# Patient Record
Sex: Male | Born: 1965
Health system: Southern US, Community
[De-identification: ages and names within clinical notes are randomized; demographics above are authoritative.]

## PROBLEM LIST (undated history)

## (undated) DIAGNOSIS — R609 Edema, unspecified: Secondary | ICD-10-CM

## (undated) DIAGNOSIS — C4491 Basal cell carcinoma of skin, unspecified: Secondary | ICD-10-CM

## (undated) DIAGNOSIS — IMO0002 Reserved for concepts with insufficient information to code with codable children: Secondary | ICD-10-CM

## (undated) DIAGNOSIS — E119 Type 2 diabetes mellitus without complications: Secondary | ICD-10-CM

## (undated) DIAGNOSIS — I10 Essential (primary) hypertension: Secondary | ICD-10-CM

## (undated) HISTORY — DX: Basal cell carcinoma of skin, unspecified: C44.91

## (undated) HISTORY — DX: Essential (primary) hypertension: I10

## (undated) HISTORY — DX: Edema, unspecified: R60.9

## (undated) HISTORY — PX: OTHER SURGICAL HISTORY: SHX169

## (undated) HISTORY — DX: Reserved for concepts with insufficient information to code with codable children: IMO0002

---

## 2008-02-15 ENCOUNTER — Ambulatory Visit: Payer: Self-pay | Admitting: Internal Medicine

## 2008-11-26 ENCOUNTER — Ambulatory Visit: Payer: Self-pay | Admitting: Internal Medicine

## 2009-05-30 ENCOUNTER — Ambulatory Visit: Payer: Self-pay | Admitting: Internal Medicine

## 2009-10-03 ENCOUNTER — Ambulatory Visit: Payer: Self-pay | Admitting: Internal Medicine

## 2010-11-09 ENCOUNTER — Encounter: Payer: Self-pay | Admitting: Internal Medicine

## 2010-11-09 ENCOUNTER — Ambulatory Visit (INDEPENDENT_AMBULATORY_CARE_PROVIDER_SITE_OTHER): Payer: Commercial Managed Care - PPO | Admitting: Internal Medicine

## 2010-11-09 DIAGNOSIS — I119 Hypertensive heart disease without heart failure: Secondary | ICD-10-CM | POA: Insufficient documentation

## 2010-11-09 DIAGNOSIS — I10 Essential (primary) hypertension: Secondary | ICD-10-CM

## 2010-11-09 DIAGNOSIS — R079 Chest pain, unspecified: Secondary | ICD-10-CM

## 2010-11-09 DIAGNOSIS — R609 Edema, unspecified: Secondary | ICD-10-CM

## 2010-11-09 NOTE — Progress Notes (Signed)
Subjective:    Patient ID: Kyle Dawson, male    DOB: 05-18-1965, 45 y.o.   MRN: [id number]  HPI  pleasant 45 year old white male with strong family history of coronary disease in today with complaint of chest pain. 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 Has noticed some tenderness in left parasternal area. Seemed to resolve after a few minutes. No radiation to neck or down or. Says it does hurt some in left scapula. Some diaphoresis with these attacks. Last night was awakened with an attack of chest pain. Says he was diaphoretic without attack as well. No prior history of GE reflux. Has been having considerable lower extremity edema of the past few months has been wearing compression stockings. History of hypertension diagnosed 1995. Recovering alcohol and drug addict. A Cardiolite exam 2007 at [facility] which was negative. Currently takes been benazepril 40 mg daily and Norvasc 10 mg daily. Blood pressure under good control with this regimen. Suspect hot weather, dose of Norvasc, and standing on his feet about 70 hours a week have aggravated the dependent edema. He's been having edema for a few months. 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. Although he's doing well in school, I think there is some  anxiety associated with his rotations. Has about another year left before he gets his diploma for [employer] Recently has been told he has to go on to school and get a bachelors degree if he wants to work for  [employer] system. Denies water brash or reflux symptoms. Says he snores a lot and suspects he has sleep apnea. Does not want to do sleep study at this point in time. Says he has gained weight over the past year. Previous weight here May 2011 was 225 pounds so he's gained 13 pounds in the past year.  Fasting lab work done June 2011 was within normal limits with the exception of triglycerides of 176 and an LDL cholesterol of 108. BUN and creatinine normal at that time. Had another episode of chest pain today while in school. Pain was substernal in nature. Had diaphoresis associated with it. Lasted some 5-10 minutes. Spontaneously resolved.    Review of Systems     Objective:   Physical Exam  Skin: warm and dry; Nodes: none; Neck: supple, no thyromegaly, no bruits, no adenopathy. Tender left parasternal area along adjacent rib. Abd: no hepatosplenomegaly masses or tenderness. Ext:  2+ dorsalis pedis pulses,  trace to 1+ pitting edema both lower extremities. EKG shows no acute changes from EKG taken in this office in 2011 but considerable artifact.        Assessment & Plan:  Chest pain-suspect element of chest wall pain but with strong family history cannot exclude occult coronary artery disease. Had negative Cardiolite study in [facility] 2007. Report on file in the office. Refer to cardiologist for evaluation since she's had 3 episodes in the past 24 hours. Pain awakened him from sleep last night.? GE reflux. Patient does not want to start PPI at this point in time prefers to wait for cardiology evaluation. He is to have fasting lab work including CBC C-met, hemoglobin A1c, fasting lipid panel  Hypertension control has been good no benazepril and Norvasc. However with increased time spent on his feet he has developed considerable dependent edema. 10  mg dose of Norvasc may well be aggravating the fluid retention. Hot weather could be causing edema issues in addition to his diet.Admits to not eating healthy recently while in school.  Dependent edema-discussed above start Lasix 20 mg daily. Recheck potassium one week after starting Lasix. May need to be increased to 40 mg daily. May need to be taken off of Norvasc entirely and placed on something else do to edema issues.  Await cardiology evaluation and recommendations.  Anxiety about school and chest pain. Does not want anti-anxiety medication.

## 2010-11-09 NOTE — Patient Instructions (Signed)
Please start Lasix tomorrow as corrected 20 mg daily in addition to Norvasc and benazepril. We will arrange cardiology consultation for you. Please try to take it easy. Understanding will have a break from school after next week for 5 weeks. Please try to improve diet. Please try to keep feet elevated as much as  possible. Fasting blood work has been scheduled.

## 2010-11-10 ENCOUNTER — Telehealth: Payer: Self-pay | Admitting: *Deleted

## 2010-11-10 NOTE — Telephone Encounter (Signed)
[place] regarding a possible appointment this week for a Cardiologist to evaluate recent episodes of chest pain.  Records faxed over to [facility] for Cardiologist to evaluate.  Their office called back and stated this was not an urgent matter and they would call the patient to set up an appointment later this month.  MD aware.

## 2010-11-12 ENCOUNTER — Other Ambulatory Visit: Payer: Self-pay | Admitting: Internal Medicine

## 2010-11-12 ENCOUNTER — Other Ambulatory Visit: Payer: Commercial Managed Care - PPO | Admitting: Internal Medicine

## 2010-11-12 DIAGNOSIS — R5383 Other fatigue: Secondary | ICD-10-CM

## 2010-11-12 DIAGNOSIS — I871 Compression of vein: Secondary | ICD-10-CM

## 2010-11-12 DIAGNOSIS — R079 Chest pain, unspecified: Secondary | ICD-10-CM

## 2010-11-12 DIAGNOSIS — I10 Essential (primary) hypertension: Secondary | ICD-10-CM

## 2010-11-12 DIAGNOSIS — R5381 Other malaise: Secondary | ICD-10-CM

## 2010-11-13 ENCOUNTER — Telehealth: Payer: Self-pay | Admitting: Internal Medicine

## 2010-11-13 ENCOUNTER — Encounter: Payer: Self-pay | Admitting: Internal Medicine

## 2010-11-13 LAB — T4, FREE: Free T4: 1.3 ng/dL (ref 0.80–1.80)

## 2010-11-13 LAB — CBC WITH DIFFERENTIAL/PLATELET
Basophils Absolute: 0 10*3/uL (ref 0.0–0.1)
Basophils Relative: 1 % (ref 0–1)
HCT: 45.1 % (ref 39.0–52.0)
MCHC: 33.9 g/dL (ref 30.0–36.0)
Monocytes Absolute: 0.6 10*3/uL (ref 0.1–1.0)
Neutro Abs: 3.8 10*3/uL (ref 1.7–7.7)
Platelets: 251 10*3/uL (ref 150–400)
RDW: 12.7 % (ref 11.5–15.5)

## 2010-11-13 NOTE — Telephone Encounter (Signed)
Pt will call to schedule return appt in 1 month.

## 2010-11-17 ENCOUNTER — Other Ambulatory Visit: Payer: Commercial Managed Care - PPO | Admitting: Internal Medicine

## 2010-11-18 ENCOUNTER — Encounter: Payer: Self-pay | Admitting: Cardiology

## 2010-11-19 ENCOUNTER — Ambulatory Visit: Payer: Commercial Managed Care - PPO | Admitting: Cardiology

## 2010-11-19 ENCOUNTER — Encounter: Payer: Self-pay | Admitting: Cardiology

## 2010-11-19 ENCOUNTER — Ambulatory Visit (INDEPENDENT_AMBULATORY_CARE_PROVIDER_SITE_OTHER): Payer: Commercial Managed Care - PPO | Admitting: Cardiology

## 2010-11-19 VITALS — BP 140/90 | HR 72 | Resp 16 | Ht 70.0 in | Wt 251.0 lb

## 2010-11-19 DIAGNOSIS — R079 Chest pain, unspecified: Secondary | ICD-10-CM

## 2010-11-19 DIAGNOSIS — E663 Overweight: Secondary | ICD-10-CM

## 2010-11-19 DIAGNOSIS — I10 Essential (primary) hypertension: Secondary | ICD-10-CM

## 2010-11-19 DIAGNOSIS — I2510 Atherosclerotic heart disease of native coronary artery without angina pectoris: Secondary | ICD-10-CM

## 2010-11-19 DIAGNOSIS — R609 Edema, unspecified: Secondary | ICD-10-CM

## 2010-11-19 NOTE — Patient Instructions (Signed)
Your physician has requested that you have an exercise tolerance test. For further information please visit [email or web address]. Please also follow instruction sheet, as given.  Please continue all medications as listed.

## 2010-11-19 NOTE — Progress Notes (Signed)
HPI The patient presents for evaluation of chest discomfort. He has a history of chest discomfort and did have a stress perfusion study in 1997 that demonstrated no evidence of ischemia or infarct.  He reports chest discomfort occurring for about one year. It happens sporadically. It may happen several times over 2-3 days and then disappear. It may last for 5 minutes. It may be 8/10 in intensity.  It is substernal and radiates around under the left breast.  It comes and goes spontaneously. It is not made worse by activity and he cannot bring it on. He does get some diaphoresis with it. He does state it is happening more frequently. However, he can exert himself significantly without this discomfort.  Allergies  Allergen Reactions  . Oxycodone     Current Outpatient Prescriptions  Medication Sig Dispense Refill  . 5-Hydroxytryptophan (5-HTP PO) Take by mouth daily.        . ALPHA-LIPOIC ACID PO Take by mouth daily.        Marland Kitchen amLODipine (NORVASC) 10 MG tablet Take 10 mg by mouth daily.        Marland Kitchen aspirin 81 MG tablet Take 81 mg by mouth daily.        . B Complex-C (B-50 COMPLEX/VITAMIN C) TABS Take 2 tablets by mouth 2 (two) times daily.        . benazepril (LOTENSIN) 40 MG tablet Take 40 mg by mouth daily.        . Calcium Carbonate-Vit D-Min (CALCIUM 1200 PO) Take by mouth daily.        . cholecalciferol (VITAMIN D) 1000 UNITS tablet Take 1,000 Units by mouth daily.        Marland Kitchen co-enzyme Q-10 30 MG capsule Take 100 mg by mouth 2 (two) times daily.        . fish oil-omega-3 fatty acids 1000 MG capsule Take 2 g by mouth daily.        . furosemide (LASIX) 20 MG tablet Take 20 mg by mouth daily.        Marland Kitchen L-LYSINE PO Take by mouth 2 (two) times daily.        . LevOCARNitine (CARNITINE PO) Take by mouth 2 (two) times daily.        . Lutein 20 MG TABS Take by mouth daily.        . Saw Palmetto, Serenoa repens, 450 MG CAPS Take by mouth daily.        . Selenium 100 MCG CAPS Take by mouth daily.        Marland Kitchen  VITAMIN A PO Take by mouth daily.        . vitamin E 400 UNIT capsule Take 400 Units by mouth daily.        . Zinc 50 MG TABS Take 2 tablets by mouth 2 (two) times daily.          Past Medical History  Diagnosis Date  . Hypertension   . Basal cell carcinoma   . Squamous cell carcinoma     R ear, nose, each side of face  . Chest pain   . Edema     lower extremity    Past Surgical History  Procedure Date  . Removal basal cell carcinoma   . Removal squamous cell carcinoma     Family History  Problem Relation Age of Onset  . Peripheral vascular disease Mother 75  . Hypertension Mother     History   Social History  . Marital  Status: Single    Spouse Name: N/A    Number of Children: N/A  . Years of Education: N/A   Occupational History  . [employer]   Social History Main Topics  . Smoking status: Never Smoker   . Smokeless tobacco: Not on file  . Alcohol Use: No     Quit drinking 2007  . Drug Use: Not on file  . Sexually Active: Not on file   Other Topics Concern  . Not on file   Social History Narrative  . No narrative on file    ROS: GERD.  Otherwise as stated in the HPI and negative for all other systems.   PHYSICAL EXAM BP 140/90  Pulse 72  Resp 16  Ht 5' 10"  (1.778 m)  Wt 251 lb (113.853 kg)  BMI 36.01 kg/m2 GENERAL:  Well appearing HEENT:  Pupils equal round and reactive, fundi not visualized, oral mucosa unremarkable NECK:  No jugular venous distention, waveform within normal limits, carotid upstroke brisk and symmetric, no bruits, no thyromegaly LYMPHATICS:  No cervical, inguinal adenopathy LUNGS:  Clear to auscultation bilaterally BACK:  No CVA tenderness CHEST:  Unremarkable HEART:  PMI not displaced or sustained,S1 and S2 within normal limits, no S3, no S4, no clicks, no rubs, no murmurs ABD:  Flat, positive bowel sounds normal in frequency in pitch, no bruits, no rebound, no guarding, no midline pulsatile mass, no hepatomegaly, no  splenomegaly EXT:  2 plus pulses throughout, no edema, no cyanosis no clubbing SKIN:  No rashes no nodules NEURO:  Cranial nerves II through XII grossly intact, motor grossly intact throughout [facility]:  Cognitively intact, oriented to person place and time   EKG:  11/09/10  Sinus rhythm, rate 71, axis within normal limits, intervals within normal limits, early transition in lead V2, no acute ST-T wave changes  ASSESSMENT AND PLAN

## 2010-11-19 NOTE — Assessment & Plan Note (Signed)
The patient's chest pain has atypical greater than typical features. However, he does have some risk factors. At this point I will bring him back in exercise treadmill test. A POET (Plain Old Exercise Test) will allow me to screen for obstructive coronary disease, risk stratify and very importantly provide a prescription for exercise.

## 2010-11-19 NOTE — Assessment & Plan Note (Signed)
The blood pressure is at target. No change in medications is indicated. We will continue with therapeutic lifestyle changes (TLC).  

## 2010-11-19 NOTE — Assessment & Plan Note (Signed)
We discussed conservative therapy for management of this.

## 2010-11-19 NOTE — Assessment & Plan Note (Signed)
He has lost some weight and I applaud this and encourage more of the same.

## 2010-12-14 ENCOUNTER — Ambulatory Visit: Payer: Commercial Managed Care - PPO | Admitting: Internal Medicine

## 2010-12-15 ENCOUNTER — Encounter: Payer: Self-pay | Admitting: Internal Medicine

## 2010-12-15 ENCOUNTER — Ambulatory Visit (INDEPENDENT_AMBULATORY_CARE_PROVIDER_SITE_OTHER): Payer: Commercial Managed Care - PPO | Admitting: Internal Medicine

## 2010-12-15 VITALS — BP 122/70 | HR 72 | Temp 97.6°F | Ht 70.51 in | Wt 238.0 lb

## 2010-12-15 DIAGNOSIS — R0789 Other chest pain: Secondary | ICD-10-CM

## 2010-12-15 DIAGNOSIS — R609 Edema, unspecified: Secondary | ICD-10-CM

## 2010-12-15 DIAGNOSIS — I10 Essential (primary) hypertension: Secondary | ICD-10-CM

## 2010-12-15 NOTE — Progress Notes (Signed)
  Subjective:    Patient ID: Kyle Dawson, male    DOB: 23-Aug-1965, 45 y.o.   MRN: [id number]  HPI  45 year old white male with history of hypertension and dependent edema for followup of atypical chest pain and edema. He's been wearing some compression stockings and his edema has improved considerably. 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 He still works a lot of hours at [employer] however. He is scheduled to have a stress test at [facility] next week. Since I saw him he had an episode of chest 2 or 3 weeks ago sitting at home working on the computer. Described the pain as substernal burning radiating  just under left breast into his left axilla. Pain was not associated with waterbrash or burping. No diaphoresis. He got up and went to the shower. Says shower usually makes him feel better. He says the pain lasted for about 4 minutes and resolved. No further episodes. He's not taking any medication for reflux. Blood pressures under adequate control. Seems less stressed than when I saw him initially for chest pain. He needs to continue to diet exercise and lose weight. This will be difficult while trying to work full-time and going to school    Review of Systems     Objective:   Physical Exam  chest clear to auscultation; cardiac exam regular rate and rhythm, normal S1 and S2; extremities trace lower extremity edema        Assessment & Plan:  Hypertension  Atypical chest pain  Patient is to return here in 6 months for office visit and blood pressure check with followup on edema. If edema gets worse once school starts again, he is to contact me. Encouraged diet exercise and weight loss. Avoid fast food during school which was a problem last semester.

## 2010-12-22 ENCOUNTER — Ambulatory Visit (INDEPENDENT_AMBULATORY_CARE_PROVIDER_SITE_OTHER): Payer: Commercial Managed Care - PPO | Admitting: Cardiology

## 2010-12-22 ENCOUNTER — Encounter: Payer: Commercial Managed Care - PPO | Admitting: Cardiology

## 2010-12-22 DIAGNOSIS — R079 Chest pain, unspecified: Secondary | ICD-10-CM

## 2010-12-22 NOTE — Progress Notes (Signed)
Exercise Treadmill Test  Pre-Exercise Testing Evaluation Rhythm: normal sinus  Rate: 90   PR:  .12 QRS:  .09  QT:  .36 QTc: .45     Test  Exercise Tolerance Test Ordering MD: Marijo File, MD  Interpreting MD:  Marijo File, MD  Unique Test No: 1  Treadmill:  1  Indication for ETT: chest pain - rule out ischemia  Contraindication to ETT: No   Stress Modality: exercise - treadmill  Cardiac Imaging Performed: non   Protocol: standard Bruce - maximal  Max BP:  205/93  Max MPHR (bpm):  173 85% MPR (bpm):  147  MPHR obtained (bpm): 172 % MPHR obtained: 99  Reached 85% MPHR (min:sec):  5:09 Total Exercise Time (min-sec): 8:00  Workload in METS:  10.4 Borg Scale:15  Reason ETT Terminated:  desired heart rate attained    ST Segment Analysis At Rest: normal ST segments - no evidence of significant ST depression With Exercise: no evidence of significant ST depression  Other Information Arrhythmia:  Yes Angina during ETT:  absent (0) Quality of ETT:  diagnostic  ETT Interpretation:  normal - no evidence of ischemia by ST analysis  Comments: The patient had a moderate exercise tolerance.  There was no chest pain.  There was an appropriate level of dyspnea.  There was normal heart rate response and normal BP response.  He had PACs. There were no ischemic ST T wave changes. He had an abnormal heart rate recovery.  Recommendations: Negative adequate ETT.  No further testing is indicated.  Based on the above I gave the patient a prescription for exercise.

## 2011-01-08 ENCOUNTER — Other Ambulatory Visit: Payer: Self-pay | Admitting: Internal Medicine

## 2011-06-21 ENCOUNTER — Ambulatory Visit (INDEPENDENT_AMBULATORY_CARE_PROVIDER_SITE_OTHER): Payer: 59 | Admitting: Internal Medicine

## 2011-06-21 ENCOUNTER — Encounter: Payer: Self-pay | Admitting: Internal Medicine

## 2011-06-21 VITALS — BP 136/92 | HR 72 | Temp 98.0°F | Wt 242.0 lb

## 2011-06-21 DIAGNOSIS — I10 Essential (primary) hypertension: Secondary | ICD-10-CM

## 2011-06-21 DIAGNOSIS — Z85828 Personal history of other malignant neoplasm of skin: Secondary | ICD-10-CM

## 2011-06-21 DIAGNOSIS — Z872 Personal history of diseases of the skin and subcutaneous tissue: Secondary | ICD-10-CM

## 2011-06-21 DIAGNOSIS — Z86018 Personal history of other benign neoplasm: Secondary | ICD-10-CM

## 2011-06-21 LAB — BASIC METABOLIC PANEL
CO2: 27 mEq/L (ref 19–32)
Calcium: 10.2 mg/dL (ref 8.4–10.5)
Sodium: 139 mEq/L (ref 135–145)

## 2011-07-10 ENCOUNTER — Encounter: Payer: Self-pay | Admitting: Internal Medicine

## 2011-07-10 DIAGNOSIS — Z85828 Personal history of other malignant neoplasm of skin: Secondary | ICD-10-CM | POA: Insufficient documentation

## 2011-07-10 DIAGNOSIS — Z86018 Personal history of other benign neoplasm: Secondary | ICD-10-CM | POA: Insufficient documentation

## 2011-07-10 NOTE — Progress Notes (Signed)
  Subjective:    Patient ID: Kyle Dawson, male    DOB: [birth year], 46 y.o.   MRN: [id number]  HPI 46 year old white male with history of hypertension treated with benazepril and Norvasc. He is doing very well. Less dependent edema that he had several months ago. Has tried to eat healthy and watch salt intake. Was seen by Dr. Percival Spanish July 2012 for evaluation of chest pain which was thought to be atypical. This is a six-month recheck appointment. Patient quit drinking alcohol in 2007. Says he's recovering from alcoholism and drug use. 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 History of squamous cell carcinoma right ear, history of basal cell carcinoma back and nose. Was diagnosed in 1995 with hypertension. Had Cardiolite study 2007. Last tetanus immunization April 2010. 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  In 2008 Dr. Allyn Kenner removed a junctional and lentiginous dysplastic nevus with moderate to severe atypia from his back.  Gets annual influenza immunization through the hospital. Had negative PPD March 2011. Had TD at vaccine through employee health 08/23/2008. Positive MMR titer. Received aerosol vaccine April 2011. Positive hep B titer April 2010.    Review of Systems     Objective:   Physical Exam  chest clear to auscultation; cardiac exam regular rate and rhythm; trace lower extremity edema. Neck is supple without JVD thyromegaly or carotid bruits.       Assessment & Plan:  Hypertension  History of basal cell carcinoma on back and 2 places on nose  History of squamous cell carcinoma right ear  History of dysplastic nevus on back  History of dependent edema  Plan: Patient is to return in 6 months for physical examination. Continue same medications.

## 2011-07-10 NOTE — Patient Instructions (Signed)
Continue same medications. Return in 6 months for physical exam.

## 2011-12-31 ENCOUNTER — Other Ambulatory Visit: Payer: Self-pay | Admitting: Internal Medicine

## 2011-12-31 ENCOUNTER — Other Ambulatory Visit: Payer: Self-pay

## 2011-12-31 MED ORDER — BENAZEPRIL HCL 40 MG PO TABS: 40.0000 mg | ORAL_TABLET | Freq: Every day | ORAL | Status: DC

## 2011-12-31 MED ORDER — AMLODIPINE BESYLATE 10 MG PO TABS: 10.0000 mg | ORAL_TABLET | Freq: Every day | ORAL | Status: DC

## 2011-12-31 NOTE — Telephone Encounter (Signed)
When is he due for Appt?

## 2012-01-14 ENCOUNTER — Other Ambulatory Visit: Payer: 59 | Admitting: Internal Medicine

## 2012-01-17 ENCOUNTER — Encounter: Payer: 59 | Admitting: Internal Medicine

## 2012-02-17 ENCOUNTER — Other Ambulatory Visit: Payer: 59 | Admitting: Internal Medicine

## 2012-02-17 ENCOUNTER — Encounter: Payer: Self-pay | Admitting: Internal Medicine

## 2012-02-17 ENCOUNTER — Ambulatory Visit (INDEPENDENT_AMBULATORY_CARE_PROVIDER_SITE_OTHER): Payer: 59 | Admitting: Internal Medicine

## 2012-02-17 VITALS — BP 124/72 | HR 80 | Temp 98.4°F | Ht 69.25 in | Wt 237.0 lb

## 2012-02-17 DIAGNOSIS — I10 Essential (primary) hypertension: Secondary | ICD-10-CM

## 2012-02-17 DIAGNOSIS — E669 Obesity, unspecified: Secondary | ICD-10-CM

## 2012-02-17 DIAGNOSIS — R609 Edema, unspecified: Secondary | ICD-10-CM

## 2012-02-17 LAB — CBC WITH DIFFERENTIAL/PLATELET
HCT: 46.8 % (ref 39.0–52.0)
Hemoglobin: 16.6 g/dL (ref 13.0–17.0)
Lymphocytes Relative: 25 % (ref 12–46)
Lymphs Abs: 1.6 10*3/uL (ref 0.7–4.0)
MCHC: 35.5 g/dL (ref 30.0–36.0)
Monocytes Absolute: 0.6 10*3/uL (ref 0.1–1.0)
Monocytes Relative: 8 % (ref 3–12)
Neutro Abs: 4.3 10*3/uL (ref 1.7–7.7)
Neutrophils Relative %: 64 % (ref 43–77)
RBC: 5.3 MIL/uL (ref 4.22–5.81)
WBC: 6.7 10*3/uL (ref 4.0–10.5)

## 2012-02-17 LAB — LIPID PANEL
HDL: 47 mg/dL (ref >39)
LDL Cholesterol: 109 mg/dL — ABNORMAL HIGH (ref 0–99)
Triglycerides: 110 mg/dL (ref <150)

## 2012-02-17 LAB — COMPREHENSIVE METABOLIC PANEL
Albumin: 4.5 g/dL (ref 3.5–5.2)
BUN: 13 mg/dL (ref 6–23)
CO2: 30 mEq/L (ref 19–32)
Calcium: 9.8 mg/dL (ref 8.4–10.5)
Chloride: 101 mEq/L (ref 96–112)
Glucose, Bld: 98 mg/dL (ref 70–99)
Potassium: 4.1 mEq/L (ref 3.5–5.3)
Sodium: 140 mEq/L (ref 135–145)
Total Protein: 7.1 g/dL (ref 6.0–8.3)

## 2012-02-17 NOTE — Progress Notes (Signed)
Subjective:    Patient ID: Kyle Dawson, male    DOB: 1965/12/05, 46 y.o.   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 History of hypertension diagnosed around 5. He has been on Lotrel 10/40 daily since around 2004. He was in the [employer] for 8 years. His father was a Pharmacist, hospital and pt lived in [place] as a child and also in [place]. While in the [employer] he was in [place]. 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 He is fluent in [place]. He is recovering alcoholic and drug addict. He says he abused alcohol and marijuana. He formerly resided in [place] until approximately 2007. 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 Lives with girlfriend who is a former Nurse, children's who gives private Education officer, environmental.  Intolerant of oxycodone as it causes adverse reactions. Has never been married. No children.  History of several squamous cell carcinomas that have been treated in the past. The most serious ones involved his right ear, nose and one on each side of his face.  Had tetanus immunization in 2006. Cardiolite study in 2007.  Family history: Mother side of family has history of heart disease and hypertension. Mother with history of hypertension and has had a carotid endarterectomy. Patient says family members on his father side lived to be fairly old.  Patient presented with chest pain July 2012 and had cardiac evaluation here in [place] which was negative. He had an issue at that time the dependent edema related to being on his feet a lot with school and working in the emergency department at [employer].  He completed the hep B series.  Varicella vaccine in 2011. In influenza and tetanus immunization through employment at [facility] [employer].    Review of Systems    Constitutional: Negative.   HENT: Negative.   Eyes: Negative.   Respiratory: Negative.   Cardiovascular: Negative.   Endocrine: Negative.   Musculoskeletal:       Lower extremity edema improved  Allergic/Immunologic: Negative.   Neurological: Negative.   Hematological: Negative.   Psychiatric/Behavioral: Negative.        Objective:   Physical Exam  Vitals reviewed. Constitutional: He is oriented to person, place, and time. He appears well-developed and well-nourished. No distress.  HENT:  Head: Normocephalic and atraumatic.  Right Ear: External ear normal.  Left Ear: External ear normal.  Nose: Nose normal.  Mouth/Throat: Oropharynx is clear and moist. No oropharyngeal exudate.  Eyes: Conjunctivae normal and EOM are normal. Pupils are equal, round, and reactive to light. Right eye exhibits no discharge. Left eye exhibits no discharge. No scleral icterus.  Neck: Neck supple. No JVD present. No thyromegaly present.  Cardiovascular: Normal rate, regular rhythm, normal heart sounds and intact distal pulses.   No murmur heard. Pulmonary/Chest: Effort normal and breath sounds normal. He has no wheezes. He has no rales.  Abdominal: Soft. Bowel sounds are normal. He exhibits no distension and no mass. There is no tenderness. There is no rebound and no guarding.  Genitourinary: Prostate normal.  Musculoskeletal: Normal range of motion. He exhibits no edema.  Lymphadenopathy:    He has no cervical adenopathy.  Neurological: He is alert and oriented to person, place, and time. He has normal reflexes. He displays normal reflexes. No cranial nerve  deficit. Coordination normal.  Skin: Skin is warm and dry. No rash noted. He is not diaphoretic.  Psychiatric: He has a normal mood and affect. His behavior is normal. Judgment and thought content normal.          Assessment & Plan:  Essential hypertension-stable on current regimen  History of squamous cell skin carcinomas  History of  lower extremity edema-improved  Plan: Patient is to return in 6-12 months or as needed. No change in medication.

## 2012-02-17 NOTE — Patient Instructions (Addendum)
Continue same medications and return in 6 months. Get influenza immunization at work.

## 2012-07-16 ENCOUNTER — Encounter: Payer: Self-pay | Admitting: Internal Medicine

## 2012-08-15 ENCOUNTER — Encounter: Payer: Self-pay | Admitting: Internal Medicine

## 2012-08-21 ENCOUNTER — Other Ambulatory Visit: Payer: Self-pay | Admitting: Internal Medicine

## 2012-08-21 DIAGNOSIS — E785 Hyperlipidemia, unspecified: Secondary | ICD-10-CM

## 2012-08-21 DIAGNOSIS — Z79899 Other long term (current) drug therapy: Secondary | ICD-10-CM

## 2012-08-21 DIAGNOSIS — R252 Cramp and spasm: Secondary | ICD-10-CM

## 2012-08-21 LAB — LIPID PANEL
HDL: 51 mg/dL (ref >39)
Total CHOL/HDL Ratio: 3.3 Ratio
VLDL: 16 mg/dL (ref 0–40)

## 2012-08-21 LAB — HEPATIC FUNCTION PANEL
ALT: 33 U/L (ref 0–53)
AST: 19 U/L (ref 0–37)
Albumin: 4.2 g/dL (ref 3.5–5.2)
Alkaline Phosphatase: 45 U/L (ref 39–117)
Indirect Bilirubin: 0.6 mg/dL (ref 0.0–0.9)
Total Protein: 6.5 g/dL (ref 6.0–8.3)

## 2012-08-22 ENCOUNTER — Encounter: Payer: Self-pay | Admitting: Internal Medicine

## 2012-08-22 ENCOUNTER — Ambulatory Visit (INDEPENDENT_AMBULATORY_CARE_PROVIDER_SITE_OTHER): Payer: 59 | Admitting: Internal Medicine

## 2012-08-22 VITALS — BP 130/86 | Temp 98.3°F | Wt 242.0 lb

## 2012-08-22 DIAGNOSIS — I10 Essential (primary) hypertension: Secondary | ICD-10-CM

## 2012-08-22 DIAGNOSIS — R609 Edema, unspecified: Secondary | ICD-10-CM

## 2012-08-22 DIAGNOSIS — R252 Cramp and spasm: Secondary | ICD-10-CM

## 2012-08-22 DIAGNOSIS — E669 Obesity, unspecified: Secondary | ICD-10-CM

## 2012-08-22 NOTE — Patient Instructions (Addendum)
Continue same medications and return in 6 months. Leg cramps are benign.

## 2012-08-22 NOTE — Progress Notes (Signed)
  Subjective:    Patient ID: KYLAND NO, male    DOB: 1965-05-21, 47 y.o.   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 He is on Lotensin. He has Lasix to take for dependent edema but he seldom takes it. Fasting lipid panel is within normal limits. Patient as the d-dimer be drawn because he been having some pain in his left lower extremity at times which she describes as cramping. Initially started as cramping in his foot and then moved to his lower leg and in his upper thigh. D-dimer is negative. Patient remains overweight. Needs to diet and exercise.   Review of Systems     Objective:   Physical Exam skin is warm and dry. Neck is supple without thyromegaly JVD or carotid bruits. Chest clear to auscultation. Cardiac exam regular rate and rhythm normal S1 and S2. Extremities without pitting edema.        Assessment & Plan:  Hypertension  History of dependent edema-takes Lasix sparingly  LDL at last visit was 109 and is now within normal limits.  Obesity  Leg cramps-explain these were musculoskeletal in nature  Plan: Return in 6 months for physical examination. Continue same medications. Does not need to be on daily Lasix

## 2013-02-22 ENCOUNTER — Other Ambulatory Visit: Payer: 59 | Admitting: Internal Medicine

## 2013-02-22 DIAGNOSIS — I10 Essential (primary) hypertension: Secondary | ICD-10-CM

## 2013-02-22 DIAGNOSIS — Z130 Encounter for screening for diseases of the blood and blood-forming organs and certain disorders involving the immune mechanism: Secondary | ICD-10-CM

## 2013-02-22 DIAGNOSIS — Z13220 Encounter for screening for lipoid disorders: Secondary | ICD-10-CM

## 2013-02-22 LAB — COMPREHENSIVE METABOLIC PANEL
AST: 16 U/L (ref 0–37)
Alkaline Phosphatase: 49 U/L (ref 39–117)
BUN: 20 mg/dL (ref 6–23)
Creat: 0.88 mg/dL (ref 0.50–1.35)
Glucose, Bld: 87 mg/dL (ref 70–99)

## 2013-02-22 LAB — LIPID PANEL
Cholesterol: 163 mg/dL (ref 0–200)
LDL Cholesterol: 85 mg/dL (ref 0–99)
Total CHOL/HDL Ratio: 2.9 Ratio
Triglycerides: 109 mg/dL (ref <150)
VLDL: 22 mg/dL (ref 0–40)

## 2013-02-22 LAB — CBC WITH DIFFERENTIAL/PLATELET
Basophils Relative: 1 % (ref 0–1)
HCT: 45.6 % (ref 39.0–52.0)
Hemoglobin: 16.1 g/dL (ref 13.0–17.0)
Lymphs Abs: 2.4 10*3/uL (ref 0.7–4.0)
MCH: 31 pg (ref 26.0–34.0)
MCHC: 35.3 g/dL (ref 30.0–36.0)
Monocytes Absolute: 0.9 10*3/uL (ref 0.1–1.0)
Monocytes Relative: 12 % (ref 3–12)
Neutro Abs: 3.6 10*3/uL (ref 1.7–7.7)
RBC: 5.2 MIL/uL (ref 4.22–5.81)

## 2013-02-23 ENCOUNTER — Encounter: Payer: Self-pay | Admitting: Internal Medicine

## 2013-02-23 ENCOUNTER — Ambulatory Visit (INDEPENDENT_AMBULATORY_CARE_PROVIDER_SITE_OTHER): Payer: 59 | Admitting: Internal Medicine

## 2013-02-23 VITALS — BP 126/82 | HR 84 | Temp 97.8°F | Ht 69.5 in | Wt 248.0 lb

## 2013-02-23 DIAGNOSIS — Z0000 Encounter for general adult medical examination without abnormal findings: Secondary | ICD-10-CM

## 2013-02-23 DIAGNOSIS — R609 Edema, unspecified: Secondary | ICD-10-CM

## 2013-02-23 DIAGNOSIS — E669 Obesity, unspecified: Secondary | ICD-10-CM

## 2013-02-23 DIAGNOSIS — I10 Essential (primary) hypertension: Secondary | ICD-10-CM

## 2013-02-23 DIAGNOSIS — F1021 Alcohol dependence, in remission: Secondary | ICD-10-CM

## 2013-02-23 DIAGNOSIS — Z85828 Personal history of other malignant neoplasm of skin: Secondary | ICD-10-CM

## 2013-02-23 LAB — POCT URINALYSIS DIPSTICK
Blood, UA: NEGATIVE
Glucose, UA: NEGATIVE
Nitrite, UA: NEGATIVE
Protein, UA: NEGATIVE
Spec Grav, UA: 1.015
Urobilinogen, UA: NEGATIVE
pH, UA: 7

## 2013-03-15 ENCOUNTER — Other Ambulatory Visit: Payer: Self-pay

## 2013-04-06 ENCOUNTER — Encounter: Payer: Self-pay | Admitting: Internal Medicine

## 2013-04-06 MED ORDER — BENAZEPRIL HCL 40 MG PO TABS: 40.0000 mg | ORAL_TABLET | Freq: Every day | ORAL | Status: DC

## 2013-04-06 MED ORDER — AMLODIPINE BESYLATE 10 MG PO TABS: 10.0000 mg | ORAL_TABLET | Freq: Every day | ORAL | Status: DC

## 2013-04-06 NOTE — Telephone Encounter (Signed)
Needs refills  to [facility] for antihypertensive meds.

## 2013-07-05 NOTE — Patient Instructions (Signed)
Please try to diet exercise and lose some weight. Continue same medications and return in 6 months

## 2013-07-05 NOTE — Progress Notes (Signed)
Subjective:    Patient ID: Kyle Dawson, male    DOB: 04-16-1966, 48 y.o.   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 History of hypertension. Patient is overweight. Doesn't diet and exercise as much as she should. He has a history of dependent edema but that has improved considerably. History of basal cell carcinoma of the skin, history of squamous cell carcinoma skin, history of dysplastic nevus. Hypertension was diagnosed around 1995. He has been on Lotrel 10/40 daily since 2004. He was in the [employer] for 8 years. While in the [employer], he was in [place] in [place]. He went to [place] to trying personnel for drug worse. He is fluent in [place]. He is a recovering alcoholic and drug addict. Says he abused alcohol and marijuana. He formally resided in [place] until about 2007. 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  Intolerant of oxycodone as it causes adverse reactions.  History of cerebral squamous cell carcinomas involving his right ear, nose and one on each side of his face.  Tetanus immunization in 2006. He has completed the hepatitis B series. Had varus sell a vaccine in 2011. Gets annual influenza immunization through employment at hospital.  Cardiolite study in 2007 which was negative. Patient presented here with chest pain in July 2012. Get cardiac evaluation by cardiologist which was negative. 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   Social history: His father was a Pharmacist, hospital. Patient lived in [place] as a child and also in [place]. He is fluent in [place]. Has never been married. No children.   Family history: Mother side of the family has heart disease and hypertension. Mother with  history of hypertension and has had a carotid endarterectomy. Patient says family members on his father's side have lived to be fairly old.    Review of Systems  Constitutional: Negative.   All other systems reviewed and are negative.       Objective:   Physical Exam  Vitals reviewed. Constitutional: He is oriented to person, place, and time. He appears well-developed and well-nourished. No distress.  HENT:  Head: Normocephalic and atraumatic.  Right Ear: External ear normal.  Left Ear: External ear normal.  Mouth/Throat: Oropharynx is clear and moist. No oropharyngeal exudate.  Eyes: Conjunctivae and EOM are normal. Pupils are equal, round, and reactive to light. Right eye exhibits no discharge. Left eye exhibits no discharge. No scleral icterus.  Neck: Neck supple. No JVD present. No thyromegaly present.  Cardiovascular: Normal rate, regular rhythm, normal heart sounds and intact distal pulses.   No murmur heard. Pulmonary/Chest: Effort normal and breath sounds normal. No respiratory distress. He has no wheezes. He has no rales. He exhibits no tenderness.  Abdominal: Soft. Bowel sounds are normal. He exhibits no distension and no mass. There is no tenderness. There is no rebound and no guarding.  Genitourinary: Prostate normal.  Musculoskeletal: He exhibits no edema.  Lymphadenopathy:    He has no cervical adenopathy.  Neurological: He is alert and oriented to person, place, and time. He has normal reflexes. He displays normal reflexes. No cranial nerve deficit. Coordination normal.  Skin: Skin is warm and dry. No rash noted. He is not diaphoretic.  Psychiatric: He  has a normal mood and affect. His behavior is normal. Judgment and thought content normal.          Assessment & Plan:  Hypertension- well controlled on Benazepril,  Amlodipine, and Lasix  Obesity-needs to diet and exercise  History of dependent edema-not an issue at the present time treated with  Lasix  History of basal cell and squamous cell carcinomas of the skin  Plan: Return in 6 months for office visit blood pressure check. Encouraged diet and exercise.

## 2013-08-28 ENCOUNTER — Encounter: Payer: Self-pay | Admitting: Internal Medicine

## 2013-08-28 ENCOUNTER — Ambulatory Visit (INDEPENDENT_AMBULATORY_CARE_PROVIDER_SITE_OTHER): Payer: 59 | Admitting: Internal Medicine

## 2013-08-28 VITALS — BP 120/78 | HR 76 | Temp 98.5°F | Wt 261.0 lb

## 2013-08-28 DIAGNOSIS — I10 Essential (primary) hypertension: Secondary | ICD-10-CM

## 2013-08-28 DIAGNOSIS — E669 Obesity, unspecified: Secondary | ICD-10-CM

## 2013-08-28 LAB — BASIC METABOLIC PANEL
BUN: 17 mg/dL (ref 6–23)
CHLORIDE: 103 meq/L (ref 96–112)
CO2: 29 meq/L (ref 19–32)
Calcium: 9.7 mg/dL (ref 8.4–10.5)
Creat: 0.92 mg/dL (ref 0.50–1.35)
Glucose, Bld: 99 mg/dL (ref 70–99)
Potassium: 4.1 mEq/L (ref 3.5–5.3)
SODIUM: 140 meq/L (ref 135–145)

## 2013-08-28 NOTE — Progress Notes (Signed)
   Subjective:    Patient ID: Kyle Dawson, male    DOB: December 30, 1965, 48 y.o.   MRN: [id number]  HPI Patient here today for six-month recheck on hypertension. Unfortunately he has gained 13 pounds in the past 6 months. Says he has not been eating all that health in the past few months. Had a stressful situation he was daily with. Work is okay. Doesn't feel that he needs Lasix any longer for dependent edema so he stopped taking it. Blood pressure is excellent today.    Review of Systems     Objective:   Physical Exam skin warm and dry. Nodes none. Chest clear to auscultation. No JVD thyromegaly or carotid bruits. No lower extremity edema. Cardiac exam regular rate and rhythm normal S1 and S2.         Assessment & Plan:  Hypertension-stable on benazepril and amlodipine. Off Lasix.  Dependent edema-currently nonissue  Obesity-this is an issue and he needs to diet and exercise. Weight should be down 40 pounds at least in my opinion.  Plan: Return in 6 months for physical exam. I have asked him to call his insurance coming to see if there any restrictions on where he has his colonoscopy, out-of-pocket cost et [facility]. He'll let me know when he is ready to go have this done.

## 2013-08-28 NOTE — Patient Instructions (Signed)
Please try to diet exercise and lose weight. Continue same medications. Return in 6 months for physical exam. Call your insurance company about colonoscopy and let us know when you're ready to have this done.

## 2013-09-02 ENCOUNTER — Emergency Department (HOSPITAL_COMMUNITY): Payer: 59

## 2013-09-02 ENCOUNTER — Emergency Department (HOSPITAL_COMMUNITY)
Admission: EM | Admit: 2013-09-02 | Discharge: 2013-09-02 | Disposition: A | Discharge status: Home / Self Care | Payer: 59 | Attending: Emergency Medicine | Admitting: Emergency Medicine

## 2013-09-02 ENCOUNTER — Encounter (HOSPITAL_COMMUNITY): Payer: Self-pay | Admitting: Emergency Medicine

## 2013-09-02 DIAGNOSIS — Y9389 Activity, other specified: Secondary | ICD-10-CM | POA: Insufficient documentation

## 2013-09-02 DIAGNOSIS — W208XXA Other cause of strike by thrown, projected or falling object, initial encounter: Secondary | ICD-10-CM | POA: Insufficient documentation

## 2013-09-02 DIAGNOSIS — Y92009 Unspecified place in unspecified non-institutional (private) residence as the place of occurrence of the external cause: Secondary | ICD-10-CM | POA: Insufficient documentation

## 2013-09-02 DIAGNOSIS — S46909A Unspecified injury of unspecified muscle, fascia and tendon at shoulder and upper arm level, unspecified arm, initial encounter: Secondary | ICD-10-CM | POA: Insufficient documentation

## 2013-09-02 DIAGNOSIS — Z7982 Long term (current) use of aspirin: Secondary | ICD-10-CM | POA: Insufficient documentation

## 2013-09-02 DIAGNOSIS — S4980XA Other specified injuries of shoulder and upper arm, unspecified arm, initial encounter: Secondary | ICD-10-CM | POA: Insufficient documentation

## 2013-09-02 DIAGNOSIS — Z79899 Other long term (current) drug therapy: Secondary | ICD-10-CM | POA: Insufficient documentation

## 2013-09-02 DIAGNOSIS — I10 Essential (primary) hypertension: Secondary | ICD-10-CM | POA: Insufficient documentation

## 2013-09-02 DIAGNOSIS — Z85828 Personal history of other malignant neoplasm of skin: Secondary | ICD-10-CM | POA: Insufficient documentation

## 2013-09-02 DIAGNOSIS — S4991XA Unspecified injury of right shoulder and upper arm, initial encounter: Secondary | ICD-10-CM

## 2013-09-02 DIAGNOSIS — X500XXA Overexertion from strenuous movement or load, initial encounter: Secondary | ICD-10-CM | POA: Insufficient documentation

## 2013-09-02 DIAGNOSIS — S20219A Contusion of unspecified front wall of thorax, initial encounter: Secondary | ICD-10-CM | POA: Insufficient documentation

## 2013-09-02 MED ORDER — IBUPROFEN 800 MG PO TABS: 800.0000 mg | ORAL_TABLET | Freq: Three times a day (TID) | ORAL | Status: DC

## 2013-09-02 NOTE — Discharge Instructions (Signed)
Wear sling for shoulder support.  Take motrin as needed for pain. Follow up with [facility]-- call tomorrow to schedule appt. Return to the ED as needed for new or worsening symptoms.

## 2013-09-02 NOTE — ED Provider Notes (Signed)
CSN: [id number]     Arrival date & time 09/02/13  1156 History   First MD Initiated Contact with Patient 09/02/13 1203     Chief Complaint  Patient presents with  . Shoulder Injury    right     (Consider location/radiation/quality/duration/timing/severity/associated sxs/prior Treatment) The history is provided by the patient and medical records.   This is a 48 year old male with past medical history significant for hypertension, basal cell carcinoma, large remedy edema, presenting to the ED for right shoulder injury.  Patient states he was moving a to sleep reclining sofa when it fell on him and he had to push it off of him with his right arm. He denies any crush injury. He states while pushing the sofa he felt a tear in his right shoulder. He denies prior shoulder injuries. Pain is worse in the morning, and improves throughout the day with use.  He also complains of some pain in his right lateral ribs, unknown injury.  Rib pain worse with deep breathing, coughing, and twisting motions.  Denies chest pain or SOB.  Past Medical History  Diagnosis Date  . Hypertension   . Basal cell carcinoma   . Squamous cell carcinoma     R ear, nose, each side of face  . Chest pain   . Edema     lower extremity   Past Surgical History  Procedure Laterality Date  . Removal basal cell carcinoma    . Removal squamous cell carcinoma     Family History  Problem Relation Age of Onset  . Peripheral vascular disease Mother 56  . Hypertension Mother    History  Substance Use Topics  . Smoking status: Never Smoker   . Smokeless tobacco: Not on file  . Alcohol Use: No     Comment: Quit drinking 2007    Review of Systems  Musculoskeletal: Positive for arthralgias and myalgias.  All other systems reviewed and are negative.     Allergies  Oxycodone  Home Medications   Prior to Admission medications   Medication Sig Start Date End Date Taking? Authorizing Provider  5-Hydroxytryptophan  (5-HTP PO) Take by mouth daily.      Historical Provider, MD  ALPHA-LIPOIC ACID PO Take by mouth daily.      Historical Provider, MD  amLODipine (NORVASC) 10 MG tablet Take 1 tablet (10 mg total) by mouth daily. 04/06/13   Elby Showers, MD  aspirin 81 MG tablet Take 81 mg by mouth daily.      Historical Provider, MD  B Complex-C (B-50 COMPLEX/VITAMIN C) TABS Take 2 tablets by mouth 2 (two) times daily.      Historical Provider, MD  benazepril (LOTENSIN) 40 MG tablet Take 1 tablet (40 mg total) by mouth daily. 04/06/13   Elby Showers, MD  Calcium Carbonate-Vit D-Min (CALCIUM 1200 PO) Take by mouth daily.      Historical Provider, MD  cholecalciferol (VITAMIN D) 1000 UNITS tablet Take 1,000 Units by mouth daily.      Historical Provider, MD  co-enzyme Q-10 30 MG capsule Take 100 mg by mouth 2 (two) times daily.      Historical Provider, MD  fish oil-omega-3 fatty acids 1000 MG capsule Take 2 g by mouth daily.      Historical Provider, MD  L-LYSINE PO Take by mouth 2 (two) times daily.      Historical Provider, MD  LevOCARNitine (CARNITINE PO) Take by mouth 2 (two) times daily.  Historical Provider, MD  Lutein 20 MG TABS Take by mouth daily.      Historical Provider, MD  Saw Palmetto, Serenoa repens, 450 MG CAPS Take by mouth daily.      Historical Provider, MD  Selenium 100 MCG CAPS Take by mouth daily.      Historical Provider, MD  VITAMIN A PO Take by mouth daily.      Historical Provider, MD  vitamin E 400 UNIT capsule Take 400 Units by mouth daily.      Historical Provider, MD  Zinc 50 MG TABS Take 2 tablets by mouth 2 (two) times daily.      Historical Provider, MD   BP 153/89  Pulse 76  Temp(Src) 98.3 F (36.8 C) (Oral)  Resp 18  Physical Exam  Nursing note and vitals reviewed. Constitutional: He is oriented to person, place, and time. He appears well-developed and well-nourished.  HENT:  Head: Normocephalic and atraumatic.  Mouth/Throat: Oropharynx is clear and moist.  Eyes:  Conjunctivae and EOM are normal. Pupils are equal, round, and reactive to light.  Neck: Normal range of motion.  Cardiovascular: Normal rate, regular rhythm and normal heart sounds.   Pulmonary/Chest: Effort normal and breath sounds normal. No respiratory distress. He has no wheezes. He exhibits tenderness and bony tenderness. He exhibits no edema and no swelling.    Small bruise of right lower lateral ribs, tenderness to palpation, no gross deformity or crepitus  Musculoskeletal:       Right shoulder: He exhibits decreased range of motion, tenderness, bony tenderness and pain. He exhibits no swelling, no effusion, no crepitus, no deformity, no laceration, no spasm, normal pulse and normal strength.  Decreased range of motion in all directions due to pain; mild pain of lateral aspect; positive empty can test against resistance; unable to perform apley scratch maneuver; strong radial pulse and cap refill; sensation intact diffusely throughout arm  Neurological: He is alert and oriented to person, place, and time.  Skin: Skin is warm and dry.  Psychiatric: He has a normal mood and affect.    ED Course  Procedures (including critical care time) Labs Review Labs Reviewed - No data to display  Imaging Review Dg Ribs Unilateral W/chest Right  09/02/2013   CLINICAL DATA:  Trauma, blunt trauma to right side.Dropped a sofa on right side of body today.  EXAM: RIGHT RIBS AND CHEST - 3+ VIEW  COMPARISON:  None.  FINDINGS: Normal cardiac silhouette. No pleural fluid or pulmonary contusion. No pneumothorax. Dedicated views of the right ribs demonstrates no displaced fracture.  IMPRESSION: No radiographic evidence of thoracic trauma.  No rib fracture or pneumothorax.   Electronically Signed   By: Suzy Bouchard M.D.   On: 09/02/2013 13:03     EKG Interpretation None      MDM   Final diagnoses:  Right shoulder injury   X-rays negative for acute rib fracture. Right shoulder is visualized on chest  x-ray, no gross bony deformity or dislocation seen.  Given pain with PROM in all directions, + empty can test, and inability to perform apley scratch test, have concerns for potential rotator cuff injury. Patient placed in arm sling, refer to orthopedics for further evaluation. Patient declined pain medication, requested Motrin only.  Discussed plan with patient, he/she acknowledged understanding and agreed with plan of care.  Return precautions given for new or worsening symptoms.  Larene Pickett, PA-C 09/02/13 [phone]

## 2013-09-02 NOTE — ED Notes (Signed)
Pt is moving to a different house and this Friday he slipped while moving a sofa.  The sofa fell on pt - pinning his left hand/arm.  Pt used his right arm to push sofa off.  Pt felt a tear in his shoulder.  Pt also c/o pain in right side with inspiration, so much so that he is reluctant to take deep breaths.  Pt has bruising along right ribs.  Pt is able to move right arm laterally and across him, but both movements increase pain.  Pulses in RUE are palpable.  Pt has some abrasions to right wrist related to fall.

## 2013-09-04 NOTE — ED Provider Notes (Signed)
Medical screening examination/treatment/procedure(s) were performed by non-physician practitioner and as supervising physician I was immediately available for consultation/collaboration.    Dot Lanes, MD 09/04/13 1106

## 2014-02-25 ENCOUNTER — Other Ambulatory Visit: Payer: 59 | Admitting: Internal Medicine

## 2014-02-26 ENCOUNTER — Encounter: Payer: 59 | Admitting: Internal Medicine

## 2014-03-04 ENCOUNTER — Encounter: Payer: Self-pay | Admitting: Internal Medicine

## 2014-03-07 ENCOUNTER — Other Ambulatory Visit: Payer: 59 | Admitting: Internal Medicine

## 2014-03-07 ENCOUNTER — Other Ambulatory Visit: Payer: Self-pay | Admitting: Internal Medicine

## 2014-03-07 DIAGNOSIS — Z125 Encounter for screening for malignant neoplasm of prostate: Secondary | ICD-10-CM

## 2014-03-07 DIAGNOSIS — I10 Essential (primary) hypertension: Secondary | ICD-10-CM

## 2014-03-07 DIAGNOSIS — Z0000 Encounter for general adult medical examination without abnormal findings: Secondary | ICD-10-CM

## 2014-03-07 DIAGNOSIS — Z130 Encounter for screening for diseases of the blood and blood-forming organs and certain disorders involving the immune mechanism: Secondary | ICD-10-CM

## 2014-03-07 DIAGNOSIS — Z13220 Encounter for screening for lipoid disorders: Secondary | ICD-10-CM

## 2014-03-07 LAB — CBC WITH DIFFERENTIAL/PLATELET
Basophils Absolute: 0.1 10*3/uL (ref 0.0–0.1)
Basophils Relative: 1 % (ref 0–1)
Eosinophils Absolute: 0.4 10*3/uL (ref 0.0–0.7)
Eosinophils Relative: 6 % — ABNORMAL HIGH (ref 0–5)
HEMATOCRIT: 43.5 % (ref 39.0–52.0)
HEMOGLOBIN: 15.3 g/dL (ref 13.0–17.0)
LYMPHS PCT: 31 % (ref 12–46)
Lymphs Abs: 2.2 10*3/uL (ref 0.7–4.0)
MCH: 30.8 pg (ref 26.0–34.0)
MCHC: 35.2 g/dL (ref 30.0–36.0)
MCV: 87.7 fL (ref 78.0–100.0)
MONOS PCT: 12 % (ref 3–12)
Monocytes Absolute: 0.8 10*3/uL (ref 0.1–1.0)
NEUTROS ABS: 3.5 10*3/uL (ref 1.7–7.7)
Neutrophils Relative %: 50 % (ref 43–77)
Platelets: 240 10*3/uL (ref 150–400)
RBC: 4.96 MIL/uL (ref 4.22–5.81)
RDW: 13.4 % (ref 11.5–15.5)
WBC: 7 10*3/uL (ref 4.0–10.5)

## 2014-03-07 LAB — COMPREHENSIVE METABOLIC PANEL
ALBUMIN: 4 g/dL (ref 3.5–5.2)
ALT: 21 U/L (ref 0–53)
AST: 15 U/L (ref 0–37)
Alkaline Phosphatase: 53 U/L (ref 39–117)
BUN: 10 mg/dL (ref 6–23)
CALCIUM: 8.6 mg/dL (ref 8.4–10.5)
CHLORIDE: 105 meq/L (ref 96–112)
CO2: 25 mEq/L (ref 19–32)
Creat: 0.92 mg/dL (ref 0.50–1.35)
GLUCOSE: 134 mg/dL — AB (ref 70–99)
POTASSIUM: 4.1 meq/L (ref 3.5–5.3)
Sodium: 140 mEq/L (ref 135–145)
TOTAL PROTEIN: 6.2 g/dL (ref 6.0–8.3)
Total Bilirubin: 0.6 mg/dL (ref 0.2–1.2)

## 2014-03-07 LAB — LIPID PANEL
Cholesterol: 172 mg/dL (ref 0–200)
HDL: 49 mg/dL (ref >39)
LDL Cholesterol: 106 mg/dL — ABNORMAL HIGH (ref 0–99)
TRIGLYCERIDES: 86 mg/dL (ref <150)
Total CHOL/HDL Ratio: 3.5 Ratio
VLDL: 17 mg/dL (ref 0–40)

## 2014-03-08 LAB — PSA: PSA: 1.82 ng/mL (ref <=4.00)

## 2014-03-11 ENCOUNTER — Encounter: Payer: 59 | Admitting: Internal Medicine

## 2014-03-13 ENCOUNTER — Telehealth: Payer: Self-pay

## 2014-03-13 ENCOUNTER — Ambulatory Visit (INDEPENDENT_AMBULATORY_CARE_PROVIDER_SITE_OTHER): Payer: 59 | Admitting: Internal Medicine

## 2014-03-13 ENCOUNTER — Encounter: Payer: Self-pay | Admitting: Internal Medicine

## 2014-03-13 VITALS — BP 138/88 | HR 81 | Temp 97.8°F | Ht 69.25 in | Wt 260.0 lb

## 2014-03-13 DIAGNOSIS — Z0000 Encounter for general adult medical examination without abnormal findings: Secondary | ICD-10-CM

## 2014-03-13 LAB — POCT URINALYSIS DIPSTICK
BILIRUBIN UA: NEGATIVE
Blood, UA: NEGATIVE
Glucose, UA: NEGATIVE
Ketones, UA: NEGATIVE
LEUKOCYTES UA: NEGATIVE
Nitrite, UA: NEGATIVE
PROTEIN UA: NEGATIVE
Spec Grav, UA: 1.02
Urobilinogen, UA: NEGATIVE
pH, UA: 6

## 2014-03-13 LAB — HEMOGLOBIN A1C
Hgb A1c MFr Bld: 6.7 % — ABNORMAL HIGH (ref <5.7)
Mean Plasma Glucose: 146 mg/dL — ABNORMAL HIGH (ref <117)

## 2014-03-13 NOTE — Telephone Encounter (Signed)
A1c added to patients labs.

## 2014-04-29 ENCOUNTER — Telehealth: Payer: Self-pay | Admitting: *Deleted

## 2014-04-29 NOTE — Telephone Encounter (Signed)
Left message for patient to call regarding FMLA papers received for completion

## 2014-05-02 ENCOUNTER — Telehealth: Payer: Self-pay | Admitting: *Deleted

## 2014-05-02 NOTE — Telephone Encounter (Signed)
Have left several messages at patient home number for him to return call have received FMLA papers and we need more information from patient

## 2014-05-08 NOTE — Telephone Encounter (Signed)
Pt has scheduled appt to review symptoms with Dr Renold Genta

## 2014-05-13 ENCOUNTER — Encounter: Payer: Self-pay | Admitting: Internal Medicine

## 2014-05-13 ENCOUNTER — Ambulatory Visit (INDEPENDENT_AMBULATORY_CARE_PROVIDER_SITE_OTHER): Payer: 59 | Admitting: Internal Medicine

## 2014-05-13 VITALS — BP 132/78 | HR 82 | Temp 98.6°F | Wt 227.0 lb

## 2014-05-13 DIAGNOSIS — F329 Major depressive disorder, single episode, unspecified: Secondary | ICD-10-CM

## 2014-05-13 DIAGNOSIS — F32A Depression, unspecified: Secondary | ICD-10-CM

## 2014-05-13 DIAGNOSIS — R197 Diarrhea, unspecified: Secondary | ICD-10-CM

## 2014-05-13 LAB — HM COLONOSCOPY

## 2014-05-13 MED ORDER — HYOSCYAMINE SULFATE 0.125 MG SL SUBL: SUBLINGUAL_TABLET | SUBLINGUAL | Status: DC

## 2014-05-13 NOTE — Progress Notes (Signed)
   Subjective:    Patient ID: Kyle Dawson, male    DOB: [birth year], 49 y.o.   MRN: [id number]  HPI Patient in today to discuss request for FMLA form completion. Patient says he's had bouts of diarrhea recently. Says he has a prior history of what sounds like irritable bowel syndrome. Multiple episodes of diarrhea on some days. Feels it's related to stress. Says this runs in his family. He is due for colonoscopy and we will put in referral for colonoscopy. Also has had some issues recently with depression. He talked at length about his [employer]. 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 History of probable PTSD. Thinks he may want to make a change. 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 Thinks he may need a new challenge. His been to speak to recruiters about changing positions and also to seek some employee assistance counseling for depression symptoms. He says prior to going to nursing school he used to be involved in yoga. Realizes he's gained weight not taking very good care of himself. Is not exercising. Works 12 hour shifts.  Also having some issues with redness in his eyes. Denies excessive alcohol consumption. Some drainage from his eyes at times. Has seen Dr. Delman Cheadle for this. Opthalmology  medication was not prescribed. Patient says he may want to see another eye physician.  He's also asking about a Social worker. Have recommended [facility].  I spent 45 minutes speaking with patient about these issues and about his history in the [employer] and what he was subjected to at that time. He was in the [employer] for several years. He was born in [place]. Later attended college in [place] but did not finish college. 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 Says he came to [place] because of a male friend. Currently no male friend in his life. He would like to have one in  the future.  His father was in the [place] [employer] patient lived in both [place] in [place] growing up. He is Nurse, learning disability.  With regard to diarrhea, he has no recent travel history. No blood in his stool. No fever or chills.    Review of Systems     Objective:   Physical Exam Not examined. Spent 45 minutes speaking with patient today.       Assessment & Plan:  Depression  Diarrhea-possible irritable bowel syndrome  Plan: Trial of Levsin 0.125 mg sublingual one half hour before meals. Talk with him about possible SSRI medication but he's reluctant to start that if present time. Agrees to return in 3 months for reassessment. Order for colonoscopy placed.

## 2014-05-13 NOTE — Patient Instructions (Signed)
Try Levsin before meals to see if diarrhea improves. Order placed for colonoscopy. FMLA form to be completed. Return in 3 months.

## 2014-05-14 NOTE — Patient Instructions (Signed)
Continue same meds and return in 6 months

## 2014-05-14 NOTE — Progress Notes (Deleted)
Subjective:    Patient ID: Kyle Dawson, male    DOB: Feb 06, 1966, 49 y.o.   MRN: [id number]  HPI 49 year old white male in today for health maintenance and evaluation of medical issues. History of hypertension. 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 History of obesity. History of dysplastic nevus. Had influenza immunization through employment. Due for colonoscopy in the near future. Tetanus immunization is up-to-date having been given in 2010.   Diagnoses     Routine general medical examination at a health care facility - Primary    ICD-9-CM: V70.0 ICD-10-CM: Z00.00    Essential hypertension, benign     ICD-9-CM: 401.1 ICD-10-CM: I10    Obesity, unspecified     ICD-9-CM: 278.00 ICD-10-CM: E66.9    History of skin cancer     ICD-9-CM: V10.83 ICD-10-CM: Z85.828    Dependent edema     ICD-9-CM: 782.3 ICD-10-CM: R60.9    Recovering alcoholic in remission     ICD-9-CM: 303.93 ICD-10-CM: F10.21       Reason for Visit     Hypertension    Annual Exam    Reason for Visit History        Current Vitals  Most recent update: 02/23/2013 11:15 AM by Brett Canales, LPN    BP Pulse Temp(Src) Ht Wt BMI    126/82 mmHg 84 97.8 F (36.6 C) 5' 9.5" (1.765 m) 248 lb (112.492 kg) 36.11 kg/m2       Progress Notes      Elby Showers, MD at 07/05/2013 5:12 PM     Status: Signed       Expand All Collapse All     Subjective:    Patient ID: Kyle Dawson, male DOB: [birth year], 49 y.o. 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 History of hypertension. Patient is overweight. Doesn't diet and exercise as much as she should. He has a history of dependent edema but that has improved considerably. History of  basal cell carcinoma of the skin, history of squamous cell carcinoma skin, history of dysplastic nevus. Hypertension was diagnosed around 1995. He has been on Lotrel 10/40 daily since 2004. He was in the [employer] for 8 years. While in the [employer], he was in [place] in [place]. He went to [place] to trying personnel for drug worse. He is fluent in [place]. He is a recovering alcoholic and drug addict. Says he abused alcohol and marijuana. He formally resided in [place] until about 2007. 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  Intolerant of oxycodone as it causes adverse reactions.  History of cerebral squamous cell carcinomas involving his right ear, nose and one on each side of his face.  Tetanus immunization in 2006. He has completed the hepatitis B series. Had varus sell a vaccine in 2011. Gets annual influenza immunization through employment at hospital.  Cardiolite study in 2007 which was negative. Patient presented here with chest pain in July 2012. Get cardiac evaluation by cardiologist which was negative. 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  Social history: His father was a Pharmacist, hospital. Patient lived in [place] as a child and also in [place]. He is  fluent in [place]. Has never been married. No children.  Family history: Mother side of the family has heart disease and hypertension. Mother with history of hypertension and has had a carotid endarterectomy. Patient says family members on his father's side have lived to be fairly old.    Review of Systems  Constitutional: Negative.  All other systems reviewed and are negative.      Objective:   Physical Exam  Vitals reviewed. Constitutional: He is oriented to person, place, and time. He appears well-developed and well-nourished. No distress.  HENT:  Head: Normocephalic and atraumatic.    Right Ear: External ear normal.  Left Ear: External ear normal.  Mouth/Throat: Oropharynx is clear and moist. No oropharyngeal exudate.  Eyes: Conjunctivae and EOM are normal. Pupils are equal, round, and reactive to light. Right eye exhibits no discharge. Left eye exhibits no discharge. No scleral icterus.  Neck: Neck supple. No JVD present. No thyromegaly present.  Cardiovascular: Normal rate, regular rhythm, normal heart sounds and intact distal pulses.  No murmur heard. Pulmonary/Chest: Effort normal and breath sounds normal. No respiratory distress. He has no wheezes. He has no rales. He exhibits no tenderness.  Abdominal: Soft. Bowel sounds are normal. He exhibits no distension and no mass. There is no tenderness. There is no rebound and no guarding.  Genitourinary: Prostate normal.  Musculoskeletal: He exhibits no edema.  Lymphadenopathy:   He has no cervical adenopathy.  Neurological: He is alert and oriented to person, place, and time. He has normal reflexes. He displays normal reflexes. No cranial nerve deficit. Coordination normal.  Skin: Skin is warm and dry. No rash noted. He is not diaphoretic.  Psychiatric: He has a normal mood and affect. His behavior is normal. Judgment and thought content normal.          Assessment & Plan:  Hypertension- well controlled on Benazepril, Amlodipine, and Lasix  Obesity-needs to diet and exercise  History of dependent edema-not an issue at the present time treated with Lasix  History of basal cell and squamous cell carcinomas of the skin  Plan: Return in 6 months for office visit blood pressure check. Encouraged diet and exercise.             Not recorded        Patient Instructions     Please try to diet exercise and lose some weight. Continue same medications and return in 6 months       Referring Provider     Elby Showers, MD     Level of Service       Modifiers    PR PREVENTIVE  [phone] Significant, Separately Identifiable Evaluation And Management Service By The Same Physician On The Same Day Of The Procedure Or Other Service [25]    Additional E/M Codes Modifiers    PR OFFICE OUTPATIENT VISIT 15 MINUTES [99213] SIGNIFICANT, SEPARATELY IDENTIFIABLE EVALUATION AND MANAGEMENT SERVICE BY THE SAME PHYSICIAN ON THE SAME DAY OF THE PROCEDURE OR OTHER SERVICE [25]    LOS History         All Flowsheet Templates (all recorded)     Anthropometrics    Custom Formula Data    Encounter Vitals    Infectious Disease Screening      Referring Provider     Elby Showers, MD     All Charges for This Encounter     Code Description Service Date Service Provider Modifiers Qty    81003 CHG URINALYSIS, AUTO, W/O SCOPE  02/23/2013 Elby Showers, MD QW 1    [phone] PR PREVENTIVE VISIT,EST,40-64 02/23/2013 Elby Showers, MD  1    [phone] PR OFFICE OUTPATIENT VISIT 15 MINUTES 02/23/2013 Elby Showers, MD 25 1      Routing History     There are no sent or routed communications associated with this encounter.     AVS Reports     Date/Time Report Action User    07/05/2013 5:27 PM After Visit Summary Printed Elby Showers, MD      Smoking Cessation Audit Trail       Diabetic Foot Exam    No data filed     Diabetic Foot Form - Detailed    No data filed     Diabetic Foot Exam - Simple    No data filed     Guarantor Account: Tomio, Kirk ([id number])     Relation to Patient: Account Type Service Area    Self Personal/Family [place] for This Account     Coverage ID Payor Plan Insurance ID    [id number] [place] [place] [id number]    [id number] [place] [id number]        Guarantor Account: Garland, [place] ([id number])     Relation to Patient: Account Type Service Area    Self Personal/Family [place] for This Account     Coverage ID Payor Plan Insurance ID    [id number] [place] [id number]        Guarantor Account: Aarib, [place] ([id number])     Relation to Patient: Account Type Service Area    Self Personal/Family [facility]            Review of Systems     Objective:   Physical Exam        Assessment & Plan:

## 2014-05-14 NOTE — Progress Notes (Deleted)
   Subjective:    Patient ID: Kyle Dawson, male    DOB: 02-15-1966, 49 y.o.   MRN: [id number]  HPI     Review of Systems     Objective:   Physical Exam        Assessment & Plan:

## 2014-05-14 NOTE — Progress Notes (Signed)
   Subjective:    Patient ID: Kyle Dawson, male    DOB: 1965/12/30, 49 y.o.   MRN: [id number]  HPI 49 year old White male in today for health maintenance exam and evaluation of medical issues. History of hypertension and obesity. Doesn't diet and exercise as much as he should. He has a history of basal cell carcinoma of the skin, history of squamous cell carcinoma of the skin, history of dysplastic nevus. Hypertension was diagnosed around 1995. He's been on Lotrel since 2004.  History of squamous cell carcinoma involving right ear, nose and one on each side of his face.  Gets annual influenza immunization through employment at [employer]. He has had the hepatitis B series.  Intolerant of oxycodone as it causes adverse reactions.  Cardiolite study 2007 was negative. He presented with chest pain in July 2012. Cardiac evaluation was negative. 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  Social history: His father was a Pharmacist, hospital. Patient lived in [place] growing up and also in [place]. He is fluid in [place]. Has never been married. No children. He formerly resided in [place] and told around 2007. 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 He was in the [employer] for 8 years. While in the [employer] he was in [place], [place] and [place] . 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 He is recovering alcoholic and drug addict. Says he abused alcohol and marijuana.  Family history: Mother side of the family has heart disease and hypertension. Mother with history of hypertension and has had carotid endarterectomy. Patient says family members on his father's side of lived to be fairly old.    Review of Systems  All other systems reviewed and are negative.      Objective:   Physical Exam  Constitutional: He is oriented to person, place, and time. He  appears well-developed and well-nourished. No distress.  HENT:  Head: Normocephalic and atraumatic.  Right Ear: External ear normal.  Mouth/Throat: Oropharynx is clear and moist.  Eyes: Conjunctivae and EOM are normal. Pupils are equal, round, and reactive to light. Right eye exhibits no discharge. Left eye exhibits no discharge. No scleral icterus.  Neck: Neck supple. No JVD present. No thyromegaly present.  Cardiovascular: Normal rate, regular rhythm and normal heart sounds.   No murmur heard. Pulmonary/Chest: Effort normal and breath sounds normal. He has no wheezes. He has no rales.  Abdominal: Soft. Bowel sounds are normal. He exhibits no distension and no mass. There is no tenderness. There is no rebound and no guarding.  Genitourinary: Prostate normal.  Musculoskeletal: Normal range of motion. He exhibits no edema.  Lymphadenopathy:    He has no cervical adenopathy.  Neurological: He is alert and oriented to person, place, and time. He has normal reflexes. No cranial nerve deficit. Coordination normal.  Skin: Skin is warm and dry. No rash noted. He is not diaphoretic.  Psychiatric: He has a normal mood and affect. His behavior is normal. Judgment and thought content normal.  Vitals reviewed.         Assessment & Plan:  Hypertension-stable. Continue same medications  Obesity-needs to diet and exercise.  History of multiple skin cancers  History of dependent edema-currently not an issue  Plan: Return in 6 months. Continue same medications. Due for screening colonoscopy.

## 2014-07-18 ENCOUNTER — Ambulatory Visit (INDEPENDENT_AMBULATORY_CARE_PROVIDER_SITE_OTHER): Payer: 59 | Admitting: Internal Medicine

## 2014-07-18 ENCOUNTER — Encounter: Payer: Self-pay | Admitting: Internal Medicine

## 2014-07-18 ENCOUNTER — Telehealth: Payer: Self-pay | Admitting: *Deleted

## 2014-07-18 VITALS — BP 126/74 | HR 84 | Temp 98.3°F | Ht 69.25 in | Wt 251.0 lb

## 2014-07-18 DIAGNOSIS — I10 Essential (primary) hypertension: Secondary | ICD-10-CM | POA: Diagnosis not present

## 2014-07-18 DIAGNOSIS — M79671 Pain in right foot: Secondary | ICD-10-CM | POA: Diagnosis not present

## 2014-07-18 DIAGNOSIS — E119 Type 2 diabetes mellitus without complications: Secondary | ICD-10-CM | POA: Diagnosis not present

## 2014-07-18 DIAGNOSIS — E669 Obesity, unspecified: Secondary | ICD-10-CM | POA: Diagnosis not present

## 2014-07-18 DIAGNOSIS — G4762 Sleep related leg cramps: Secondary | ICD-10-CM

## 2014-07-18 LAB — RENAL FUNCTION PANEL
Albumin: 4.4 g/dL (ref 3.5–5.2)
BUN: 17 mg/dL (ref 6–23)
CHLORIDE: 96 meq/L (ref 96–112)
CO2: 27 mEq/L (ref 19–32)
Calcium: 9.8 mg/dL (ref 8.4–10.5)
Creat: 0.95 mg/dL (ref 0.50–1.35)
GLUCOSE: 183 mg/dL — AB (ref 70–99)
PHOSPHORUS: 3.1 mg/dL (ref 2.3–4.6)
Potassium: 4 mEq/L (ref 3.5–5.3)
Sodium: 136 mEq/L (ref 135–145)

## 2014-07-18 LAB — MAGNESIUM: MAGNESIUM: 1.9 mg/dL (ref 1.5–2.5)

## 2014-07-18 NOTE — Telephone Encounter (Signed)
Patient scheduled for ABI Monday March 14th at 4:00 [address] The patient left the office before the visit was finished. Message for patient to return call for appt time.

## 2014-07-19 ENCOUNTER — Other Ambulatory Visit (HOSPITAL_COMMUNITY): Payer: Self-pay | Admitting: Cardiology

## 2014-07-19 DIAGNOSIS — G4762 Sleep related leg cramps: Secondary | ICD-10-CM

## 2014-07-19 DIAGNOSIS — M25579 Pain in unspecified ankle and joints of unspecified foot: Secondary | ICD-10-CM

## 2014-07-19 DIAGNOSIS — I739 Peripheral vascular disease, unspecified: Secondary | ICD-10-CM

## 2014-07-19 NOTE — Telephone Encounter (Signed)
Patient given information about appt time

## 2014-07-22 ENCOUNTER — Ambulatory Visit (HOSPITAL_COMMUNITY): Payer: 59

## 2014-07-22 ENCOUNTER — Telehealth: Payer: Self-pay | Admitting: *Deleted

## 2014-07-22 ENCOUNTER — Ambulatory Visit (HOSPITAL_COMMUNITY): Payer: 59 | Attending: Cardiology | Admitting: Cardiology

## 2014-07-22 ENCOUNTER — Ambulatory Visit
Admission: RE | Admit: 2014-07-22 | Discharge: 2014-07-22 | Disposition: A | Discharge status: Home / Self Care | Payer: 59 | Source: Ambulatory Visit | Attending: Internal Medicine | Admitting: Internal Medicine

## 2014-07-22 DIAGNOSIS — M25579 Pain in unspecified ankle and joints of unspecified foot: Secondary | ICD-10-CM | POA: Diagnosis not present

## 2014-07-22 DIAGNOSIS — G4762 Sleep related leg cramps: Secondary | ICD-10-CM | POA: Diagnosis not present

## 2014-07-22 DIAGNOSIS — I739 Peripheral vascular disease, unspecified: Secondary | ICD-10-CM | POA: Insufficient documentation

## 2014-07-22 DIAGNOSIS — M79671 Pain in right foot: Secondary | ICD-10-CM

## 2014-07-22 NOTE — Telephone Encounter (Signed)
Reviewed foot xray results with patient

## 2014-07-22 NOTE — Progress Notes (Signed)
Lower arterial Doppler performed

## 2014-07-24 ENCOUNTER — Telehealth: Payer: Self-pay | Admitting: *Deleted

## 2014-07-25 NOTE — Telephone Encounter (Signed)
Left message regarding results of ABI study

## 2014-08-08 NOTE — Progress Notes (Signed)
   Subjective:    Patient ID: Kyle Dawson, male    DOB: 19-Jun-1965, 49 y.o.   MRN: [id number]  HPI  Patient is concerned having the diagnosis of type 2 diabetes that he may have some peripheral vascular disease. He's been experiencing some nocturnal leg cramps. Concerned about the circulation in his legs. In October hemoglobin A1c was 6.7%. 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 Seems to have noticed a little more edema recently.  Also has noticed some right foot pain    Review of Systems     Objective:   Physical Exam  Pulses are normal in the feet. Chest clear. Cardiac exam regular rate and rhythm. No significant edema of the lower extremities.      Assessment & Plan:  Nocturnal leg cramps-try magnesium supplement over-the-counter  Controlled type 2 diabetes mellitus-watch diet exercise and lose weight  Hypertension-stable on medication  Right foot pain-rule out stress fracture because with him on his feet a great deal, it is a  plausible situation  Plan: Peripheral artery disease evaluation with ABIs. X-ray of right foot. Magnesium supplement for nocturnal leg cramps.

## 2014-08-08 NOTE — Patient Instructions (Signed)
Patient will have evaluation for peripheral vascular disease. Also have x-ray of right foot. Check basic metabolic panel. Take magnesium for leg cramps. Magnesium level checked. Watch diet and exercise. Try to lose weight.

## 2014-08-12 ENCOUNTER — Encounter: Payer: Self-pay | Admitting: Internal Medicine

## 2014-08-12 ENCOUNTER — Ambulatory Visit (INDEPENDENT_AMBULATORY_CARE_PROVIDER_SITE_OTHER): Payer: 59 | Admitting: Internal Medicine

## 2014-08-12 VITALS — BP 126/78 | HR 102 | Temp 98.3°F | Wt 252.0 lb

## 2014-08-12 DIAGNOSIS — G4762 Sleep related leg cramps: Secondary | ICD-10-CM

## 2014-08-12 DIAGNOSIS — M79676 Pain in unspecified toe(s): Secondary | ICD-10-CM

## 2014-08-12 DIAGNOSIS — I10 Essential (primary) hypertension: Secondary | ICD-10-CM | POA: Diagnosis not present

## 2014-08-12 DIAGNOSIS — E669 Obesity, unspecified: Secondary | ICD-10-CM | POA: Diagnosis not present

## 2014-08-12 DIAGNOSIS — E119 Type 2 diabetes mellitus without complications: Secondary | ICD-10-CM

## 2014-08-12 DIAGNOSIS — R609 Edema, unspecified: Secondary | ICD-10-CM | POA: Diagnosis not present

## 2014-08-12 MED ORDER — AMLODIPINE BESYLATE 5 MG PO TABS: 5.0000 mg | ORAL_TABLET | Freq: Every day | ORAL | Status: DC

## 2014-08-12 MED ORDER — INDOMETHACIN 50 MG PO CAPS
ORAL_CAPSULE | ORAL | Status: DC
Start: 2014-08-12 — End: 2016-01-20

## 2014-08-12 NOTE — Patient Instructions (Signed)
Take Indocin 50 mg 3 times daily as needed for toe pain. Uric acid is pending. Decrease amlodipine to 5 mg daily instead of 10 mg daily and see if  edema improves

## 2014-08-13 ENCOUNTER — Ambulatory Visit: Payer: 59 | Admitting: Internal Medicine

## 2014-08-13 ENCOUNTER — Telehealth: Payer: Self-pay | Admitting: *Deleted

## 2014-08-13 LAB — BASIC METABOLIC PANEL
BUN: 14 mg/dL (ref 6–23)
CALCIUM: 9.3 mg/dL (ref 8.4–10.5)
CHLORIDE: 100 meq/L (ref 96–112)
CO2: 29 meq/L (ref 19–32)
CREATININE: 1.04 mg/dL (ref 0.50–1.35)
GLUCOSE: 315 mg/dL — AB (ref 70–99)
POTASSIUM: 4.4 meq/L (ref 3.5–5.3)
Sodium: 139 mEq/L (ref 135–145)

## 2014-08-13 NOTE — Telephone Encounter (Signed)
reviewed lab work with patient .Patient instructed to watch diet and Check FSBS to moniotor glucose levels more closely. Patient verbalized understanding

## 2014-08-14 ENCOUNTER — Telehealth: Payer: Self-pay | Admitting: *Deleted

## 2014-08-14 LAB — URIC ACID: Uric Acid, Serum: 6.2 mg/dL (ref 4.0–7.8)

## 2014-08-14 NOTE — Telephone Encounter (Signed)
Left message with uric acid results on patient voice mail

## 2014-10-06 ENCOUNTER — Encounter: Payer: Self-pay | Admitting: Internal Medicine

## 2014-10-06 DIAGNOSIS — E119 Type 2 diabetes mellitus without complications: Secondary | ICD-10-CM

## 2014-10-06 DIAGNOSIS — E669 Obesity, unspecified: Secondary | ICD-10-CM | POA: Insufficient documentation

## 2014-10-06 DIAGNOSIS — G4762 Sleep related leg cramps: Secondary | ICD-10-CM | POA: Insufficient documentation

## 2014-10-06 NOTE — Progress Notes (Signed)
   Subjective:    Patient ID: Kyle Dawson, male    DOB: 01-30-1966, 49 y.o.   MRN: [id number]  HPI  Patient has history of hypertension, diabetes that is diet controlled, obesity, dependent edema. About a month ago he was seen here concerned with nocturnal leg cramps. He thought he might have some peripheral artery disease. ABIs were normal. It was recommended the patient watch his diet and lose weight. At that time he weighed 251 pounds. In November he weighed 251 pounds. His blood pressure is stable and under good control. He's been on Lotrel since 2004. He had a Cardiolite study in 2007 that was negative. He presented with chest pain in 2012 and cardiac evaluation was negative. 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 Leg cramps have improved. Edema has improved. He wants to be checked for gout. Has some pain in his toe. At last visit had some right foot pain but x-ray was negative. Uric acid will be drawn. Basic metabolic panel drawn today. Unfortunately his nonfasting glucose is 315.    Review of Systems     Objective:   Physical Exam  Chest clear. Cardiac exam regular rate and rhythm. Extremities trace edema      Assessment & Plan:  Obesity-needs diet exercise and weight loss  Type 2 diabetes mellitus-concerned it may not be as well controlled as we once thought. He needs a hemoglobin A1c drawn and enroll in med Link program.  Nocturnal leg cramps-improved  Hypertension-stable on current medication  Toe pain-take Indocin 50 mg 3 times a day for 5-7 days.  Dependent edema-decrease amlodipine from 10 mg to 5 mg daily. Continue to monitor blood pressure and see if edema improves.  Plan: Enroll in Med Link program and return in 3 months.  Plan: Recommend Med Link program for him. Needs to get home glucose monitor and may need  medication at this point.

## 2015-01-17 ENCOUNTER — Encounter: Payer: Self-pay | Admitting: Internal Medicine

## 2015-11-13 ENCOUNTER — Ambulatory Visit: Payer: Self-pay | Admitting: Internal Medicine

## 2016-01-19 ENCOUNTER — Other Ambulatory Visit: Payer: Self-pay | Admitting: Internal Medicine

## 2016-01-19 ENCOUNTER — Other Ambulatory Visit: Payer: BC Managed Care – PPO | Admitting: Internal Medicine

## 2016-01-19 DIAGNOSIS — E663 Overweight: Secondary | ICD-10-CM

## 2016-01-19 DIAGNOSIS — Z125 Encounter for screening for malignant neoplasm of prostate: Secondary | ICD-10-CM

## 2016-01-19 DIAGNOSIS — Z85828 Personal history of other malignant neoplasm of skin: Secondary | ICD-10-CM

## 2016-01-19 DIAGNOSIS — E669 Obesity, unspecified: Secondary | ICD-10-CM

## 2016-01-19 DIAGNOSIS — I10 Essential (primary) hypertension: Secondary | ICD-10-CM

## 2016-01-19 DIAGNOSIS — E119 Type 2 diabetes mellitus without complications: Secondary | ICD-10-CM

## 2016-01-19 LAB — LIPID PANEL
CHOLESTEROL: 196 mg/dL (ref 125–200)
HDL: 45 mg/dL (ref >=40)
LDL Cholesterol: 114 mg/dL (ref <130)
Total CHOL/HDL Ratio: 4.4 Ratio (ref <=5.0)
Triglycerides: 186 mg/dL — ABNORMAL HIGH (ref <150)
VLDL: 37 mg/dL — ABNORMAL HIGH (ref <30)

## 2016-01-19 LAB — COMPLETE METABOLIC PANEL WITH GFR
ALBUMIN: 4.2 g/dL (ref 3.6–5.1)
ALK PHOS: 53 U/L (ref 40–115)
ALT: 40 U/L (ref 9–46)
AST: 24 U/L (ref 10–35)
BUN: 17 mg/dL (ref 7–25)
CALCIUM: 9.4 mg/dL (ref 8.6–10.3)
CO2: 25 mmol/L (ref 20–31)
CREATININE: 0.87 mg/dL (ref 0.70–1.33)
Chloride: 97 mmol/L — ABNORMAL LOW (ref 98–110)
GFR, Est African American: >89 mL/min (ref >=60)
GFR, Est Non African American: >89 mL/min (ref >=60)
GLUCOSE: 259 mg/dL — AB (ref 65–99)
Potassium: 4 mmol/L (ref 3.5–5.3)
Sodium: 135 mmol/L (ref 135–146)
Total Bilirubin: 1.1 mg/dL (ref 0.2–1.2)
Total Protein: 6.4 g/dL (ref 6.1–8.1)

## 2016-01-19 LAB — CBC WITH DIFFERENTIAL/PLATELET
BASOS PCT: 0 %
Basophils Absolute: 0 cells/uL (ref 0–200)
Eosinophils Absolute: 210 cells/uL (ref 15–500)
Eosinophils Relative: 3 %
HCT: 47.2 % (ref 38.5–50.0)
HEMOGLOBIN: 16.2 g/dL (ref 13.2–17.1)
LYMPHS ABS: 1750 {cells}/uL (ref 850–3900)
Lymphocytes Relative: 25 %
MCH: 31.1 pg (ref 27.0–33.0)
MCHC: 34.3 g/dL (ref 32.0–36.0)
MCV: 90.6 fL (ref 80.0–100.0)
MONO ABS: 630 {cells}/uL (ref 200–950)
MPV: 9.6 fL (ref 7.5–12.5)
Monocytes Relative: 9 %
NEUTROS ABS: 4410 {cells}/uL (ref 1500–7800)
NEUTROS PCT: 63 %
Platelets: 218 10*3/uL (ref 140–400)
RBC: 5.21 MIL/uL (ref 4.20–5.80)
RDW: 12.9 % (ref 11.0–15.0)
WBC: 7 10*3/uL (ref 3.8–10.8)

## 2016-01-19 LAB — PSA: PSA: 0.9 ng/mL (ref <=4.0)

## 2016-01-20 ENCOUNTER — Encounter: Payer: Self-pay | Admitting: Internal Medicine

## 2016-01-20 ENCOUNTER — Ambulatory Visit (INDEPENDENT_AMBULATORY_CARE_PROVIDER_SITE_OTHER): Payer: BC Managed Care – PPO | Admitting: Internal Medicine

## 2016-01-20 VITALS — BP 138/94 | HR 95 | Temp 97.9°F | Ht 69.25 in | Wt 235.0 lb

## 2016-01-20 DIAGNOSIS — Z0000 Encounter for general adult medical examination without abnormal findings: Secondary | ICD-10-CM

## 2016-01-20 DIAGNOSIS — E119 Type 2 diabetes mellitus without complications: Secondary | ICD-10-CM

## 2016-01-20 DIAGNOSIS — I10 Essential (primary) hypertension: Secondary | ICD-10-CM

## 2016-01-20 DIAGNOSIS — R609 Edema, unspecified: Secondary | ICD-10-CM | POA: Diagnosis not present

## 2016-01-20 DIAGNOSIS — E669 Obesity, unspecified: Secondary | ICD-10-CM

## 2016-01-20 LAB — POCT URINALYSIS DIPSTICK
BILIRUBIN UA: NEGATIVE
Blood, UA: NEGATIVE
Glucose, UA: POSITIVE
LEUKOCYTES UA: NEGATIVE
NITRITE UA: NEGATIVE
PH UA: 5
PROTEIN UA: NEGATIVE
Spec Grav, UA: 1.015
Urobilinogen, UA: 0.2

## 2016-01-20 LAB — HEMOGLOBIN A1C
Hgb A1c MFr Bld: 12 % — ABNORMAL HIGH (ref <5.7)
Mean Plasma Glucose: 298 mg/dL

## 2016-01-20 MED ORDER — BENAZEPRIL HCL 40 MG PO TABS: 40.0000 mg | ORAL_TABLET | Freq: Every day | ORAL | 0 refills | Status: DC

## 2016-01-20 MED ORDER — AMLODIPINE BESYLATE 5 MG PO TABS: 5.0000 mg | ORAL_TABLET | Freq: Every day | ORAL | 0 refills | Status: DC

## 2016-01-20 MED FILL — BENAZEPRIL HCL 40 MG TABLET: 40 | 90 days supply | Qty: 90 | Fill #0

## 2016-01-20 MED FILL — AMLODIPINE BESYLATE 5 MG TA: 5 | 90 days supply | Qty: 90 | Fill #0

## 2016-01-20 NOTE — Progress Notes (Signed)
   Subjective:    Patient ID: Kyle Dawson, male    DOB: March 17, 1966, 50 y.o.   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 He has run out of some of his antihypertensive medication. Unfortunately his blood pressure is elevated today.  He will need to return when he is compliant with his medication for office visit and blood pressure check.  He has glucose in his urine and fasting glucose is 259. Hemoglobin A1c was not added today by mistake and will be added today. He is to have endocrinology consultation. He is skeptical about starting metformin. He probably would be a candidate for Victoza. Says he's lost weight since last visit.    Review of Systems no chest pain. No shortness of breath.     Objective:   Physical Exam  Constitutional: He is oriented to person, place, and time. He appears well-developed and well-nourished. No distress.  HENT:  Head: Normocephalic and atraumatic.  Right Ear: External ear normal.  Mouth/Throat: Oropharynx is clear and moist. No oropharyngeal exudate.  Eyes: Right eye exhibits no discharge. Left eye exhibits no discharge.  Neck: Neck supple. No JVD present. No thyromegaly present.  Cardiovascular: Normal rate, regular rhythm, normal heart sounds and intact distal pulses.   No murmur heard. Pulmonary/Chest: Effort normal. He has no wheezes. He has no rales.  Abdominal: Soft. Bowel sounds are normal. He exhibits no distension and no mass. There is no tenderness. There is no rebound and no guarding.  Genitourinary: Prostate normal.  Musculoskeletal: He exhibits no edema.  Neurological: He is alert and oriented to person, place, and time. He has normal reflexes. No cranial nerve deficit.  Skin: Skin is warm and dry. No rash noted. He is not diaphoretic.    Psychiatric: He has a normal mood and affect. His behavior is normal. Thought content normal.  Vitals reviewed.         Assessment & Plan:  Routine health maintenance exam-schedule for colonoscopy in the near future  Type 2 diabetes mellitus-hemoglobin A1c pending. Urine sent for microalbumin. He is to return in 3 months for follow-up as well as blood pressure check. Refer to Dr. Dwyane Dee to discuss medication options with him is he has many questions.  Essential hypertension-refill benazepril 40 mg daily and amlodipine 5 mg daily. Return in early December for blood pressure check and office visit  Obesity-continue diet and exercise efforts  Dependent edema-wears compression hose at work. Currently not on diuretic.  Lipid management-does not want to be on statin medication. Lipid panel is normal.

## 2016-01-20 NOTE — Patient Instructions (Signed)
Referral for colonoscopy. Referral for diabetes management. Return in 3 months for office visit, blood pressure check and hemoglobin A1c. Please refill your medications for hypertension.

## 2016-01-21 LAB — MICROALBUMIN / CREATININE URINE RATIO
Creatinine, Urine: 131 mg/dL (ref 20–370)
MICROALB UR: 5.6 mg/dL
MICROALB/CREAT RATIO: 43 ug/mg{creat} — AB (ref <30)

## 2016-01-22 ENCOUNTER — Telehealth: Payer: Self-pay

## 2016-01-22 NOTE — Telephone Encounter (Signed)
Called patient. No answer. Will try later.  

## 2016-01-22 NOTE — Telephone Encounter (Signed)
-----   Message from Elby Showers, MD sent at 01/20/2016  3:25 PM EDT ----- Please call pt. Hgb AIC is 12%. We have made Endocrine referral. Have him call us back if he does not hear from them in 1 week.

## 2016-02-03 ENCOUNTER — Encounter: Payer: Self-pay | Admitting: Internal Medicine

## 2016-02-03 ENCOUNTER — Ambulatory Visit (INDEPENDENT_AMBULATORY_CARE_PROVIDER_SITE_OTHER): Payer: BC Managed Care – PPO | Admitting: Internal Medicine

## 2016-02-03 VITALS — BP 162/102 | HR 80 | Temp 97.8°F | Wt 228.0 lb

## 2016-02-03 DIAGNOSIS — L03115 Cellulitis of right lower limb: Secondary | ICD-10-CM | POA: Diagnosis not present

## 2016-02-03 DIAGNOSIS — T07XXXA Unspecified multiple injuries, initial encounter: Secondary | ICD-10-CM

## 2016-02-03 DIAGNOSIS — T148 Other injury of unspecified body region: Secondary | ICD-10-CM | POA: Diagnosis not present

## 2016-02-03 DIAGNOSIS — R609 Edema, unspecified: Secondary | ICD-10-CM

## 2016-02-03 DIAGNOSIS — I10 Essential (primary) hypertension: Secondary | ICD-10-CM | POA: Diagnosis not present

## 2016-02-03 MED ORDER — FUROSEMIDE 20 MG PO TABS: ORAL_TABLET | ORAL | 0 refills | Status: DC

## 2016-02-03 MED ORDER — DOXYCYCLINE HYCLATE 100 MG PO TABS: 100.0000 mg | ORAL_TABLET | Freq: Two times a day (BID) | ORAL | 0 refills | Status: DC

## 2016-02-03 MED FILL — FUROSEMIDE 20 MG TABLET: 20 | 30 days supply | Qty: 60 | Fill #0

## 2016-02-03 MED FILL — DOXYCYCLINE HYCLATE 100 MG: 100 | 10 days supply | Qty: 20 | Fill #0

## 2016-02-03 NOTE — Patient Instructions (Addendum)
Doxycycline 100 mg twice daily for 10 days. Apply Bactroban ointment to abraded areas twice daily, call if edema has not resolved in 2 weeks. Lasix 1 or 2 tabs daily temporarily until edema resolves. Keep feet elevated as much as possible.

## 2016-02-03 NOTE — Progress Notes (Signed)
   Subjective:    Patient ID: Kyle Dawson, male    DOB: [birth year], 50 y.o.   MRN: [id number]  HPI [place] his last visit here on September 12. He sustained abrasions to both legs [place]. Subsequently developed dependent edema and became alarmed. Also has been concerned about redness right anterior leg surrounding some of the abrasions    Review of Systems as above     Objective:   Physical Exam He has 3 abrasions right anterior mid leg that have some surrounding erythema. He has dependent edema of the right lower extremity and dependent edema of the left lower extremity with a couple of superficial abrasions of the left leg without erythema.  Blood pressure is elevated today. He has a history of essential hypertension. 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 This resolved when he he had more time at home and was able to keep his feet elevated. He was able to discontinue Lasix. He currently is on Lotensin 40 mg daily and amlodipine 5 mg daily.       Assessment & Plan:  Cellulitis right lower extremity  Bilateral dependent edema  Essential hypertension  Plan: Lasix 20 mg one or 2 tablets by mouth every morning. Try to keep feet elevated as much as possible. Doxycycline 100 mg twice daily for 10 days. Tetanus immunization is up-to-date. Call if dependent edema does not resolve in 2-3 weeks. Feel that dependent edema is related to leg injury and hopefully he will just need Lasix temporarily. He has had a prior history of dependent edema that resolved with staying off of his feet. Apply

## 2016-02-12 ENCOUNTER — Telehealth: Payer: Self-pay | Admitting: Internal Medicine

## 2016-02-12 ENCOUNTER — Encounter: Payer: Self-pay | Admitting: Internal Medicine

## 2016-02-12 MED ORDER — DOXYCYCLINE HYCLATE 100 MG PO TABS: 100.0000 mg | ORAL_TABLET | Freq: Two times a day (BID) | ORAL | 0 refills | Status: DC

## 2016-02-12 NOTE — Telephone Encounter (Signed)
Sent picture of leg wound whoich is improved but not completely well. Still has some surrounding erythema. Refill Doxycycline for another 10 days

## 2016-02-13 MED FILL — DOXYCYCLINE HYCLATE 100 MG: 100 | 10 days supply | Qty: 20 | Fill #0

## 2016-02-16 ENCOUNTER — Other Ambulatory Visit: Payer: Self-pay | Admitting: *Deleted

## 2016-02-16 ENCOUNTER — Encounter: Payer: Self-pay | Admitting: Endocrinology

## 2016-02-16 ENCOUNTER — Ambulatory Visit (INDEPENDENT_AMBULATORY_CARE_PROVIDER_SITE_OTHER): Payer: BC Managed Care – PPO | Admitting: Endocrinology

## 2016-02-16 VITALS — BP 130/84 | HR 82 | Temp 98.1°F | Resp 16 | Ht 69.0 in | Wt 238.4 lb

## 2016-02-16 DIAGNOSIS — I10 Essential (primary) hypertension: Secondary | ICD-10-CM

## 2016-02-16 DIAGNOSIS — N521 Erectile dysfunction due to diseases classified elsewhere: Secondary | ICD-10-CM

## 2016-02-16 DIAGNOSIS — E1165 Type 2 diabetes mellitus with hyperglycemia: Secondary | ICD-10-CM | POA: Diagnosis not present

## 2016-02-16 MED ORDER — METFORMIN HCL ER 500 MG PO TB24: 2000.0000 mg | ORAL_TABLET | Freq: Every day | ORAL | 3 refills | Status: DC

## 2016-02-16 MED ORDER — GLUCOSE BLOOD VI STRP: ORAL_STRIP | 1 refills | Status: DC

## 2016-02-16 MED ORDER — ONETOUCH DELICA LANCETS 33G MISC
1 refills | Status: DC
Start: 2016-02-16 — End: 2018-11-01

## 2016-02-16 MED ORDER — EMPAGLIFLOZIN 10 MG PO TABS
10.0000 mg | ORAL_TABLET | Freq: Every day | ORAL | 3 refills | Status: DC
Start: 2016-02-16 — End: 2016-08-25

## 2016-02-16 MED FILL — METFORMIN HCL ER 500 MG TAB: 500 | 30 days supply | Qty: 120 | Fill #0

## 2016-02-16 MED FILL — ONE TOUCH DELICA 33G LANCET: 90 days supply | Qty: 100 | Fill #0

## 2016-02-16 MED FILL — ONETOUCH VERIO TEST STRIP: 90 days supply | Qty: 100 | Fill #0

## 2016-02-16 MED FILL — JARDIANCE 10 MG TABLET: 10 | 30 days supply | Qty: 30 | Fill #0

## 2016-02-16 NOTE — Patient Instructions (Addendum)
Check blood sugars on waking up 2-3x per week  Also check blood sugars about 2 hours after a meal and do this after different meals by rotation  Recommended blood sugar levels on waking up is 90-130 and about 2 hours after meal is 130-160  Please bring your blood sugar monitor to each visit, thank you  Start taking Metformin 500 mg, 1 tablet with your main meal for 5 days. Occasionally this may initially cause loose stools or nausea. If  tolerating well after 5 days add a second Metformin tablet (500 mg) at the same time.   Continue adding another tablet after 5 days days if no persistent nausea or diarrhea until reaching the maximum tolerated dose or the full dose of 4 tablets once daily.  With Jardiance may cut Amlodipine in am

## 2016-02-16 NOTE — Progress Notes (Signed)
Patient ID: Kyle Dawson, male   DOB: [birth year], 50 y.o.   MRN: [id number]            Reason for Appointment: Consultation for Type 2 Diabetes  Referring physician: [place]   History of Present Illness:          Date of diagnosis of type 2 diabetes mellitus: ?  2015        Background history:   Although he thinks his blood sugars have been borderline in the past his labs include glucose readings of 183 and 315, 2 years ago He thinks he had a normal A1c in 2016 but no records are available this A1c was 6.7 in 2015.  Has not been on any medications  Recent history:         Non-insulin hypoglycemic drugs the patient is taking are: None  Current management, blood sugar patterns and problems identified:    about 4 months ago the patient started having significantly increased thirst and urination including at night  He also has had some weight loss this year.  He had an A1c checked when he was seen for infection of his right leg and this was 12%, he is now here for further management  He has checked his blood sugar on his own with a Generic meter in the last 2-3 weeks and blood sugars are mostly in the 180-250 range, little higher when he was having his leg infection.  He checks his blood sugar mostly about 3 hours after breakfast  With his sugars being higher he has been trying to cut back on higher fat and higher carbohydrate meals and overall healthier diet with which he thinks his increased thirst and urination are improving        Compliance with the medical regimen: Improving  Glucose monitoring:  done about 1 times a day         Glucometer: generic  Recent range: 179-250  Self-care: The diet that the patient has been following is: tries to limit high-fat foods and fast food .     Meal times are:  Breakfast is at 11 AM Typical meal intake: Breakfast is oatmeal               Dietician visit, most recent: None               Exercise:  walking 30 minutes, 3-4 times a  week  Weight history: max 265  Wt Readings from Last 3 Encounters:  02/16/16 238 lb 6.4 oz (108.1 kg)  02/03/16 228 lb (103.4 kg)  01/20/16 235 lb (106.6 kg)    Glycemic control:   Lab Results  Component Value Date   HGBA1C 12.0 (H) 01/19/2016   HGBA1C 6.7 (H) 03/07/2014   HGBA1C 6.2 (H) 11/12/2010   Lab Results  Component Value Date   MICROALBUR 5.6 01/20/2016   LDLCALC 114 01/19/2016   CREATININE 0.87 01/19/2016   Lab Results  Component Value Date   MICRALBCREAT 43 (H) 01/20/2016         Medication List       Accurate as of 02/16/16  8:52 PM. Always use your most recent med list.          amLODipine 5 MG tablet Commonly known as:  NORVASC Take 1 tablet (5 mg total) by mouth daily.   aspirin 81 MG tablet Take 81 mg by mouth daily.   B-50 Complex/Vitamin C Tabs Take 2 tablets by mouth 2 (two) times daily.  benazepril 40 MG tablet Commonly known as:  LOTENSIN Take 1 tablet (40 mg total) by mouth daily.   CALCIUM 1200 PO Take by mouth daily.   CARNITINE PO Take by mouth 2 (two) times daily.   cholecalciferol 1000 units tablet Commonly known as:  VITAMIN D Take 1,000 Units by mouth daily.   doxycycline 100 MG tablet Commonly known as:  VIBRA-TABS Take 1 tablet (100 mg total) by mouth 2 (two) times daily.   empagliflozin 10 MG Tabs tablet Commonly known as:  JARDIANCE Take 10 mg by mouth daily with breakfast.   fish oil-omega-3 fatty acids 1000 MG capsule Take 2 g by mouth daily.   furosemide 20 MG tablet Commonly known as:  LASIX One or two daily for dependent edema   glucose blood test strip Commonly known as:  ONETOUCH VERIO Use as instructed to check blood sugar once a day dx code E11.9   ibuprofen 800 MG tablet Commonly known as:  ADVIL,MOTRIN Take 1 tablet (800 mg total) by mouth 3 (three) times daily.   L-LYSINE PO Take by mouth 3 times/day as needed-between meals & bedtime.   metFORMIN 500 MG 24 hr tablet Commonly known  as:  GLUCOPHAGE-XR Take 4 tablets (2,000 mg total) by mouth daily with supper.   ONETOUCH DELICA LANCETS 62M Misc Use to check blood sugar once a day dx code E11.9   Saw Palmetto (Serenoa repens) 450 MG Caps Take by mouth daily.   Selenium 100 MCG Caps Take by mouth daily.   VITAMIN A PO Take by mouth daily.   vitamin E 400 UNIT capsule Take 400 Units by mouth daily.   Zinc 50 MG Tabs Take 2 tablets by mouth 2 (two) times daily.       Allergies:  Allergies  Allergen Reactions  . Oxycodone     Past Medical History:  Diagnosis Date  . Basal cell carcinoma   . Chest pain   . Edema    lower extremity  . Hypertension   . Squamous cell carcinoma    R ear, nose, each side of face    Past Surgical History:  Procedure Laterality Date  . Removal basal cell carcinoma    . Removal squamous cell carcinoma      Family History  Problem Relation Age of Onset  . Peripheral vascular disease Mother 21  . Hypertension Mother   . Heart failure Mother   . CAD Maternal Grandfather   . Heart failure Paternal Grandfather     Social History:  reports that he has never smoked. He has never used smokeless tobacco. He reports that he does not drink alcohol. His drug history is not on file.   Review of Systems  Constitutional: Negative for weight loss and reduced appetite.  Eyes: Negative for blurred vision.  Respiratory: Negative for shortness of breath.   Cardiovascular: Negative for chest pain and leg swelling.       Previously had leg swelling, has not taken Lasix recently  Gastrointestinal: Negative for constipation and diarrhea.  Endocrine: Positive for fatigue, polydipsia and erectile dysfunction.       Has had erectile dysfunction for a while, has not desired treatment recently  Genitourinary: Positive for frequency.  Musculoskeletal: Negative for muscle aches and muscle cramps.  Skin: Negative for itching.  Neurological: Negative for numbness and tingling.      Lipid history: Not on Rx     Lab Results  Component Value Date   CHOL 196 01/19/2016  HDL 45 01/19/2016   LDLCALC 114 01/19/2016   TRIG 186 (H) 01/19/2016   CHOLHDL 4.4 01/19/2016           Hypertension:Since 1996, On amlodipine and Lotensin Blood pressure checked regularly  at home with recent reading 128/79  BP Readings from Last 3 Encounters:  02/16/16 130/84  02/03/16 (!) 162/102  01/20/16 (!) 138/94    Most recent eye exam was 4/17  Most recent foot exam: 10/17   LABS:  No visits with results within 1 Week(s) from this visit.  Latest known visit with results is:  Office Visit on 01/20/2016  Component Date Value Ref Range Status  . Color, UA 01/20/2016 dark yellow   Final  . Clarity, UA 01/20/2016 clear   Final  . Glucose, UA 01/20/2016 Positive   Final  . Bilirubin, UA 01/20/2016 negative   Final  . Ketones, UA 01/20/2016 large   Final  . Spec Grav, UA 01/20/2016 1.015   Final  . Blood, UA 01/20/2016 negative   Final  . pH, UA 01/20/2016 5.0   Final  . Protein, UA 01/20/2016 negative   Final  . Urobilinogen, UA 01/20/2016 0.2   Final  . Nitrite, UA 01/20/2016 negative   Final  . Leukocytes, UA 01/20/2016 Negative  Negative Final  . Creatinine, Urine 01/21/2016 131  20 - 370 mg/dL Final  . Microalb, Ur 01/21/2016 5.6  Not estab mg/dL Final  . Microalb Creat Ratio 01/21/2016 43* <30 mcg/mg creat Final   Comment: The ADA has defined abnormalities in albumin excretion as follows:           Category           Result                            (mcg/mg creatinine)                 Normal:    <30       Microalbuminuria:    30 - 299   Clinical albuminuria:    > or = 300   The ADA recommends that at least two of three specimens collected within a 3 - 6 month period be abnormal before considering a patient to be within a diagnostic category.       Physical Examination:  BP 130/84   Pulse 82   Temp 98.1 F (36.7 C)   Resp 16   Ht 5' 9"  (1.753 m)    Wt 238 lb 6.4 oz (108.1 kg)   SpO2 97%   BMI 35.21 kg/m   GENERAL:         Patient has generalized obesity.   HEENT:         Eye exam shows normal external appearance. Fundus exam shows no retinopathy. Oral exam shows normal mucosa .  NECK:   There is no lymphadenopathy Thyroid is not enlarged and no nodules felt.  Carotids are normal to palpation and no bruit heard LUNGS:         Chest is symmetrical. Lungs are clear to auscultation.Marland Kitchen   HEART:         Heart sounds:  S1 and S2 are normal. No murmur or click heard., no S3 or S4.   ABDOMEN:   There is no distention present. Liver and spleen are not palpable. No other mass or tenderness present.   NEUROLOGICAL:   Ankle jerks are absent bilaterally.    Diabetic Foot  Exam - Simple   Simple Foot Form Diabetic Foot exam was performed with the following findings:  Yes 02/16/2016  2:35 PM  Visual Inspection No deformities, no ulcerations, no other skin breakdown bilaterally:  Yes Sensation Testing Intact to touch and monofilament testing bilaterally:  Yes Pulse Check Posterior Tibialis and Dorsalis pulse intact bilaterally:  Yes Comments            MUSCULOSKELETAL:  There is no swelling or deformity of the peripheral joints. Spine is normal to inspection.   EXTREMITIES:     There is no edema. No skin lesions present.Marland Kitchen SKIN:       No rash or lesions of concern.        ASSESSMENT:  Diabetes type 2, uncontrolled   Although he has had variable degrees of hyperglycemia in the past his A1c has usually been near normal. He has been symptomatic with hyperglycemia and A1c of 12% recently. He is not excessively symptomatic currently but trying to improve his diet Also blood sugars are not reportedly over 250; does not need to be on insulin right away. He does need to lose weight. Also needs diabetes education Discussed that he will need a 2 drug regimen for management of his diabetes and prevention of progression    HYPERTENSION: Appears to  have high blood pressure readings recently but usually normal at home  Complications of diabetes: ?  Erectile dysfunction  PLAN:     He will need a 2 drug treatment for management of his significant hyperglycemia.  Discussed actions and dosage regimen for metformin as well as possible side effects.  He will start with 500 mg of metformin ER and progressively increase the dose until reaching 2000 mg or maximally tolerated dose. Discussed action of SGLT 2 drugs on lowering glucose by decreasing kidney absorption of glucose, benefits of weight loss and lower blood pressure, possible side effects including candidiasis and dosage regimen. He was given a variable monitor and showed him how to use this. He will check blood sugars at various times including 2 hours after meals and also on waking up. Consultation with dietitian and nurse educator.  He will try to do this with his health system locally. Needs follow-up within the next month to make sure his blood sugars are improving Continue regular exercise and increase duration of walking   Patient Instructions  Check blood sugars on waking up 2-3x per week  Also check blood sugars about 2 hours after a meal and do this after different meals by rotation  Recommended blood sugar levels on waking up is 90-130 and about 2 hours after meal is 130-160  Please bring your blood sugar monitor to each visit, thank you  Start taking Metformin 500 mg, 1 tablet with your main meal for 5 days. Occasionally this may initially cause loose stools or nausea. If  tolerating well after 5 days add a second Metformin tablet (500 mg) at the same time.   Continue adding another tablet after 5 days days if no persistent nausea or diarrhea until reaching the maximum tolerated dose or the full dose of 4 tablets once daily.  With Jardiance may cut Amlodipine in am                   Counseling time on subjects discussed above is over 50% of today's 60  minute visit   Consultation note has been sent to the referring physician  [facility] 02/16/2016, 8:52 PM   Note: This office note  was prepared with Estate agent. Any transcriptional errors that result from this process are unintentional.

## 2016-03-12 ENCOUNTER — Other Ambulatory Visit (INDEPENDENT_AMBULATORY_CARE_PROVIDER_SITE_OTHER): Payer: 59

## 2016-03-12 DIAGNOSIS — E1165 Type 2 diabetes mellitus with hyperglycemia: Secondary | ICD-10-CM | POA: Diagnosis not present

## 2016-03-12 LAB — BASIC METABOLIC PANEL
BUN: 22 mg/dL (ref 6–23)
CHLORIDE: 102 meq/L (ref 96–112)
CO2: 26 meq/L (ref 19–32)
Calcium: 9.4 mg/dL (ref 8.4–10.5)
Creatinine, Ser: 0.94 mg/dL (ref 0.40–1.50)
GFR: 89.35 mL/min (ref >60.00)
Glucose, Bld: 112 mg/dL — ABNORMAL HIGH (ref 70–99)
Potassium: 4.1 mEq/L (ref 3.5–5.1)
Sodium: 137 mEq/L (ref 135–145)

## 2016-03-12 MED FILL — METFORMIN HCL ER 500 MG TAB: 500 | 30 days supply | Qty: 120 | Fill #1

## 2016-03-12 MED FILL — JARDIANCE 10 MG TABLET: 10 | 30 days supply | Qty: 30 | Fill #1

## 2016-03-13 LAB — FRUCTOSAMINE: Fructosamine: 283 umol/L (ref 0–285)

## 2016-03-15 ENCOUNTER — Ambulatory Visit (INDEPENDENT_AMBULATORY_CARE_PROVIDER_SITE_OTHER): Payer: BC Managed Care – PPO | Admitting: Endocrinology

## 2016-03-15 ENCOUNTER — Encounter: Payer: Self-pay | Admitting: Endocrinology

## 2016-03-15 VITALS — BP 128/84 | HR 84 | Ht 69.0 in | Wt 233.0 lb

## 2016-03-15 DIAGNOSIS — N521 Erectile dysfunction due to diseases classified elsewhere: Secondary | ICD-10-CM

## 2016-03-15 DIAGNOSIS — I10 Essential (primary) hypertension: Secondary | ICD-10-CM

## 2016-03-15 DIAGNOSIS — E1165 Type 2 diabetes mellitus with hyperglycemia: Secondary | ICD-10-CM

## 2016-03-15 DIAGNOSIS — E782 Mixed hyperlipidemia: Secondary | ICD-10-CM | POA: Diagnosis not present

## 2016-03-15 MED ORDER — SILDENAFIL CITRATE 20 MG PO TABS: ORAL_TABLET | ORAL | 1 refills | Status: DC

## 2016-03-15 MED FILL — SILDENAFIL 20 MG TABLET: 20 | 30 days supply | Qty: 30 | Fill #0

## 2016-03-15 NOTE — Patient Instructions (Signed)
Check blood sugars on waking up  2x weekly  Also check blood sugars about 2 hours after a meal and do this after different meals by rotation  Recommended blood sugar levels on waking up is 90-130 and about 2 hours after meal is 130-160  Please bring your blood sugar monitor to each visit, thank you  Sildenafil 3-4 tabs at a time

## 2016-03-15 NOTE — Progress Notes (Signed)
Patient ID: Kyle Dawson, male   DOB: [birth year], 50 y.o.   MRN: [id number]            Reason for Appointment: Follow-up for Type 2 Diabetes  Referring physician: [place]   History of Present Illness:          Date of diagnosis of type 2 diabetes mellitus: ?  2015        Background history:   Although he thinks his blood sugars have been borderline in the past his labs include glucose readings of 183 and 315, 2 years ago He thinks he had a normal A1c in 2016 but no records are available this A1c was 6.7 in 2015.  Has not been on any medications  Recent history:         Non-insulin hypoglycemic drugs the patient is taking are: Jardiance 10 mg daily, metformin ER 1500 mg daily  Current management, blood sugar patterns and problems identified:  When first seen the patient was not on any pharmacological therapy for his poorly controlled diabetes and A1c of 12%  He has started taking metformin ER and has increased the dose up to 1500 mg daily.  However he has difficulty taking this consistently until recently because of feeling of dyspepsia and he thinks he can take it only after eating some protein  He has however been able to take Jardiance before breakfast daily and has no perceived side effects  He has blood sugar readings checked very sporadically especially in the last 2 weeks with some readings when he comes back from work late at night and occasionally when he wakes up  Lab glucose was 112, fructosamine is 283  He thinks he is doing better on his diet and cutting back on rice and potatoes and getting more whole-grain foods and lower fat meats per  Has lost a little weight  Continues to walk regularly        Compliance with the medical regimen: Improving  Glucose monitoring:  done about 1 times a day         Glucometer:  One Touch Verio  Recent range: Has only 2 blood sugars in about 2 weeks  Mean values apply above for all meters except median for One  Touch  PRE-MEAL Fasting Lunch Dinner Bedtime Overall  Glucose range: 101-283   1 92-236  153-260    Mean/median:     188    POST-MEAL PC Breakfast PC Lunch PC Dinner  Glucose range:     Mean/median:      -250  Self-care: The diet that the patient has been following is: tries to limit high-fat foods and fast food .     Meal times are:  Breakfast is at 11 AM Typical meal intake: Breakfast is oatmeal               Dietician visit, most recent: None               Exercise:  walking 30 minutes, 3-4 times a week  Weight history: max 265  Wt Readings from Last 3 Encounters:  03/15/16 233 lb (105.7 kg)  02/16/16 238 lb 6.4 oz (108.1 kg)  02/03/16 228 lb (103.4 kg)    Glycemic control:   Lab Results  Component Value Date   HGBA1C 12.0 (H) 01/19/2016   HGBA1C 6.7 (H) 03/07/2014   HGBA1C 6.2 (H) 11/12/2010   Lab Results  Component Value Date   MICROALBUR 5.6 01/20/2016   LDLCALC 114 01/19/2016  CREATININE 0.94 03/12/2016   Lab Results  Component Value Date   MICRALBCREAT 43 (H) 01/20/2016    Lab on 03/12/2016  Component Date Value Ref Range Status  . Sodium 03/12/2016 137  135 - 145 mEq/L Final  . Potassium 03/12/2016 4.1  3.5 - 5.1 mEq/L Final  . Chloride 03/12/2016 102  96 - 112 mEq/L Final  . CO2 03/12/2016 26  19 - 32 mEq/L Final  . Glucose, Bld 03/12/2016 112* 70 - 99 mg/dL Final  . BUN 03/12/2016 22  6 - 23 mg/dL Final  . Creatinine, Ser 03/12/2016 0.94  0.40 - 1.50 mg/dL Final  . Calcium 03/12/2016 9.4  8.4 - 10.5 mg/dL Final  . GFR 03/12/2016 89.35  >60.00 mL/min Final  . Fructosamine 03/13/2016 283  0 - 285 umol/L Final   Comment: Published reference interval for apparently healthy subjects between age 98 and 91 is 79 - 285 umol/L and in a poorly controlled diabetic population is 228 - 563 umol/L with a mean of 396 umol/L.     OTHER active problems: See review of systems     Medication List       Accurate as of 03/15/16  3:06 PM. Always use your  most recent med list.          amLODipine 5 MG tablet Commonly known as:  NORVASC Take 1 tablet (5 mg total) by mouth daily.   aspirin 81 MG tablet Take 81 mg by mouth daily.   B-50 Complex/Vitamin C Tabs Take 2 tablets by mouth 2 (two) times daily.   benazepril 40 MG tablet Commonly known as:  LOTENSIN Take 1 tablet (40 mg total) by mouth daily.   CALCIUM 1200 PO Take by mouth daily.   CARNITINE PO Take by mouth 2 (two) times daily.   cholecalciferol 1000 units tablet Commonly known as:  VITAMIN D Take 1,000 Units by mouth daily.   empagliflozin 10 MG Tabs tablet Commonly known as:  JARDIANCE Take 10 mg by mouth daily with breakfast.   fish oil-omega-3 fatty acids 1000 MG capsule Take 2 g by mouth daily.   furosemide 20 MG tablet Commonly known as:  LASIX One or two daily for dependent edema   glucose blood test strip Commonly known as:  ONETOUCH VERIO Use as instructed to check blood sugar once a day dx code E11.9   ibuprofen 800 MG tablet Commonly known as:  ADVIL,MOTRIN Take 1 tablet (800 mg total) by mouth 3 (three) times daily.   L-LYSINE PO Take by mouth 3 times/day as needed-between meals & bedtime.   metFORMIN 500 MG 24 hr tablet Commonly known as:  GLUCOPHAGE-XR Take 4 tablets (2,000 mg total) by mouth daily with supper.   ONETOUCH DELICA LANCETS 00L Misc Use to check blood sugar once a day dx code E11.9   Saw Palmetto (Serenoa repens) 450 MG Caps Take by mouth daily.   Selenium 100 MCG Caps Take by mouth daily.   sildenafil 20 MG tablet Commonly known as:  REVATIO As directed   VITAMIN A PO Take by mouth daily.   vitamin E 400 UNIT capsule Take 400 Units by mouth daily.   Zinc 50 MG Tabs Take 2 tablets by mouth 2 (two) times daily.       Allergies:  Allergies  Allergen Reactions  . Oxycodone     Past Medical History:  Diagnosis Date  . Basal cell carcinoma   . Chest pain   . Edema    lower  extremity  . Hypertension    . Squamous cell carcinoma    R ear, nose, each side of face    Past Surgical History:  Procedure Laterality Date  . Removal basal cell carcinoma    . Removal squamous cell carcinoma      Family History  Problem Relation Age of Onset  . Peripheral vascular disease Mother 67  . Hypertension Mother   . Heart failure Mother   . CAD Maternal Grandfather   . Heart failure Paternal Grandfather     Social History:  reports that he has never smoked. He has never used smokeless tobacco. He reports that he does not drink alcohol. His drug history is not on file.   Review of Systems   Lipid history: Not on Rx     Lab Results  Component Value Date   CHOL 196 01/19/2016   HDL 45 01/19/2016   LDLCALC 114 01/19/2016   TRIG 186 (H) 01/19/2016   CHOLHDL 4.4 01/19/2016           Hypertension:Since 1996, On amlodipine and Lotensin Blood pressure checked regularly  at home with recent reading 128/79-86  BP Readings from Last 3 Encounters:  03/15/16 128/84  02/16/16 130/84  02/03/16 (!) 162/102    Most recent eye exam was 4/17  Most recent foot exam: 10/17  ED   LABS:  Lab on 03/12/2016  Component Date Value Ref Range Status  . Sodium 03/12/2016 137  135 - 145 mEq/L Final  . Potassium 03/12/2016 4.1  3.5 - 5.1 mEq/L Final  . Chloride 03/12/2016 102  96 - 112 mEq/L Final  . CO2 03/12/2016 26  19 - 32 mEq/L Final  . Glucose, Bld 03/12/2016 112* 70 - 99 mg/dL Final  . BUN 03/12/2016 22  6 - 23 mg/dL Final  . Creatinine, Ser 03/12/2016 0.94  0.40 - 1.50 mg/dL Final  . Calcium 03/12/2016 9.4  8.4 - 10.5 mg/dL Final  . GFR 03/12/2016 89.35  >60.00 mL/min Final  . Fructosamine 03/13/2016 283  0 - 285 umol/L Final   Comment: Published reference interval for apparently healthy subjects between age 29 and 49 is 47 - 285 umol/L and in a poorly controlled diabetic population is 228 - 563 umol/L with a mean of 396 umol/L.     Physical Examination:  BP 128/84   Pulse 84    Ht 5' 9"  (1.753 m)   Wt 233 lb (105.7 kg)   SpO2 94%   BMI 34.41 kg/m         ASSESSMENT:  Diabetes type 2, BMI 34 See history of present illness for detailed discussion of current diabetes management, blood sugar patterns and problems identified  Although he has had a baseline A1c of 12% before starting his medications he appears to have had improved blood sugar control recently.   He is checking his blood sugars infrequently especially in the last 2 weeks. Fructosamine indicates overall fair control and last couple of readings are near normal, however not checking postprandial readings He does need to lose more weight. He is doing somewhat better with his diet, has been asked to see a dietitian at the [facility]: Still has high normal blood pressure readings   Erectile dysfunction: He is desiring treatment, likely to be from diabetes  LIPIDS: Has last LDL was over 100, not on treatment  PLAN:     He will  continue his 2 drug treatment for management of his diabetes  He probably  will have better blood sugar control with continued improvement in diet and consistent medications  He can try taking 2 tablets of metformin after breakfast and another 2 after dinner for maximum benefit  Need to check blood sugars more consistently especially after meals Given guidelines on blood sugar testing and targets Check blood pressure consistently and will reassess adjustment of his medications on the next visit if not improved Will also need follow-up LIPIDS since he has not had an LDL of under 100. He can try Generic sildenafil for erectile dysfunction, discussed how this works, timing of medication, possible side effects, starting dosage and maximum dose of 100 mg Check A1c in 2 months   Patient Instructions  Check blood sugars on waking up  2x weekly  Also check blood sugars about 2 hours after a meal and do this after different meals by rotation  Recommended blood  sugar levels on waking up is 90-130 and about 2 hours after meal is 130-160  Please bring your blood sugar monitor to each visit, thank you  Sildenafil 3-4 tabs at a time   Counseling time on subjects discussed above is over 50% of today's 25 minute visit   Consultation note has been sent to the referring physician  [facility] 03/15/2016, 3:06 PM   Note: This office note was prepared with Dragon voice recognition system technology. Any transcriptional errors that result from this process are unintentional.

## 2016-03-16 ENCOUNTER — Encounter: Payer: Self-pay | Admitting: Endocrinology

## 2016-04-20 ENCOUNTER — Other Ambulatory Visit: Payer: BC Managed Care – PPO | Admitting: Internal Medicine

## 2016-04-20 DIAGNOSIS — E119 Type 2 diabetes mellitus without complications: Secondary | ICD-10-CM

## 2016-04-21 ENCOUNTER — Other Ambulatory Visit: Payer: Self-pay | Admitting: Internal Medicine

## 2016-04-21 LAB — HEMOGLOBIN A1C
HEMOGLOBIN A1C: 6.6 % — AB (ref <5.7)
MEAN PLASMA GLUCOSE: 143 mg/dL

## 2016-04-22 ENCOUNTER — Ambulatory Visit (INDEPENDENT_AMBULATORY_CARE_PROVIDER_SITE_OTHER): Payer: BC Managed Care – PPO | Admitting: Internal Medicine

## 2016-04-22 ENCOUNTER — Other Ambulatory Visit: Payer: BC Managed Care – PPO | Admitting: Internal Medicine

## 2016-04-22 ENCOUNTER — Encounter: Payer: Self-pay | Admitting: Internal Medicine

## 2016-04-22 VITALS — BP 164/92 | HR 82 | Temp 98.6°F | Ht 69.0 in | Wt 235.0 lb

## 2016-04-22 DIAGNOSIS — I10 Essential (primary) hypertension: Secondary | ICD-10-CM

## 2016-04-22 DIAGNOSIS — E119 Type 2 diabetes mellitus without complications: Secondary | ICD-10-CM

## 2016-04-22 DIAGNOSIS — E781 Pure hyperglyceridemia: Secondary | ICD-10-CM

## 2016-04-22 MED ORDER — BENAZEPRIL HCL 40 MG PO TABS: 40.0000 mg | ORAL_TABLET | Freq: Every day | ORAL | 0 refills | Status: DC

## 2016-04-22 MED ORDER — AMLODIPINE BESYLATE 5 MG PO TABS: 5.0000 mg | ORAL_TABLET | Freq: Every day | ORAL | 0 refills | Status: DC

## 2016-04-22 MED ORDER — AMLODIPINE BESYLATE 10 MG PO TABS: 10.0000 mg | ORAL_TABLET | Freq: Every day | ORAL | 0 refills | Status: DC

## 2016-04-22 MED FILL — AMLODIPINE BESYLATE 10 MG T: 10 | 30 days supply | Qty: 30 | Fill #0

## 2016-04-22 MED FILL — BENAZEPRIL HCL 40 MG TABLET: 40 | 90 days supply | Qty: 90 | Fill #0

## 2016-04-22 NOTE — Progress Notes (Signed)
   Subjective:    Patient ID: Kyle Dawson, male    DOB: 1966-04-28, 50 y.o.   MRN: [id number]  HPI   50 year old Male for follow up of diabetes and HTN. Dr. Dwyane Dee has placed him on Jardiance and Metformin 1000 mg twice a day.  Has changed diet. No fast food. No sodas.  Hgb AIC has improved from 12 %  to 6.6 %  BP elevated 164/92 Weight today 235 pounds and was 251 pounds March 2016  Currently on Lotensin 40 mg daily  and 5 mg amlodipine  Parents with HTN.  Used to have LE edema and used to take Lasix with higher dose of Norvasc  Review of Systems See above    Objective:   Physical Exam  BP 170/90 today. Neck is supple without JVD thyromegaly or carotid bruits. Chest clear to auscultation. Cardiac exam regular rate and rhythm. No lower extremity pitting edema      Assessment & Plan:  Essential hypertension-control needs to be better  Obesity-continue diet and exercise  Hyperlipidemia-on statin therapy. Lipids not checked with this visit.  Diabetes mellitus-Dr Dwyane Dee now has him adequately controlled  Plan: Increase amlodipine to 10 mg daily. Likely will need Lasix 20 mg daily. He is to return next week for follow-up. Continue Lotensin.

## 2016-04-23 ENCOUNTER — Ambulatory Visit: Payer: BC Managed Care – PPO | Admitting: Internal Medicine

## 2016-04-23 MED FILL — JARDIANCE 10 MG TABLET: 10 | 30 days supply | Qty: 30 | Fill #2

## 2016-04-23 MED FILL — METFORMIN HCL ER 500 MG TAB: 500 | 30 days supply | Qty: 120 | Fill #2

## 2016-04-26 ENCOUNTER — Telehealth: Payer: Self-pay | Admitting: Internal Medicine

## 2016-04-26 NOTE — Telephone Encounter (Signed)
Dr. Renold Genta advised that she's unable to see anything in Care Everywhere; apparently the notes have not been completed from the patient's ED visit on Saturday.

## 2016-04-26 NOTE — Telephone Encounter (Signed)
Left voice mail message with pt. Cannot see anything in Care Everywhere

## 2016-04-26 NOTE — Telephone Encounter (Signed)
Had a little episode on Saturday.  Went to the ED - advised you could see it in [facility].  He wanted you to be aware so you could see it prior to his appointment on Wednesday of this week.    Thank you.

## 2016-04-28 ENCOUNTER — Ambulatory Visit (INDEPENDENT_AMBULATORY_CARE_PROVIDER_SITE_OTHER): Payer: BC Managed Care – PPO | Admitting: Internal Medicine

## 2016-04-28 ENCOUNTER — Encounter: Payer: Self-pay | Admitting: Internal Medicine

## 2016-04-28 VITALS — BP 132/82 | HR 86 | Temp 97.8°F | Wt 233.0 lb

## 2016-04-28 DIAGNOSIS — G459 Transient cerebral ischemic attack, unspecified: Secondary | ICD-10-CM | POA: Diagnosis not present

## 2016-04-28 DIAGNOSIS — E119 Type 2 diabetes mellitus without complications: Secondary | ICD-10-CM

## 2016-04-28 DIAGNOSIS — I10 Essential (primary) hypertension: Secondary | ICD-10-CM | POA: Diagnosis not present

## 2016-04-28 LAB — COMPLETE METABOLIC PANEL WITH GFR
ALT: 56 U/L — AB (ref 9–46)
AST: 37 U/L — ABNORMAL HIGH (ref 10–35)
Albumin: 4.3 g/dL (ref 3.6–5.1)
Alkaline Phosphatase: 47 U/L (ref 40–115)
BUN: 18 mg/dL (ref 7–25)
CALCIUM: 9.6 mg/dL (ref 8.6–10.3)
CHLORIDE: 98 mmol/L (ref 98–110)
CO2: 20 mmol/L (ref 20–31)
CREATININE: 0.88 mg/dL (ref 0.70–1.33)
GFR, Est Non African American: >89 mL/min (ref >=60)
Glucose, Bld: 105 mg/dL — ABNORMAL HIGH (ref 65–99)
POTASSIUM: 4.2 mmol/L (ref 3.5–5.3)
Sodium: 136 mmol/L (ref 135–146)
Total Bilirubin: 1.1 mg/dL (ref 0.2–1.2)
Total Protein: 6.5 g/dL (ref 6.1–8.1)

## 2016-04-28 LAB — POCT URINALYSIS DIPSTICK
BILIRUBIN UA: NEGATIVE
Leukocytes, UA: NEGATIVE
Nitrite, UA: NEGATIVE
Protein, UA: NEGATIVE
RBC UA: NEGATIVE
SPEC GRAV UA: 1.02
UROBILINOGEN UA: NEGATIVE
pH, UA: 5

## 2016-04-28 LAB — CBC WITH DIFFERENTIAL/PLATELET
Basophils Absolute: 0 cells/uL (ref 0–200)
Basophils Relative: 0 %
EOS ABS: 63 {cells}/uL (ref 15–500)
Eosinophils Relative: 1 %
HEMATOCRIT: 50.1 % — AB (ref 38.5–50.0)
Hemoglobin: 17.5 g/dL — ABNORMAL HIGH (ref 13.2–17.1)
LYMPHS PCT: 19 %
Lymphs Abs: 1197 cells/uL (ref 850–3900)
MCH: 32.8 pg (ref 27.0–33.0)
MCHC: 34.9 g/dL (ref 32.0–36.0)
MCV: 93.8 fL (ref 80.0–100.0)
MPV: 10 fL (ref 7.5–12.5)
Monocytes Absolute: 756 cells/uL (ref 200–950)
Monocytes Relative: 12 %
NEUTROS PCT: 68 %
Neutro Abs: 4284 cells/uL (ref 1500–7800)
Platelets: 220 10*3/uL (ref 140–400)
RBC: 5.34 MIL/uL (ref 4.20–5.80)
RDW: 14.1 % (ref 11.0–15.0)
WBC: 6.3 10*3/uL (ref 3.8–10.8)

## 2016-04-28 LAB — LIPID PANEL
CHOLESTEROL: 164 mg/dL (ref <200)
HDL: 62 mg/dL (ref >40)
LDL Cholesterol: 79 mg/dL (ref <100)
Total CHOL/HDL Ratio: 2.6 Ratio (ref <5.0)
Triglycerides: 113 mg/dL (ref <150)
VLDL: 23 mg/dL (ref <30)

## 2016-04-28 MED FILL — ATORVASTATIN 80 MG TABLET: 80 | 20 days supply | Qty: 20 | Fill #0

## 2016-04-28 NOTE — Progress Notes (Signed)
Subjective:    Patient ID: Kyle Dawson, male    DOB: [birth year], 50 y.o.   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 A coworker suggested he check his blood pressure and it was elevated significantly in the 027 systolic range. He was subsequently checked into the emergency department and later transferred to [facility] in [place]. At one point he commented that a neurology resident told him he had a left facial droop. He noticed there was a couple of minutes when he really could not get his words out.  Patient has a history of hypertension, diabetes mellitus, obesity.  He has not been monitoring his blood pressure at home on a regular basis.  Dr. [name] is his endocrinologist.  He was seen here December 14 and his hemoglobin A1c had improved from 12% to 6.6% after Dr. [name] placed him on  Jardiance  and metformin 1000 mg twice daily.   When he was here on December 14, blood pressure was elevated at 164/92. I increased his amlodipine from 5-10 mg daily. He does have a history of dependent edema and he was told to take Lasix 20 mg daily if he developed dependent edema on increased dose of Norvasc which is common. He was to follow-up this week.  At [facility] he had an MRA of carotids with and without contrast as well as MRI of the brain with and without contrast. that showed a 50% stenosis of the left common carotid artery. No other lesions were noted. He was placed on aspirin 81 mg daily. I have increased this to 325 mg daily. He was also told to take Lipitor 80 mg daily. Previously had not been on statin medication. He has yet to get this filled. Blood pressure in the emergency department was 191/98. He felt nauseated.  Still feels a bit nauseated at times. He wondered if this was some reaction to IV contrast. For that reason I did check kidney functions today and they proved to be normal. He was given  some IV fluids in the emergency department.  Today he is fasting. I have drawn fasting lipid panel and sedimentation rate. Hemoglobin A1c was drawn and is 6.3% and previously was 6.6%. Lipid panel is normal. He is yet start statin therapy. CBC is within normal limits except for hemoglobin 17.5 g but he is fasting and slightly volume depleted from that. Point-of-care urine testing showed 4+ glucosuria.  He feels fine and has had no recurrence of symptoms. Unfortunately he cannot take time off from work. He scheduled to work December 22 through the 25th. Says he cannot afford to take time off and has promised to do this as coverage to other coworkers for the holidays.  No 2-D echocardiogram was done at [facility].  Today blood pressure stable at 132/82. Weight is 200 early 3 pounds. Pulse oximetry 97%. BMI is 34.41. He did weigh 252 pounds in April 2016 so he's lost some  weight over the past year or so.      Review of Systems see above     Objective:   Physical Exam Skin warm and dry. Nodes none. PERRLA. Funduscopic exam is benign. Disc are sharp and flat bilaterally. TMs are clear. Tongue is midline. No facial asymmetry. Chest clear. Cardiac exam regular rate and rhythm. No carotid bruits appreciated. No focal deficits on brief neurological exam.  Assessment & Plan:  Essential hypertension-stable with increased dose of amlodipine  Recent TIA-to see neurologist tomorrow for further evaluation and recommendations.  Obesity  Diabetes mellitus-stable and under good control  Plan: Patient has appointment see Dr. [name] December 21. Would like his opinion on carotid stenosis and further management. I told patient to take 325 mg of aspirin daily instead of 81 mg daily. I agree with starting statin medication in the setting although his lipid panel is normal. Continue follow-up with Dr. [name] for diabetic management.  45 minutes spent with patient.

## 2016-04-29 ENCOUNTER — Encounter: Payer: Self-pay | Admitting: Neurology

## 2016-04-29 ENCOUNTER — Ambulatory Visit (INDEPENDENT_AMBULATORY_CARE_PROVIDER_SITE_OTHER): Payer: BC Managed Care – PPO | Admitting: Neurology

## 2016-04-29 VITALS — BP 129/74 | HR 93 | Resp 18 | Ht 69.0 in | Wt 233.0 lb

## 2016-04-29 DIAGNOSIS — G451 Carotid artery syndrome (hemispheric): Secondary | ICD-10-CM | POA: Insufficient documentation

## 2016-04-29 LAB — HEMOGLOBIN A1C
Hgb A1c MFr Bld: 6.3 % — ABNORMAL HIGH (ref <5.7)
MEAN PLASMA GLUCOSE: 134 mg/dL

## 2016-04-29 LAB — SEDIMENTATION RATE: SED RATE: 7 mm/h (ref 0–20)

## 2016-04-29 NOTE — Patient Instructions (Signed)
Please keep appointment with Dr. Floyde Parkins December 21 at 4 PM. Take aspirin 325 mg daily and start statin therapy.

## 2016-04-29 NOTE — Patient Instructions (Signed)
   Stay on aspirin 325 mg, we will get 2D echo.

## 2016-04-29 NOTE — Progress Notes (Signed)
Reason for visit: TIA  Referring physician: Dr. Ala Bent Kyle Dawson is a 50 y.o. male  History of present illness:  Mr. Kyle Dawson is a 50 year old right-handed white male with a history of diabetes and hypertension. On 04/24/2016, the patient was at work when he began noting some problems with feeling shaky and jittery and dizzy. The patient indicates that he did not feel well that morning prior to going to work as he felt somewhat cloudy headed. The patient had his blood pressure taken at the onset of symptoms and it was noted to be 164/90. His blood glucose was 164. The patient had sweats and diaphoresis. He then had a brief 15 second episode of difficulty getting his words out. He later on developed a right temporal headache associated was some slight nausea. The patient had no photophobia or phonophobia with the headache. The patient was given Reglan for the nausea. Given the episode of transient speech alteration, there was a concern for a stroke or TIA, and the patient was transferred to another emergency room and he underwent a workup that included a MRI of the brain that was normal, MRA evaluation showed a 50% stenosis of the left internal carotid artery, otherwise the study was unremarkable. A 2-D echocardiogram was not performed. The patient was changed from 81 mg of aspirin to 325 mg of aspirin. The patient has done well since that time. The patient denies any pre-existing history of migraine headache. He did not report any numbness or weakness of extremities, vision changes, or changes with swallowing. He has not had any alteration in balance or any falls or difficulty controlling the bowels or the bladder. He is sent to this office for an evaluation. At one point during his stroke evaluation, he was noted to have a blood pressure over 803 systolic.  Past Medical History:  Diagnosis Date  . Basal cell carcinoma   . Chest pain   . Edema    lower extremity  . Hypertension   .  Squamous cell carcinoma    R ear, nose, each side of face    Past Surgical History:  Procedure Laterality Date  . Removal basal cell carcinoma    . Removal squamous cell carcinoma      Family History  Problem Relation Age of Onset  . Peripheral vascular disease Mother 69  . Hypertension Mother   . Heart failure Mother   . CAD Maternal Grandfather   . Heart failure Paternal Grandfather   . Hypertension Father     Social history:  reports that he has never smoked. He has never used smokeless tobacco. He reports that he drinks alcohol. He reports that he does not use drugs.  Medications:  Prior to Admission medications   Medication Sig Start Date End Date Taking? Authorizing Provider  amLODipine (NORVASC) 10 MG tablet Take 1 tablet (10 mg total) by mouth daily. 04/22/16  Yes Elby Showers, MD  aspirin 81 MG tablet Take 81 mg by mouth daily.     Yes Historical Provider, MD  atorvastatin (LIPITOR) 80 MG tablet Take 80 mg by mouth daily.   Yes Historical Provider, MD  B Complex-C (B-50 COMPLEX/VITAMIN C) TABS Take 2 tablets by mouth 2 (two) times daily.     Yes Historical Provider, MD  benazepril (LOTENSIN) 40 MG tablet Take 1 tablet (40 mg total) by mouth daily. 04/22/16  Yes Elby Showers, MD  Calcium Carbonate-Vit D-Min (CALCIUM 1200 PO) Take by mouth daily.  Yes Historical Provider, MD  cholecalciferol (VITAMIN D) 1000 UNITS tablet Take 1,000 Units by mouth daily.     Yes Historical Provider, MD  empagliflozin (JARDIANCE) 10 MG TABS tablet Take 10 mg by mouth daily with breakfast. 02/16/16  Yes Elayne Snare, MD  fish oil-omega-3 fatty acids 1000 MG capsule Take 2 g by mouth daily.     Yes Historical Provider, MD  furosemide (LASIX) 20 MG tablet One or two daily for dependent edema 02/03/16  Yes Elby Showers, MD  glucose blood (ONETOUCH VERIO) test strip Use as instructed to check blood sugar once a day dx code E11.9 02/16/16  Yes Elayne Snare, MD  ibuprofen (ADVIL,MOTRIN) 800 MG tablet  Take 1 tablet (800 mg total) by mouth 3 (three) times daily. 09/02/13  Yes Larene Pickett, PA-C  LevOCARNitine (CARNITINE PO) Take by mouth 2 (two) times daily.     Yes Historical Provider, MD  metFORMIN (GLUCOPHAGE-XR) 500 MG 24 hr tablet Take 4 tablets (2,000 mg total) by mouth daily with supper. 02/16/16  Yes Elayne Snare, MD  NON FORMULARY Tumeric   Yes Historical Provider, MD  Jonetta Speak LANCETS 76P MISC Use to check blood sugar once a day dx code E11.9 02/16/16  Yes Elayne Snare, MD  Resveratrol 50 MG CAPS Take by mouth.   Yes Historical Provider, MD  Saw Palmetto, Serenoa repens, 450 MG CAPS Take by mouth daily.     Yes Historical Provider, MD  sildenafil (REVATIO) 20 MG tablet As directed 03/15/16  Yes Elayne Snare, MD  VITAMIN A PO Take 500 Units by mouth daily.    Yes Historical Provider, MD  vitamin E 400 UNIT capsule Take 400 Units by mouth daily.     Yes Historical Provider, MD  Zinc 50 MG TABS Take 2 tablets by mouth 2 (two) times daily.     Yes Historical Provider, MD     No Known Allergies  ROS:  Out of a complete 14 system review of symptoms, the patient complains only of the following symptoms, and all other reviewed systems are negative.  Fatigue Increased thirst Insomnia, snoring, shift work  Blood pressure 129/74, pulse 93, resp. rate 18, height 5' 9"  (1.753 m), weight 233 lb (105.7 kg).  Physical Exam  General: The patient is alert and cooperative at the time of the examination. The patient is moderately obese.  Eyes: Pupils are equal, round, and reactive to light. Discs are flat bilaterally.  Neck: The neck is supple, no carotid bruits are noted.  Respiratory: The respiratory examination is clear.  Cardiovascular: The cardiovascular examination reveals a regular rate and rhythm, no obvious murmurs or rubs are noted.  Skin: Extremities are without significant edema.  Neurologic Exam  Mental status: The patient is alert and oriented x 3 at the time of the  examination. The patient has apparent normal recent and remote memory, with an apparently normal attention span and concentration ability.  Cranial nerves: Facial symmetry is present, with exception of a mild depression of the left nasolabial fold. There is good sensation of the face to pinprick and soft touch bilaterally. The strength of the facial muscles and the muscles to head turning and shoulder shrug are normal bilaterally. Speech is well enunciated, no aphasia or dysarthria is noted. Extraocular movements are full. Visual fields are full. The tongue is midline, and the patient has symmetric elevation of the soft palate. No obvious hearing deficits are noted.  Motor: The motor testing reveals 5 over 5 strength  of all 4 extremities. Good symmetric motor tone is noted throughout.  Sensory: Sensory testing is intact to pinprick, soft touch, vibration sensation, and position sense on all 4 extremities. No evidence of extinction is noted.  Coordination: Cerebellar testing reveals good finger-nose-finger and heel-to-shin bilaterally.  Gait and station: Gait is normal. Tandem gait is normal. Romberg is negative. No drift is seen.  Reflexes: Deep tendon reflexes are symmetric and normal bilaterally. Toes are downgoing bilaterally.   Assessment/Plan:  1. Transient speech alteration, possible TIA  The episodes described do the patient is somewhat unusual for a TIA as he was feeling poorly for several hours prior to onset of the episode, and he began having problems with feeling jittery and with diaphoresis before the episode of speech difficulty. The patient developed a right temporal headache following the episode. The patient has no prior history of migraine, but migraine headache should be considered. The patient does have evidence of 50% stenosis of the left internal carotid artery. He has been placed on cholesterol-lowering agents and aspirin therapy has been increased. The patient will be set  up for a 2-D echocardiogram. The left carotid stenosis will be followed over time by carotid Doppler studies.   Jill Alexanders MD 04/29/2016 4:39 PM  [facility] [address] [place], [place] [address]  Phone [phone] Fax [phone]

## 2016-05-09 NOTE — Patient Instructions (Addendum)
Increase amlodipine to 10 mg daily for blood pressure control. May need Lasix with higher dose of Norvasc. Return next week for follow-up.

## 2016-05-11 MED FILL — TRIAMCINOLONE 0.1% CREAM: 0.1 | 14 days supply | Qty: 45 | Fill #0

## 2016-05-14 ENCOUNTER — Other Ambulatory Visit (INDEPENDENT_AMBULATORY_CARE_PROVIDER_SITE_OTHER): Payer: 59

## 2016-05-14 DIAGNOSIS — E1165 Type 2 diabetes mellitus with hyperglycemia: Secondary | ICD-10-CM

## 2016-05-14 LAB — LIPID PANEL
Cholesterol: 124 mg/dL (ref 0–200)
HDL: 59.3 mg/dL (ref >39.00)
LDL CALC: 32 mg/dL (ref 0–99)
NONHDL: 64.46
Total CHOL/HDL Ratio: 2
Triglycerides: 164 mg/dL — ABNORMAL HIGH (ref 0.0–149.0)
VLDL: 32.8 mg/dL (ref 0.0–40.0)

## 2016-05-14 LAB — COMPREHENSIVE METABOLIC PANEL
ALT: 58 U/L — ABNORMAL HIGH (ref 0–53)
AST: 40 U/L — ABNORMAL HIGH (ref 0–37)
Albumin: 4.7 g/dL (ref 3.5–5.2)
Alkaline Phosphatase: 50 U/L (ref 39–117)
BUN: 23 mg/dL (ref 6–23)
CHLORIDE: 99 meq/L (ref 96–112)
CO2: 29 mEq/L (ref 19–32)
Calcium: 9.8 mg/dL (ref 8.4–10.5)
Creatinine, Ser: 0.89 mg/dL (ref 0.40–1.50)
GFR: 95.1 mL/min (ref >60.00)
GLUCOSE: 102 mg/dL — AB (ref 70–99)
POTASSIUM: 3.8 meq/L (ref 3.5–5.1)
SODIUM: 138 meq/L (ref 135–145)
TOTAL PROTEIN: 7.3 g/dL (ref 6.0–8.3)
Total Bilirubin: 0.5 mg/dL (ref 0.2–1.2)

## 2016-05-14 LAB — HEMOGLOBIN A1C: HEMOGLOBIN A1C: 6.4 % (ref 4.6–6.5)

## 2016-05-17 ENCOUNTER — Encounter: Payer: Self-pay | Admitting: Endocrinology

## 2016-05-17 ENCOUNTER — Encounter: Payer: Self-pay | Admitting: Internal Medicine

## 2016-05-17 ENCOUNTER — Ambulatory Visit (INDEPENDENT_AMBULATORY_CARE_PROVIDER_SITE_OTHER): Payer: BC Managed Care – PPO | Admitting: Endocrinology

## 2016-05-17 VITALS — BP 112/72 | HR 73 | Ht 69.0 in | Wt 230.0 lb

## 2016-05-17 DIAGNOSIS — E1165 Type 2 diabetes mellitus with hyperglycemia: Secondary | ICD-10-CM | POA: Diagnosis not present

## 2016-05-17 NOTE — Progress Notes (Signed)
Patient ID: Kyle Dawson, male   DOB: 13-Jun-1965, 51 y.o.   MRN: [id number]            Reason for Appointment: Follow-up for Type 2 Diabetes  Referring physician: [place]   History of Present Illness:          Date of diagnosis of type 2 diabetes mellitus: ?  2015        Background history:   Although he thinks his blood sugars have been borderline in the past his labs include glucose readings of 183 and 315, 2 years ago He thinks he had a normal A1c in 2016 but no records are available this A1c was 6.7 in 2015.   When first seen the patient was not on any pharmacological therapy for his poorly controlled diabetes and A1c of 12%  Recent history:        His A1c is again consistently good at 6.4  Non-insulin hypoglycemic drugs the patient is taking are: Jardiance 10 mg daily, metformin ER 1500 mg daily  Current management, blood sugar patterns and problems identified:  His blood sugars are fluctuating significantly on the previous visit and now he is apparently doing much better with his diet with cutting back on high glycemic index carbohydrates, all fatty meats and eating more vegetables and organic carbohydrates  He has taken metformin twice a day but sometimes will not take it in the evening if the blood sugar is low normal which is not usual.  No side effects currently  Checking blood sugars mostly in the morning before or after breakfast  He says that the only when he is eating more carbohydrate like pasta he will have readings around 190 after eating  He is doing more calisthenics and other exercises to increase his heart rate and is doing this more regularly  Weight is about the same        Compliance with the medical regimen: Improving  Glucose monitoring:  done about 1 times a day         Glucometer:  One Touch Verio  Mean values apply above for all meters except median for One Touch  PRE-MEAL Fasting Lunch Dinner Bedtime Overall  Glucose range: 95-167         Mean/median:     147+/-35    POST-MEAL PC Breakfast PC Lunch PC Dinner  Glucose range:  147, 153  166-197   Mean/median:       Self-care: The diet that the patient has been following is: tries to limit high-fat foods and fast food .     Meal times are:  Breakfast is at 11 AM Typical meal intake: Breakfast is oatmeal               Dietician visit, most recent: None               Exercise:  indoor exercises 30 minutes, 6 times a week  Weight history: max 265  Wt Readings from Last 3 Encounters:  05/17/16 230 lb (104.3 kg)  04/29/16 233 lb (105.7 kg)  04/28/16 233 lb (105.7 kg)    Glycemic control:   Lab Results  Component Value Date   HGBA1C 6.4 05/14/2016   HGBA1C 6.3 (H) 04/28/2016   HGBA1C 6.6 (H) 04/20/2016   Lab Results  Component Value Date   MICROALBUR 5.6 01/20/2016   LDLCALC 32 05/14/2016   CREATININE 0.89 05/14/2016   Lab Results  Component Value Date   MICRALBCREAT 43 (H) 01/20/2016  Lab on 05/14/2016  Component Date Value Ref Range Status  . Hgb A1c MFr Bld 05/14/2016 6.4  4.6 - 6.5 % Final  . Sodium 05/14/2016 138  135 - 145 mEq/L Final  . Potassium 05/14/2016 3.8  3.5 - 5.1 mEq/L Final  . Chloride 05/14/2016 99  96 - 112 mEq/L Final  . CO2 05/14/2016 29  19 - 32 mEq/L Final  . Glucose, Bld 05/14/2016 102* 70 - 99 mg/dL Final  . BUN 05/14/2016 23  6 - 23 mg/dL Final  . Creatinine, Ser 05/14/2016 0.89  0.40 - 1.50 mg/dL Final  . Total Bilirubin 05/14/2016 0.5  0.2 - 1.2 mg/dL Final  . Alkaline Phosphatase 05/14/2016 50  39 - 117 U/L Final  . AST 05/14/2016 40* 0 - 37 U/L Final  . ALT 05/14/2016 58* 0 - 53 U/L Final  . Total Protein 05/14/2016 7.3  6.0 - 8.3 g/dL Final  . Albumin 05/14/2016 4.7  3.5 - 5.2 g/dL Final  . Calcium 05/14/2016 9.8  8.4 - 10.5 mg/dL Final  . GFR 05/14/2016 95.10  >60.00 mL/min Final  . Cholesterol 05/14/2016 124  0 - 200 mg/dL Final  . Triglycerides 05/14/2016 164.0* 0.0 - 149.0 mg/dL Final  . HDL 05/14/2016 59.30   >39.00 mg/dL Final  . VLDL 05/14/2016 32.8  0.0 - 40.0 mg/dL Final  . LDL Cholesterol 05/14/2016 32  0 - 99 mg/dL Final  . Total CHOL/HDL Ratio 05/14/2016 2   Final  . NonHDL 05/14/2016 64.46   Final    OTHER active problems: See review of systems   Allergies as of 05/17/2016   No Known Allergies     Medication List       Accurate as of 05/17/16 11:59 PM. Always use your most recent med list.          amLODipine 10 MG tablet Commonly known as:  NORVASC Take 1 tablet (10 mg total) by mouth daily.   aspirin 325 MG EC tablet Take 325 mg by mouth daily.   aspirin 81 MG tablet Take 81 mg by mouth daily.   atorvastatin 80 MG tablet Commonly known as:  LIPITOR Take 80 mg by mouth daily.   B-50 Complex/Vitamin C Tabs Take 2 tablets by mouth 2 (two) times daily.   benazepril 40 MG tablet Commonly known as:  LOTENSIN Take 1 tablet (40 mg total) by mouth daily.   CALCIUM 1200 PO Take by mouth daily.   CARNITINE PO Take by mouth 2 (two) times daily.   cholecalciferol 1000 units tablet Commonly known as:  VITAMIN D Take 1,000 Units by mouth daily.   empagliflozin 10 MG Tabs tablet Commonly known as:  JARDIANCE Take 10 mg by mouth daily with breakfast.   fish oil-omega-3 fatty acids 1000 MG capsule Take 2 g by mouth daily.   furosemide 20 MG tablet Commonly known as:  LASIX One or two daily for dependent edema   glucose blood test strip Commonly known as:  ONETOUCH VERIO Use as instructed to check blood sugar once a day dx code E11.9   ibuprofen 800 MG tablet Commonly known as:  ADVIL,MOTRIN Take 1 tablet (800 mg total) by mouth 3 (three) times daily.   metFORMIN 500 MG 24 hr tablet Commonly known as:  GLUCOPHAGE-XR Take 4 tablets (2,000 mg total) by mouth daily with supper.   NON FORMULARY Tumeric   ONETOUCH DELICA LANCETS 26V Misc Use to check blood sugar once a day dx code E11.9   Resveratrol  50 MG Caps Take by mouth.   Saw Palmetto (Serenoa  repens) 450 MG Caps Take by mouth daily.   sildenafil 20 MG tablet Commonly known as:  REVATIO As directed   VITAMIN A PO Take 500 Units by mouth daily.   vitamin E 400 UNIT capsule Take 400 Units by mouth daily.   Zinc 50 MG Tabs Take 2 tablets by mouth 2 (two) times daily.       Allergies:  No Known Allergies  Past Medical History:  Diagnosis Date  . Basal cell carcinoma   . Chest pain   . Edema    lower extremity  . Hypertension   . Squamous cell carcinoma    R ear, nose, each side of face    Past Surgical History:  Procedure Laterality Date  . Removal basal cell carcinoma    . Removal squamous cell carcinoma      Family History  Problem Relation Age of Onset  . Peripheral vascular disease Mother 4  . Hypertension Mother   . Heart failure Mother   . CAD Maternal Grandfather   . Heart failure Paternal Grandfather   . Hypertension Father     Social History:  reports that he has never smoked. He has never used smokeless tobacco. He reports that he drinks alcohol. He reports that he does not use drugs.   Review of Systems   Lipid history: Now on Lipitor Rx and this was apparently started because he was found to have some carotid artery atherosclerosis    Lab Results  Component Value Date   CHOL 124 05/14/2016   HDL 59.30 05/14/2016   LDLCALC 32 05/14/2016   TRIG 164.0 (H) 05/14/2016   CHOLHDL 2 05/14/2016           Has had mild increase in liver functions even prior to getting on Lipitor   Hypertension:Since 1996, On amlodipine 10 and Lotensin Blood pressure checked regularly at home with recent reading 140/70-80  BP Readings from Last 3 Encounters:  05/17/16 112/72  04/29/16 129/74  04/28/16 132/82    Most recent eye exam was 4/17  Most recent foot exam: 10/17  ED present, Treatment has been prescribed   LABS:  Lab on 05/14/2016  Component Date Value Ref Range Status  . Hgb A1c MFr Bld 05/14/2016 6.4  4.6 - 6.5 % Final  .  Sodium 05/14/2016 138  135 - 145 mEq/L Final  . Potassium 05/14/2016 3.8  3.5 - 5.1 mEq/L Final  . Chloride 05/14/2016 99  96 - 112 mEq/L Final  . CO2 05/14/2016 29  19 - 32 mEq/L Final  . Glucose, Bld 05/14/2016 102* 70 - 99 mg/dL Final  . BUN 05/14/2016 23  6 - 23 mg/dL Final  . Creatinine, Ser 05/14/2016 0.89  0.40 - 1.50 mg/dL Final  . Total Bilirubin 05/14/2016 0.5  0.2 - 1.2 mg/dL Final  . Alkaline Phosphatase 05/14/2016 50  39 - 117 U/L Final  . AST 05/14/2016 40* 0 - 37 U/L Final  . ALT 05/14/2016 58* 0 - 53 U/L Final  . Total Protein 05/14/2016 7.3  6.0 - 8.3 g/dL Final  . Albumin 05/14/2016 4.7  3.5 - 5.2 g/dL Final  . Calcium 05/14/2016 9.8  8.4 - 10.5 mg/dL Final  . GFR 05/14/2016 95.10  >60.00 mL/min Final  . Cholesterol 05/14/2016 124  0 - 200 mg/dL Final  . Triglycerides 05/14/2016 164.0* 0.0 - 149.0 mg/dL Final  . HDL 05/14/2016 59.30  >39.00 mg/dL Final  .  VLDL 05/14/2016 32.8  0.0 - 40.0 mg/dL Final  . LDL Cholesterol 05/14/2016 32  0 - 99 mg/dL Final  . Total CHOL/HDL Ratio 05/14/2016 2   Final  . NonHDL 05/14/2016 64.46   Final    Physical Examination:  BP 112/72   Pulse 73   Ht 5' 9"  (1.753 m)   Wt 230 lb (104.3 kg)   SpO2 98%   BMI 33.97 kg/m         ASSESSMENT:  Diabetes type 2, BMI 34 See history of present illness for detailed discussion of current diabetes management, blood sugar patterns and problems identified  His blood sugars are significantly improved over the last few months A1c is below 6.5 He is doing fairly well with his diet and exercise regimen and this is helping him significantly He does not always take his metformin at night because of fear of hypoglycemia Otherwise most of his blood sugars are fairly good,: He can do better with postprandial monitoring however  HYPERTENSION: Better controlled But he thinks his blood pressure is higher in the mornings  LIPIDS: Now on Lipitor 80 mg with improved levels  Abnormal liver  functions:?  Fatty liver, will defer evaluation to PCP   PLAN:     He will  continue his 2 drug treatment for management of his diabetes He will continue low dose Jardiance, may need higher dose if he has increase in A1c or significant difficulties with weight gain. Advised him to check blood sugars more after meals rather than just before his first meal of the day Follow-up in 3 months For his blood pressure he can try taking benazepril in the evening instead of morning   Patient Instructions  Stay on Metformin twice daily all the time  Lotensin at [facility] 05/18/2016, 11:54 AM   Note: This office note was prepared with Dragon voice recognition system technology. Any transcriptional errors that result from this process are unintentional.

## 2016-05-17 NOTE — Patient Instructions (Signed)
Stay on Metformin twice daily all the time  Lotensin at nite

## 2016-05-18 ENCOUNTER — Telehealth: Payer: Self-pay | Admitting: Internal Medicine

## 2016-05-18 ENCOUNTER — Other Ambulatory Visit: Payer: Self-pay | Admitting: Internal Medicine

## 2016-05-18 DIAGNOSIS — R748 Abnormal levels of other serum enzymes: Secondary | ICD-10-CM

## 2016-05-18 DIAGNOSIS — G459 Transient cerebral ischemic attack, unspecified: Secondary | ICD-10-CM

## 2016-05-18 MED ORDER — FUROSEMIDE 20 MG PO TABS: ORAL_TABLET | ORAL | 2 refills | Status: DC

## 2016-05-18 MED ORDER — ATORVASTATIN CALCIUM 80 MG PO TABS: 80.0000 mg | ORAL_TABLET | Freq: Every day | ORAL | 2 refills | Status: DC

## 2016-05-18 NOTE — Telephone Encounter (Signed)
Peer to peer phone call done. Authorization number for 2-D echocardiogram at [facility] [phone]. Diagnosis is TIA

## 2016-05-18 NOTE — Telephone Encounter (Signed)
Call to [facility] for authorization for ECHO.  Authorization denied [phone].  Please call for peer to peer.  Ref [id number]

## 2016-05-18 NOTE — Telephone Encounter (Signed)
Informed Kendra at [facility] of echo authorization.

## 2016-05-18 NOTE — Telephone Encounter (Signed)
Dr. Dwyane Dee is concerned about patient's mild elevation of liver functions. He thinks it may be fatty liver and I would agree. We will schedule ultrasound of the liver to look for steatosis.

## 2016-05-18 NOTE — Progress Notes (Signed)
Most likely. We can do ultrasound.

## 2016-05-18 NOTE — Telephone Encounter (Signed)
Lisinopril and lasix have been refilled.  Echo is schedule.  Mychart message sent to patient informing him.

## 2016-05-19 ENCOUNTER — Other Ambulatory Visit: Payer: Self-pay | Admitting: Internal Medicine

## 2016-05-19 ENCOUNTER — Ambulatory Visit (HOSPITAL_COMMUNITY): Admission: RE | Admit: 2016-05-19 | Payer: BC Managed Care – PPO | Source: Ambulatory Visit

## 2016-05-23 ENCOUNTER — Other Ambulatory Visit: Payer: Self-pay | Admitting: Endocrinology

## 2016-05-23 MED ORDER — SILDENAFIL CITRATE 20 MG PO TABS: ORAL_TABLET | ORAL | 1 refills | Status: DC

## 2016-06-03 MED FILL — METFORMIN HCL ER 500 MG TAB: 500 | 30 days supply | Qty: 120 | Fill #3

## 2016-06-03 MED FILL — SILDENAFIL 20 MG TABLET: 20 | 30 days supply | Qty: 30 | Fill #0

## 2016-06-03 MED FILL — JARDIANCE 10 MG TABLET: 10 | 30 days supply | Qty: 30 | Fill #3

## 2016-06-03 MED FILL — FUROSEMIDE 20 MG TABLET: 20 | 90 days supply | Qty: 180 | Fill #0

## 2016-06-10 ENCOUNTER — Telehealth: Payer: Self-pay

## 2016-06-10 NOTE — Telephone Encounter (Signed)
Spoke to pt he said he didn't know know he was supposed to have an US done he said he has to look through some things and call [facility] himself to schedule an appt for a liver US. He said due to financial situations he was unable to get the ECHO done.

## 2016-06-10 NOTE — Telephone Encounter (Signed)
Spoke to pt he said he didn't know know he was supposed to have an US done he said he has to look through some things and call [facility] himself to schedule an appt for a liver US. He said due to financial situations he was unable to get the ECHO done.

## 2016-06-14 ENCOUNTER — Inpatient Hospital Stay: Admission: RE | Admit: 2016-06-14 | Payer: BC Managed Care – PPO | Source: Ambulatory Visit

## 2016-07-20 ENCOUNTER — Ambulatory Visit (INDEPENDENT_AMBULATORY_CARE_PROVIDER_SITE_OTHER): Payer: BC Managed Care – PPO | Admitting: Internal Medicine

## 2016-07-20 ENCOUNTER — Encounter: Payer: Self-pay | Admitting: Internal Medicine

## 2016-07-20 VITALS — BP 160/104 | HR 92 | Temp 98.2°F | Ht 69.0 in | Wt 227.0 lb

## 2016-07-20 DIAGNOSIS — E119 Type 2 diabetes mellitus without complications: Secondary | ICD-10-CM | POA: Diagnosis not present

## 2016-07-20 DIAGNOSIS — I10 Essential (primary) hypertension: Secondary | ICD-10-CM

## 2016-07-20 MED ORDER — FUROSEMIDE 40 MG PO TABS: 40.0000 mg | ORAL_TABLET | Freq: Every day | ORAL | 0 refills | Status: DC

## 2016-07-20 MED ORDER — OLMESARTAN MEDOXOMIL 40 MG PO TABS: 40.0000 mg | ORAL_TABLET | Freq: Every day | ORAL | 0 refills | Status: DC

## 2016-07-20 MED FILL — OLMESARTAN MEDOXOMIL 40 MG: 40 | 30 days supply | Qty: 30 | Fill #0

## 2016-07-20 NOTE — Progress Notes (Signed)
   Subjective:    Patient ID: Kyle Dawson, male    DOB: [birth year], 51 y.o.   MRN: [id number]  HPI 51 year old white male who had possible TIA versus migraine December 2017. He had a complete workup at [facility] and subsequently saw Dr. Jannifer Franklin. Dr. Jannifer Franklin recommended annual carotid Doppler. He had a 50% left ICA stenosis. He also recommended a 2-D echocardiogram which patient has not done yet due to expense. He has a long-standing history of hypertension.   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 He worked 36 hours over 3 days and got off around 5 AM yesterday. He went to bed for a while. He had noticed his blood pressure was not running very well at work and he was sweating a lot.  Patient has not been taking furosemide on a regular basis. He thought he could back off on it if he did not have significant edema. I thought he was taking 20 mg daily. In January Lasix 20 mg was refilled for 90 days one or 2 tablets daily.  He is on 10 mg of amlodipine daily which can cause some fluid retention.  He also has diabetes mellitus and is followed by Dr. Dwyane Dee. His recent hemoglobin A1c was 6.4% January 5.  He's been on Lotensin and amlodipine for hypertension.  There were some issues with blood pressure control surrounding his TIA/migraine in December. However his blood pressure in early January with Dr. Dwyane Dee saw him was excellent at 112/72. When I saw him December 20 his blood pressure was good at 132/82. However, when he was seen on December 14 his blood pressure was 162/92.    Review of Systems not complaining of headache. Says main complaint is sweating and elevated blood pressure. No chest pain.     Objective:   Physical Exam Skin warm and dry. Nodes none. Neck is supple without JVD thyromegaly or carotid bruits. Chest clear to auscultation. Cardiac exam regular rate and rhythm normal S1 and S2. No pitting lower extremity edema. He is alert and oriented 3 and has no focal  deficits on brief neurological exam.       Assessment & Plan:  Poorly controlled hypertension  History of TIA versus migraine  Controlled type 2 diabetes mellitus  Plan: Discontinue Lotensin and place him on Benicar 40 mg daily. Take furosemide 40 mg daily and Norvasc 10 mg daily. Monitor blood pressure twice daily. Return in one week. Encourage him to have 2-D echocardiogram in the near future. Basic metabolic panel drawn today.

## 2016-07-20 NOTE — Patient Instructions (Signed)
Discontinue Lotensin. Add Benicar 40 mg daily to Lasix 40 mg daily. Continue Norvasc 10 mg daily. Return in one week. Basic metabolic panel collected. Monitor blood pressure twice daily.

## 2016-07-21 LAB — BASIC METABOLIC PANEL
BUN: 13 mg/dL (ref 7–25)
CALCIUM: 10.1 mg/dL (ref 8.6–10.3)
CO2: 29 mmol/L (ref 20–31)
CREATININE: 0.97 mg/dL (ref 0.70–1.33)
Chloride: 99 mmol/L (ref 98–110)
Glucose, Bld: 141 mg/dL — ABNORMAL HIGH (ref 65–99)
Potassium: 4.1 mmol/L (ref 3.5–5.3)
Sodium: 142 mmol/L (ref 135–146)

## 2016-07-21 MED FILL — ATORVASTATIN 80 MG TABLET: 80 | 90 days supply | Qty: 90 | Fill #0

## 2016-07-27 ENCOUNTER — Encounter: Payer: Self-pay | Admitting: Internal Medicine

## 2016-07-27 ENCOUNTER — Ambulatory Visit (INDEPENDENT_AMBULATORY_CARE_PROVIDER_SITE_OTHER): Payer: BC Managed Care – PPO | Admitting: Internal Medicine

## 2016-07-27 VITALS — BP 130/90 | HR 80 | Temp 98.3°F | Ht 69.0 in | Wt 227.0 lb

## 2016-07-27 DIAGNOSIS — I10 Essential (primary) hypertension: Secondary | ICD-10-CM | POA: Diagnosis not present

## 2016-07-27 NOTE — Patient Instructions (Signed)
Continue current regimen and return in 4 weeks. If symptoms such as lightheadedness persist, please call us.

## 2016-07-27 NOTE — Progress Notes (Signed)
   Subjective:    Patient ID: Kyle Dawson, male    DOB: 1966-03-04, 51 y.o.   MRN: [id number]  HPI Patient was here March 13 with poorly controlled hypertension. Blood pressure at that time was 160/104 with pulse of 92. At that time, he was not taking furosemide on a regular basis. Lotensin was discontinued. He was placed on Benicar 40 mg daily and was told take furosemide 40 mg daily Norvasc 10 mg daily. His blood pressure has improved considerably. Today it is 130/90. He says his been running lower than that at work.  Admits to feeling somewhat dizzy with standing up quickly at work. A bit lightheaded at times. No falls.  He was alarmed about his blood pressure at last visit due to history of possible TIA.  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  Says mother is having left carotid endarterectomy in [place] in the next couple of weeks.    Review of Systems see above     Objective:   Physical Exam Neck without bruits. He does have a history of 50% stenosis left internal carotid artery. Chest clear. Cardiac exam regular rate and rhythm. No pitting edema of the lower extremities.       Assessment & Plan:  Essential hypertension  I think he's having some orthostasis with standing up quickly with new medication regimen.  50% left internal carotid artery stenosis  Plan: He will continue current regimen. He will avoid going rapidly from a sitting to standing position. He will stay well hydrated. I'll see him again in 4 weeks. At that time if we keep him on this regimen he'll have a basic metabolic panel. He will call in the interim if he has other concerns.

## 2016-07-30 ENCOUNTER — Telehealth: Payer: Self-pay | Admitting: *Deleted

## 2016-07-30 NOTE — Telephone Encounter (Signed)
Per Butch Penny Craven---05/18/16  Cancel Rsn: Patient (Can not afford right now.)

## 2016-08-20 ENCOUNTER — Encounter: Payer: Self-pay | Admitting: Endocrinology

## 2016-08-24 ENCOUNTER — Encounter: Payer: Self-pay | Admitting: Endocrinology

## 2016-08-25 ENCOUNTER — Other Ambulatory Visit: Payer: Self-pay

## 2016-08-25 MED ORDER — EMPAGLIFLOZIN 10 MG PO TABS: 10.0000 mg | ORAL_TABLET | Freq: Every day | ORAL | 3 refills | Status: DC

## 2016-08-25 MED ORDER — METFORMIN HCL ER 500 MG PO TB24: 2000.0000 mg | ORAL_TABLET | Freq: Every day | ORAL | 3 refills | Status: DC

## 2016-08-25 MED FILL — METFORMIN HCL ER 500 MG TAB: 500 | 30 days supply | Qty: 120 | Fill #0

## 2016-08-25 MED FILL — JARDIANCE 10 MG TABLET: 10 | 30 days supply | Qty: 30 | Fill #0

## 2016-08-27 ENCOUNTER — Other Ambulatory Visit: Payer: Self-pay | Admitting: Internal Medicine

## 2016-08-27 ENCOUNTER — Encounter: Payer: Self-pay | Admitting: Internal Medicine

## 2016-08-27 ENCOUNTER — Ambulatory Visit (INDEPENDENT_AMBULATORY_CARE_PROVIDER_SITE_OTHER): Payer: BC Managed Care – PPO | Admitting: Internal Medicine

## 2016-08-27 VITALS — BP 160/100 | HR 70 | Temp 98.4°F | Wt 228.0 lb

## 2016-08-27 DIAGNOSIS — I10 Essential (primary) hypertension: Secondary | ICD-10-CM

## 2016-08-27 LAB — BASIC METABOLIC PANEL
BUN: 10 mg/dL (ref 7–25)
CALCIUM: 9.6 mg/dL (ref 8.6–10.3)
CHLORIDE: 101 mmol/L (ref 98–110)
CO2: 27 mmol/L (ref 20–31)
CREATININE: 0.92 mg/dL (ref 0.70–1.33)
GLUCOSE: 189 mg/dL — AB (ref 65–99)
Potassium: 3.8 mmol/L (ref 3.5–5.3)
Sodium: 142 mmol/L (ref 135–146)

## 2016-08-27 MED ORDER — FUROSEMIDE 40 MG PO TABS: 40.0000 mg | ORAL_TABLET | Freq: Every day | ORAL | 0 refills | Status: DC

## 2016-08-27 MED ORDER — OLMESARTAN MEDOXOMIL 40 MG PO TABS: 40.0000 mg | ORAL_TABLET | Freq: Every day | ORAL | 0 refills | Status: DC

## 2016-08-27 MED ORDER — NEBIVOLOL HCL 5 MG PO TABS: 5.0000 mg | ORAL_TABLET | Freq: Every day | ORAL | 0 refills | Status: DC

## 2016-08-27 MED ORDER — AMLODIPINE BESYLATE 10 MG PO TABS: 10.0000 mg | ORAL_TABLET | Freq: Every day | ORAL | 0 refills | Status: DC

## 2016-08-27 MED FILL — AMLODIPINE BESYLATE 10 MG T: 10 | 90 days supply | Qty: 90 | Fill #0

## 2016-08-27 MED FILL — ONETOUCH VERIO TEST STRIP: 90 days supply | Qty: 100 | Fill #1

## 2016-08-27 MED FILL — FUROSEMIDE 40 MG TABLET: 40 | 90 days supply | Qty: 90 | Fill #0

## 2016-08-27 MED FILL — SILDENAFIL 20 MG TABLET: 20 | 30 days supply | Qty: 30 | Fill #1

## 2016-08-27 MED FILL — OLMESARTAN MEDOXOMIL 40 MG: 40 | 90 days supply | Qty: 90 | Fill #0

## 2016-08-27 MED FILL — ONE TOUCH DELICA 33G LANCET: 90 days supply | Qty: 100 | Fill #1

## 2016-08-27 MED FILL — BYSTOLIC 5 MG TABLET: 5 | 30 days supply | Qty: 30 | Fill #0

## 2016-08-27 NOTE — Progress Notes (Signed)
   Subjective:    Patient ID: Kyle Dawson, male    DOB: [birth year], 51 y.o.   MRN: [id number]  HPI   Says he is still having issues with elevated blood pressure. He was not taking Lasix on a regular basis when he was here in March. He's been taking it more regularly. He has been placed on Benicar 40 mg daily, furosemide 40 mg daily, Norvasc 10 mg daily.    Review of Systems     Objective:   Physical Exam Blood pressure today is 160/100. Skin warm and dry. Chest clear. Cardiac exam regular rate and rhythm, trace lower extremity edema       Assessment & Plan:  Essential hypertension-control could be better. Add Bystolic 5 mg daily to current regimen of Lasix, Benicar, Norvasc  Plan: Basic metabolic panel drawn today on Benicar and he has appointment see Dr. 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  He remains on Lipitor 80 mg daily.  Norvasc 10 mg daily may be aggravating his edema. However he seem to need this to control his blood pressure. We're going to add Bystolic 5 mg daily to his blood pressure regimen.

## 2016-09-02 MED ORDER — OLMESARTAN MEDOXOMIL 40 MG PO TABS: 40.0000 mg | ORAL_TABLET | Freq: Every day | ORAL | 5 refills | Status: DC

## 2016-09-02 MED ORDER — AMLODIPINE BESYLATE 10 MG PO TABS: 10.0000 mg | ORAL_TABLET | Freq: Every day | ORAL | 1 refills | Status: DC

## 2016-09-02 MED ORDER — FUROSEMIDE 40 MG PO TABS: 40.0000 mg | ORAL_TABLET | Freq: Every day | ORAL | 1 refills | Status: DC

## 2016-09-05 ENCOUNTER — Encounter: Payer: Self-pay | Admitting: Internal Medicine

## 2016-09-05 NOTE — Patient Instructions (Signed)
Add Bystolic 5 mg daily to Benicar, Lasix, Norvasc. Call with progress report in 2 weeks.

## 2016-09-10 ENCOUNTER — Other Ambulatory Visit: Payer: 59

## 2016-09-14 ENCOUNTER — Encounter: Payer: Self-pay | Admitting: Internal Medicine

## 2016-09-14 ENCOUNTER — Ambulatory Visit: Payer: 59 | Admitting: Endocrinology

## 2016-09-14 ENCOUNTER — Ambulatory Visit (INDEPENDENT_AMBULATORY_CARE_PROVIDER_SITE_OTHER): Payer: BC Managed Care – PPO | Admitting: Internal Medicine

## 2016-09-14 VITALS — BP 120/80 | HR 74 | Temp 97.8°F | Wt 233.0 lb

## 2016-09-14 DIAGNOSIS — I10 Essential (primary) hypertension: Secondary | ICD-10-CM | POA: Diagnosis not present

## 2016-09-14 MED ORDER — OLMESARTAN MEDOXOMIL 40 MG PO TABS: 40.0000 mg | ORAL_TABLET | Freq: Every day | ORAL | 1 refills | Status: DC

## 2016-09-14 NOTE — Progress Notes (Deleted)
   Subjective:    Patient ID: Kyle Dawson, male    DOB: 1966-03-15, 51 y.o.   MRN: [id number]  HPI    Review of Systems     Objective:   Physical Exam        Assessment & Plan:

## 2016-09-14 NOTE — Patient Instructions (Signed)
Referral at patient request to nephrology for evaluation.

## 2016-09-14 NOTE — Progress Notes (Signed)
   Subjective:    Patient ID: Kyle Dawson, male    DOB: 04-Feb-1966, 51 y.o.   MRN: [id number]  HPI  51 year old Male for follow up of HTN. He is now on Lasix, Benicar, amlodipine, Bystolic. His blood pressure is good today here in the office but he says has been as high as 945 systolically at work.  He is a bit frustrated with his blood pressure control since his been fluctuating.  He would like to see nephrologist about blood pressure to see if he has renal artery stenosis. He would also like consultation regarding management of his hypertension.  He's not sure he likes being on Benicar. It may be causing a bit of fatigue.  He seems a little tremulous today. I asked him about it and he says he thinks is new since he started several different blood pressure medications. He doesn't know why it is happening. He's had no syncopal episodes since his last visit.  Mother had surgery for carotid artery disease recently.    Review of Systems see above     Objective:   Physical Exam  Chest clear . Cor: RRR, Extremities trace nonpitting edema      Assessment & Plan:  Hypertension-Currently L4 medications  Plan: We will refer him to Nephrology at his request for evaluation.

## 2016-10-18 ENCOUNTER — Other Ambulatory Visit (INDEPENDENT_AMBULATORY_CARE_PROVIDER_SITE_OTHER): Payer: BC Managed Care – PPO

## 2016-10-18 DIAGNOSIS — E1165 Type 2 diabetes mellitus with hyperglycemia: Secondary | ICD-10-CM

## 2016-10-18 LAB — BASIC METABOLIC PANEL
BUN: 13 mg/dL (ref 6–23)
CALCIUM: 9.5 mg/dL (ref 8.4–10.5)
CO2: 28 mEq/L (ref 19–32)
CREATININE: 0.85 mg/dL (ref 0.40–1.50)
Chloride: 105 mEq/L (ref 96–112)
GFR: 100.12 mL/min (ref >60.00)
GLUCOSE: 138 mg/dL — AB (ref 70–99)
Potassium: 4.1 mEq/L (ref 3.5–5.1)
Sodium: 139 mEq/L (ref 135–145)

## 2016-10-18 LAB — HEMOGLOBIN A1C: Hgb A1c MFr Bld: 6.8 % — ABNORMAL HIGH (ref 4.6–6.5)

## 2016-10-18 MED FILL — JARDIANCE 10 MG TABLET: 10 | 30 days supply | Qty: 30 | Fill #1

## 2016-10-18 MED FILL — METFORMIN HCL ER 500 MG TAB: 500 | 30 days supply | Qty: 120 | Fill #1

## 2016-10-18 MED FILL — SILDENAFIL 20 MG TABLET: 20 | 30 days supply | Qty: 30 | Fill #1

## 2016-10-18 MED FILL — ATORVASTATIN 80 MG TABLET: 80 | 90 days supply | Qty: 90 | Fill #1

## 2016-10-26 ENCOUNTER — Ambulatory Visit (INDEPENDENT_AMBULATORY_CARE_PROVIDER_SITE_OTHER): Payer: BC Managed Care – PPO | Admitting: Endocrinology

## 2016-10-26 ENCOUNTER — Encounter: Payer: Self-pay | Admitting: Endocrinology

## 2016-10-26 VITALS — BP 138/70 | HR 102 | Ht 69.0 in | Wt 228.2 lb

## 2016-10-26 DIAGNOSIS — E1165 Type 2 diabetes mellitus with hyperglycemia: Secondary | ICD-10-CM

## 2016-10-26 NOTE — Progress Notes (Signed)
Patient ID: Kyle Dawson, male   DOB: July 31, 1965, 51 y.o.   MRN: [id number]            Reason for Appointment: Follow-up for Type 2 Diabetes  Referring physician: [place]   History of Present Illness:          Date of diagnosis of type 2 diabetes mellitus: ?  2015        Background history:   Although he thinks his blood sugars have been borderline in the past his labs include glucose readings of 183 and 315, 2 years ago He thinks he had a normal A1c in 2016 but no records are available this A1c was 6.7 in 2015.   When first seen the patient was not on any pharmacological therapy for his poorly controlled diabetes and A1c of 12%  Recent history:        His A1c is higher than usual at 6.8, has been as low as 6.3  Non-insulin hypoglycemic drugs the patient is taking are: Jardiance 10 mg daily, metformin ER 1500 mg daily  Current management, blood sugar patterns and problems identified:  His blood sugars are overall looking higher without any weight gain  Although he thinks that he is not planning his meals as well as before while at work he is still trying to eat healthy  Overall cutting back on carbohydrates and high fat food usually  He does have mild increase in fasting readings overall and lab glucose was 138  Postprandials are around 180  He says he has been trying to be consistent with exercise like walking  No side effects from Jardiance        Compliance with the medical regimen: Improving  Glucose monitoring:  done about 1 times a day         Glucometer:  One Touch Verio By recall home readings: F <120 180s pc  Self-care: The diet that the patient has been following is: tries to limit high-fat foods and fast food .     Meal times are:  Breakfast is at 11 AM  Typical meal intake: Breakfast is oatmeal               Dietician visit, most recent: None               Exercise:  indoor exercises/walking 30 minutes, 6 times a week  Weight history: max  265  Wt Readings from Last 3 Encounters:  10/26/16 228 lb 3.2 oz (103.5 kg)  09/14/16 233 lb 0.3 oz (105.7 kg)  08/27/16 228 lb (103.4 kg)    Glycemic control:   Lab Results  Component Value Date   HGBA1C 6.8 (H) 10/18/2016   HGBA1C 6.4 05/14/2016   HGBA1C 6.3 (H) 04/28/2016   Lab Results  Component Value Date   MICROALBUR 5.6 01/20/2016   LDLCALC 32 05/14/2016   CREATININE 0.85 10/18/2016   Lab Results  Component Value Date   MICRALBCREAT 43 (H) 01/20/2016    No visits with results within 1 Week(s) from this visit.  Latest known visit with results is:  Lab on 10/18/2016  Component Date Value Ref Range Status  . Hgb A1c MFr Bld 10/18/2016 6.8* 4.6 - 6.5 % Final   Glycemic Control Guidelines for People with Diabetes:Non Diabetic:  <6%Goal of Therapy: <7%Additional Action Suggested:  >8%   . Sodium 10/18/2016 139  135 - 145 mEq/L Final  . Potassium 10/18/2016 4.1  3.5 - 5.1 mEq/L Final  . Chloride 10/18/2016  105  96 - 112 mEq/L Final  . CO2 10/18/2016 28  19 - 32 mEq/L Final  . Glucose, Bld 10/18/2016 138* 70 - 99 mg/dL Final  . BUN 10/18/2016 13  6 - 23 mg/dL Final  . Creatinine, Ser 10/18/2016 0.85  0.40 - 1.50 mg/dL Final  . Calcium 10/18/2016 9.5  8.4 - 10.5 mg/dL Final  . GFR 10/18/2016 100.12  >60.00 mL/min Final    OTHER active problems: See review of systems   Allergies as of 10/26/2016      Reactions   Oxycodone    Hyper, sensitive to this      Medication List       Accurate as of 10/26/16  2:20 PM. Always use your most recent med list.          amLODipine 5 MG tablet Commonly known as:  NORVASC Take 5 mg by mouth daily.   aspirin 325 MG EC tablet Take 325 mg by mouth daily.   atorvastatin 80 MG tablet Commonly known as:  LIPITOR Take 1 tablet (80 mg total) by mouth daily.   B-50 Complex/Vitamin C Tabs Take 2 tablets by mouth 2 (two) times daily.   CALCIUM 1200 PO Take by mouth daily.   cholecalciferol 1000 units tablet Commonly  known as:  VITAMIN D Take 1,000 Units by mouth daily.   empagliflozin 10 MG Tabs tablet Commonly known as:  JARDIANCE Take 10 mg by mouth daily with breakfast.   fish oil-omega-3 fatty acids 1000 MG capsule Take 2 g by mouth daily.   furosemide 40 MG tablet Commonly known as:  LASIX Take 1 tablet (40 mg total) by mouth daily.   glucose blood test strip Commonly known as:  ONETOUCH VERIO Use as instructed to check blood sugar once a day dx code E11.9   ibuprofen 800 MG tablet Commonly known as:  ADVIL,MOTRIN Take 1 tablet (800 mg total) by mouth 3 (three) times daily.   metFORMIN 500 MG 24 hr tablet Commonly known as:  GLUCOPHAGE-XR Take 4 tablets (2,000 mg total) by mouth daily with supper.   nebivolol 5 MG tablet Commonly known as:  BYSTOLIC Take 1 tablet (5 mg total) by mouth daily.   NON FORMULARY Tumeric   olmesartan 40 MG tablet Commonly known as:  BENICAR Take 1 tablet (40 mg total) by mouth daily.   ONETOUCH DELICA LANCETS 63J Misc Use to check blood sugar once a day dx code E11.9   Resveratrol 50 MG Caps Take by mouth.   Saw Palmetto (Serenoa repens) 450 MG Caps Take by mouth daily.   sildenafil 20 MG tablet Commonly known as:  REVATIO As directed   VITAMIN A PO Take 500 Units by mouth daily.   vitamin E 400 UNIT capsule Take 400 Units by mouth daily.   Zinc 50 MG Tabs Take 2 tablets by mouth 2 (two) times daily.       Allergies:  Allergies  Allergen Reactions  . Oxycodone     Hyper, sensitive to this    Past Medical History:  Diagnosis Date  . Basal cell carcinoma   . Chest pain   . Edema    lower extremity  . Hypertension   . Squamous cell carcinoma    R ear, nose, each side of face    Past Surgical History:  Procedure Laterality Date  . Removal basal cell carcinoma    . Removal squamous cell carcinoma      Family History  Problem Relation Age  of Onset  . Peripheral vascular disease Mother 51  . Hypertension Mother    . Heart failure Mother   . CAD Maternal Grandfather   . Heart failure Paternal Grandfather   . Hypertension Father     Social History:  reports that he has never smoked. He has never used smokeless tobacco. He reports that he drinks alcohol. He reports that he does not use drugs.   Review of Systems   Lipid history: on Lipitor Rx and this was apparently started because he was found to have some carotid artery atherosclerosis    Lab Results  Component Value Date   CHOL 124 05/14/2016   HDL 59.30 05/14/2016   LDLCALC 32 05/14/2016   TRIG 164.0 (H) 05/14/2016   CHOLHDL 2 05/14/2016           Has had mild increase in liver functions even prior to getting on Lipitor  Lab Results  Component Value Date   ALT 58 (H) 05/14/2016    Hypertension:Since 1996, On amlodipine 5 and Lotensin  Blood pressure checked regularly at home with recent reading 130/70-80s  BP Readings from Last 3 Encounters:  10/26/16 138/70  09/14/16 120/80  08/27/16 (!) 160/100    Most recent eye exam was 4/17  Most recent foot exam: 10/17  ED present, Treatment has been prescribed   LABS:  No visits with results within 1 Week(s) from this visit.  Latest known visit with results is:  Lab on 10/18/2016  Component Date Value Ref Range Status  . Hgb A1c MFr Bld 10/18/2016 6.8* 4.6 - 6.5 % Final   Glycemic Control Guidelines for People with Diabetes:Non Diabetic:  <6%Goal of Therapy: <7%Additional Action Suggested:  >8%   . Sodium 10/18/2016 139  135 - 145 mEq/L Final  . Potassium 10/18/2016 4.1  3.5 - 5.1 mEq/L Final  . Chloride 10/18/2016 105  96 - 112 mEq/L Final  . CO2 10/18/2016 28  19 - 32 mEq/L Final  . Glucose, Bld 10/18/2016 138* 70 - 99 mg/dL Final  . BUN 10/18/2016 13  6 - 23 mg/dL Final  . Creatinine, Ser 10/18/2016 0.85  0.40 - 1.50 mg/dL Final  . Calcium 10/18/2016 9.5  8.4 - 10.5 mg/dL Final  . GFR 10/18/2016 100.12  >60.00 mL/min Final    Physical Examination:  BP 138/70    Pulse (!) 102   Ht 5' 9"  (1.753 m)   Wt 228 lb 3.2 oz (103.5 kg)   SpO2 97%   BMI 33.70 kg/m         ASSESSMENT:  Diabetes type 2, BMI 34  Currently on regimen of metformin maximum dose and Jardiance 10 mg  See history of present illness for detailed discussion of current diabetes management, blood sugar patterns and problems identified  His blood sugars are trending to be higher since his last visit when A1c was below 6.5 He did not bring his monitor but has mildly increased readings both fasting and postprandial He is generally doing fairly well with his diet and exercise regimen and weight is down 2 pounds  HYPERTENSION: Better controlled  With his blood pressure now being well controlled without any change in renal function or potassium unlikely that he has a secondary causes  Abnormal liver functions:?  Fatty liver, needs follow-up   PLAN:     He will increase the Jardiance to improve his control  He can start with 20 MG daily and prescription will be with 25 mg  He will bring  his monitor for download on the next visit but continued to check some readings before and after meals With increasing the Jardiance he can cut down his Lasix to half a tablet, may also need to further adjust blood pressure medication He will call if he has any consistently high readings   Patient Instructions  2 Jardiance and 1/2 Lasix  Check blood sugars on waking up    Also check blood sugars about 2 hours after a meal and do this after different meals by rotation  Recommended blood sugar levels on waking up is 90-130 and about 2 hours after meal is 130-160  Please bring your blood sugar monitor to each visit, thank you      [facility] 10/26/2016, 2:20 PM   Note: This office note was prepared with Dragon voice recognition system technology. Any transcriptional errors that result from this process are unintentional.

## 2016-10-26 NOTE — Patient Instructions (Addendum)
2 Jardiance and 1/2 Lasix  Check blood sugars on waking up    Also check blood sugars about 2 hours after a meal and do this after different meals by rotation  Recommended blood sugar levels on waking up is 90-130 and about 2 hours after meal is 130-160  Please bring your blood sugar monitor to each visit, thank you

## 2016-11-25 MED FILL — JARDIANCE 10 MG TABLET: 10 | 30 days supply | Qty: 30 | Fill #2

## 2016-11-25 MED FILL — METFORMIN HCL ER 500 MG TAB: 500 | 30 days supply | Qty: 120 | Fill #2

## 2016-11-26 ENCOUNTER — Encounter: Payer: Self-pay | Admitting: Endocrinology

## 2016-11-26 ENCOUNTER — Other Ambulatory Visit: Payer: Self-pay | Admitting: Internal Medicine

## 2016-11-30 MED ORDER — NEBIVOLOL HCL 5 MG PO TABS: 5.0000 mg | ORAL_TABLET | Freq: Every day | ORAL | 0 refills | Status: DC

## 2016-11-30 MED FILL — BYSTOLIC 5 MG TABLET: 5 | 90 days supply | Qty: 90 | Fill #0

## 2016-11-30 NOTE — Telephone Encounter (Signed)
Pt is doing fine and adjusting to medication

## 2016-12-01 ENCOUNTER — Telehealth: Payer: Self-pay | Admitting: Endocrinology

## 2016-12-01 ENCOUNTER — Encounter: Payer: Self-pay | Admitting: Endocrinology

## 2016-12-01 MED FILL — AMLODIPINE BESYLATE 10 MG T: 10 | 90 days supply | Qty: 90 | Fill #0

## 2016-12-01 MED FILL — FUROSEMIDE 40 MG TAB: 40 | 90 days supply | Qty: 90 | Fill #0

## 2016-12-01 NOTE — Telephone Encounter (Signed)
**  Remind patient they can make refill requests via MyChart**  Medication refill request (Name & Dosage):  sildenafil (REVATIO) 20 MG tablet  Preferred pharmacy (Name & Address): [facility], [address] [place] [phone] (Phone) [phone] (Fax)       Other comments (if applicable):  Patient stated he has sent this request via [place] chart as well. Please call patient when filled. OK to leave message.

## 2016-12-02 ENCOUNTER — Other Ambulatory Visit: Payer: Self-pay

## 2016-12-02 MED ORDER — SILDENAFIL CITRATE 20 MG PO TABS: ORAL_TABLET | ORAL | 1 refills | Status: DC

## 2016-12-02 NOTE — Telephone Encounter (Signed)
Patient called in to check on RX this morning. Informed patient I sent in RX and noted for him to be called when filled.

## 2016-12-02 NOTE — Telephone Encounter (Signed)
Can you print out this prescription also? Thank you!

## 2016-12-03 ENCOUNTER — Other Ambulatory Visit: Payer: Self-pay

## 2016-12-03 MED ORDER — SILDENAFIL CITRATE 20 MG PO TABS: ORAL_TABLET | ORAL | 1 refills | Status: DC

## 2016-12-03 NOTE — Telephone Encounter (Signed)
I printed this. I will get it and give it MD to sign if he has not already.

## 2016-12-07 MED FILL — SILDENAFIL 20 MG TABLET: 20 | 30 days supply | Qty: 30 | Fill #0

## 2016-12-20 ENCOUNTER — Encounter: Payer: Self-pay | Admitting: Endocrinology

## 2016-12-20 ENCOUNTER — Other Ambulatory Visit: Payer: Self-pay

## 2016-12-20 MED ORDER — SILDENAFIL CITRATE 20 MG PO TABS: ORAL_TABLET | ORAL | 1 refills | Status: DC

## 2016-12-20 MED FILL — JARDIANCE 10 MG TABLET: 10 | 30 days supply | Qty: 30 | Fill #3

## 2016-12-21 MED FILL — SILDENAFIL 20 MG TABLET: 20 | 30 days supply | Qty: 30 | Fill #0

## 2017-01-13 MED FILL — SILDENAFIL 20 MG TABLET: 20 | 30 days supply | Qty: 30 | Fill #1

## 2017-01-17 ENCOUNTER — Telehealth: Payer: Self-pay | Admitting: Endocrinology

## 2017-01-17 NOTE — Telephone Encounter (Signed)
Please advise the correct dosage of Jardiance for this patient.

## 2017-01-17 NOTE — Telephone Encounter (Signed)
Patient needs to be "doubled down" on the empagliflozin (JARDIANCE) 10 MG TABS tablet however the rx does not reflect.  [place], [address] [place] [phone] (Phone) [phone] (Fax)   Please remove the [place], patient no longer uses. Call to advise if needed.

## 2017-01-18 ENCOUNTER — Other Ambulatory Visit: Payer: Self-pay

## 2017-01-18 MED ORDER — EMPAGLIFLOZIN 25 MG PO TABS: 25.0000 mg | ORAL_TABLET | Freq: Every day | ORAL | 3 refills | Status: DC

## 2017-01-18 MED FILL — JARDIANCE 25 MG TABLET: 25 | 30 days supply | Qty: 30 | Fill #0

## 2017-01-18 NOTE — Telephone Encounter (Signed)
Send 25 mg daily as on my last note

## 2017-01-18 NOTE — Telephone Encounter (Signed)
Called patient and let him know that I have sent over a prescription for the 25 mg Jardiance one daily to [facility] for him.

## 2017-01-21 ENCOUNTER — Other Ambulatory Visit: Payer: BC Managed Care – PPO

## 2017-01-26 ENCOUNTER — Ambulatory Visit: Payer: BC Managed Care – PPO | Admitting: Endocrinology

## 2017-02-07 MED FILL — SILDENAFIL 20 MG TABLET: 20 | 30 days supply | Qty: 30 | Fill #1

## 2017-02-17 MED FILL — JARDIANCE 25 MG TABLET: 25 | 30 days supply | Qty: 30 | Fill #1

## 2017-02-17 MED FILL — OLMESARTAN MEDOXOMIL 40 MG: 40 | 30 days supply | Qty: 30 | Fill #0

## 2017-02-23 ENCOUNTER — Other Ambulatory Visit (INDEPENDENT_AMBULATORY_CARE_PROVIDER_SITE_OTHER): Payer: BC Managed Care – PPO

## 2017-02-23 DIAGNOSIS — E1165 Type 2 diabetes mellitus with hyperglycemia: Secondary | ICD-10-CM | POA: Diagnosis not present

## 2017-02-23 LAB — MICROALBUMIN / CREATININE URINE RATIO
Creatinine,U: 84.9 mg/dL
Microalb Creat Ratio: 1.2 mg/g (ref 0.0–30.0)
Microalb, Ur: 1 mg/dL (ref 0.0–1.9)

## 2017-02-23 LAB — COMPREHENSIVE METABOLIC PANEL WITH GFR
ALT: 49 U/L (ref 0–53)
AST: 32 U/L (ref 0–37)
Albumin: 4.2 g/dL (ref 3.5–5.2)
Alkaline Phosphatase: 39 U/L (ref 39–117)
BUN: 12 mg/dL (ref 6–23)
CO2: 26 meq/L (ref 19–32)
Calcium: 9.1 mg/dL (ref 8.4–10.5)
Chloride: 105 meq/L (ref 96–112)
Creatinine, Ser: 0.86 mg/dL (ref 0.40–1.50)
GFR: 98.65 mL/min
Glucose, Bld: 140 mg/dL — ABNORMAL HIGH (ref 70–99)
Potassium: 3.9 meq/L (ref 3.5–5.1)
Sodium: 143 meq/L (ref 135–145)
Total Bilirubin: 0.5 mg/dL (ref 0.2–1.2)
Total Protein: 6.8 g/dL (ref 6.0–8.3)

## 2017-02-23 LAB — HEMOGLOBIN A1C: HEMOGLOBIN A1C: 6.8 % — AB (ref 4.6–6.5)

## 2017-02-27 NOTE — Progress Notes (Signed)
Patient ID: Kyle Dawson, male   DOB: 06/19/1965, 51 y.o.   MRN: [id number]            Reason for Appointment: Follow-up for Type 2 Diabetes  Referring physician: [place]   History of Present Illness:          Date of diagnosis of type 2 diabetes mellitus: ?  2015        Background history:   Although he thinks his blood sugars have been borderline in the past his labs include glucose readings of 183 and 315, 2 years ago He thinks he had a normal A1c in 2016 but no records are available this A1c was 6.7 in 2015.   When first seen the patient was not on any pharmacological therapy for his poorly controlled diabetes and A1c of 12%  Recent history:        His A1c is again higher than usual at 6.8, has been as low as 6.3  Non-insulin hypoglycemic drugs the patient is taking are: Jardiance 25 mg daily, metformin ER 1500 mg daily  Current management, blood sugar patterns and problems identified:  His blood sugar control has not improved and he is having difficulty losing weight  This is despite increasing the Jardiance to 25 mg  He feels that because he cannot follow his preferred regimen of 5 small meals a day he is having tall losing weight  However he thinks that adding more vegetables and greens to his diet he does not have as many high sugars after meals around 180 but did not bring his meter or check very much  He is still trying to be fairly active but not doing formal aerobic exercise as much  No side effects from Jardiance        Compliance with the medical regimen: Improving  Glucose monitoring:  done less than 1 times a day         Glucometer:  One Touch Verio By recall home readings:  Fasting about 90-110 180s pc  Self-care: The diet that the patient has been following is: tries to limit high-fat foods and fast food .     Meal times are:  Breakfast is at 11 AM  Typical meal intake: Breakfast is oatmeal               Dietician visit, most recent: None           Exercise:  indoor exercises/walking 30 minutes, 4 times a week  Weight history:  Previous maximum 265  Wt Readings from Last 3 Encounters:  02/28/17 232 lb 12.8 oz (105.6 kg)  10/26/16 228 lb 3.2 oz (103.5 kg)  09/14/16 233 lb 0.3 oz (105.7 kg)    Glycemic control:   Lab Results  Component Value Date   HGBA1C 6.8 (H) 02/23/2017   HGBA1C 6.8 (H) 10/18/2016   HGBA1C 6.4 05/14/2016   Lab Results  Component Value Date   MICROALBUR 1.0 02/23/2017   LDLCALC 32 05/14/2016   CREATININE 0.86 02/23/2017   Lab Results  Component Value Date   MICRALBCREAT 1.2 02/23/2017    Lab on 02/23/2017  Component Date Value Ref Range Status  . Hgb A1c MFr Bld 02/23/2017 6.8* 4.6 - 6.5 % Final   Glycemic Control Guidelines for People with Diabetes:Non Diabetic:  <6%Goal of Therapy: <7%Additional Action Suggested:  >8%   . Sodium 02/23/2017 143  135 - 145 mEq/L Final  . Potassium 02/23/2017 3.9  3.5 - 5.1 mEq/L Final  .  Chloride 02/23/2017 105  96 - 112 mEq/L Final  . CO2 02/23/2017 26  19 - 32 mEq/L Final  . Glucose, Bld 02/23/2017 140* 70 - 99 mg/dL Final  . BUN 02/23/2017 12  6 - 23 mg/dL Final  . Creatinine, Ser 02/23/2017 0.86  0.40 - 1.50 mg/dL Final  . Total Bilirubin 02/23/2017 0.5  0.2 - 1.2 mg/dL Final  . Alkaline Phosphatase 02/23/2017 39  39 - 117 U/L Final  . AST 02/23/2017 32  0 - 37 U/L Final  . ALT 02/23/2017 49  0 - 53 U/L Final  . Total Protein 02/23/2017 6.8  6.0 - 8.3 g/dL Final  . Albumin 02/23/2017 4.2  3.5 - 5.2 g/dL Final  . Calcium 02/23/2017 9.1  8.4 - 10.5 mg/dL Final  . GFR 02/23/2017 98.65  >60.00 mL/min Final  . Microalb, Ur 02/23/2017 1.0  0.0 - 1.9 mg/dL Final  . Creatinine,U 02/23/2017 84.9  mg/dL Final  . Microalb Creat Ratio 02/23/2017 1.2  0.0 - 30.0 mg/g Final    OTHER active problems: See review of systems   Allergies as of 02/28/2017      Reactions   Oxycodone    Hyper, sensitive to this      Medication List       Accurate as of  02/28/17  8:36 AM. Always use your most recent med list.          amLODipine 5 MG tablet Commonly known as:  NORVASC Take 5 mg by mouth daily.   aspirin 325 MG EC tablet Take 325 mg by mouth daily.   atorvastatin 80 MG tablet Commonly known as:  LIPITOR Take 1 tablet (80 mg total) by mouth daily.   B-50 Complex/Vitamin C Tabs Take 2 tablets by mouth 2 (two) times daily.   CALCIUM 1200 PO Take by mouth daily.   cholecalciferol 1000 units tablet Commonly known as:  VITAMIN D Take 1,000 Units by mouth daily.   empagliflozin 25 MG Tabs tablet Commonly known as:  JARDIANCE Take 25 mg by mouth daily.   fish oil-omega-3 fatty acids 1000 MG capsule Take 2 g by mouth daily.   furosemide 40 MG tablet Commonly known as:  LASIX Take 1 tablet (40 mg total) by mouth daily.   glucose blood test strip Commonly known as:  ONETOUCH VERIO Use as instructed to check blood sugar once a day dx code E11.9   ibuprofen 800 MG tablet Commonly known as:  ADVIL,MOTRIN Take 1 tablet (800 mg total) by mouth 3 (three) times daily.   metFORMIN 500 MG 24 hr tablet Commonly known as:  GLUCOPHAGE-XR Take 4 tablets (2,000 mg total) by mouth daily with supper.   nebivolol 5 MG tablet Commonly known as:  BYSTOLIC Take 1 tablet (5 mg total) by mouth daily.   NON FORMULARY Tumeric   olmesartan 40 MG tablet Commonly known as:  BENICAR Take 1 tablet (40 mg total) by mouth daily.   ONETOUCH DELICA LANCETS 98X Misc Use to check blood sugar once a day dx code E11.9   Resveratrol 50 MG Caps Take by mouth.   Saw Palmetto (Serenoa repens) 450 MG Caps Take by mouth daily.   sildenafil 20 MG tablet Commonly known as:  REVATIO As directed   sitaGLIPtin 100 MG tablet Commonly known as:  JANUVIA Take 1 tablet (100 mg total) by mouth daily.   VITAMIN A PO Take 500 Units by mouth daily.   vitamin E 400 UNIT capsule Take 400 Units by  mouth daily.   Zinc 50 MG Tabs Take 2 tablets by mouth  2 (two) times daily.       Allergies:  Allergies  Allergen Reactions  . Oxycodone     Hyper, sensitive to this    Past Medical History:  Diagnosis Date  . Basal cell carcinoma   . Chest pain   . Edema    lower extremity  . Hypertension   . Squamous cell carcinoma    R ear, nose, each side of face    Past Surgical History:  Procedure Laterality Date  . Removal basal cell carcinoma    . Removal squamous cell carcinoma      Family History  Problem Relation Age of Onset  . Peripheral vascular disease Mother 52  . Hypertension Mother   . Heart failure Mother   . CAD Maternal Grandfather   . Heart failure Paternal Grandfather   . Hypertension Father     Social History:  reports that he has never smoked. He has never used smokeless tobacco. He reports that he drinks alcohol. He reports that he does not use drugs.   Review of Systems   Lipid history: on Lipitor 80 mg from his other physicians    Lab Results  Component Value Date   CHOL 124 05/14/2016   HDL 59.30 05/14/2016   LDLCALC 32 05/14/2016   TRIG 164.0 (H) 05/14/2016   CHOLHDL 2 05/14/2016           Has had mild increase in liver functions even prior to getting on Lipitor, Now improved  Lab Results  Component Value Date   ALT 49 02/23/2017    Hypertension:Since 1996, On amlodipine 5 mg, Bystolic 5 mg and Benicar 40 mg Followed by PCP  Blood pressure checked regularly at home and usually is not as high as today  BP Readings from Last 3 Encounters:  02/28/17 (!) 144/90  10/26/16 138/70  09/14/16 120/80    Most recent eye exam was 4/17  Most recent foot exam: 10/18  Erectile dysfunction: He is getting benefit from sildenafil, usually using 60 mg at a time  LABS:  Lab on 02/23/2017  Component Date Value Ref Range Status  . Hgb A1c MFr Bld 02/23/2017 6.8* 4.6 - 6.5 % Final   Glycemic Control Guidelines for People with Diabetes:Non Diabetic:  <6%Goal of Therapy: <7%Additional Action  Suggested:  >8%   . Sodium 02/23/2017 143  135 - 145 mEq/L Final  . Potassium 02/23/2017 3.9  3.5 - 5.1 mEq/L Final  . Chloride 02/23/2017 105  96 - 112 mEq/L Final  . CO2 02/23/2017 26  19 - 32 mEq/L Final  . Glucose, Bld 02/23/2017 140* 70 - 99 mg/dL Final  . BUN 02/23/2017 12  6 - 23 mg/dL Final  . Creatinine, Ser 02/23/2017 0.86  0.40 - 1.50 mg/dL Final  . Total Bilirubin 02/23/2017 0.5  0.2 - 1.2 mg/dL Final  . Alkaline Phosphatase 02/23/2017 39  39 - 117 U/L Final  . AST 02/23/2017 32  0 - 37 U/L Final  . ALT 02/23/2017 49  0 - 53 U/L Final  . Total Protein 02/23/2017 6.8  6.0 - 8.3 g/dL Final  . Albumin 02/23/2017 4.2  3.5 - 5.2 g/dL Final  . Calcium 02/23/2017 9.1  8.4 - 10.5 mg/dL Final  . GFR 02/23/2017 98.65  >60.00 mL/min Final  . Microalb, Ur 02/23/2017 1.0  0.0 - 1.9 mg/dL Final  . Creatinine,U 02/23/2017 84.9  mg/dL Final  .  Microalb Creat Ratio 02/23/2017 1.2  0.0 - 30.0 mg/g Final    Physical Examination:  BP (!) 144/90   Pulse 75   Ht 5' 9"  (1.753 m)   Wt 232 lb 12.8 oz (105.6 kg)   SpO2 96%   BMI 34.38 kg/m      Diabetic Foot Exam - Simple   Simple Foot Form Diabetic Foot exam was performed with the following findings:  Yes 02/28/2017  8:18 AM  Visual Inspection No deformities, no ulcerations, no other skin breakdown bilaterally:  Yes Sensation Testing Intact to touch and monofilament testing bilaterally:  Yes Pulse Check Posterior Tibialis and Dorsalis pulse intact bilaterally:  Yes Comments        ASSESSMENT:  Diabetes type 2, BMI 34  Currently on regimen of metformin maximum dose and Jardiance 25 mg  See history of present illness for detailed discussion of current diabetes management, blood sugar patterns and problems identified  His A1c is 6.8 He does seem to be having high postprandial reading Also having difficulty losing weight anymore and this is despite increasing Jardiance He is a good candidate for GLP-1 drugs but he is not  willing to consider this at this time His blood sugars are trending to be higher since his last visit when A1c was below 6.5 He did not bring his monitor but has mildly increased readings both fasting and postprandial He is generally doing fairly well with his diet and exercise regimen and weight is down 2 pounds  HYPERTENSION: Better controlled but higher today possibly from anxiety  Erectile dysfunction: He will be given a new prescription with 60 tablets of sildenafil  PLAN:     He agrees to start Januvia for better postprandial blood sugar control; discussed how this works and effects on postprandial blood sugars, may also densely combined this with metformin if effective He does need to check more often after meals He will try to continue improving his diet Follow-up in 3 months and if he is having difficulty losing weight may consider adding Ozempic Continue follow-up with PCP for hypertension   Patient Instructions  Check blood sugars on waking up  weekly  Also check blood sugars about 2 hours after a meal and do this after different meals by rotation  Recommended blood sugar levels on waking up is 90-130 and about 2 hours after meal is 130-160  Please bring your blood sugar monitor to each visit, thank you  More testing      [facility] 02/28/2017, 8:36 AM   Note: This office note was prepared with Dragon voice recognition system technology. Any transcriptional errors that result from this process are unintentional.

## 2017-02-28 ENCOUNTER — Encounter: Payer: Self-pay | Admitting: Endocrinology

## 2017-02-28 ENCOUNTER — Ambulatory Visit (INDEPENDENT_AMBULATORY_CARE_PROVIDER_SITE_OTHER): Payer: BC Managed Care – PPO | Admitting: Endocrinology

## 2017-02-28 VITALS — BP 144/90 | HR 75 | Ht 69.0 in | Wt 232.8 lb

## 2017-02-28 DIAGNOSIS — E1165 Type 2 diabetes mellitus with hyperglycemia: Secondary | ICD-10-CM

## 2017-02-28 MED ORDER — SILDENAFIL CITRATE 20 MG PO TABS: ORAL_TABLET | ORAL | 1 refills | Status: DC

## 2017-02-28 MED ORDER — SITAGLIPTIN PHOSPHATE 100 MG PO TABS: 100.0000 mg | ORAL_TABLET | Freq: Every day | ORAL | 3 refills | Status: DC

## 2017-02-28 NOTE — Patient Instructions (Signed)
Check blood sugars on waking up  weekly  Also check blood sugars about 2 hours after a meal and do this after different meals by rotation  Recommended blood sugar levels on waking up is 90-130 and about 2 hours after meal is 130-160  Please bring your blood sugar monitor to each visit, thank you  More testing

## 2017-03-03 MED FILL — SILDENAFIL 20 MG TABLET: 20 | 14 days supply | Qty: 28 | Fill #0

## 2017-03-03 MED FILL — JANUVIA 100 MG TABLET: 100 | 30 days supply | Qty: 30 | Fill #0

## 2017-03-08 ENCOUNTER — Ambulatory Visit (INDEPENDENT_AMBULATORY_CARE_PROVIDER_SITE_OTHER): Payer: BC Managed Care – PPO | Admitting: Internal Medicine

## 2017-03-08 ENCOUNTER — Encounter: Payer: Self-pay | Admitting: Internal Medicine

## 2017-03-08 VITALS — BP 148/92 | HR 86 | Temp 98.7°F | Wt 228.0 lb

## 2017-03-08 DIAGNOSIS — I10 Essential (primary) hypertension: Secondary | ICD-10-CM | POA: Diagnosis not present

## 2017-03-08 NOTE — Progress Notes (Signed)
   Subjective:    Patient ID: Kyle Dawson, male    DOB: 06-15-1965, 51 y.o.   MRN: [id number]  HPI He was last here in May.  He has a history of hypertension and is on Lasix, Benicar, amlodipine and by systolic.  At that time his blood pressure was excellent at 120/80.  He noted that his blood pressure had been fluctuating some.  We talked about nephrology consultation and evaluating renal artery stenosis.  He saw Dr. Dwyane Dee in June for uncontrolled diabetes mellitus.  Dr. Dwyane Dee felt it was unlikely he had secondary causes for hypertension since he was under good control by that time.  He was placed on Jardiance and was advised he might be able to cut down on Lasix to half a tablet daily.  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  Around 2:00 his blood pressure was 204/113 with pulse of 87.  Half hour later his blood pressure was 199/112 and 45 minutes later to 22/117.  Subsequently over the next 4 hours blood pressure came down to 172/114.  He subsequently took amlodipine 10 mg and Benicar 40 mg around 8:00.  However blood pressure still remained in the 583E  systolically in low 940H diastolically until 680 this morning.  He said he felt a bit foggy early on his shift and he drank some coffee.  He denied any particular stress.  He mentioned his elevated blood pressure to 1 of the physicians who quickly checked him out.  He is here today for follow-up.  Blood pressure today is 148/92.  His weight is 228 pounds.  He does have some issues with urinary frequency when he takes 40 mg of Lasix daily.  Sometimes it can be numerous times that he has to urinate during his shift which is aggravating.  It was felt that he likely had a transient ischemic attack in the emergency department in December 2017.  Subsequently he saw Dr. Jannifer Franklin, neurologist for evaluation.  He is to follow-up with him again in December.  He has not had any more TIAs  since that initial one in December 2017.    Review of Systems see above.  No complaint of headache today.    Objective:   Physical Exam Skin warm and dry.  Nodes benign.  Neck is supple.  Chest clear.  Cardiac exam regular rate and rhythm normal S1 and 2.  Trace lower extremity edema.       Assessment & Plan:  I think he likely needs to have his Lasix 40 mg daily on a regular basis.  Sometimes he only takes half a tablet.  Also have any increase his Bystolic from 8-81 mg daily.  He was given 2 weeks worth of samples.  He will continue other medications Benicar and amlodipine as previously prescribed.  If I increase his amlodipine to 10 mg daily he is likely to have edema.  He will return or at least call me in 2 weeks.

## 2017-03-08 NOTE — Patient Instructions (Signed)
Continue Benicar, Lasix 40 mg daily, amlodipine 5 mg daily and increase Bystolic to 10 mg daily.  Call with progress report in 7-10 days.

## 2017-03-28 MED FILL — JARDIANCE 25 MG TABLET: 25 | 30 days supply | Qty: 30 | Fill #2

## 2017-03-28 MED FILL — METFORMIN HCL ER 500 MG TAB: 500 | 30 days supply | Qty: 120 | Fill #3

## 2017-03-28 MED FILL — OLMESARTAN MEDOXOMIL 40 MG: 40 | 30 days supply | Qty: 30 | Fill #1

## 2017-03-28 MED FILL — SILDENAFIL 20 MG TABLET: 20 | 14 days supply | Qty: 28 | Fill #1

## 2017-04-12 MED FILL — FUROSEMIDE 40 MG TABS: 40 | 90 days supply | Qty: 90 | Fill #1

## 2017-04-12 MED FILL — AMLODIPINE BESYLATE 10 MG T: 10 | 90 days supply | Qty: 90 | Fill #1

## 2017-04-12 MED FILL — SILDENAFIL 20 MG TABLET: 20 | 14 days supply | Qty: 28 | Fill #2

## 2017-04-24 MED FILL — OLMESARTAN MEDOXOMIL 40 MG: 40 | 30 days supply | Qty: 30 | Fill #2

## 2017-04-25 MED FILL — SILDENAFIL 20 MG TABLET: 20 | 18 days supply | Qty: 36 | Fill #3

## 2017-04-25 MED FILL — JARDIANCE 25 MG TABLET: 25 | 30 days supply | Qty: 30 | Fill #3

## 2017-05-05 ENCOUNTER — Other Ambulatory Visit: Payer: BC Managed Care – PPO

## 2017-05-06 ENCOUNTER — Telehealth: Payer: Self-pay | Admitting: Neurology

## 2017-05-06 DIAGNOSIS — I6522 Occlusion and stenosis of left carotid artery: Secondary | ICD-10-CM

## 2017-05-06 NOTE — Telephone Encounter (Signed)
I called the patient.  We will repeat the carotid Doppler study, the patient had 50% stenosis on MRI angiogram done 1 year ago.

## 2017-05-11 ENCOUNTER — Ambulatory Visit: Payer: BC Managed Care – PPO | Admitting: Endocrinology

## 2017-05-11 DIAGNOSIS — Z0289 Encounter for other administrative examinations: Secondary | ICD-10-CM

## 2017-05-30 ENCOUNTER — Other Ambulatory Visit: Payer: Self-pay | Admitting: Internal Medicine

## 2017-05-31 MED ORDER — NEBIVOLOL HCL 5 MG PO TABS: 5.0000 mg | ORAL_TABLET | Freq: Every day | ORAL | 0 refills | Status: DC

## 2017-05-31 MED FILL — BYSTOLIC 5 MG TABLET: 5 | 90 days supply | Qty: 90 | Fill #0

## 2017-06-02 ENCOUNTER — Other Ambulatory Visit: Payer: Self-pay | Admitting: Endocrinology

## 2017-06-02 MED FILL — SILDENAFIL 20 MG TABLET: 20 | 18 days supply | Qty: 60 | Fill #0

## 2017-06-26 ENCOUNTER — Encounter: Payer: Self-pay | Admitting: Endocrinology

## 2017-06-26 ENCOUNTER — Encounter: Payer: Self-pay | Admitting: Internal Medicine

## 2017-06-27 MED ORDER — EMPAGLIFLOZIN 25 MG PO TABS: 25.0000 mg | ORAL_TABLET | Freq: Every day | ORAL | 3 refills | Status: DC

## 2017-06-27 MED ORDER — METFORMIN HCL ER 500 MG PO TB24: 2000.0000 mg | ORAL_TABLET | Freq: Every day | ORAL | 3 refills | Status: DC

## 2017-06-27 MED FILL — JARDIANCE 25 MG TABLET: 25 | 30 days supply | Qty: 30 | Fill #0

## 2017-06-27 MED FILL — METFORMIN HCL ER 500 MG TAB: 500 | 30 days supply | Qty: 120 | Fill #0

## 2017-06-30 ENCOUNTER — Other Ambulatory Visit: Payer: Self-pay

## 2017-06-30 MED ORDER — OLMESARTAN MEDOXOMIL 40 MG PO TABS: 40.0000 mg | ORAL_TABLET | Freq: Every day | ORAL | 0 refills | Status: DC

## 2017-06-30 MED FILL — OLMESARTAN MEDOXOMIL 20 MG: 20 | 30 days supply | Qty: 60 | Fill #0

## 2017-08-11 MED FILL — OLMESARTAN MEDOXOMIL 20 MG: 20 | 30 days supply | Qty: 60 | Fill #0

## 2017-08-11 MED FILL — JARDIANCE 25 MG TABLET: 25 | 30 days supply | Qty: 30 | Fill #1

## 2017-08-11 MED FILL — METFORMIN HCL ER 500 MG TAB: 500 | 30 days supply | Qty: 120 | Fill #1

## 2017-08-11 MED FILL — SILDENAFIL 20 MG TABLET: 20 | 18 days supply | Qty: 60 | Fill #1

## 2017-09-26 ENCOUNTER — Other Ambulatory Visit: Payer: Self-pay | Admitting: Internal Medicine

## 2017-09-26 ENCOUNTER — Other Ambulatory Visit: Payer: Self-pay | Admitting: Endocrinology

## 2017-09-26 MED FILL — OLMESARTAN MEDOXOMIL 20 MG: 20 | 30 days supply | Qty: 60 | Fill #1

## 2017-09-26 MED FILL — JANUVIA 100 MG TABLET: 100 | 30 days supply | Qty: 30 | Fill #1

## 2017-09-26 MED FILL — SILDENAFIL 20 MG TABLET: 20 | 18 days supply | Qty: 60 | Fill #0

## 2017-09-26 MED FILL — METFORMIN HCL ER 500 MG TAB: 500 | 30 days supply | Qty: 120 | Fill #2

## 2017-09-26 MED FILL — JARDIANCE 25 MG TABLET: 25 | 30 days supply | Qty: 30 | Fill #2

## 2017-09-26 MED FILL — BYSTOLIC 5 MG TABLET: 5 | 90 days supply | Qty: 90 | Fill #0

## 2017-09-26 MED FILL — FUROSEMIDE 40 MG TAB: 40 | 90 days supply | Qty: 90 | Fill #0

## 2017-10-27 MED FILL — OLMESARTAN MEDOXOMIL 20 MG: 20 | 30 days supply | Qty: 60 | Fill #2

## 2017-10-27 MED FILL — JARDIANCE 25 MG TABLET: 25 | 30 days supply | Qty: 30 | Fill #3

## 2017-10-27 MED FILL — METFORMIN HCL ER 500 MG TAB: 500 | 30 days supply | Qty: 120 | Fill #3

## 2017-10-27 MED FILL — SILDENAFIL 20 MG TABLET: 20 | 18 days supply | Qty: 60 | Fill #1

## 2017-11-26 ENCOUNTER — Other Ambulatory Visit: Payer: Self-pay

## 2017-11-26 ENCOUNTER — Inpatient Hospital Stay (HOSPITAL_COMMUNITY)
Admission: EM | Admit: 2017-11-26 | Discharge: 2017-11-30 | DRG: 305 | Disposition: A | Discharge status: Home / Self Care | Payer: BC Managed Care – PPO | Attending: Internal Medicine | Admitting: Internal Medicine

## 2017-11-26 ENCOUNTER — Observation Stay (HOSPITAL_COMMUNITY): Payer: BC Managed Care – PPO

## 2017-11-26 ENCOUNTER — Encounter (HOSPITAL_COMMUNITY): Payer: Self-pay | Admitting: Emergency Medicine

## 2017-11-26 ENCOUNTER — Emergency Department (HOSPITAL_COMMUNITY): Payer: BC Managed Care – PPO

## 2017-11-26 DIAGNOSIS — R0602 Shortness of breath: Secondary | ICD-10-CM | POA: Diagnosis not present

## 2017-11-26 DIAGNOSIS — Z8673 Personal history of transient ischemic attack (TIA), and cerebral infarction without residual deficits: Secondary | ICD-10-CM

## 2017-11-26 DIAGNOSIS — I161 Hypertensive emergency: Principal | ICD-10-CM | POA: Diagnosis present

## 2017-11-26 DIAGNOSIS — Z789 Other specified health status: Secondary | ICD-10-CM

## 2017-11-26 DIAGNOSIS — Z6833 Body mass index (BMI) 33.0-33.9, adult: Secondary | ICD-10-CM

## 2017-11-26 DIAGNOSIS — E669 Obesity, unspecified: Secondary | ICD-10-CM | POA: Diagnosis present

## 2017-11-26 DIAGNOSIS — F1090 Alcohol use, unspecified, uncomplicated: Secondary | ICD-10-CM

## 2017-11-26 DIAGNOSIS — R7989 Other specified abnormal findings of blood chemistry: Secondary | ICD-10-CM

## 2017-11-26 DIAGNOSIS — K7581 Nonalcoholic steatohepatitis (NASH): Secondary | ICD-10-CM | POA: Diagnosis present

## 2017-11-26 DIAGNOSIS — E785 Hyperlipidemia, unspecified: Secondary | ICD-10-CM | POA: Diagnosis present

## 2017-11-26 DIAGNOSIS — Z7984 Long term (current) use of oral hypoglycemic drugs: Secondary | ICD-10-CM

## 2017-11-26 DIAGNOSIS — Z87891 Personal history of nicotine dependence: Secondary | ICD-10-CM

## 2017-11-26 DIAGNOSIS — I119 Hypertensive heart disease without heart failure: Secondary | ICD-10-CM

## 2017-11-26 DIAGNOSIS — E6609 Other obesity due to excess calories: Secondary | ICD-10-CM

## 2017-11-26 DIAGNOSIS — Z8249 Family history of ischemic heart disease and other diseases of the circulatory system: Secondary | ICD-10-CM

## 2017-11-26 DIAGNOSIS — R609 Edema, unspecified: Secondary | ICD-10-CM | POA: Diagnosis not present

## 2017-11-26 DIAGNOSIS — E119 Type 2 diabetes mellitus without complications: Secondary | ICD-10-CM

## 2017-11-26 DIAGNOSIS — I319 Disease of pericardium, unspecified: Secondary | ICD-10-CM | POA: Diagnosis present

## 2017-11-26 DIAGNOSIS — Z7289 Other problems related to lifestyle: Secondary | ICD-10-CM

## 2017-11-26 DIAGNOSIS — R0600 Dyspnea, unspecified: Secondary | ICD-10-CM

## 2017-11-26 DIAGNOSIS — I2510 Atherosclerotic heart disease of native coronary artery without angina pectoris: Secondary | ICD-10-CM | POA: Diagnosis present

## 2017-11-26 DIAGNOSIS — R740 Nonspecific elevation of levels of transaminase and lactic acid dehydrogenase [LDH]: Secondary | ICD-10-CM

## 2017-11-26 DIAGNOSIS — I10 Essential (primary) hypertension: Secondary | ICD-10-CM

## 2017-11-26 DIAGNOSIS — Z7982 Long term (current) use of aspirin: Secondary | ICD-10-CM

## 2017-11-26 DIAGNOSIS — G451 Carotid artery syndrome (hemispheric): Secondary | ICD-10-CM | POA: Diagnosis present

## 2017-11-26 DIAGNOSIS — Z885 Allergy status to narcotic agent status: Secondary | ICD-10-CM

## 2017-11-26 DIAGNOSIS — R945 Abnormal results of liver function studies: Secondary | ICD-10-CM

## 2017-11-26 DIAGNOSIS — R7401 Elevation of levels of liver transaminase levels: Secondary | ICD-10-CM

## 2017-11-26 DIAGNOSIS — E876 Hypokalemia: Secondary | ICD-10-CM | POA: Diagnosis not present

## 2017-11-26 DIAGNOSIS — R079 Chest pain, unspecified: Secondary | ICD-10-CM

## 2017-11-26 HISTORY — DX: Type 2 diabetes mellitus without complications: E11.9

## 2017-11-26 LAB — TROPONIN I
Troponin I: <0.03 ng/mL (ref <0.03)
Troponin I: <0.03 ng/mL (ref <0.03)

## 2017-11-26 LAB — I-STAT TROPONIN, ED: Troponin i, poc: 0 ng/mL (ref 0.00–0.08)

## 2017-11-26 LAB — CBC WITH DIFFERENTIAL/PLATELET
Basophils Absolute: 0 10*3/uL (ref 0.0–0.1)
Basophils Relative: 1 %
EOS PCT: 1 %
Eosinophils Absolute: 0 10*3/uL (ref 0.0–0.7)
HEMATOCRIT: 45.8 % (ref 39.0–52.0)
Hemoglobin: 15.9 g/dL (ref 13.0–17.0)
LYMPHS PCT: 20 %
Lymphs Abs: 0.8 10*3/uL (ref 0.7–4.0)
MCH: 34.3 pg — ABNORMAL HIGH (ref 26.0–34.0)
MCHC: 34.7 g/dL (ref 30.0–36.0)
MCV: 98.9 fL (ref 78.0–100.0)
MONOS PCT: 13 %
Monocytes Absolute: 0.5 10*3/uL (ref 0.1–1.0)
NEUTROS ABS: 2.5 10*3/uL (ref 1.7–7.7)
Neutrophils Relative %: 65 %
PLATELETS: 185 10*3/uL (ref 150–400)
RBC: 4.63 MIL/uL (ref 4.22–5.81)
RDW: 13 % (ref 11.5–15.5)
WBC: 3.9 10*3/uL — ABNORMAL LOW (ref 4.0–10.5)

## 2017-11-26 LAB — COMPREHENSIVE METABOLIC PANEL
ALT: 74 U/L — ABNORMAL HIGH (ref 0–44)
ANION GAP: 12 (ref 5–15)
AST: 41 U/L (ref 15–41)
Albumin: 4 g/dL (ref 3.5–5.0)
Alkaline Phosphatase: 39 U/L (ref 38–126)
BILIRUBIN TOTAL: 0.9 mg/dL (ref 0.3–1.2)
BUN: 11 mg/dL (ref 6–20)
CO2: 25 mmol/L (ref 22–32)
Calcium: 9.2 mg/dL (ref 8.9–10.3)
Chloride: 104 mmol/L (ref 98–111)
Creatinine, Ser: 0.84 mg/dL (ref 0.61–1.24)
Glucose, Bld: 207 mg/dL — ABNORMAL HIGH (ref 70–99)
Potassium: 3.8 mmol/L (ref 3.5–5.1)
Sodium: 141 mmol/L (ref 135–145)
TOTAL PROTEIN: 7.1 g/dL (ref 6.5–8.1)

## 2017-11-26 LAB — GLUCOSE, CAPILLARY
GLUCOSE-CAPILLARY: 161 mg/dL — AB (ref 70–99)
Glucose-Capillary: 208 mg/dL — ABNORMAL HIGH (ref 70–99)

## 2017-11-26 LAB — BRAIN NATRIURETIC PEPTIDE: B NATRIURETIC PEPTIDE 5: 117.9 pg/mL — AB (ref 0.0–100.0)

## 2017-11-26 LAB — D-DIMER, QUANTITATIVE: D-Dimer, Quant: 0.49 ug/mL-FEU (ref 0.00–0.50)

## 2017-11-26 MED ORDER — ACETAMINOPHEN 325 MG PO TABS
650.0000 mg | ORAL_TABLET | ORAL | Status: DC | PRN
  Administered 2017-11-27: 650 mg via ORAL
  Filled 2017-11-26: qty 2

## 2017-11-26 MED ORDER — PANTOPRAZOLE SODIUM 40 MG PO TBEC
40.0000 mg | DELAYED_RELEASE_TABLET | Freq: Every day | ORAL | Status: DC
  Administered 2017-11-26 – 2017-11-30 (×5): 40 mg via ORAL
  Filled 2017-11-26 (×5): qty 1

## 2017-11-26 MED ORDER — HYDRALAZINE HCL 20 MG/ML IJ SOLN
5.0000 mg | Freq: Once | INTRAMUSCULAR | Status: AC
  Administered 2017-11-26: 5 mg via INTRAVENOUS
  Filled 2017-11-26: qty 1

## 2017-11-26 MED ORDER — IRBESARTAN 300 MG PO TABS
300.0000 mg | ORAL_TABLET | Freq: Every day | ORAL | Status: DC
  Administered 2017-11-26 – 2017-11-30 (×5): 300 mg via ORAL
  Filled 2017-11-26 (×5): qty 1

## 2017-11-26 MED ORDER — FUROSEMIDE 10 MG/ML IJ SOLN
40.0000 mg | Freq: Once | INTRAMUSCULAR | Status: AC
  Administered 2017-11-26: 40 mg via INTRAVENOUS
  Filled 2017-11-26: qty 4

## 2017-11-26 MED ORDER — ENOXAPARIN SODIUM 40 MG/0.4ML ~~LOC~~ SOLN
40.0000 mg | SUBCUTANEOUS | Status: DC
  Administered 2017-11-26 – 2017-11-29 (×4): 40 mg via SUBCUTANEOUS
  Filled 2017-11-26 (×4): qty 0.4

## 2017-11-26 MED ORDER — ALPRAZOLAM 0.25 MG PO TABS: 0.2500 mg | ORAL_TABLET | Freq: Two times a day (BID) | ORAL | Status: DC | PRN

## 2017-11-26 MED ORDER — GI COCKTAIL ~~LOC~~: 30.0000 mL | Freq: Four times a day (QID) | ORAL | Status: DC | PRN

## 2017-11-26 MED ORDER — NEBIVOLOL HCL 5 MG PO TABS
5.0000 mg | ORAL_TABLET | Freq: Every day | ORAL | Status: DC
  Administered 2017-11-26 – 2017-11-27 (×2): 5 mg via ORAL
  Filled 2017-11-26 (×2): qty 1

## 2017-11-26 MED ORDER — ASPIRIN EC 325 MG PO TBEC
325.0000 mg | DELAYED_RELEASE_TABLET | Freq: Every day | ORAL | Status: DC
  Administered 2017-11-27: 325 mg via ORAL
  Filled 2017-11-26: qty 1

## 2017-11-26 MED ORDER — INSULIN ASPART 100 UNIT/ML ~~LOC~~ SOLN
0.0000 [IU] | Freq: Three times a day (TID) | SUBCUTANEOUS | Status: DC
  Administered 2017-11-26 – 2017-11-28 (×6): 3 [IU] via SUBCUTANEOUS
  Administered 2017-11-28 – 2017-11-29 (×2): 5 [IU] via SUBCUTANEOUS
  Administered 2017-11-29 – 2017-11-30 (×2): 3 [IU] via SUBCUTANEOUS
  Administered 2017-11-30: 2 [IU] via SUBCUTANEOUS

## 2017-11-26 MED ORDER — AMLODIPINE BESYLATE 5 MG PO TABS
5.0000 mg | ORAL_TABLET | Freq: Every day | ORAL | Status: DC
  Administered 2017-11-26: 5 mg via ORAL
  Filled 2017-11-26: qty 1

## 2017-11-26 MED ORDER — HYDRALAZINE HCL 20 MG/ML IJ SOLN
10.0000 mg | Freq: Four times a day (QID) | INTRAMUSCULAR | Status: DC | PRN
  Administered 2017-11-27 (×2): 10 mg via INTRAVENOUS
  Filled 2017-11-26 (×2): qty 1

## 2017-11-26 MED ORDER — INSULIN ASPART 100 UNIT/ML ~~LOC~~ SOLN: 0.0000 [IU] | Freq: Three times a day (TID) | SUBCUTANEOUS | Status: DC

## 2017-11-26 MED ORDER — ONDANSETRON HCL 4 MG/2ML IJ SOLN: 4.0000 mg | Freq: Four times a day (QID) | INTRAMUSCULAR | Status: DC | PRN

## 2017-11-26 MED ORDER — LABETALOL HCL 5 MG/ML IV SOLN
20.0000 mg | Freq: Once | INTRAVENOUS | Status: AC
  Administered 2017-11-26: 20 mg via INTRAVENOUS
  Filled 2017-11-26: qty 4

## 2017-11-26 MED ORDER — INSULIN ASPART 100 UNIT/ML ~~LOC~~ SOLN
0.0000 [IU] | Freq: Every day | SUBCUTANEOUS | Status: DC
  Administered 2017-11-26 – 2017-11-29 (×3): 2 [IU] via SUBCUTANEOUS

## 2017-11-26 NOTE — ED Notes (Signed)
ED TO INPATIENT HANDOFF REPORT  Name/Age/Gender Kyle Dawson 52 y.o. male  Code Status   Home/SNF/Other Home  Chief Complaint shob  Level of Care/Admitting Diagnosis ED Disposition    ED Disposition Condition [facility] Area: [place] [facility] [[id number]]  Level of Care: Telemetry [5]  Admit to tele based on following criteria: Monitor QTC interval  Diagnosis: Dyspnea [270623]  Admitting Physician: [place], [place] [[id number]]  Attending Physician: Raiford Noble LATIF [[id number]]  PT Class (Do Not Modify): Observation [104]  PT Acc Code (Do Not Modify): Observation [10022]       Medical History Past Medical History:  Diagnosis Date  . Basal cell carcinoma   . Chest pain   . Diabetes mellitus without complication ([place])   . Edema    lower extremity  . Hypertension   . Squamous cell carcinoma    R ear, nose, each side of face    Allergies Allergies  Allergen Reactions  . Oxycodone     Hyper, sensitive to this    IV Location/Drains/Wounds Patient Lines/Drains/Airways Status   Active Line/Drains/Airways    Name:   Placement date:   Placement time:   Site:   Days:   Peripheral IV 11/26/17 Left Antecubital   11/26/17    1209    Antecubital   less than 1          Labs/Imaging Results for orders placed or performed during the hospital encounter of 11/26/17 (from the past 48 hour(s))  D-dimer, quantitative (not at [facility])     Status: None   Collection Time: 11/26/17 11:50 AM  Result Value Ref Range   D-Dimer, Quant 0.49 0.00 - 0.50 ug/mL-FEU    Comment: (NOTE) At the manufacturer cut-off of 0.50 ug/mL FEU, this assay has been documented to exclude PE with a sensitivity and negative predictive value of 97 to 99%.  At this time, this assay has not been approved by the FDA to exclude DVT/VTE. Results should be correlated with clinical presentation. Performed at [facility], [place] [address], [place], [place]  [address]   CBC with Differential/Platelet     Status: Abnormal   Collection Time: 11/26/17 11:50 AM  Result Value Ref Range   WBC 3.9 (L) 4.0 - 10.5 K/uL   RBC 4.63 4.22 - 5.81 MIL/uL   Hemoglobin 15.9 13.0 - 17.0 g/dL   HCT 45.8 39.0 - 52.0 %   MCV 98.9 78.0 - 100.0 fL   MCH 34.3 (H) 26.0 - 34.0 pg   MCHC 34.7 30.0 - 36.0 g/dL   RDW 13.0 11.5 - 15.5 %   Platelets 185 150 - 400 K/uL   Neutrophils Relative % 65 %   Neutro Abs 2.5 1.7 - 7.7 K/uL   Lymphocytes Relative 20 %   Lymphs Abs 0.8 0.7 - 4.0 K/uL   Monocytes Relative 13 %   Monocytes Absolute 0.5 0.1 - 1.0 K/uL   Eosinophils Relative 1 %   Eosinophils Absolute 0.0 0.0 - 0.7 K/uL   Basophils Relative 1 %   Basophils Absolute 0.0 0.0 - 0.1 K/uL    Comment: Performed at [facility], [place] [address], [place], [place] [address]  Comprehensive metabolic panel     Status: Abnormal   Collection Time: 11/26/17 11:50 AM  Result Value Ref Range   Sodium 141 135 - 145 mmol/L   Potassium 3.8 3.5 - 5.1 mmol/L   Chloride 104 98 - 111 mmol/L  Comment: Please note change in reference range.   CO2 25 22 - 32 mmol/L   Glucose, Bld 207 (H) 70 - 99 mg/dL    Comment: Please note change in reference range.   BUN 11 6 - 20 mg/dL    Comment: Please note change in reference range.   Creatinine, Ser 0.84 0.61 - 1.24 mg/dL   Calcium 9.2 8.9 - 10.3 mg/dL   Total Protein 7.1 6.5 - 8.1 g/dL   Albumin 4.0 3.5 - 5.0 g/dL   AST 41 15 - 41 U/L   ALT 74 (H) 0 - 44 U/L    Comment: Please note change in reference range.   Alkaline Phosphatase 39 38 - 126 U/L   Total Bilirubin 0.9 0.3 - 1.2 mg/dL   GFR calc non Af Amer >60 >60 mL/min   GFR calc Af Amer >60 >60 mL/min    Comment: (NOTE) The eGFR has been calculated using the CKD EPI equation. This calculation has not been validated in all clinical situations. eGFR's persistently <60 mL/min signify possible Chronic Kidney Disease.    Anion gap 12 5 - 15    Comment: Performed at  [facility], [place] [address] [place], [place] [address]  I-stat troponin, ED     Status: None   Collection Time: 11/26/17 12:24 PM  Result Value Ref Range   Troponin i, poc 0.00 0.00 - 0.08 ng/mL   Comment 3            Comment: Due to the release kinetics of cTnI, a negative result within the first hours of the onset of symptoms does not rule out myocardial infarction with certainty. If myocardial infarction is still suspected, repeat the test at appropriate intervals.   Brain natriuretic peptide     Status: Abnormal   Collection Time: 11/26/17  2:41 PM  Result Value Ref Range   B Natriuretic Peptide 117.9 (H) 0.0 - 100.0 pg/mL    Comment: Performed at [facility], [place] [address] [place], [place] [address]   Dg Chest 2 View  Result Date: 11/26/2017 CLINICAL DATA:  SOB and cough today with chest tightness. Hx controlled HTN EXAM: CHEST - 2 VIEW COMPARISON:  09/02/2013 FINDINGS: Lungs are clear.  Relatively low lung volumes. Heart size and mediastinal contours are within normal limits. No effusion. Visualized bones unremarkable. IMPRESSION: No acute cardiopulmonary disease. Electronically Signed   By: Lucrezia Europe M.D.   On: 11/26/2017 12:02    Pending Labs Unresulted Labs (From admission, onward)   Start     Ordered   11/27/17 0500  Hepatitis panel, acute  Tomorrow morning,   R     11/26/17 1654   11/27/17 0500  CBC with Differential/Platelet  Tomorrow morning,   R     11/26/17 1706   11/27/17 0500  Comprehensive metabolic panel  Tomorrow morning,   R     11/26/17 1706   11/27/17 0500  Magnesium  Tomorrow morning,   R     11/26/17 1706   11/27/17 0500  Phosphorus  Tomorrow morning,   R     11/26/17 1706   Signed and Held  HIV antibody (Routine Testing)  Once,   R     Signed and Held   Signed and Held  Hemoglobin A1c  Tomorrow morning,   R    Comments:  To assess prior glycemic control    Signed and Held   Signed and Held  Troponin  I-serum (0,  3, 6 hours)  Now then every 3 hours,   TIMED     Signed and Held   Signed and Held  Creatinine, serum  (enoxaparin (LOVENOX)    CrCl >/= 30 ml/min)  Weekly,   R    Comments:  while on enoxaparin therapy    Signed and Held      Vitals/Pain Today's Vitals   11/26/17 1615 11/26/17 1630 11/26/17 1645 11/26/17 1715  BP: (!) 186/105 (!) 179/103 (!) 176/98 (!) 227/126  Pulse: 78 97 79 86  Resp: (!) 23 (!) 22 (!) 22 19  Temp:      TempSrc:      SpO2: 97% 97% 98% 93%  Weight:      Height:      PainSc:        Isolation Precautions No active isolations  Medications Medications  gi cocktail (Maalox,Lidocaine,Donnatal) (has no administration in time range)  pantoprazole (PROTONIX) EC tablet 40 mg (40 mg Oral Given 11/26/17 1726)  hydrALAZINE (APRESOLINE) injection 5 mg (5 mg Intravenous Given 11/26/17 1214)  labetalol (NORMODYNE,TRANDATE) injection 20 mg (20 mg Intravenous Given 11/26/17 1607)    Mobility walks

## 2017-11-26 NOTE — Progress Notes (Signed)
Kyle Memos, NP notified regarding pt's BP 166/98. [name] I

## 2017-11-26 NOTE — ED Notes (Signed)
MD at bedside. 

## 2017-11-26 NOTE — ED Notes (Signed)
After coughing a couple of times, pt states he is having chest pain/epigastric pain and becomes diaphoretic. Manuela Schwartz RN observed symptoms then notified admitting.

## 2017-11-26 NOTE — ED Notes (Signed)
Patient transported to X-ray 

## 2017-11-26 NOTE — ED Notes (Signed)
Pt ambulated with pulse ox, small instance of SOB. Pt remained >97%

## 2017-11-26 NOTE — ED Provider Notes (Signed)
[place] DEPT Provider Note   CSN: [id number] Arrival date & time: 11/26/17  1125     History   Chief Complaint Chief Complaint  Patient presents with  . Shortness of Breath    HPI Kyle Dawson is a 52 y.o. male.  Patient complains of shortness of breath.  Patient states that it has been getting worse over the last few weeks.  In the last couple days he has been very short of breath on exertion self.  Also patient has some tightness in his chest  The history is provided by the patient.  Shortness of Breath  This is a new problem. The problem occurs continuously.The current episode started more than 1 week ago. The problem has not changed since onset.Pertinent negatives include no fever, no headaches, no cough, no chest pain, no abdominal pain and no rash. It is unknown what precipitated the problem. Risk factors: Hypertension diabetes and obesity. He has tried nothing for the symptoms. The treatment provided no relief. He has had no prior hospitalizations. He has had no prior ED visits.    Past Medical History:  Diagnosis Date  . Basal cell carcinoma   . Chest pain   . Diabetes mellitus without complication ([place])   . Edema    lower extremity  . Hypertension   . Squamous cell carcinoma    R ear, nose, each side of face    Patient Active Problem List   Diagnosis Date Noted  . 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 Controlled type 2 diabetes mellitus without complication ([place]) 88/28/0034  . Obesity 10/06/2014  . Nocturnal leg cramps 10/06/2014  . History of squamous cell carcinoma of skin 07/10/2011  . History  of basal cell carcinoma 07/10/2011  . History of dysplastic nevus 07/10/2011  . Overweight 11/19/2010  . Hypertension 11/09/2010  . Dependent edema 11/09/2010    Past Surgical History:  Procedure Laterality Date  . Removal basal cell carcinoma    . Removal squamous cell carcinoma          Home  Medications    Prior to Admission medications   Medication Sig Start Date End Date Taking? Authorizing Provider  amLODipine (NORVASC) 5 MG tablet Take 5 mg by mouth daily.   Yes [provider]  aspirin 325 MG EC tablet Take 325 mg by mouth daily.   Yes [provider]  BYSTOLIC 5 MG tablet TAKE 1 TABLET (5 MG TOTAL) BY MOUTH DAILY. 09/26/17  Yes Baxley, Cresenciano Lick, MD  empagliflozin (JARDIANCE) 25 MG TABS tablet Take 25 mg by mouth daily. 06/27/17  Yes Elayne Snare, MD  furosemide (LASIX) 40 MG tablet TAKE 1 TABLET (40 MG TOTAL) BY MOUTH DAILY. 09/26/17  Yes Baxley, Cresenciano Lick, MD  metFORMIN (GLUCOPHAGE-XR) 500 MG 24 hr tablet Take 4 tablets (2,000 mg total) by mouth daily with supper. 06/27/17  Yes Elayne Snare, MD  olmesartan (BENICAR) 20 MG tablet Take 40 mg by mouth daily.  10/27/17  Yes [provider]  glucose blood (ONETOUCH VERIO) test strip Use as instructed to check blood sugar once a day dx code E11.9 02/16/16   Elayne Snare, MD  olmesartan (BENICAR) 40 MG tablet Take 1 tablet (40 mg total) by mouth daily. Patient not taking: Reported on 11/26/2017 06/30/17   Elby Showers, MD  [facility] DELICA LANCETS 91P MISC Use to check blood sugar once a day dx code E11.9 02/16/16   Elayne Snare, MD  sildenafil (REVATIO) 20 MG  tablet TAKE AS DIRECTED Patient taking differently: Take 1 tablet by mouth daily as needed for ed 09/26/17   Elayne Snare, MD  sitaGLIPtin (JANUVIA) 100 MG tablet Take 1 tablet (100 mg total) by mouth daily. Patient not taking: Reported on 11/26/2017 02/28/17   Elayne Snare, MD    Family History Family History  Problem Relation Age of Onset  . Peripheral vascular disease Mother 34  . Hypertension Mother   . Heart failure Mother   . CAD Maternal Grandfather   . Heart failure Paternal Grandfather   . Hypertension Father     Social History Social History   Tobacco Use  . Smoking status: Former Research scientist (life sciences)  . Smokeless tobacco: Never Used  Substance Use Topics   . Alcohol use: Yes  . Drug use: No     Allergies   Oxycodone   Review of Systems Review of Systems  Constitutional: Negative for appetite change, fatigue and fever.  HENT: Negative for congestion, ear discharge and sinus pressure.   Eyes: Negative for discharge.  Respiratory: Positive for shortness of breath. Negative for cough.   Cardiovascular: Negative for chest pain.  Gastrointestinal: Negative for abdominal pain and diarrhea.  Genitourinary: Negative for frequency and hematuria.  Musculoskeletal: Negative for back pain.  Skin: Negative for rash.  Neurological: Negative for seizures and headaches.  Psychiatric/Behavioral: Negative for hallucinations.     Physical Exam Updated Vital Signs BP (!) 192/99   Pulse 93   Temp 98 F (36.7 C) (Oral)   Resp 18   Ht 5' 10"  (1.778 m)   Wt 106.6 kg (235 lb)   SpO2 98%   BMI 33.72 kg/m   Physical Exam  Constitutional: He is oriented to person, place, and time. He appears well-developed.  HENT:  Head: Normocephalic.  Eyes: Conjunctivae and EOM are normal. No scleral icterus.  Neck: Neck supple. No thyromegaly present.  Cardiovascular: Normal rate and regular rhythm. Exam reveals no gallop and no friction rub.  No murmur heard. Pulmonary/Chest: No stridor. He has no wheezes. He has no rales. He exhibits no tenderness.  Abdominal: He exhibits no distension. There is no tenderness. There is no rebound.  Musculoskeletal: Normal range of motion. He exhibits no edema.  Lymphadenopathy:    He has no cervical adenopathy.  Neurological: He is oriented to person, place, and time. He exhibits normal muscle tone. Coordination normal.  Skin: No rash noted. No erythema.  Psychiatric: He has a normal mood and affect. His behavior is normal.     ED Treatments / Results  Labs (all labs ordered are listed, but only abnormal results are displayed) Labs Reviewed  CBC WITH DIFFERENTIAL/PLATELET - Abnormal; Notable for the following  components:      Result Value   WBC 3.9 (*)    MCH 34.3 (*)    All other components within normal limits  COMPREHENSIVE METABOLIC PANEL - Abnormal; Notable for the following components:   Glucose, Bld 207 (*)    ALT 74 (*)    All other components within normal limits  BRAIN NATRIURETIC PEPTIDE - Abnormal; Notable for the following components:   B Natriuretic Peptide 117.9 (*)    All other components within normal limits  D-DIMER, QUANTITATIVE (NOT AT [facility])  I-STAT TROPONIN, ED    EKG EKG Interpretation  Date/Time:  Saturday November 26 2017 11:31:35 EDT Ventricular Rate:  90 PR Interval:    QRS Duration: 88 QT Interval:  383 QTC Calculation: 469 R Axis:   -17 Text Interpretation:  Sinus rhythm Borderline short PR interval Borderline left axis deviation Low voltage, precordial leads Borderline T abnormalities, inferior leads Confirmed by Milton Ferguson [phone]) on 11/26/2017 4:00:43 PM   Radiology Dg Chest 2 View  Result Date: 11/26/2017 CLINICAL DATA:  SOB and cough today with chest tightness. Hx controlled HTN EXAM: CHEST - 2 VIEW COMPARISON:  09/02/2013 FINDINGS: Lungs are clear.  Relatively low lung volumes. Heart size and mediastinal contours are within normal limits. No effusion. Visualized bones unremarkable. IMPRESSION: No acute cardiopulmonary disease. Electronically Signed   By: Lucrezia Europe M.D.   On: 11/26/2017 12:02    Procedures Procedures (including critical care time)  Medications Ordered in ED Medications  labetalol (NORMODYNE,TRANDATE) injection 20 mg (has no administration in time range)  hydrALAZINE (APRESOLINE) injection 5 mg (5 mg Intravenous Given 11/26/17 1214)     Initial Impression / Assessment and Plan / ED Course  I have reviewed the triage vital signs and the nursing notes.  Pertinent labs & imaging results that were available during my care of the patient were reviewed by me and considered in my medical decision making (see chart for details).      Labs unremarkable except for elevated sugar and minimally elevated BNP.  EKG shows very mild nonspecific changes.  Chest x-ray unremarkable.  Patient with hypertension that very poorly controlled dyspnea on exertion.  He will be admitted to the hospital for further evaluation and further control of his blood pressure  Final Clinical Impressions(s) / ED Diagnoses   Final diagnoses:  SOB (shortness of breath)    ED Discharge Orders    None       Milton Ferguson, MD 11/26/17 1611

## 2017-11-26 NOTE — H&P (Signed)
History and Physical    Kyle Dawson ZOX:[id number] DOB: 1965/05/20 DOA: 11/26/2017  PCP: Elby Showers, MD   Patient coming from: Home  Chief Complaint: Shortness of Breath on Exertion and Rest  HPI: Kyle Dawson is a 52 y.o. male with medical history significant of hypertension, diabetes mellitus type 2, obesity, history of TIA, and other comorbidities who presented to [facility] emergency room with chief complaint of dyspnea on exertion at rest.  She initially noticed being short of breath with exertion for last few weeks but states is progressively gotten worse to being at rest. Patient also admitted to having some chest tightness with associated with this shortness of breath and states that it is very difficult for him to catch his breath.  Last night he became very dyspneic laying in bed and states that he cannot lay flat and was uncomfortable and started coughing.  Then states he became so short of breath became apneic and states that he coughed so hard that it causes him to become lightheaded.  Denies any headaches, abdominal pain, rashes, nausea or vomiting.  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  [facility] was called to admit this patient for dyspnea on exertion and rest along with tightness of the chest intermittently.  ED Course: Basic blood work done as well as a chest x-ray.  Given 5 mg of IV hydralazine and 20 mg of IV labetalol for his blood pressure  Review of Systems: As per HPI otherwise 10 point review of systems negative.   Past Medical History:  Diagnosis Date  . Basal cell carcinoma   . Chest pain   . Diabetes mellitus without complication ([place])   . Edema    lower extremity  . Hypertension   . Squamous cell carcinoma    R ear, nose, each side of face   Past Surgical History:  Procedure Laterality Date  . Removal basal cell carcinoma    . Removal squamous cell carcinoma     SOCIAL HISTORY   reports that he has quit smoking. He has never used smokeless tobacco. He reports that he drinks alcohol. He reports that he does not use drugs.  Allergies  Allergen Reactions  . Oxycodone     Hyper, sensitive to this    Family History  Problem Relation Age of Onset  . Peripheral vascular disease Mother 4  . Hypertension Mother   . Heart failure Mother   . CAD Maternal Grandfather   . Heart failure Paternal Grandfather   . Hypertension Father    Prior to Admission medications   Medication Sig Start Date End Date Taking? Authorizing Provider  amLODipine (NORVASC) 5 MG tablet Take 5 mg by mouth daily.   Yes [provider]  aspirin 325 MG EC tablet Take 325 mg by mouth daily.   Yes [provider]  BYSTOLIC 5 MG tablet TAKE 1 TABLET (5 MG TOTAL) BY MOUTH DAILY. 09/26/17  Yes Baxley, Cresenciano Lick, MD  empagliflozin (JARDIANCE) 25 MG TABS tablet Take 25 mg by mouth daily. 06/27/17  Yes Elayne Snare, MD  furosemide (LASIX) 40 MG tablet TAKE 1 TABLET (40 MG TOTAL) BY MOUTH DAILY. 09/26/17  Yes Baxley, Cresenciano Lick, MD  metFORMIN (GLUCOPHAGE-XR) 500 MG 24 hr tablet Take 4 tablets (2,000 mg total) by mouth daily with supper. 06/27/17  Yes Elayne Snare, MD  olmesartan (BENICAR) 20 MG tablet Take 40  mg by mouth daily.  10/27/17  Yes [provider]  glucose blood (ONETOUCH VERIO) test strip Use as instructed to check blood sugar once a day dx code E11.9 02/16/16   Elayne Snare, MD  olmesartan (BENICAR) 40 MG tablet Take 1 tablet (40 mg total) by mouth daily. Patient not taking: Reported on 11/26/2017 06/30/17   Elby Showers, MD  [facility] DELICA LANCETS 03E MISC Use to check blood sugar once a day dx code E11.9 02/16/16   Elayne Snare, MD  sildenafil (REVATIO) 20 MG tablet TAKE AS DIRECTED Patient taking differently: Take 1 tablet by mouth daily as needed for ed 09/26/17   Elayne Snare, MD  sitaGLIPtin (JANUVIA) 100 MG tablet Take 1 tablet (100 mg total) by mouth daily. Patient not taking:  Reported on 11/26/2017 02/28/17   Elayne Snare, MD   Physical Exam: Vitals:   11/26/17 1515 11/26/17 1530 11/26/17 1545 11/26/17 1600  BP: (!) 194/114 (!) 197/112 (!) 199/112 (!) 192/99  Pulse: 81 92 93 93  Resp: (!) 27 13 19 18   Temp:      TempSrc:      SpO2: 97% 97% 97% 98%  Weight:      Height:       Constitutional: WN/WD obese Caucasian male in NAD and appears calm  Eyes: Lids and conjunctivae normal, sclerae anicteric  ENMT: External Ears, Nose appear normal. Grossly normal hearing. Mucous membranes are moist. Neck: Appears normal, supple, no cervical masses, normal ROM, no appreciable thyromegaly, no JVD Respiratory: Diminished to auscultation bilaterally, no wheezing, rales, rhonchi or crackles. Normal respiratory effort and patient is not tachypenic. No accessory muscle use.  Cardiovascular: RRR, no murmurs / rubs / gallops. S1 and S2 auscultated. 1+ LE Edema Abdomen: Soft, non-tender, Distended due to body habitus. No masses palpated. No appreciable hepatosplenomegaly. Bowel sounds positive x4.  GU: Deferred. Musculoskeletal: No clubbing / cyanosis of digits/nails. No joint deformity upper and lower extremities.  Skin: No rashes, lesions, ulcers on a limited skin evaluation. No induration; Warm and dry.  Neurologic: CN 2-12 grossly intact with no focal deficits.  Romberg sign and cerebellar reflexes not assessed.  Psychiatric: Normal judgment and insight. Alert and oriented x 3. Normal mood and appropriate affect.   Labs on Admission: I have personally reviewed following labs and imaging studies  CBC: Recent Labs  Lab 11/26/17 1150  WBC 3.9*  NEUTROABS 2.5  HGB 15.9  HCT 45.8  MCV 98.9  PLT 092   Basic Metabolic Panel: Recent Labs  Lab 11/26/17 1150  NA 141  K 3.8  CL 104  CO2 25  GLUCOSE 207*  BUN 11  CREATININE 0.84  CALCIUM 9.2   GFR: Estimated Creatinine Clearance: 122.9 mL/min (by C-G formula based on SCr of 0.84 mg/dL). Liver Function Tests: Recent  Labs  Lab 11/26/17 1150  AST 41  ALT 74*  ALKPHOS 39  BILITOT 0.9  PROT 7.1  ALBUMIN 4.0   No results for input(s): LIPASE, AMYLASE in the last 168 hours. No results for input(s): AMMONIA in the last 168 hours. Coagulation Profile: No results for input(s): INR, PROTIME in the last 168 hours. Cardiac Enzymes: No results for input(s): CKTOTAL, CKMB, CKMBINDEX, TROPONINI in the last 168 hours. BNP (last 3 results) No results for input(s): PROBNP in the last 8760 hours. HbA1C: No results for input(s): HGBA1C in the last 72 hours. CBG: No results for input(s): GLUCAP in the last 168 hours. Lipid Profile: No results for input(s): CHOL, HDL, LDLCALC,  TRIG, CHOLHDL, LDLDIRECT in the last 72 hours. Thyroid Function Tests: No results for input(s): TSH, T4TOTAL, FREET4, T3FREE, THYROIDAB in the last 72 hours. Anemia Panel: No results for input(s): VITAMINB12, FOLATE, FERRITIN, TIBC, IRON, RETICCTPCT in the last 72 hours. Urine analysis:    Component Value Date/Time   BILIRUBINUR negative 04/28/2016 1153   PROTEINUR negative 04/28/2016 1153   UROBILINOGEN negative 04/28/2016 1153   NITRITE negative 04/28/2016 1153   LEUKOCYTESUR Negative 04/28/2016 1153   Sepsis Labs: !!!!!!!!!!!!!!!!!!!!!!!!!!!!!!!!!!!!!!!!!!!! @LABRCNTIP (procalcitonin:4,lacticidven:4) )No results found for this or any previous visit (from the past 240 hour(s)).   Radiological Exams on Admission: Dg Chest 2 View  Result Date: 11/26/2017 CLINICAL DATA:  SOB and cough today with chest tightness. Hx controlled HTN EXAM: CHEST - 2 VIEW COMPARISON:  09/02/2013 FINDINGS: Lungs are clear.  Relatively low lung volumes. Heart size and mediastinal contours are within normal limits. No effusion. Visualized bones unremarkable. IMPRESSION: No acute cardiopulmonary disease. Electronically Signed   By: Lucrezia Europe M.D.   On: 11/26/2017 12:02   EKG: Independently reviewed. Showed a Sinus Rhythm at a rate of 90 with mild T wave  changes.   Assessment/Plan Active Problems:   Uncontrolled hypertension   Dependent edema   Controlled type 2 diabetes mellitus without complication (HCC)   Obesity   Hemispheric carotid artery syndrome   Dyspnea   Hx of transient ischemic attack (TIA)   Alcohol use   Elevated ALT measurement   Dyspnea on exertion and rest associated with intermittent chest tightness possibly related to uncontrolled blood pressure however rule out other causes including ACS and CHF -Place an obstacle telemetry -Chest pain rule out order set -CXR showed No acute cardiopulmonary disease. -Cycle Troponins; Initial POC Troponin was 0.00 -Check EKG in the morning -D-dimer was 0.49 -Check Echocardiogram; BNP was 117.9 and has LE Edema  -Give diuresis with IV Lasix 40 mg x 1 -Check TSH, mag, phosphorus, lipid panel, hemoglobin A 1C -Cardiology consulted and appreciate further evaluation recommendations -Per my discussion with Dr. Claiborne Billings, Cardiology will be by in the morning -Strict I's/O's and Daily Weights -States he has been getting choked up on more foods and get more short of breath however will rule out cardiac causes and have outpatient GI follow-up  Diabetes Mellitus Type 2 -Check hemoglobin A1c; last hemoglobin A1c was 6.8 almost a year ago -Hold home antidiabetic medications including Jardiance and metformin -Place on sliding scale insulin before meals and at bedtime -Continue monitor CBGs  History of TIA and hemispheric carotid artery syndrome -Continue with aspirin 325 mg p.o. Daily -Check lipid panel in a.m.  Obesity Weight loss counseling given  Alcohol Use -Drinks 3-4 times a week -Counseled on alcohol usage -Continue to monitor and if necessary will place on CIWA protocol for alcohol withdrawal  Uncontrolled HTN -BP Elevated in ED -Continue with home amlodipine 5 mg p.o. daily, Bystolic 5 mg daily, and irbesartan 300 mg p.o. daily substitution for Olmesartan -Given hydralazine  and labetalol in the ED -We will place on IV hydralazine 10 mg every 6 PRN for systolic blood pressure in 371 or diastolic blood pressure in 100  Elevated ALT -AST was 41 on admission and ALT was 74 -Check RUQ U/S and Acute Hepatitis Panel -? Possible elevation from Cardiac Etiology -Continue to Monitor and Trend and Repeat CMP in AM  Suspected OSA -Outpatient pulmonary evaluation along with sleep study  DVT prophylaxis: Enoxaparin  Code Status: FULL CODE Family Communication: No family present at bedside  Disposition  Plan: Anticipate D/C back to [place] called: Cardiology Dr. Claiborne Billings; Rounding Cardiologist to be by in AM Admission status: Obs Telemetry  Severity of Illness: The appropriate patient status for this patient is OBSERVATION. Observation status is judged to be reasonable and necessary in order to provide the required intensity of service to ensure the patient's safety. The patient's presenting symptoms, physical exam findings, and initial radiographic and laboratory data in the context of their medical condition is felt to place them at decreased risk for further clinical deterioration. Furthermore, it is anticipated that the patient will be medically stable for discharge from the hospital within 2 midnights of admission. The following factors support the patient status of observation.   " The patient's presenting symptoms include Dyspnea at Rest and Exertion. " The physical exam findings include Diminished breath sounds and mild edema. " The initial radiographic and laboratory data are re-assuring.  Kerney Elbe, D.O. [facility] Pager [phone]  If 7PM-7AM, please contact night-coverage [email or web address] Password [email or web address]  11/26/2017, 4:55 PM

## 2017-11-26 NOTE — ED Triage Notes (Signed)
Pt reports he started coughing 36 hours ago but non productive and last night he states he lost his breath while he started coughing.  Was not doing doing any extreme exercise.  Pt started to panic and was able to regain breath Has had SOB for the past month, but not to the point of apnea. Pt states doing activities such as walking up the stairs or across the parking lot make him short of breath

## 2017-11-26 NOTE — ED Notes (Signed)
Bed: WA17 Expected date:  Expected time:  Means of arrival:  Comments: SOB

## 2017-11-27 ENCOUNTER — Observation Stay (HOSPITAL_COMMUNITY): Payer: BC Managed Care – PPO

## 2017-11-27 ENCOUNTER — Encounter: Payer: Self-pay | Admitting: Internal Medicine

## 2017-11-27 DIAGNOSIS — R0600 Dyspnea, unspecified: Secondary | ICD-10-CM | POA: Diagnosis not present

## 2017-11-27 DIAGNOSIS — I10 Essential (primary) hypertension: Secondary | ICD-10-CM | POA: Diagnosis not present

## 2017-11-27 LAB — CBC WITH DIFFERENTIAL/PLATELET
BASOS PCT: 0 %
Basophils Absolute: 0 10*3/uL (ref 0.0–0.1)
EOS ABS: 0 10*3/uL (ref 0.0–0.7)
EOS PCT: 1 %
HCT: 44.8 % (ref 39.0–52.0)
HEMOGLOBIN: 15.7 g/dL (ref 13.0–17.0)
Lymphocytes Relative: 25 %
Lymphs Abs: 1.4 10*3/uL (ref 0.7–4.0)
MCH: 33.9 pg (ref 26.0–34.0)
MCHC: 35 g/dL (ref 30.0–36.0)
MCV: 96.8 fL (ref 78.0–100.0)
MONOS PCT: 15 %
Monocytes Absolute: 0.8 10*3/uL (ref 0.1–1.0)
NEUTROS PCT: 59 %
Neutro Abs: 3.2 10*3/uL (ref 1.7–7.7)
PLATELETS: 197 10*3/uL (ref 150–400)
RBC: 4.63 MIL/uL (ref 4.22–5.81)
RDW: 13.2 % (ref 11.5–15.5)
WBC: 5.4 10*3/uL (ref 4.0–10.5)

## 2017-11-27 LAB — GLUCOSE, CAPILLARY
GLUCOSE-CAPILLARY: 188 mg/dL — AB (ref 70–99)
GLUCOSE-CAPILLARY: 206 mg/dL — AB (ref 70–99)
GLUCOSE-CAPILLARY: 221 mg/dL — AB (ref 70–99)
Glucose-Capillary: 171 mg/dL — ABNORMAL HIGH (ref 70–99)

## 2017-11-27 LAB — COMPREHENSIVE METABOLIC PANEL
ALBUMIN: 3.9 g/dL (ref 3.5–5.0)
ALK PHOS: 39 U/L (ref 38–126)
ALT: 65 U/L — ABNORMAL HIGH (ref 0–44)
ANION GAP: 11 (ref 5–15)
AST: 35 U/L (ref 15–41)
BUN: 11 mg/dL (ref 6–20)
CALCIUM: 9.1 mg/dL (ref 8.9–10.3)
CO2: 26 mmol/L (ref 22–32)
Chloride: 101 mmol/L (ref 98–111)
Creatinine, Ser: 0.95 mg/dL (ref 0.61–1.24)
GFR calc non Af Amer: >60 mL/min (ref >60)
GLUCOSE: 209 mg/dL — AB (ref 70–99)
Potassium: 3.1 mmol/L — ABNORMAL LOW (ref 3.5–5.1)
Sodium: 138 mmol/L (ref 135–145)
Total Bilirubin: 1.4 mg/dL — ABNORMAL HIGH (ref 0.3–1.2)
Total Protein: 6.9 g/dL (ref 6.5–8.1)

## 2017-11-27 LAB — HEMOGLOBIN A1C
Hgb A1c MFr Bld: 7.5 % — ABNORMAL HIGH (ref 4.8–5.6)
MEAN PLASMA GLUCOSE: 168.55 mg/dL

## 2017-11-27 LAB — URINALYSIS, ROUTINE W REFLEX MICROSCOPIC
Bacteria, UA: NONE SEEN
Bilirubin Urine: NEGATIVE
Glucose, UA: >=500 mg/dL — AB
Hgb urine dipstick: NEGATIVE
Ketones, ur: 5 mg/dL — AB
Leukocytes, UA: NEGATIVE
Nitrite: NEGATIVE
Protein, ur: NEGATIVE mg/dL
Specific Gravity, Urine: 1.023 (ref 1.005–1.030)
pH: 7 (ref 5.0–8.0)

## 2017-11-27 LAB — BASIC METABOLIC PANEL
Anion gap: 11 (ref 5–15)
BUN: 12 mg/dL (ref 6–20)
CALCIUM: 9.3 mg/dL (ref 8.9–10.3)
CO2: 25 mmol/L (ref 22–32)
CREATININE: 0.94 mg/dL (ref 0.61–1.24)
Chloride: 102 mmol/L (ref 98–111)
GFR calc Af Amer: >60 mL/min (ref >60)
GFR calc non Af Amer: >60 mL/min (ref >60)
GLUCOSE: 194 mg/dL — AB (ref 70–99)
Potassium: 4.3 mmol/L (ref 3.5–5.1)
Sodium: 138 mmol/L (ref 135–145)

## 2017-11-27 LAB — HIV ANTIBODY (ROUTINE TESTING W REFLEX): HIV SCREEN 4TH GENERATION: NONREACTIVE

## 2017-11-27 LAB — ECHOCARDIOGRAM COMPLETE
Height: 70 in
Weight: 3767.22 oz

## 2017-11-27 LAB — PHOSPHORUS: Phosphorus: 2.7 mg/dL (ref 2.5–4.6)

## 2017-11-27 LAB — MAGNESIUM
Magnesium: 1.5 mg/dL — ABNORMAL LOW (ref 1.7–2.4)
Magnesium: 2.1 mg/dL (ref 1.7–2.4)

## 2017-11-27 LAB — TROPONIN I

## 2017-11-27 MED ORDER — AMLODIPINE BESYLATE 10 MG PO TABS
10.0000 mg | ORAL_TABLET | Freq: Every day | ORAL | Status: DC
  Administered 2017-11-27 – 2017-11-30 (×4): 10 mg via ORAL
  Filled 2017-11-27 (×4): qty 1

## 2017-11-27 MED ORDER — MAGNESIUM SULFATE 2 GM/50ML IV SOLN
2.0000 g | Freq: Once | INTRAVENOUS | Status: AC
  Administered 2017-11-27: 2 g via INTRAVENOUS
  Filled 2017-11-27: qty 50

## 2017-11-27 MED ORDER — FUROSEMIDE 10 MG/ML IJ SOLN
40.0000 mg | Freq: Once | INTRAMUSCULAR | Status: AC
  Administered 2017-11-27: 40 mg via INTRAVENOUS
  Filled 2017-11-27: qty 4

## 2017-11-27 MED ORDER — POTASSIUM CHLORIDE CRYS ER 20 MEQ PO TBCR
40.0000 meq | EXTENDED_RELEASE_TABLET | Freq: Two times a day (BID) | ORAL | Status: AC
  Administered 2017-11-27 (×2): 40 meq via ORAL
  Filled 2017-11-27 (×2): qty 2

## 2017-11-27 MED ORDER — POTASSIUM CHLORIDE CRYS ER 20 MEQ PO TBCR
20.0000 meq | EXTENDED_RELEASE_TABLET | Freq: Once | ORAL | Status: AC
  Administered 2017-11-27: 20 meq via ORAL
  Filled 2017-11-27: qty 1

## 2017-11-27 MED ORDER — NEBIVOLOL HCL 5 MG PO TABS
5.0000 mg | ORAL_TABLET | Freq: Two times a day (BID) | ORAL | Status: DC
  Administered 2017-11-27 – 2017-11-28 (×2): 5 mg via ORAL
  Filled 2017-11-27 (×2): qty 1

## 2017-11-27 MED ORDER — ASPIRIN EC 81 MG PO TBEC: 81.0000 mg | DELAYED_RELEASE_TABLET | Freq: Every day | ORAL | Status: DC

## 2017-11-27 MED ORDER — ASPIRIN EC 325 MG PO TBEC
325.0000 mg | DELAYED_RELEASE_TABLET | Freq: Every day | ORAL | Status: DC
  Administered 2017-11-28: 325 mg via ORAL
  Filled 2017-11-27: qty 1

## 2017-11-27 NOTE — Consult Note (Signed)
Cardiology Consult    Patient ID: Kyle Dawson MRN: [id number], DOB/AGE: November 29, 1965   Admit date: 11/26/2017 Date of Consult: 11/27/2017  Primary Physician: Elby Showers, MD Primary Cardiologist: New Requesting Provider: Alfredia Ferguson  Patient Profile    Kyle Dawson is a 52 y.o. male with a history of HTN, DM, TIA , who is being seen today for the evaluation of dyspnea and chst tightness at the request of Dr. Alfredia Ferguson  Past Medical History   Past Medical History:  Diagnosis Date  . Basal cell carcinoma   . Chest pain   . Diabetes mellitus without complication ([place])   . Edema    lower extremity  . Hypertension   . Squamous cell carcinoma    R ear, nose, each side of face    Past Surgical History:  Procedure Laterality Date  . Removal basal cell carcinoma    . Removal squamous cell carcinoma       Allergies  Allergies  Allergen Reactions  . Oxycodone     Hyper, sensitive to this    History of Present Illness      PT is a 52 yo with history of HTN, DM, obesity  He is followed in IM by M. Baxley  Notes that he has been treated for HTN for years    REcently had to make some changes in meds because BP was running high.    The pt says that he has noted over the past few wks a tightness in chest and SOB with activity Friday he had a spell of coughing that got bad when laying down   Had to sit up then eased [place] trying to walk stairs   Couldn't get breath Came to [facility] ED to have this looked at   Concerned he may had had PE though no recent injury or long trip The pt says he has noticed some swelling in hands and feet (given lasix in ED with some improvement)    Denies change in diet   Drinks about 3 L per day He also notes some difficult with swallowing pills   Like can't clear.  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 amLODipine  10 mg Oral Daily  . aspirin  325 mg Oral Daily  . enoxaparin (LOVENOX) injection  40 mg Subcutaneous  Q24H  . insulin aspart  0-15 Units Subcutaneous TID WC  . insulin aspart  0-5 Units Subcutaneous QHS  . irbesartan  300 mg Oral Daily  . nebivolol  5 mg Oral Daily  . pantoprazole  40 mg Oral Daily  . potassium chloride  40 mEq Oral BID    Family History    Family History  Problem Relation Age of Onset  . Peripheral vascular disease Mother 65  . Hypertension Mother   . Heart failure Mother   . CAD Maternal Grandfather   . Heart failure Paternal Grandfather   . Hypertension Father    indicated that his mother is alive. He indicated that his father is alive. He indicated that his sister is alive. He indicated that the status of his maternal grandfather is unknown. He indicated that the status of his paternal grandfather is unknown.   Social History    Social History   Socioeconomic History  . Marital status: Single    Spouse name: Not on file  . Number of children: 0  . Years of education: ADN  . Highest education level:  Not on file  Occupational History  . Occupation: ER [employer]    Employer: [employer]  . Occupation: [employer]  Social Needs  . Financial resource strain: Not on file  . Food insecurity:    Worry: Not on file    Inability: Not on file  . Transportation needs:    Medical: Not on file    Non-medical: Not on file  Tobacco Use  . Smoking status: Former Research scientist (life sciences)  . Smokeless tobacco: Never Used  Substance and Sexual Activity  . Alcohol use: Yes  . Drug use: No  . Sexual activity: Not on file  Lifestyle  . Physical activity:    Days per week: Not on file    Minutes per session: Not on file  . Stress: Not on file  Relationships  . Social connections:    Talks on phone: Not on file    Gets together: Not on file    Attends religious service: Not on file    Active member of club or organization: Not on file    Attends meetings of clubs or organizations: Not on file    Relationship status: Not on file  . Intimate partner violence:    Fear of current or ex  partner: Not on file    Emotionally abused: Not on file    Physically abused: Not on file    Forced sexual activity: Not on file  Other Topics Concern  . Not on file  Social History Narrative   Patient drinks 1 cup of coffee a day      Review of Systems    General:  No chills, fever, night sweats or weight changes.  Cardiovascular:  No chest pain, dyspnea on exertion, edema, orthopnea, palpitations, paroxysmal nocturnal dyspnea. Dermatological: No rash, lesions/masses Respiratory: No cough, dyspnea Urologic: No hematuria, dysuria Abdominal:   No nausea, vomiting, diarrhea, bright red blood per rectum, melena, or hematemesis Neurologic:  No visual changes, wkns, changes in mental status. All other systems reviewed and are otherwise negative except as noted above.  Physical Exam    Blood pressure (!) 164/95, pulse 74, temperature 98.6 F (37 C), temperature source Oral, resp. rate 16, height 5' 10"  (1.778 m), weight 235 lb 7.2 oz (106.8 kg), SpO2 97 %.  General: Pleasant, NAD Psych: Normal affect. Neuro: Alert and oriented X 3. Moves all extremities spontaneously. HEENT: Normal  Neck: Supple without bruits  JVP increased   Neck full Lungs:  Resp regular and unlabored, CTA. Heart: RRR no s3, s4, or murmurs. Abdomen: Soft, non-tender, non-distended, BS + x 4.  Extremities: No clubbing, cyanosis .   Tr LE edema. DP/PT/Radials 2+ and equal bilaterally.  Labs    Troponin [facility] of Care Test) Recent Labs    11/26/17 1224  TROPIPOC 0.00   Recent Labs    11/26/17 1853 11/26/17 2112 11/27/17 0036  TROPONINI <0.03 <0.03 <0.03   Lab Results  Component Value Date   WBC 5.4 11/27/2017   HGB 15.7 11/27/2017   HCT 44.8 11/27/2017   MCV 96.8 11/27/2017   PLT 197 11/27/2017    Recent Labs  Lab 11/27/17 0036  NA 138  K 3.1*  CL 101  CO2 26  BUN 11  CREATININE 0.95  CALCIUM 9.1  PROT 6.9  BILITOT 1.4*  ALKPHOS 39  ALT 65*  AST 35  GLUCOSE 209*   Lab Results    Component Value Date   CHOL 124 05/14/2016   HDL 59.30 05/14/2016   LDLCALC 32  05/14/2016   TRIG 164.0 (H) 05/14/2016   Lab Results  Component Value Date   DDIMER 0.49 11/26/2017     Radiology Studies    Dg Chest 2 View  Result Date: 11/26/2017 CLINICAL DATA:  SOB and cough today with chest tightness. Hx controlled HTN EXAM: CHEST - 2 VIEW COMPARISON:  09/02/2013 FINDINGS: Lungs are clear.  Relatively low lung volumes. Heart size and mediastinal contours are within normal limits. No effusion. Visualized bones unremarkable. IMPRESSION: No acute cardiopulmonary disease. Electronically Signed   By: Lucrezia Europe M.D.   On: 11/26/2017 12:02   US Abdomen Limited Ruq  Result Date: 11/26/2017 CLINICAL DATA:  Elevated liver enzymes EXAM: ULTRASOUND ABDOMEN LIMITED RIGHT UPPER QUADRANT COMPARISON:  None. FINDINGS: Gallbladder: No gallstones or wall thickening visualized. There is no pericholecystic fluid. No sonographic Murphy sign noted by sonographer. Common bile duct: Diameter: 7 mm, upper normal. Note portions of the common bile duct are obscured by gas. Liver: No focal lesion identified. Liver echogenicity is increased markedly throughout the liver. Portal vein is patent on color Doppler imaging with normal direction of blood flow towards the liver. IMPRESSION: 1. Marked increase in liver echogenicity diffusely, a finding indicative of hepatic steatosis. While no focal liver lesions are evident on this study, it must be cautioned that the sensitivity of ultrasound for detection focal liver lesions is diminished significantly in this circumstance. 2. Only a portion of the common bile duct could be visualized. Visualized portion is upper normal in size at 7 mm. 3.  No evident gallbladder pathology. Electronically Signed   By: Lowella Grip III M.D.   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  Backround significant for excellent lipids (not on medical Rx, LDL 32; HDL 59, Trig 162) FHx with CAD on mothers but not fathers sided Exam, he still has evid of some mild volume increase and BP is still high EKG without acute changes   Trop negative  I have reviewed echo LVEF and RVEF are normal   There is very mild LVH   Atria are normal in size  Recomm: I would continue to treat BP    Repeat labs to recheck K    REplete as needed then give an additional Lasix 80 Increase Bystolic to 5 bid  Pt quite anxious because of sudden change in BP and because of symtoms and FHx of CAD      1  Dyspnea/pressure.   Enzymes negative   May represent diastolic dysfunciotn in setting of HTN (though BP last week was 140s/) Pt anxious given FhX, his job, concern for CAD WIll set up for CT angiogram of heart  2   HTN Accelerated, urgency. Labs pending   Agree with renal artery USN  Echo shows only mild LVH   Increase b blocker     Continue to diurese  3   DM   Last A1C a little high at 7.5   Follows with Dr Dwyane Dee  Signed, Dorris Carnes, MD 11/27/2017, 8:14 AM  For questions or updates, please contact   Please consult [email or web address] for contact info under Cardiology/STEMI.

## 2017-11-27 NOTE — Progress Notes (Signed)
  Echocardiogram 2D Echocardiogram has been performed.  Kyle Dawson Kyle Dawson 11/27/2017, 8:55 AM

## 2017-11-27 NOTE — Progress Notes (Signed)
PROGRESS NOTE    Kyle Dawson  ZTI:[id number] DOB: August 18, 1965 DOA: 11/26/2017 PCP: Elby Showers, MD   Brief Narrative:  52 year old with past medical history relevant for type 2 diabetes on oral hypoglycemics, possible TIA, hypertension coming in with shortness of breath and found to have hypertensive emergency.   Assessment & Plan:   Active Problems:   Uncontrolled hypertension   Dependent edema   Controlled type 2 diabetes mellitus without complication (HCC)   Obesity   Hemispheric carotid artery syndrome   Dyspnea   Hx of transient ischemic attack (TIA)   Alcohol use   Elevated ALT measurement   #) Shortness of breath due to hypertensive emergency: Patient reports that his blood pressures have normally been controlled however recently he has had difficulty for the past year or so.  His PCP reports that this combination of amlodipine, nebivolol, telmisartan appears to work well for him with additional use of furosemide.  He would prefer to have his PCP adjust this medication. -Continue amlodipine 5 mg daily -Continue nebivolol 5 mg daily -Continue ARB - Hold furosemide -Would consider adding spironolactone, increasing amlodipine, transitioning nebivolol to carvedilol -Echo on 11/27/2017 pending -Secondary hypertension work-up including renal ultrasound with Dopplers, plasma aldosterone concentration, plasma renin activity, TSH, cortisol all ordered and pending.  #) Type 2 diabetes: A1c is reassuring - Hold empagliflozin 25 mg daily -Hold metformin 2000 mg nightly - Hold sitagliptin 100 mg daily -Sliding scale insulin, AC at bedtime  #) History of TIA: -Continue aspirin 325 mg daily  Fluids: Tolerating p.o. Elect lites: Monitor and supplement Nutrition: Heart healthy diet  Prophylaxis: Enoxaparin  Disposition: Pending control of blood pressure  Full code  Consultants:   None  Procedures:   Echo 11/27/2017: Pending  Antimicrobials:    None   Subjective: Patient reports he is doing better.  His shortness of breath resolved with control of his blood pressure.  He denies any exertional chest pain, nausea, vomiting, diarrhea.  Objective: Vitals:   11/26/17 2250 11/26/17 2340 11/27/17 0143 11/27/17 0559  BP: (!) 172/104 (!) 166/98 137/89 (!) 164/95  Pulse: 79 78 85 74  Resp:  12  16  Temp: 98.6 F (37 C) 98.5 F (36.9 C)  98.6 F (37 C)  TempSrc: Oral Oral  Oral  SpO2: 98% 98%  97%  Weight:      Height:        Intake/Output Summary (Last 24 hours) at 11/27/2017 1117 Last data filed at 11/27/2017 0600 Gross per 24 hour  Intake 0 ml  Output 1850 ml  Net -1850 ml   Filed Weights   11/26/17 1135 11/26/17 1832  Weight: 106.6 kg (235 lb) 106.8 kg (235 lb 7.2 oz)    Examination:  General exam: Appears calm and comfortable  Respiratory system: Clear to auscultation. Respiratory effort normal. Cardiovascular system: Distant heart sounds, regular rate and rhythm, no murmurs Gastrointestinal system: Abdomen is nondistended, soft and nontender. No organomegaly or masses felt. Normal bowel sounds heard. Central nervous system: Alert and oriented. No focal neurological deficits. Extremities: Pedal edema Skin: No rashes over visible skin Psychiatry: Judgement and insight appear normal. Mood & affect appropriate.     Data Reviewed: I have personally reviewed following labs and imaging studies  CBC: Recent Labs  Lab 11/26/17 1150 11/27/17 0036  WBC 3.9* 5.4  NEUTROABS 2.5 3.2  HGB 15.9 15.7  HCT 45.8 44.8  MCV 98.9 96.8  PLT 185 825   Basic Metabolic Panel: Recent Labs  Lab 11/26/17 1150 11/27/17 0036  NA 141 138  K 3.8 3.1*  CL 104 101  CO2 25 26  GLUCOSE 207* 209*  BUN 11 11  CREATININE 0.84 0.95  CALCIUM 9.2 9.1  MG  --  1.5*  PHOS  --  2.7   GFR: Estimated Creatinine Clearance: 108.8 mL/min (by C-G formula based on SCr of 0.95 mg/dL). Liver Function Tests: Recent Labs  Lab  11/26/17 1150 11/27/17 0036  AST 41 35  ALT 74* 65*  ALKPHOS 39 39  BILITOT 0.9 1.4*  PROT 7.1 6.9  ALBUMIN 4.0 3.9   No results for input(s): LIPASE, AMYLASE in the last 168 hours. No results for input(s): AMMONIA in the last 168 hours. Coagulation Profile: No results for input(s): INR, PROTIME in the last 168 hours. Cardiac Enzymes: Recent Labs  Lab 11/26/17 1853 11/26/17 2112 11/27/17 0036  TROPONINI <0.03 <0.03 <0.03   BNP (last 3 results) No results for input(s): PROBNP in the last 8760 hours. HbA1C: Recent Labs    11/27/17 0036  HGBA1C 7.5*   CBG: Recent Labs  Lab 11/26/17 1852 11/26/17 2246 11/27/17 0726  GLUCAP 161* 208* 188*   Lipid Profile: No results for input(s): CHOL, HDL, LDLCALC, TRIG, CHOLHDL, LDLDIRECT in the last 72 hours. Thyroid Function Tests: No results for input(s): TSH, T4TOTAL, FREET4, T3FREE, THYROIDAB in the last 72 hours. Anemia Panel: No results for input(s): VITAMINB12, FOLATE, FERRITIN, TIBC, IRON, RETICCTPCT in the last 72 hours. Sepsis Labs: No results for input(s): PROCALCITON, LATICACIDVEN in the last 168 hours.  No results found for this or any previous visit (from the past 240 hour(s)).       Radiology Studies: Dg Chest 2 View  Result Date: 11/26/2017 CLINICAL DATA:  SOB and cough today with chest tightness. Hx controlled HTN EXAM: CHEST - 2 VIEW COMPARISON:  09/02/2013 FINDINGS: Lungs are clear.  Relatively low lung volumes. Heart size and mediastinal contours are within normal limits. No effusion. Visualized bones unremarkable. IMPRESSION: No acute cardiopulmonary disease. Electronically Signed   By: Lucrezia Europe M.D.   On: 11/26/2017 12:02   US Abdomen Limited Ruq  Result Date: 11/26/2017 CLINICAL DATA:  Elevated liver enzymes EXAM: ULTRASOUND ABDOMEN LIMITED RIGHT UPPER QUADRANT COMPARISON:  None. FINDINGS: Gallbladder: No gallstones or wall thickening visualized. There is no pericholecystic fluid. No sonographic  Murphy sign noted by sonographer. Common bile duct: Diameter: 7 mm, upper normal. Note portions of the common bile duct are obscured by gas. Liver: No focal lesion identified. Liver echogenicity is increased markedly throughout the liver. Portal vein is patent on color Doppler imaging with normal direction of blood flow towards the liver. IMPRESSION: 1. Marked increase in liver echogenicity diffusely, a finding indicative of hepatic steatosis. While no focal liver lesions are evident on this study, it must be cautioned that the sensitivity of ultrasound for detection focal liver lesions is diminished significantly in this circumstance. 2. Only a portion of the common bile duct could be visualized. Visualized portion is upper normal in size at 7 mm. 3.  No evident gallbladder pathology. Electronically Signed   By: Lowella Grip III M.D.   On: 11/26/2017 17:56        Scheduled Meds: . amLODipine  10 mg Oral Daily  . [START ON 11/28/2017] aspirin  325 mg Oral Daily  . enoxaparin (LOVENOX) injection  40 mg Subcutaneous Q24H  . insulin aspart  0-15 Units Subcutaneous TID WC  . insulin aspart  0-5 Units Subcutaneous QHS  .  irbesartan  300 mg Oral Daily  . nebivolol  5 mg Oral Daily  . pantoprazole  40 mg Oral Daily   Continuous Infusions:   LOS: 0 days    Time spent: [place], MD [facility]  If 7PM-7AM, please contact night-coverage [email or web address] Password [facility] 11/27/2017, 11:17 AM

## 2017-11-28 DIAGNOSIS — K7581 Nonalcoholic steatohepatitis (NASH): Secondary | ICD-10-CM | POA: Diagnosis present

## 2017-11-28 DIAGNOSIS — I160 Hypertensive urgency: Secondary | ICD-10-CM | POA: Diagnosis not present

## 2017-11-28 DIAGNOSIS — Z8673 Personal history of transient ischemic attack (TIA), and cerebral infarction without residual deficits: Secondary | ICD-10-CM | POA: Diagnosis not present

## 2017-11-28 DIAGNOSIS — E785 Hyperlipidemia, unspecified: Secondary | ICD-10-CM | POA: Diagnosis present

## 2017-11-28 DIAGNOSIS — I313 Pericardial effusion (noninflammatory): Secondary | ICD-10-CM | POA: Diagnosis not present

## 2017-11-28 DIAGNOSIS — I319 Disease of pericardium, unspecified: Secondary | ICD-10-CM | POA: Diagnosis present

## 2017-11-28 DIAGNOSIS — R0600 Dyspnea, unspecified: Secondary | ICD-10-CM | POA: Diagnosis not present

## 2017-11-28 DIAGNOSIS — Z7289 Other problems related to lifestyle: Secondary | ICD-10-CM | POA: Diagnosis not present

## 2017-11-28 DIAGNOSIS — R0609 Other forms of dyspnea: Secondary | ICD-10-CM | POA: Diagnosis not present

## 2017-11-28 DIAGNOSIS — R0602 Shortness of breath: Secondary | ICD-10-CM | POA: Diagnosis present

## 2017-11-28 DIAGNOSIS — R079 Chest pain, unspecified: Secondary | ICD-10-CM | POA: Diagnosis not present

## 2017-11-28 DIAGNOSIS — Z8249 Family history of ischemic heart disease and other diseases of the circulatory system: Secondary | ICD-10-CM | POA: Diagnosis not present

## 2017-11-28 DIAGNOSIS — Z87891 Personal history of nicotine dependence: Secondary | ICD-10-CM | POA: Diagnosis not present

## 2017-11-28 DIAGNOSIS — Z885 Allergy status to narcotic agent status: Secondary | ICD-10-CM | POA: Diagnosis not present

## 2017-11-28 DIAGNOSIS — Z6833 Body mass index (BMI) 33.0-33.9, adult: Secondary | ICD-10-CM | POA: Diagnosis not present

## 2017-11-28 DIAGNOSIS — Z7984 Long term (current) use of oral hypoglycemic drugs: Secondary | ICD-10-CM | POA: Diagnosis not present

## 2017-11-28 DIAGNOSIS — R609 Edema, unspecified: Secondary | ICD-10-CM | POA: Diagnosis present

## 2017-11-28 DIAGNOSIS — E876 Hypokalemia: Secondary | ICD-10-CM | POA: Diagnosis not present

## 2017-11-28 DIAGNOSIS — I10 Essential (primary) hypertension: Secondary | ICD-10-CM | POA: Diagnosis present

## 2017-11-28 DIAGNOSIS — I2510 Atherosclerotic heart disease of native coronary artery without angina pectoris: Secondary | ICD-10-CM | POA: Diagnosis present

## 2017-11-28 DIAGNOSIS — I161 Hypertensive emergency: Secondary | ICD-10-CM | POA: Diagnosis present

## 2017-11-28 DIAGNOSIS — E669 Obesity, unspecified: Secondary | ICD-10-CM | POA: Diagnosis present

## 2017-11-28 DIAGNOSIS — E119 Type 2 diabetes mellitus without complications: Secondary | ICD-10-CM | POA: Diagnosis present

## 2017-11-28 DIAGNOSIS — Z7982 Long term (current) use of aspirin: Secondary | ICD-10-CM | POA: Diagnosis not present

## 2017-11-28 LAB — CBC
HCT: 44.7 % (ref 39.0–52.0)
Hemoglobin: 15.7 g/dL (ref 13.0–17.0)
MCH: 34.4 pg — ABNORMAL HIGH (ref 26.0–34.0)
MCHC: 35.1 g/dL (ref 30.0–36.0)
MCV: 98 fL (ref 78.0–100.0)
Platelets: 197 10*3/uL (ref 150–400)
RBC: 4.56 MIL/uL (ref 4.22–5.81)
RDW: 13.3 % (ref 11.5–15.5)
WBC: 4.3 10*3/uL (ref 4.0–10.5)

## 2017-11-28 LAB — GLUCOSE, CAPILLARY
GLUCOSE-CAPILLARY: 167 mg/dL — AB (ref 70–99)
GLUCOSE-CAPILLARY: 161 mg/dL — AB (ref 70–99)
GLUCOSE-CAPILLARY: 202 mg/dL — AB (ref 70–99)
Glucose-Capillary: 194 mg/dL — ABNORMAL HIGH (ref 70–99)

## 2017-11-28 LAB — COMPREHENSIVE METABOLIC PANEL
ALT: 62 U/L — ABNORMAL HIGH (ref 0–44)
AST: 36 U/L (ref 15–41)
Albumin: 3.7 g/dL (ref 3.5–5.0)
Alkaline Phosphatase: 42 U/L (ref 38–126)
BUN: 14 mg/dL (ref 6–20)
CO2: 23 mmol/L (ref 22–32)
Calcium: 9 mg/dL (ref 8.9–10.3)
Chloride: 101 mmol/L (ref 98–111)
Creatinine, Ser: 1.01 mg/dL (ref 0.61–1.24)
GFR calc non Af Amer: >60 mL/min (ref >60)
Glucose, Bld: 167 mg/dL — ABNORMAL HIGH (ref 70–99)
Potassium: 3.7 mmol/L (ref 3.5–5.1)
Sodium: 134 mmol/L — ABNORMAL LOW (ref 135–145)
Total Protein: 7.1 g/dL (ref 6.5–8.1)

## 2017-11-28 LAB — CORTISOL-AM, BLOOD: Cortisol - AM: 6.5 ug/dL — ABNORMAL LOW (ref 6.7–22.6)

## 2017-11-28 LAB — COMPREHENSIVE METABOLIC PANEL WITH GFR
Anion gap: 10 (ref 5–15)
GFR calc Af Amer: >60 mL/min (ref >60)
Total Bilirubin: 1.4 mg/dL — ABNORMAL HIGH (ref 0.3–1.2)

## 2017-11-28 LAB — HEPATITIS PANEL, ACUTE
HEP A IGM: NEGATIVE
HEP B C IGM: NEGATIVE
Hepatitis B Surface Ag: NEGATIVE

## 2017-11-28 LAB — MAGNESIUM: Magnesium: 2.1 mg/dL (ref 1.7–2.4)

## 2017-11-28 LAB — TSH: TSH: 2.626 u[IU]/mL (ref 0.350–4.500)

## 2017-11-28 MED ORDER — COLCHICINE 0.6 MG PO TABS
0.6000 mg | ORAL_TABLET | Freq: Two times a day (BID) | ORAL | Status: DC
  Administered 2017-11-28 – 2017-11-30 (×5): 0.6 mg via ORAL
  Filled 2017-11-28 (×5): qty 1

## 2017-11-28 MED ORDER — ASPIRIN EC 81 MG PO TBEC
81.0000 mg | DELAYED_RELEASE_TABLET | Freq: Every day | ORAL | Status: DC
  Administered 2017-11-29 – 2017-11-30 (×2): 81 mg via ORAL
  Filled 2017-11-28 (×2): qty 1

## 2017-11-28 MED ORDER — NEBIVOLOL HCL 5 MG PO TABS
5.0000 mg | ORAL_TABLET | Freq: Once | ORAL | Status: AC
  Filled 2017-11-28: qty 1

## 2017-11-28 MED ORDER — NEBIVOLOL HCL 10 MG PO TABS
10.0000 mg | ORAL_TABLET | Freq: Every day | ORAL | Status: DC
  Administered 2017-11-29 – 2017-11-30 (×2): 10 mg via ORAL
  Filled 2017-11-28 (×2): qty 1

## 2017-11-28 NOTE — Progress Notes (Signed)
PROGRESS NOTE    Kyle Dawson  MPN:[id number] DOB: 1965-07-15 DOA: 11/26/2017 PCP: Elby Showers, MD   Brief Narrative:  52 year old with past medical history relevant for type 2 diabetes on oral hypoglycemics, possible TIA, hypertension coming in with shortness of breath and found to have hypertensive emergency.   Assessment & Plan:   Active Problems:   Uncontrolled hypertension   Dependent edema   Controlled type 2 diabetes mellitus without complication (HCC)   Obesity   Hemispheric carotid artery syndrome   Dyspnea   Hx of transient ischemic attack (TIA)   Alcohol use   Elevated ALT measurement   #) Shortness of breath due to hypertensive emergency: Blood pressures well controlled here -Continue amlodipine 10 mg daily -Continue nebivolol 10 mg daily -Continue ARB - Hold furosemide - Cardiology consulted, recommends coronary CT -Secondary hypertension work-up including renal ultrasound with Dopplers, plasma aldosterone concentration, plasma renin activity, TSH, cortisol all ordered and pending.  #) Small to moderate pericardial effusion: Patient noted to have small to moderate pericardial effusion.  Patient symptoms are not typical for pericarditis and no evidence of PR depression or diffuse ST segment elevation on echo. -Echo on 11/27/2017 pending -Cardiology recommends ruling out coronary artery disease and then will consider possibly treating the patient for pericarditis with colchicine and NSAIDs  #) Hepatic steatosis: Patient had mild LFT abnormalities.  Suspect likely [place]. -Ultrasound of right upper quadrant on 11/26/2017 showed evidence of hepatic steatosis  #) Type 2 diabetes: A1c is reassuring - Hold empagliflozin 25 mg daily -Hold metformin 2000 mg nightly - Hold sitagliptin 100 mg daily -Sliding scale insulin, AC at bedtime  #) History of TIA: -Continue aspirin 325 mg daily  Fluids: Tolerating p.o. Elect lites: Monitor and supplement Nutrition:  Heart healthy diet  Prophylaxis: Enoxaparin  Disposition: Pending control of blood pressure  Full code  Consultants:   None  Procedures:   Echo 11/27/2017: Pending  Antimicrobials:   None   Subjective: Patient reports he is doing better.  He continues to have shortness of breath when he leans over or is doing a Valsalva maneuver on the bathroom.  Objective: Vitals:   11/27/17 1345 11/27/17 2117 11/28/17 0553 11/28/17 0938  BP: 128/82 (!) 147/98 (!) 167/101 (!) 137/92  Pulse: 78 85 67 72  Resp: 18 20 18 18   Temp: 97.7 F (36.5 C) 98.1 F (36.7 C) 97.6 F (36.4 C)   TempSrc: Oral Oral Oral   SpO2: 98% 97% 99% 99%  Weight:      Height:        Intake/Output Summary (Last 24 hours) at 11/28/2017 1255 Last data filed at 11/27/2017 2105 Gross per 24 hour  Intake -  Output 1500 ml  Net -1500 ml   Filed Weights   11/26/17 1135 11/26/17 1832  Weight: 106.6 kg (235 lb) 106.8 kg (235 lb 7.2 oz)    Examination:  General exam: Appears calm and comfortable  Respiratory system: Clear to auscultation. Respiratory effort normal. Cardiovascular system: Distant heart sounds, regular rate and rhythm, no murmurs Gastrointestinal system: Abdomen is nondistended, soft and nontender. No organomegaly or masses felt. Normal bowel sounds heard. Central nervous system: Alert and oriented. No focal neurological deficits. Extremities: Pedal edema Skin: No rashes over visible skin Psychiatry: Judgement and insight appear normal. Mood & affect appropriate.     Data Reviewed: I have personally reviewed following labs and imaging studies  CBC: Recent Labs  Lab 11/26/17 1150 11/27/17 0036 11/28/17 0430  WBC 3.9*  5.4 4.3  NEUTROABS 2.5 3.2  --   HGB 15.9 15.7 15.7  HCT 45.8 44.8 44.7  MCV 98.9 96.8 98.0  PLT 185 197 801   Basic Metabolic Panel: Recent Labs  Lab 11/26/17 1150 11/27/17 0036 11/27/17 1316 11/28/17 0430  NA 141 138 138 134*  K 3.8 3.1* 4.3 3.7  CL 104 101  102 101  CO2 25 26 25 23   GLUCOSE 207* 209* 194* 167*  BUN 11 11 12 14   CREATININE 0.84 0.95 0.94 1.01  CALCIUM 9.2 9.1 9.3 9.0  MG  --  1.5* 2.1 2.1  PHOS  --  2.7  --   --    GFR: Estimated Creatinine Clearance: 102.3 mL/min (by C-G formula based on SCr of 1.01 mg/dL). Liver Function Tests: Recent Labs  Lab 11/26/17 1150 11/27/17 0036 11/28/17 0430  AST 41 35 36  ALT 74* 65* 62*  ALKPHOS 39 39 42  BILITOT 0.9 1.4* 1.4*  PROT 7.1 6.9 7.1  ALBUMIN 4.0 3.9 3.7   No results for input(s): LIPASE, AMYLASE in the last 168 hours. No results for input(s): AMMONIA in the last 168 hours. Coagulation Profile: No results for input(s): INR, PROTIME in the last 168 hours. Cardiac Enzymes: Recent Labs  Lab 11/26/17 1853 11/26/17 2112 11/27/17 0036  TROPONINI <0.03 <0.03 <0.03   BNP (last 3 results) No results for input(s): PROBNP in the last 8760 hours. HbA1C: Recent Labs    11/27/17 0036  HGBA1C 7.5*   CBG: Recent Labs  Lab 11/27/17 1129 11/27/17 1729 11/27/17 2116 11/28/17 0637 11/28/17 1134  GLUCAP 206* 171* 221* 167* 161*   Lipid Profile: No results for input(s): CHOL, HDL, LDLCALC, TRIG, CHOLHDL, LDLDIRECT in the last 72 hours. Thyroid Function Tests: Recent Labs    11/28/17 0605  TSH 2.626   Anemia Panel: No results for input(s): VITAMINB12, FOLATE, FERRITIN, TIBC, IRON, RETICCTPCT in the last 72 hours. Sepsis Labs: No results for input(s): PROCALCITON, LATICACIDVEN in the last 168 hours.  No results found for this or any previous visit (from the past 240 hour(s)).       Radiology Studies: US Abdomen Limited Ruq  Result Date: 11/26/2017 CLINICAL DATA:  Elevated liver enzymes EXAM: ULTRASOUND ABDOMEN LIMITED RIGHT UPPER QUADRANT COMPARISON:  None. FINDINGS: Gallbladder: No gallstones or wall thickening visualized. There is no pericholecystic fluid. No sonographic Murphy sign noted by sonographer. Common bile duct: Diameter: 7 mm, upper normal. Note  portions of the common bile duct are obscured by gas. Liver: No focal lesion identified. Liver echogenicity is increased markedly throughout the liver. Portal vein is patent on color Doppler imaging with normal direction of blood flow towards the liver. IMPRESSION: 1. Marked increase in liver echogenicity diffusely, a finding indicative of hepatic steatosis. While no focal liver lesions are evident on this study, it must be cautioned that the sensitivity of ultrasound for detection focal liver lesions is diminished significantly in this circumstance. 2. Only a portion of the common bile duct could be visualized. Visualized portion is upper normal in size at 7 mm. 3.  No evident gallbladder pathology. Electronically Signed   By: Lowella Grip III M.D.   On: 11/26/2017 17:56        Scheduled Meds: . amLODipine  10 mg Oral Daily  . [START ON 11/29/2017] aspirin  81 mg Oral Daily  . colchicine  0.6 mg Oral BID  . enoxaparin (LOVENOX) injection  40 mg Subcutaneous Q24H  . insulin aspart  0-15 Units Subcutaneous  TID WC  . insulin aspart  0-5 Units Subcutaneous QHS  . irbesartan  300 mg Oral Daily  . [START ON 11/29/2017] nebivolol  10 mg Oral Daily  . pantoprazole  40 mg Oral Daily   Continuous Infusions:   LOS: 0 days    Time spent: [place], MD [facility]  If 7PM-7AM, please contact night-coverage [email or web address] Password [facility] 11/28/2017, 12:55 PM

## 2017-11-28 NOTE — Progress Notes (Addendum)
Progress Note  Patient Name: Kyle Dawson Date of Encounter: 11/28/2017  Primary Cardiologist: Dorris Carnes, MD   Subjective   Currently asymptomatic but still concerned about his BP, fluctuating. He reports he had systolic BPs in the 867E overnight.   Inpatient Medications    Scheduled Meds: . amLODipine  10 mg Oral Daily  . aspirin  325 mg Oral Daily  . enoxaparin (LOVENOX) injection  40 mg Subcutaneous Q24H  . insulin aspart  0-15 Units Subcutaneous TID WC  . insulin aspart  0-5 Units Subcutaneous QHS  . irbesartan  300 mg Oral Daily  . nebivolol  5 mg Oral BID  . pantoprazole  40 mg Oral Daily   Continuous Infusions:  PRN Meds: acetaminophen, ALPRAZolam, gi cocktail, hydrALAZINE, ondansetron (ZOFRAN) IV   Vital Signs    Vitals:   11/27/17 1345 11/27/17 2117 11/28/17 0553 11/28/17 0938  BP: 128/82 (!) 147/98 (!) 167/101 (!) 137/92  Pulse: 78 85 67 72  Resp: 18 20 18 18   Temp: 97.7 F (36.5 C) 98.1 F (36.7 C) 97.6 F (36.4 C)   TempSrc: Oral Oral Oral   SpO2: 98% 97% 99% 99%  Weight:      Height:        Intake/Output Summary (Last 24 hours) at 11/28/2017 1036 Last data filed at 11/27/2017 2105 Gross per 24 hour  Intake -  Output 1500 ml  Net -1500 ml   Filed Weights   11/26/17 1135 11/26/17 1832  Weight: 235 lb (106.6 kg) 235 lb 7.2 oz (106.8 kg)    Telemetry    NSR 70s - Personally Reviewed  ECG    NSR PACs- Personally Reviewed  Physical Exam   GEN: moderately obese WM, in No acute distress.   Neck: No JVD Cardiac: RRR, no murmurs, rubs, or gallops.  Respiratory: Clear to auscultation bilaterally. GI: Soft, nontender, non-distended  MS: No edema; No deformity. Neuro:  Nonfocal  Psych: Normal affect   Labs    Chemistry Recent Labs  Lab 11/26/17 1150 11/27/17 0036 11/27/17 1316 11/28/17 0430  NA 141 138 138 134*  K 3.8 3.1* 4.3 3.7  CL 104 101 102 101  CO2 25 26 25 23   GLUCOSE 207* 209* 194* 167*  BUN 11 11 12 14     CREATININE 0.84 0.95 0.94 1.01  CALCIUM 9.2 9.1 9.3 9.0  PROT 7.1 6.9  --  7.1  ALBUMIN 4.0 3.9  --  3.7  AST 41 35  --  36  ALT 74* 65*  --  62*  ALKPHOS 39 39  --  42  BILITOT 0.9 1.4*  --  1.4*  GFRNONAA >60 >60 >60 >60  GFRAA >60 >60 >60 >60  ANIONGAP 12 11 11 10      Hematology Recent Labs  Lab 11/26/17 1150 11/27/17 0036 11/28/17 0430  WBC 3.9* 5.4 4.3  RBC 4.63 4.63 4.56  HGB 15.9 15.7 15.7  HCT 45.8 44.8 44.7  MCV 98.9 96.8 98.0  MCH 34.3* 33.9 34.4*  MCHC 34.7 35.0 35.1  RDW 13.0 13.2 13.3  PLT 185 197 197    Cardiac Enzymes Recent Labs  Lab 11/26/17 1853 11/26/17 2112 11/27/17 0036  TROPONINI <0.03 <0.03 <0.03    Recent Labs  Lab 11/26/17 1224  TROPIPOC 0.00     BNP Recent Labs  Lab 11/26/17 1441  BNP 117.9*     DDimer  Recent Labs  Lab 11/26/17 1150  DDIMER 0.49     Radiology  Dg Chest 2 View  Result Date: 11/26/2017 CLINICAL DATA:  SOB and cough today with chest tightness. Hx controlled HTN EXAM: CHEST - 2 VIEW COMPARISON:  09/02/2013 FINDINGS: Lungs are clear.  Relatively low lung volumes. Heart size and mediastinal contours are within normal limits. No effusion. Visualized bones unremarkable. IMPRESSION: No acute cardiopulmonary disease. Electronically Signed   By: Lucrezia Europe M.D.   On: 11/26/2017 12:02   US Abdomen Limited Ruq  Result Date: 11/26/2017 CLINICAL DATA:  Elevated liver enzymes EXAM: ULTRASOUND ABDOMEN LIMITED RIGHT UPPER QUADRANT COMPARISON:  None. FINDINGS: Gallbladder: No gallstones or wall thickening visualized. There is no pericholecystic fluid. No sonographic Murphy sign noted by sonographer. Common bile duct: Diameter: 7 mm, upper normal. Note portions of the common bile duct are obscured by gas. Liver: No focal lesion identified. Liver echogenicity is increased markedly throughout the liver. Portal vein is patent on color Doppler imaging with normal direction of blood flow towards the liver. IMPRESSION: 1. Marked  increase in liver echogenicity diffusely, a finding indicative of hepatic steatosis. While no focal liver lesions are evident on this study, it must be cautioned that the sensitivity of ultrasound for detection focal liver lesions is diminished significantly in this circumstance. 2. Only a portion of the common bile duct could be visualized. Visualized portion is upper normal in size at 7 mm. 3.  No evident gallbladder pathology. Electronically Signed   By: Lowella Grip III M.D.   On: 11/26/2017 17:56    Cardiac Studies   2D Echo 11/27/17 Study Conclusions  - Left ventricle: The cavity size was normal. Wall thickness was   increased in a pattern of moderate LVH. Systolic function was   normal. The estimated ejection fraction was in the range of 55%   to 60%. Wall motion was normal; there were no regional wall   motion abnormalities. - Aortic valve: Valve area (Vmax): 2.64 cm^2. - Pericardium, extracardiac: A small to moderate pericardial   effusion was identified circumferential to the heart  Patient Profile     Kyle Dawson is a 52 y.o. male, works as a Therapist, sports, with a history of HTN, DM, TIA and family h/o heart disease, who is being seen today for the evaluation of dyspnea and chst tightness at the request of Dr. Alfredia Ferguson.  Assessment & Plan    1. HTN: markedly improved. Initial BP levels were in the 387F systolic and 643P diastolic. BP today is 137/92. Pt still notes fluctuating readings. SBPs in the 160s overnight. Echo with moderate LVH but normal LVEF. Aldosterone + renin ratio pending. Renal dopplers ordered to r/o RAS.  Currently on amlodipine 10, irbesartan 300 and nebivolol 5 mg. SCr normal at 1.01. Continue to monitor.   2. Dyspnea/ CP: ruled out for MI with negative cardiac enzymes x 3. CXR negative for acute cardiopulmonary disease. D-dimer negative. BNP only mildly elevated at 117. Initially treated with Lasix. Now on hold after issues with hypokalemia. EKG NSR with PACs and  no ischemic abnormalities. NSR on tele. Echo with normal LVEF and wall motion. Moderate LVH present. No valvular heart disease but mild to moderate pericardial effusion noted. Plan was for coronary CTA, but suspect that his symptoms may be secondary to pericardial effusion ? Pericarditis. We will start with trial of colchicine, 0.6 mg BID. Will try adding additional NSAIDs tomorrow. Will reassess response to therapy tomorrow.   3. Pericardial Effusion: per above. ? Pericarditis. Adding trial of colchicine today. Will add additional NSAIDs tomorrow.  For questions or updates, please contact [place] Please consult [email or web address] for contact info under Cardiology/STEMI.      Signed, Lyda Jester, PA-C  11/28/2017, 10:36 AM

## 2017-11-29 ENCOUNTER — Ambulatory Visit (HOSPITAL_COMMUNITY): Payer: BC Managed Care – PPO

## 2017-11-29 ENCOUNTER — Ambulatory Visit (HOSPITAL_COMMUNITY)
Admit: 2017-11-29 | Discharge: 2017-11-29 | Disposition: A | Discharge status: Home / Self Care | Payer: BC Managed Care – PPO | Attending: Cardiovascular Disease | Admitting: Cardiovascular Disease

## 2017-11-29 DIAGNOSIS — R079 Chest pain, unspecified: Secondary | ICD-10-CM

## 2017-11-29 DIAGNOSIS — R0600 Dyspnea, unspecified: Secondary | ICD-10-CM | POA: Insufficient documentation

## 2017-11-29 DIAGNOSIS — I313 Pericardial effusion (noninflammatory): Secondary | ICD-10-CM

## 2017-11-29 DIAGNOSIS — R0602 Shortness of breath: Secondary | ICD-10-CM

## 2017-11-29 LAB — CBC WITH DIFFERENTIAL/PLATELET
BASOS ABS: 0 10*3/uL (ref 0.0–0.1)
BASOS PCT: 1 %
Eosinophils Absolute: 0.2 10*3/uL (ref 0.0–0.7)
Eosinophils Relative: 4 %
HEMATOCRIT: 45.5 % (ref 39.0–52.0)
HEMOGLOBIN: 16.1 g/dL (ref 13.0–17.0)
Lymphocytes Relative: 22 %
Lymphs Abs: 0.9 10*3/uL (ref 0.7–4.0)
MCH: 34.5 pg — ABNORMAL HIGH (ref 26.0–34.0)
MCHC: 35.4 g/dL (ref 30.0–36.0)
MCV: 97.6 fL (ref 78.0–100.0)
MONOS PCT: 17 %
Monocytes Absolute: 0.7 10*3/uL (ref 0.1–1.0)
NEUTROS ABS: 2.2 10*3/uL (ref 1.7–7.7)
NEUTROS PCT: 56 %
Platelets: 197 10*3/uL (ref 150–400)
RBC: 4.66 MIL/uL (ref 4.22–5.81)
RDW: 13 % (ref 11.5–15.5)
WBC: 4 10*3/uL (ref 4.0–10.5)

## 2017-11-29 LAB — COMPREHENSIVE METABOLIC PANEL
ALK PHOS: 42 U/L (ref 38–126)
ALT: 98 U/L — ABNORMAL HIGH (ref 0–44)
ANION GAP: 7 (ref 5–15)
AST: 71 U/L — ABNORMAL HIGH (ref 15–41)
Albumin: 3.8 g/dL (ref 3.5–5.0)
BUN: 18 mg/dL (ref 6–20)
CALCIUM: 9.2 mg/dL (ref 8.9–10.3)
CO2: 25 mmol/L (ref 22–32)
Chloride: 103 mmol/L (ref 98–111)
Creatinine, Ser: 1.01 mg/dL (ref 0.61–1.24)
GFR calc non Af Amer: >60 mL/min (ref >60)
Glucose, Bld: 251 mg/dL — ABNORMAL HIGH (ref 70–99)
Potassium: 4.7 mmol/L (ref 3.5–5.1)
Sodium: 135 mmol/L (ref 135–145)
TOTAL PROTEIN: 6.8 g/dL (ref 6.5–8.1)
Total Bilirubin: 2.1 mg/dL — ABNORMAL HIGH (ref 0.3–1.2)

## 2017-11-29 LAB — GLUCOSE, CAPILLARY
GLUCOSE-CAPILLARY: 134 mg/dL — AB (ref 70–99)
Glucose-Capillary: 135 mg/dL — ABNORMAL HIGH (ref 70–99)
Glucose-Capillary: 211 mg/dL — ABNORMAL HIGH (ref 70–99)
Glucose-Capillary: 212 mg/dL — ABNORMAL HIGH (ref 70–99)

## 2017-11-29 LAB — MAGNESIUM: MAGNESIUM: 1.9 mg/dL (ref 1.7–2.4)

## 2017-11-29 MED ORDER — IOPAMIDOL (ISOVUE-370) INJECTION 76%
100.0000 mL | Freq: Once | INTRAVENOUS | Status: AC | PRN
  Administered 2017-11-29: 80 mL via INTRAVENOUS

## 2017-11-29 MED ORDER — METOPROLOL TARTRATE 5 MG/5ML IV SOLN
5.0000 mg | INTRAVENOUS | Status: DC | PRN
  Administered 2017-11-29: 5 mg via INTRAVENOUS

## 2017-11-29 MED ORDER — HYDRALAZINE HCL 10 MG PO TABS
10.0000 mg | ORAL_TABLET | Freq: Three times a day (TID) | ORAL | Status: DC
  Administered 2017-11-29 – 2017-11-30 (×4): 10 mg via ORAL
  Filled 2017-11-29 (×4): qty 1

## 2017-11-29 MED ORDER — NITROGLYCERIN 0.4 MG SL SUBL
0.8000 mg | SUBLINGUAL_TABLET | Freq: Once | SUBLINGUAL | Status: AC
  Administered 2017-11-29: 0.8 mg via SUBLINGUAL

## 2017-11-29 MED ORDER — METOPROLOL TARTRATE 25 MG PO TABS
25.0000 mg | ORAL_TABLET | ORAL | Status: AC
  Administered 2017-11-29: 25 mg via ORAL
  Filled 2017-11-29: qty 1

## 2017-11-29 NOTE — Progress Notes (Signed)
Progress Note  Patient Name: Kyle Dawson Date of Encounter: 11/29/2017  Primary Cardiologist: Dorris Carnes, MD   Subjective   Denies any chest pain or SOB.    Inpatient Medications    Scheduled Meds: . amLODipine  10 mg Oral Daily  . aspirin  81 mg Oral Daily  . colchicine  0.6 mg Oral BID  . enoxaparin (LOVENOX) injection  40 mg Subcutaneous Q24H  . insulin aspart  0-15 Units Subcutaneous TID WC  . insulin aspart  0-5 Units Subcutaneous QHS  . irbesartan  300 mg Oral Daily  . nebivolol  10 mg Oral Daily  . pantoprazole  40 mg Oral Daily   Continuous Infusions:  PRN Meds: acetaminophen, ALPRAZolam, gi cocktail, hydrALAZINE, ondansetron (ZOFRAN) IV   Vital Signs    Vitals:   11/28/17 0938 11/28/17 1303 11/28/17 2107 11/29/17 0429  BP: (!) 137/92 (!) 154/86 (!) 147/96 (!) 144/99  Pulse: 72 73 67 68  Resp: 18 16  12   Temp:  (!) 97.5 F (36.4 C) 98.6 F (37 C) 97.8 F (36.6 C)  TempSrc:  Oral Oral Oral  SpO2: 99% 98% 99% 99%  Weight:      Height:       No intake or output data in the 24 hours ending 11/29/17 0833 Filed Weights   11/26/17 1135 11/26/17 1832  Weight: 235 lb (106.6 kg) 235 lb 7.2 oz (106.8 kg)    Telemetry    NSR - Personally Reviewed  ECG    No new EKG to review - Personally Reviewed  Physical Exam   GEN: No acute distress.   Neck: No JVD Cardiac: RRR, no murmurs, rubs, or gallops.  Respiratory: Clear to auscultation bilaterally. GI: Soft, nontender, non-distended  MS: No edema; No deformity. Neuro:  Nonfocal  Psych: Normal affect   Labs    Chemistry Recent Labs  Lab 11/26/17 1150 11/27/17 0036 11/27/17 1316 11/28/17 0430  NA 141 138 138 134*  K 3.8 3.1* 4.3 3.7  CL 104 101 102 101  CO2 25 26 25 23   GLUCOSE 207* 209* 194* 167*  BUN 11 11 12 14   CREATININE 0.84 0.95 0.94 1.01  CALCIUM 9.2 9.1 9.3 9.0  PROT 7.1 6.9  --  7.1  ALBUMIN 4.0 3.9  --  3.7  AST 41 35  --  36  ALT 74* 65*  --  62*  ALKPHOS 39 39  --  42   BILITOT 0.9 1.4*  --  1.4*  GFRNONAA >60 >60 >60 >60  GFRAA >60 >60 >60 >60  ANIONGAP 12 11 11 10      Hematology Recent Labs  Lab 11/26/17 1150 11/27/17 0036 11/28/17 0430  WBC 3.9* 5.4 4.3  RBC 4.63 4.63 4.56  HGB 15.9 15.7 15.7  HCT 45.8 44.8 44.7  MCV 98.9 96.8 98.0  MCH 34.3* 33.9 34.4*  MCHC 34.7 35.0 35.1  RDW 13.0 13.2 13.3  PLT 185 197 197    Cardiac Enzymes Recent Labs  Lab 11/26/17 1853 11/26/17 2112 11/27/17 0036  TROPONINI <0.03 <0.03 <0.03    Recent Labs  Lab 11/26/17 1224  TROPIPOC 0.00     BNP Recent Labs  Lab 11/26/17 1441  BNP 117.9*     DDimer  Recent Labs  Lab 11/26/17 1150  DDIMER 0.49     Radiology    No results found.  Cardiac Studies   2D Echo 11/27/17 Study Conclusions  - Left ventricle: The cavity size was normal. Wall thickness  was increased in a pattern of moderate LVH. Systolic function was normal. The estimated ejection fraction was in the range of 55% to 60%. Wall motion was normal; there were no regional wall motion abnormalities. - Aortic valve: Valve area (Vmax): 2.64 cm^2. 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 Alfredia Ferguson.  Assessment & Plan    1. HTN:  -BP remains elevated ranging from 144/99 to 170/105 mmHg -Aldosterone + renin ratio pending -Renal artery duplex pending to assess for RAS.  -Continue amlodipine 10 mg daily, irbesartan 158 mg daily, Bystolic 10 mg daily -Will add hydralazine 10 mg 3 times daily to try to get blood pressure under better control  2. Dyspnea/CP - ruled out for MI with negative cardiac enzymes x 3.  -CXR negative for acute cardiopulmonary disease.    -D-dimer negative. -BNP only mildly elevated at 117.  -Echo with normal LVEF and wall motion. Moderate LVH present.  -mild to moderate pericardial effusion noted, which may be contributing -He also has a history of GERD so he could have esophageal spasm -He has multiple cardiac risk factors including obesity, diabetes mellitus and hypertension -Plan for coronary CTA with FFR today to rule out CAD.  Heart rate is still in the 70s so we will give a dose of Lopressor 25 mg p.o. Now -Continue aspirin 81 mg daily  3. Pericardial Effusion: -mild to moderate on echo.  -Continue colchicine 0.6 mg BID x 3 months.  -If coronary CTA shows no significant CAD then will stop aspirin and allow initiation of high-dose NSAIDs for 2 weeks to treat pericardial effusion -Repeat echo in 2 to 3 months to see if effusion has resolved  For questions or updates, please contact [place] Please consult [email or web address] for contact info under Cardiology/STEMI.      Signed, Lyda Jester, PA-C  11/29/2017, 8:33 AM

## 2017-11-29 NOTE — Progress Notes (Signed)
PROGRESS NOTE    Kyle Dawson  GGE:[id number] DOB: [birth year] DOA: 11/26/2017 PCP: Elby Showers, MD    Brief Narrative:  52 year old with past medical history relevant for type 2 diabetes on oral hypoglycemics, possible TIA, hypertension coming in with shortness of breath and found to have hypertensive emergency.     Assessment & Plan:   Active Problems:   Uncontrolled hypertension   Dependent edema   Controlled type 2 diabetes mellitus without complication (HCC)   Obesity   Hemispheric carotid artery syndrome   Dyspnea   Hx of transient ischemic attack (TIA)   Alcohol use   Elevated ALT measurement  #1 shortness of breath likely secondary to hypertensive emergency Clinical improvement.  Blood pressure improved on current regimen of Norvasc 10 mg daily, Avapro 465 mg daily, Bystolic 10 mg daily.  Blood pressure still somewhat elevated.  TSH within normal limits at 2.626.  Secondary causes of hypertension pending with renal duplex pending, aldosterone, plasma renin activity, cortisol pending.  Patient for coronary CT today.  Check a fasting lipid panel.  Per cardiology.  2.  Small to moderate pericardial effusion Noted on 2D echo.  Patient denied any worsening of chest tightness in the supine position or improvement on leaning forward.  EKG does not have PR depression or diffuse ST elevation.  Coronary CT pending to rule out coronary artery disease.  Continue colchicine.  If coronary CT negative may need to add NSAIDs.  3.  Hepatic steatosis Mild LFT abnormalities.  Likely Karlene Lineman.  Right upper quadrant ultrasound with evidence of hepatic steatosis.  Check a fasting lipid panel and if LDL elevated may benefit from being started on a statin.  Weight loss.  4.  Type 2 diabetes mellitus Hemoglobin A1c 7.5.  CBG this morning is 135.  Oral hypoglycemic agents on hold.  Continue sliding scale insulin.  5.  History of TIA Continue aspirin for secondary stroke prophylaxis.  DVT  prophylaxis: Lovenox Code Status: Full Family Communication: Updated patient.  No family at bedside. Disposition Plan: Home when clinically improved.  Cardiac work-up completed and per cardiology.   Consultants:   Cardiology: Dr. Harrington Challenger 11/27/2017  Procedures:   Coronary CT 11/29/2017 pending  Right upper quadrant ultrasound 11/26/2017  2D echo 11/27/2017  Chest x-ray 11/26/2017    Antimicrobials:   None   Subjective: Patient sitting up on his computer.  Denies any chest pain.  Denies any shortness of breath.  Feeling better than on admission.  Objective: Vitals:   11/28/17 1303 11/28/17 2107 11/29/17 0429 11/29/17 0946  BP: (!) 154/86 (!) 147/96 (!) 144/99 (!) 170/105  Pulse: 73 67 68 74  Resp: 16  12   Temp: (!) 97.5 F (36.4 C) 98.6 F (37 C) 97.8 F (36.6 C)   TempSrc: Oral Oral Oral   SpO2: 98% 99% 99% 97%  Weight:      Height:       No intake or output data in the 24 hours ending 11/29/17 1128 Filed Weights   11/26/17 1135 11/26/17 1832  Weight: 106.6 kg (235 lb) 106.8 kg (235 lb 7.2 oz)    Examination:  General exam: Appears calm and comfortable  Respiratory system: Clear to auscultation. Respiratory effort normal. Cardiovascular system: S1 & S2 heard, RRR. No JVD, murmurs, rubs, gallops or clicks. No pedal edema. Gastrointestinal system: Abdomen is nondistended, soft and nontender. No organomegaly or masses felt. Normal bowel sounds heard. Central nervous system: Alert and oriented. No focal neurological deficits. Extremities: Symmetric  5 x 5 power. Skin: No rashes, lesions or ulcers Psychiatry: Judgement and insight appear normal. Mood & affect appropriate.     Data Reviewed: I have personally reviewed following labs and imaging studies  CBC: Recent Labs  Lab 11/26/17 1150 11/27/17 0036 11/28/17 0430 11/29/17 0844  WBC 3.9* 5.4 4.3 4.0  NEUTROABS 2.5 3.2  --  2.2  HGB 15.9 15.7 15.7 16.1  HCT 45.8 44.8 44.7 45.5  MCV 98.9 96.8 98.0 97.6    PLT 185 197 197 706   Basic Metabolic Panel: Recent Labs  Lab 11/26/17 1150 11/27/17 0036 11/27/17 1316 11/28/17 0430 11/29/17 0844  NA 141 138 138 134* 135  K 3.8 3.1* 4.3 3.7 4.7  CL 104 101 102 101 103  CO2 25 26 25 23 25   GLUCOSE 207* 209* 194* 167* 251*  BUN 11 11 12 14 18   CREATININE 0.84 0.95 0.94 1.01 1.01  CALCIUM 9.2 9.1 9.3 9.0 9.2  MG  --  1.5* 2.1 2.1 1.9  PHOS  --  2.7  --   --   --    GFR: Estimated Creatinine Clearance: 102.3 mL/min (by C-G formula based on SCr of 1.01 mg/dL). Liver Function Tests: Recent Labs  Lab 11/26/17 1150 11/27/17 0036 11/28/17 0430 11/29/17 0844  AST 41 35 36 71*  ALT 74* 65* 62* 98*  ALKPHOS 39 39 42 42  BILITOT 0.9 1.4* 1.4* 2.1*  PROT 7.1 6.9 7.1 6.8  ALBUMIN 4.0 3.9 3.7 3.8   No results for input(s): LIPASE, AMYLASE in the last 168 hours. No results for input(s): AMMONIA in the last 168 hours. Coagulation Profile: No results for input(s): INR, PROTIME in the last 168 hours. Cardiac Enzymes: Recent Labs  Lab 11/26/17 1853 11/26/17 2112 11/27/17 0036  TROPONINI <0.03 <0.03 <0.03   BNP (last 3 results) No results for input(s): PROBNP in the last 8760 hours. HbA1C: Recent Labs    11/27/17 0036  HGBA1C 7.5*   CBG: Recent Labs  Lab 11/28/17 0637 11/28/17 1134 11/28/17 1625 11/28/17 2107 11/29/17 0743  GLUCAP 167* 161* 202* 194* 135*   Lipid Profile: No results for input(s): CHOL, HDL, LDLCALC, TRIG, CHOLHDL, LDLDIRECT in the last 72 hours. Thyroid Function Tests: Recent Labs    11/28/17 0605  TSH 2.626   Anemia Panel: No results for input(s): VITAMINB12, FOLATE, FERRITIN, TIBC, IRON, RETICCTPCT in the last 72 hours. Sepsis Labs: No results for input(s): PROCALCITON, LATICACIDVEN in the last 168 hours.  No results found for this or any previous visit (from the past 240 hour(s)).       Radiology Studies: No results found.      Scheduled Meds: . amLODipine  10 mg Oral Daily  . aspirin   81 mg Oral Daily  . colchicine  0.6 mg Oral BID  . enoxaparin (LOVENOX) injection  40 mg Subcutaneous Q24H  . insulin aspart  0-15 Units Subcutaneous TID WC  . insulin aspart  0-5 Units Subcutaneous QHS  . irbesartan  300 mg Oral Daily  . nebivolol  10 mg Oral Daily  . pantoprazole  40 mg Oral Daily   Continuous Infusions:   LOS: 1 day    Time spent: 40 mins    Irine Seal, MD [facility] Pager [phone]  If 7PM-7AM, please contact night-coverage [email or web address] Password [facility] 11/29/2017, 11:28 AM

## 2017-11-30 ENCOUNTER — Inpatient Hospital Stay (HOSPITAL_COMMUNITY): Payer: BC Managed Care – PPO

## 2017-11-30 ENCOUNTER — Other Ambulatory Visit: Payer: Self-pay | Admitting: Cardiology

## 2017-11-30 DIAGNOSIS — I3139 Other pericardial effusion (noninflammatory): Secondary | ICD-10-CM

## 2017-11-30 DIAGNOSIS — E7849 Other hyperlipidemia: Secondary | ICD-10-CM

## 2017-11-30 DIAGNOSIS — I160 Hypertensive urgency: Secondary | ICD-10-CM

## 2017-11-30 DIAGNOSIS — I313 Pericardial effusion (noninflammatory): Secondary | ICD-10-CM

## 2017-11-30 LAB — BASIC METABOLIC PANEL
Anion gap: 6 (ref 5–15)
BUN: 18 mg/dL (ref 6–20)
CALCIUM: 8.8 mg/dL — AB (ref 8.9–10.3)
CHLORIDE: 106 mmol/L (ref 98–111)
CO2: 26 mmol/L (ref 22–32)
CREATININE: 0.87 mg/dL (ref 0.61–1.24)
GFR calc Af Amer: >60 mL/min (ref >60)
GFR calc non Af Amer: >60 mL/min (ref >60)
Glucose, Bld: 201 mg/dL — ABNORMAL HIGH (ref 70–99)
Potassium: 4.1 mmol/L (ref 3.5–5.1)
SODIUM: 138 mmol/L (ref 135–145)

## 2017-11-30 LAB — GLUCOSE, CAPILLARY
GLUCOSE-CAPILLARY: 137 mg/dL — AB (ref 70–99)
Glucose-Capillary: 169 mg/dL — ABNORMAL HIGH (ref 70–99)

## 2017-11-30 LAB — LIPID PANEL
CHOLESTEROL: 144 mg/dL (ref 0–200)
HDL: 57 mg/dL (ref >40)
LDL Cholesterol: 66 mg/dL (ref 0–99)
Total CHOL/HDL Ratio: 2.5 RATIO
Triglycerides: 107 mg/dL (ref <150)
VLDL: 21 mg/dL (ref 0–40)

## 2017-11-30 MED ORDER — HYDRALAZINE HCL 10 MG PO TABS: 10.0000 mg | ORAL_TABLET | Freq: Three times a day (TID) | ORAL | 0 refills | Status: DC

## 2017-11-30 MED ORDER — ISOSORBIDE MONONITRATE ER 30 MG PO TB24
15.0000 mg | ORAL_TABLET | Freq: Every day | ORAL | Status: DC
  Administered 2017-11-30: 15 mg via ORAL
  Filled 2017-11-30: qty 1

## 2017-11-30 MED ORDER — ATORVASTATIN CALCIUM 10 MG PO TABS: 10.0000 mg | ORAL_TABLET | Freq: Every day | ORAL | 0 refills | Status: DC

## 2017-11-30 MED ORDER — COLCHICINE 0.6 MG PO TABS: 0.6000 mg | ORAL_TABLET | Freq: Two times a day (BID) | ORAL | 2 refills | Status: DC

## 2017-11-30 MED ORDER — IBUPROFEN 600 MG PO TABS: 600.0000 mg | ORAL_TABLET | Freq: Three times a day (TID) | ORAL | 0 refills | Status: AC

## 2017-11-30 MED ORDER — ATORVASTATIN CALCIUM 10 MG PO TABS: 10.0000 mg | ORAL_TABLET | Freq: Every day | ORAL | Status: DC

## 2017-11-30 MED ORDER — IRBESARTAN 300 MG PO TABS: 300.0000 mg | ORAL_TABLET | Freq: Every day | ORAL | 0 refills | Status: DC

## 2017-11-30 MED ORDER — AMLODIPINE BESYLATE 10 MG PO TABS: 10.0000 mg | ORAL_TABLET | Freq: Every day | ORAL | 0 refills | Status: DC

## 2017-11-30 MED ORDER — PANTOPRAZOLE SODIUM 40 MG PO TBEC: 40.0000 mg | DELAYED_RELEASE_TABLET | Freq: Every day | ORAL | 2 refills | Status: DC

## 2017-11-30 MED ORDER — ISOSORBIDE MONONITRATE ER 30 MG PO TB24: 15.0000 mg | ORAL_TABLET | Freq: Every day | ORAL | 0 refills | Status: DC

## 2017-11-30 MED ORDER — NEBIVOLOL HCL 10 MG PO TABS: 10.0000 mg | ORAL_TABLET | Freq: Every day | ORAL | 0 refills | Status: DC

## 2017-11-30 MED ORDER — IBUPROFEN 200 MG PO TABS
600.0000 mg | ORAL_TABLET | Freq: Three times a day (TID) | ORAL | Status: DC
  Administered 2017-11-30: 600 mg via ORAL
  Filled 2017-11-30: qty 3

## 2017-11-30 MED ORDER — ASPIRIN 81 MG PO TBEC: 81.0000 mg | DELAYED_RELEASE_TABLET | Freq: Every day | ORAL | Status: DC

## 2017-11-30 MED FILL — PANTOPRAZOLE SOD DR 40 MG T: 40 | 30 days supply | Qty: 30 | Fill #0

## 2017-11-30 MED FILL — IBUPROFEN 600 MG TABLET: 600 | 14 days supply | Qty: 42 | Fill #0

## 2017-11-30 MED FILL — BYSTOLIC 10 MG TABLET: 10 | 30 days supply | Qty: 30 | Fill #0

## 2017-11-30 MED FILL — hydrALAZINE HCL 10 MG TABS: 10 | 30 days supply | Qty: 90 | Fill #0

## 2017-11-30 MED FILL — IRBESARTAN 300 MG TABLET: 300 | 30 days supply | Qty: 30 | Fill #0

## 2017-11-30 MED FILL — COLCHICINE 0.6 MG TABS: 0.6 | 30 days supply | Qty: 60 | Fill #0

## 2017-11-30 MED FILL — AMLODIPINE BESYLATE 10 MG T: 10 | 30 days supply | Qty: 30 | Fill #0

## 2017-11-30 MED FILL — ATORVASTATIN 10 MG TABLET: 10 | 30 days supply | Qty: 30 | Fill #0

## 2017-11-30 NOTE — Progress Notes (Signed)
  Vascular prelim--- Renal artery duplex completed: no evidence of renal artery stenosis. Landry Mellow, RDMS, RVT

## 2017-11-30 NOTE — Progress Notes (Signed)
    Order for repeat 2D echo and FLP and ALT labs have been placed. Our office will call pt to arrange.   Deborh Pense SimmonsPA-C

## 2017-11-30 NOTE — Discharge Summary (Signed)
Physician Discharge Summary  Kyle Dawson PIR:[id number] DOB: 1965/11/12 DOA: 11/26/2017  PCP: Elby Showers, MD  Admit date: 11/26/2017 Discharge date: 11/30/2017  Time spent: 60 minutes  Recommendations for Outpatient Follow-up:  1. Follow-up with Dr. Dorris Carnes or extender in 2 weeks.  On follow-up patient's blood pressure need to be reassessed.  Patient will need a repeat 2D echo limited to reevaluate pericardial effusion in 2 months. 2. Follow-up with Elby Showers, MD in 2 weeks.  On follow-up patient will need a basic metabolic profile done to follow-up on electrolytes and renal function.  Patient blood pressure need to be reassessed at that time.  Patient's diabetes will need to be followed up upon.   Discharge Diagnoses:  Active Problems:   Uncontrolled hypertension   Dependent edema   Controlled type 2 diabetes mellitus without complication (HCC)   Obesity   Hemispheric carotid artery syndrome   Dyspnea   Hx of transient ischemic attack (TIA)   Alcohol use   Elevated ALT measurement   Chest pain   SOB (shortness of breath)   Discharge Condition: Stable and improved  Diet recommendation: Carb modified  Filed Weights   11/26/17 1135 11/26/17 1832  Weight: 106.6 kg (235 lb) 106.8 kg (235 lb 7.2 oz)    History of present illness:  Per Dr Lilla Shook is a 52 y.o. male with medical history significant of hypertension, diabetes mellitus type 2, obesity, history of TIA, and other comorbidities who presented to [facility] emergency room with chief complaint of dyspnea on exertion at rest.  He initially noticed being short of breath with exertion for last few weeks but states is progressively gotten worse to being at rest. Patient also admitted to having some chest tightness with associated with this shortness of breath and states that it is very difficult for him to catch his breath.  Last night he became very dyspneic laying in bed and states that he cannot  lay flat and was uncomfortable and started coughing.  Then states he became so short of breath became apneic and states that he coughed so hard that it causes him to become lightheaded.  Denies any headaches, abdominal pain, rashes, nausea or vomiting.  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  [facility] was called to admit this patient for dyspnea on exertion and rest along with tightness of the chest intermittently.  ED Course: Basic blood work done as well as a chest x-ray.  Given 5 mg of IV hydralazine and 20 mg of IV labetalol for his blood pressure    Hospital Course:  1 shortness of breath likely secondary to hypertensive emergency Patient was admitted with shortness of breath felt likely secondary to hypertensive emergency.  Patient however with multiple risk factors and concern for cardiac etiology.  Cardiology consulted and followed the patient throughout the hospitalization.  Patient was placed on the Norvasc 10 mg daily, Avapro 630 mg daily, Bystolic 10 mg daily and hydralazine 10 mg 3 times daily added to patient's regimen with better blood pressure control.  Patient improved clinically did not have any further shortness of breath or chest tightness.  TSH which was done was within normal limits at 2.626.  Renal duplex which was done with preliminary readings negative for renal artery stenosis.  Aldosterone, plasma renin activity was pending at time of discharge.  Coronary CT which was done with  no hemodynamically significant coronary artery lesions noted.  Patient improved clinically and will follow up with cardiology in the outpatient setting.    2.  Small to moderate pericardial effusion Noted on 2D echo.  Patient denied any worsening of chest tightness in the supine position or improvement on leaning forward.  EKG did not have PR depression or diffuse ST elevation.  Coronary CT which was done on 11/29/2017 with no hemodynamically  significant coronary artery lesions.  Patient was placed on colchicine twice daily during the hospitalization and per cardiology recommended to continue for 3 months.  Patient also started on high-dose ibuprofen 600 mg 3 times daily x2 weeks for treatment of a presumed pericarditis with outpatient follow-up with cardiology and repeat 2D echo in 2 months to reevaluate pericardial effusion.  Outpatient follow-up.  3.  Hepatic steatosis Mild LFT abnormalities.  Likely Karlene Lineman.  Right upper quadrant ultrasound with evidence of hepatic steatosis.    Patient noted to have a LDL of 66 on the fasting lipid panel and started on low-dose time per cardiology recommendations.  Outpatient follow-up.  4.  Type 2 diabetes mellitus Hemoglobin A1c 7.5.    Patient's oral hypoglycemic agents were held during the hospitalization and patient maintained on sliding scale insulin.    5.  History of TIA Patient maintained on aspirin for secondary stroke prophylaxis.     Procedures:  Coronary CT 11/29/2017  Right upper quadrant ultrasound 11/26/2017  2D echo 11/27/2017  Chest x-ray 11/26/2017  Renal artery duplex 11/30/2017 preliminary readings with no evidence of renal artery stenosis.      Consultations:  Cardiology: Dr. Harrington Challenger 11/27/2017  Discharge Exam: Vitals:   11/29/17 2021 11/30/17 0421  BP: 125/81 (!) 136/92  Pulse: 67 66  Resp: 20 16  Temp: 98 F (36.7 C) 97.8 F (36.6 C)  SpO2: 98% 99%    General: NAD Cardiovascular: RRR Respiratory: CTAB  Discharge Instructions   Discharge Instructions    Diet Carb Modified   Complete by:  As directed    Increase activity slowly   Complete by:  As directed      Allergies as of 11/30/2017      Reactions   Oxycodone    Hyper, sensitive to this      Medication List    STOP taking these medications   furosemide 40 MG tablet Commonly known as:  LASIX   olmesartan 20 MG tablet Commonly known as:  BENICAR Replaced by:  irbesartan 300 MG  tablet   olmesartan 40 MG tablet Commonly known as:  BENICAR   sitaGLIPtin 100 MG tablet Commonly known as:  JANUVIA     TAKE these medications   amLODipine 10 MG tablet Commonly known as:  NORVASC Take 1 tablet (10 mg total) by mouth daily. What changed:    medication strength  how much to take   aspirin 81 MG EC tablet Take 1 tablet (81 mg total) by mouth daily. Start taking on:  12/01/2017 What changed:    medication strength  how much to take   atorvastatin 10 MG tablet Commonly known as:  LIPITOR Take 1 tablet (10 mg total) by mouth daily at 6 PM.   colchicine 0.6 MG tablet Take 1 tablet (0.6 mg total) by mouth 2 (two) times daily.   empagliflozin 25 MG Tabs tablet Commonly known as:  JARDIANCE Take 25 mg by mouth daily.   glucose blood test strip Commonly known as:  ONETOUCH VERIO Use as instructed to check blood sugar once  a day dx code E11.9   hydrALAZINE 10 MG tablet Commonly known as:  APRESOLINE Take 1 tablet (10 mg total) by mouth every 8 (eight) hours.   ibuprofen 600 MG tablet Commonly known as:  ADVIL,MOTRIN Take 1 tablet (600 mg total) by mouth 3 (three) times daily for 14 days.   irbesartan 300 MG tablet Commonly known as:  AVAPRO Take 1 tablet (300 mg total) by mouth daily. Start taking on:  12/01/2017 Replaces:  olmesartan 20 MG tablet   isosorbide mononitrate 30 MG 24 hr tablet Commonly known as:  IMDUR Take 0.5 tablets (15 mg total) by mouth daily. Start taking on:  12/01/2017   metFORMIN 500 MG 24 hr tablet Commonly known as:  GLUCOPHAGE-XR Take 4 tablets (2,000 mg total) by mouth daily with supper.   nebivolol 10 MG tablet Commonly known as:  BYSTOLIC Take 1 tablet (10 mg total) by mouth daily. What changed:    medication strength  See the new instructions.   ONETOUCH DELICA LANCETS 56O Misc Use to check blood sugar once a day dx code E11.9   pantoprazole 40 MG tablet Commonly known as:  PROTONIX Take 1 tablet (40 mg  total) by mouth daily. Start taking on:  12/01/2017   sildenafil 20 MG tablet Commonly known as:  REVATIO TAKE AS DIRECTED What changed:  when to take this      Allergies  Allergen Reactions  . Oxycodone     Hyper, sensitive to this   Follow-up Information    Fay Records, MD Follow up.   Specialty:  Cardiology Why:  our office will call you with a hospital follow-up visit in 2 weeks  Contact information: [place] [address] [phone]        Elby Showers, MD. Schedule an appointment as soon as possible for a visit in 2 week(s).   Specialty:  Internal Medicine Contact information: [address] [place] [address] [phone]            The results of significant diagnostics from this hospitalization (including imaging, microbiology, ancillary and laboratory) are listed below for reference.    Significant Diagnostic Studies: Dg Chest 2 View  Result Date: 11/26/2017 CLINICAL DATA:  SOB and cough today with chest tightness. Hx controlled HTN EXAM: CHEST - 2 VIEW COMPARISON:  09/02/2013 FINDINGS: Lungs are clear.  Relatively low lung volumes. Heart size and mediastinal contours are within normal limits. No effusion. Visualized bones unremarkable. IMPRESSION: No acute cardiopulmonary disease. Electronically Signed   By: Lucrezia Europe M.D.   On: 11/26/2017 12:02   Ct Coronary Morph W/cta Cor W/score W/ca W/cm &/or Wo/cm  Addendum Date: 11/29/2017   ADDENDUM REPORT: 11/29/2017 15:24 EXAM: OVER-READ INTERPRETATION  CT CHEST The following report is an over-read performed by radiologist Dr. Estanislado Pandy [facility] on 11/29/2017. This over-read does not include interpretation of cardiac or coronary anatomy or pathology. The coronary CTA interpretation by the cardiologist is attached. COMPARISON:  Chest radiograph 11/26/2017 FINDINGS: Normal caliber of the visualized thoracic aorta without dissection. Minimal pericardial  fluid. Central pulmonary arteries are patent. Incidentally, the patient has a left-sided SVC. A right-sided SVC is not identified on these images. Mildly prominent lymph node in the left paraesophageal region on sequence 11, image 27 measures 0.9 cm in the short axis and nonspecific. Overall, there is no significant lymph node enlargement. Markedly decreased attenuation of the liver and probably represents hepatic steatosis. Tiny nodular density in the  medial left lower lobe on sequence 12, image 33 is nonspecific but probably an incidental finding. Otherwise, the lungs are clear. No pleural effusions. No acute bone abnormality. IMPRESSION: Left-sided SVC. 4 mm nodule in the left lower lobe. No follow-up needed if patient is low-risk. Non-contrast chest CT can be considered in 12 months if patient is high-risk. This recommendation follows the consensus statement: Guidelines for Management of Incidental Pulmonary Nodules Detected on CT Images: From the Fleischner Society 2017; Radiology 2017; 284:228-243. Hepatic steatosis. Electronically Signed   By: Markus Daft M.D.   On: 11/29/2017 15:24   Result Date: 11/29/2017 CLINICAL DATA:  Chest pain EXAM: Cardiac CTA MEDICATIONS: Sub lingual nitro. 73m and lopressor 169mTECHNIQUE: The patient was scanned on a [id number] scanner. Gantry rotation speed was 250 msecs. Collimation was .6 mm. A 100 kV prospective scan was triggered in the ascending thoracic aorta at 140 HU's Full mA was used between 35% and 75% of the R-R interval. Average HR during the scan was 59 bpm. The 3D data set was interpreted on a dedicated work station using MPR, MIP and VRT modes. A total of 80 cc of contrast was used. FINDINGS: Non-cardiac: [facility]. No significant findings on limited lung and soft tissue windows. Calcium Score: Marked calcification of the proximal and mid LAD and scattered calcification of RCA and circumflex Coronary Arteries:  Right dominant with no anomalies LM: Less than 30% calcified disease in ostium LAD: 50-75% mixed plaque but heavily calcified in proximal and mid LAD D1: Small less than 30% mixed plaque proximally D2: 50-75% proximal stenosis calcified Circumflex: Less than 30% calcified stenosis in proximal and mid vessel OM1: Small and normal OM2: Less than 30% proximal calcific stenosis OM3: Normal RCA: Less than 30% calcified stenosis in the proximal mid and distal vessel PDA: Normal PLA: Normal IMPRESSION: 1. Calcium score 838 which is 9649h percentile for age and sex 2. 50-75% heavily calcified stenosis in the proximal/mid LAD and D2 study will be sent for FFR CT 3.  Normal aortic root 3.7 cm PeJenkins Rougelectronically Signed: By: PeJenkins Rouge.D. On: 11/29/2017 14:35   Ct Coronary Fractional Flow Reserve Fluid Analysis  Result Date: 11/30/2017 CLINICAL DATA:  CAD EXAM: FFR-CT TECHNIQUE: The best systolic and diastolic phases from the patients gated cardiac CTA were sent to [employer] for hemodynamic modeling CONTRAST:  None COMPARISON:  None FINDINGS: FFR-CT RCA:.95 LAD: Proximal .92 Mid .92 D1:.93 D2:.92 Circumflex:.93 IMPRESSION: No hemodynamically significant coronary artery lesions Electronically Signed   By: PeJenkins Rouge.D.   On: 11/30/2017 07:37   UsKoreabdomen Limited Ruq  Result Date: 11/26/2017 CLINICAL DATA:  Elevated liver enzymes EXAM: ULTRASOUND ABDOMEN LIMITED RIGHT UPPER QUADRANT COMPARISON:  None. FINDINGS: Gallbladder: No gallstones or wall thickening visualized. There is no pericholecystic fluid. No sonographic Murphy sign noted by sonographer. Common bile duct: Diameter: 7 mm, upper normal. Note portions of the common bile duct are obscured by gas. Liver: No focal lesion identified. Liver echogenicity is increased markedly throughout the liver. Portal vein is patent on color Doppler imaging with normal direction of blood flow towards the liver. IMPRESSION: 1. Marked increase in liver echogenicity  diffusely, a finding indicative of hepatic steatosis. While no focal liver lesions are evident on this study, it must be cautioned that the sensitivity of ultrasound for detection focal liver lesions is diminished significantly in this circumstance. 2. Only a portion of the common bile duct could be  visualized. Visualized portion is upper normal in size at 7 mm. 3.  No evident gallbladder pathology. Electronically Signed   By: Lowella Grip III M.D.   On: 11/26/2017 17:56    Microbiology: No results found for this or any previous visit (from the past 240 hour(s)).   Labs: Basic Metabolic Panel: Recent Labs  Lab 11/27/17 0036 11/27/17 1316 11/28/17 0430 11/29/17 0844 11/30/17 0502  NA 138 138 134* 135 138  K 3.1* 4.3 3.7 4.7 4.1  CL 101 102 101 103 106  CO2 26 25 23 25 26   GLUCOSE 209* 194* 167* 251* 201*  BUN 11 12 14 18 18   CREATININE 0.95 0.94 1.01 1.01 0.87  CALCIUM 9.1 9.3 9.0 9.2 8.8*  MG 1.5* 2.1 2.1 1.9  --   PHOS 2.7  --   --   --   --    Liver Function Tests: Recent Labs  Lab 11/26/17 1150 11/27/17 0036 11/28/17 0430 11/29/17 0844  AST 41 35 36 71*  ALT 74* 65* 62* 98*  ALKPHOS 39 39 42 42  BILITOT 0.9 1.4* 1.4* 2.1*  PROT 7.1 6.9 7.1 6.8  ALBUMIN 4.0 3.9 3.7 3.8   No results for input(s): LIPASE, AMYLASE in the last 168 hours. No results for input(s): AMMONIA in the last 168 hours. CBC: Recent Labs  Lab 11/26/17 1150 11/27/17 0036 11/28/17 0430 11/29/17 0844  WBC 3.9* 5.4 4.3 4.0  NEUTROABS 2.5 3.2  --  2.2  HGB 15.9 15.7 15.7 16.1  HCT 45.8 44.8 44.7 45.5  MCV 98.9 96.8 98.0 97.6  PLT 185 197 197 197   Cardiac Enzymes: Recent Labs  Lab 11/26/17 1853 11/26/17 2112 11/27/17 0036  TROPONINI <0.03 <0.03 <0.03   BNP: BNP (last 3 results) Recent Labs    11/26/17 1441  BNP 117.9*    ProBNP (last 3 results) No results for input(s): PROBNP in the last 8760 hours.  CBG: Recent Labs  Lab 11/29/17 1214 11/29/17 1631 11/29/17 2113  11/30/17 0741 11/30/17 1200  GLUCAP 134* 211* 212* 169* 137*       Signed:  Irine Seal MD.  [facility] 11/30/2017, 2:08 PM

## 2017-11-30 NOTE — Progress Notes (Signed)
Progress Note  Patient Name: Kyle Dawson Date of Encounter: 11/30/2017  Primary Cardiologist: Dorris Carnes, MD   Subjective   No cardiac complaints. Denies CP and dyspnea.   Inpatient Medications    Scheduled Meds: . amLODipine  10 mg Oral Daily  . aspirin  81 mg Oral Daily  . colchicine  0.6 mg Oral BID  . enoxaparin (LOVENOX) injection  40 mg Subcutaneous Q24H  . hydrALAZINE  10 mg Oral Q8H  . insulin aspart  0-15 Units Subcutaneous TID WC  . insulin aspart  0-5 Units Subcutaneous QHS  . irbesartan  300 mg Oral Daily  . nebivolol  10 mg Oral Daily  . pantoprazole  40 mg Oral Daily   Continuous Infusions:  PRN Meds: acetaminophen, ALPRAZolam, gi cocktail, hydrALAZINE, metoprolol tartrate, ondansetron (ZOFRAN) IV   Vital Signs    Vitals:   11/29/17 1436 11/29/17 1500 11/29/17 2021 11/30/17 0421  BP: (!) 142/91 133/82 125/81 (!) 136/92  Pulse: (!) 58  67 66  Resp: 18  20 16   Temp: 97.7 F (36.5 C)  98 F (36.7 C) 97.8 F (36.6 C)  TempSrc: Oral  Oral Oral  SpO2: 98%  98% 99%  Weight:      Height:        Intake/Output Summary (Last 24 hours) at 11/30/2017 1007 Last data filed at 11/30/2017 0600 Gross per 24 hour  Intake 240 ml  Output 1025 ml  Net -785 ml   Filed Weights   11/26/17 1135 11/26/17 1832  Weight: 235 lb (106.6 kg) 235 lb 7.2 oz (106.8 kg)    Telemetry    NSR, 60s - Personally Reviewed  ECG    Not performed today - Personally Reviewed  Physical Exam   GEN: moderartely obese WM in No acute distress.   Neck: No JVD Cardiac: RRR, no murmurs, rubs, or gallops.  Respiratory: Clear to auscultation bilaterally. GI: Soft, nontender, non-distended  MS: No edema; No deformity. Neuro:  Nonfocal  Psych: Normal affect   Labs    Chemistry Recent Labs  Lab 11/27/17 0036  11/28/17 0430 11/29/17 0844 11/30/17 0502  NA 138   < > 134* 135 138  K 3.1*   < > 3.7 4.7 4.1  CL 101   < > 101 103 106  CO2 26   < > 23 25 26   GLUCOSE 209*    < > 167* 251* 201*  BUN 11   < > 14 18 18   CREATININE 0.95   < > 1.01 1.01 0.87  CALCIUM 9.1   < > 9.0 9.2 8.8*  PROT 6.9  --  7.1 6.8  --   ALBUMIN 3.9  --  3.7 3.8  --   AST 35  --  36 71*  --   ALT 65*  --  62* 98*  --   ALKPHOS 39  --  42 42  --   BILITOT 1.4*  --  1.4* 2.1*  --   GFRNONAA >60   < > >60 >60 >60  GFRAA >60   < > >60 >60 >60  ANIONGAP 11   < > 10 7 6    < > = values in this interval not displayed.     Hematology Recent Labs  Lab 11/27/17 0036 11/28/17 0430 11/29/17 0844  WBC 5.4 4.3 4.0  RBC 4.63 4.56 4.66  HGB 15.7 15.7 16.1  HCT 44.8 44.7 45.5  MCV 96.8 98.0 97.6  MCH 33.9 34.4* 34.5*  MCHC  35.0 35.1 35.4  RDW 13.2 13.3 13.0  PLT 197 197 197    Cardiac Enzymes Recent Labs  Lab 11/26/17 1853 11/26/17 2112 11/27/17 0036  TROPONINI <0.03 <0.03 <0.03    Recent Labs  Lab 11/26/17 1224  TROPIPOC 0.00     BNP Recent Labs  Lab 11/26/17 1441  BNP 117.9*     DDimer  Recent Labs  Lab 11/26/17 1150  DDIMER 0.49     Radiology    Ct Coronary Morph W/cta Cor W/score W/ca W/cm &/or Wo/cm  Addendum Date: 11/29/2017   ADDENDUM REPORT: 11/29/2017 15:24 EXAM: OVER-READ INTERPRETATION  CT CHEST The following report is an over-read performed by radiologist Dr. Estanislado Pandy [facility] on 11/29/2017. This over-read does not include interpretation of cardiac or coronary anatomy or pathology. The coronary CTA interpretation by the cardiologist is attached. COMPARISON:  Chest radiograph 11/26/2017 FINDINGS: Normal caliber of the visualized thoracic aorta without dissection. Minimal pericardial fluid. Central pulmonary arteries are patent. Incidentally, the patient has a left-sided SVC. A right-sided SVC is not identified on these images. Mildly prominent lymph node in the left paraesophageal region on sequence 11, image 27 measures 0.9 cm in the short axis and nonspecific. Overall, there is no significant lymph node enlargement. Markedly decreased  attenuation of the liver and probably represents hepatic steatosis. Tiny nodular density in the medial left lower lobe on sequence 12, image 33 is nonspecific but probably an incidental finding. Otherwise, the lungs are clear. No pleural effusions. No acute bone abnormality. IMPRESSION: Left-sided SVC. 4 mm nodule in the left lower lobe. No follow-up needed if patient is low-risk. Non-contrast chest CT can be considered in 12 months if patient is high-risk. This recommendation follows the consensus statement: Guidelines for Management of Incidental Pulmonary Nodules Detected on CT Images: From the Fleischner Society 2017; Radiology 2017; 284:228-243. Hepatic steatosis. Electronically Signed   By: Markus Daft M.D.   On: 11/29/2017 15:24   Result Date: 11/29/2017 CLINICAL DATA:  Chest pain EXAM: Cardiac CTA MEDICATIONS: Sub lingual nitro. 76m and lopressor 168mTECHNIQUE: The patient was scanned on a [id number] scanner. Gantry rotation speed was 250 msecs. Collimation was .6 mm. A 100 kV prospective scan was triggered in the ascending thoracic aorta at 140 HU's Full mA was used between 35% and 75% of the R-R interval. Average HR during the scan was 59 bpm. The 3D data set was interpreted on a dedicated work station using MPR, MIP and VRT modes. A total of 80 cc of contrast was used. FINDINGS: Non-cardiac: [facility] No significant findings on limited lung and soft tissue windows. Calcium Score: Marked calcification of the proximal and mid LAD and scattered calcification of RCA and circumflex Coronary Arteries: Right dominant with no anomalies LM: Less than 30% calcified disease in ostium LAD: 50-75% mixed plaque but heavily calcified in proximal and mid LAD D1: Small less than 30% mixed plaque proximally D2: 50-75% proximal stenosis calcified Circumflex: Less than 30% calcified stenosis in proximal and mid vessel OM1: Small and normal OM2: Less than 30% proximal  calcific stenosis OM3: Normal RCA: Less than 30% calcified stenosis in the proximal mid and distal vessel PDA: Normal PLA: Normal IMPRESSION: 1. Calcium score 838 which is 9636h percentile for age and sex 2. 50-75% heavily calcified stenosis in the proximal/mid LAD and D2 study will be sent for FFR CT 3.  Normal aortic root 3.7 cm PeJenkins Rougelectronically Signed: By: PeCollier Salina  Johnsie Cancel M.D. On: 11/29/2017 14:35   Ct Coronary Fractional Flow Reserve Fluid Analysis  Result Date: 11/30/2017 CLINICAL DATA:  CAD EXAM: FFR-CT TECHNIQUE: The best systolic and diastolic phases from the patients gated cardiac CTA were sent to HeartFlow for hemodynamic modeling CONTRAST:  None COMPARISON:  None FINDINGS: FFR-CT RCA:.95 LAD: Proximal .92 Mid .92 D1:.93 D2:.92 Circumflex:.93 IMPRESSION: No hemodynamically significant coronary artery lesions Electronically Signed   By: Jenkins Rouge M.D.   On: 11/30/2017 07:37    Cardiac Studies   Coronary CTA 11/29/17 FINDINGS: FFR-CT  RCA:.95  LAD: Proximal .92 Mid .92  D1:.93  D2:.92  Circumflex:.93  IMPRESSION: No hemodynamically significant coronary artery lesions  2D Echo 11/27/17 Study Conclusions  - Left ventricle: The cavity size was normal. Wall thickness was   increased in a pattern of moderate LVH. Systolic function was   normal. The estimated ejection fraction was in the range of 55%   to 60%. Wall motion was normal; there were no regional wall   motion abnormalities. - Aortic valve: Valve area (Vmax): 2.64 cm^2. - Pericardium, extracardiac: A small to moderate pericardial   effusion was identified circumferential to the heart.  Preliminary Renal Artery Doppler Report 11/30/17 (official report pending) Vascular prelim--- Renal artery duplex completed: no evidence of renal artery stenosis.   Patient Profile     JAHDIEL KROL a 52 y.o.male, works as a RN,with a history of HTN, DM, TIAand family h/o heart disease, who is being  seen today for the evaluation ofdyspnea and chst tightnessat the request of Dr. Alfredia Ferguson.  Assessment & Plan    1. HTN:  Continues to have labile BPs. BP earlier this morning was recorded at 136/92 but a reading of 170/105 was also documented.  Echo with moderate LVH but normal LVEF. Aldosterone + renin ratio lab still pending. Preliminary report of renal dopplers noted no renal artery stenosis, still awaiting official report.  Currently on amlodipine 10, irbesartan 30, hydralazine 10 mg TID and nebivolol 5 mg. SCr normal at 0.87. Given fluctuating BPs, ? Possible component of white coat HTN. May consider outpatient ambulatory monitor x 24 hr to assess BP trend outside of hospital setting.   2. Dyspnea/Chest Pain:  ruled out for MI with negative cardiac enzymes x 3. CXR negative for acute cardiopulmonary disease. D-dimer negative. Coronary CTA yesterday showed no hemodynamically significant coronary artery lesions. BNP only mildly elevated at 117. Lasix was given. Echo showed normal LVEF and wall motion with moderate LVH and no valvular abnormalities. A mild to moderate pericardial effusion was noted. ? Pericarditis. This may be the cause of his symptoms. Colchicine 0.6 mg BID has already been initiated and we will continue x 3 months. Now that obstructive CAD has been r/o, we will stop ASA to allow addition of high dose NSAID therapy. Recommend ibuprofen 800 mg TID with meals x 2 weeks. Recommend adding a PPI x 2 weeks for GI protection. Repeat echo in 2 to 3 months to see if effusion has resolved.  3. Pericardial Effusion:  A mild to moderate pericardial effusion was noted. ? Pericarditis. Colchicine 0.6 mg BID has already been initiated and we will continue x 3 months. Now that obstructive CAD has been r/o, we will stop ASA to allow addition of high dose NSAID therapy. Recommend ibuprofen 800 mg TID with meals x 2 weeks. Recommend adding a PPI x 2 weeks for GI protection. Repeat echo in 2 to 3 months to  see if effusion has resolved.  For questions or updates, please contact [place] Please consult [email or web address] for contact info under Cardiology/STEMI.      Signed, Lyda Jester, PA-C  11/30/2017, 10:07 AM

## 2017-12-02 LAB — ALDOSTERONE + RENIN ACTIVITY W/ RATIO
ALDO / PRA Ratio: 1.6 (ref 0.0–30.0)
Aldosterone: 1.6 ng/dL (ref 0.0–30.0)
PRA LC/MS/MS: 1.007 ng/mL/hr (ref 0.167–5.380)

## 2017-12-02 NOTE — Progress Notes (Signed)
Was notified from [facility] office that patient's pharmacy had called stating that patient was on sildenafil.  Patient had been prescribed imdur on discharge and due to interaction between sildenafil and imdur, it was relayed to pharmacy not to fill prescription for imdur.  Cardiology notified via phone call with Dr. Radford Pax.  No charge.

## 2017-12-09 ENCOUNTER — Ambulatory Visit: Payer: BC Managed Care – PPO | Admitting: Internal Medicine

## 2017-12-09 ENCOUNTER — Ambulatory Visit: Payer: BC Managed Care – PPO | Admitting: Physician Assistant

## 2017-12-09 ENCOUNTER — Encounter: Payer: Self-pay | Admitting: Physician Assistant

## 2017-12-09 VITALS — BP 164/98 | HR 84 | Ht 71.0 in | Wt 234.0 lb

## 2017-12-09 DIAGNOSIS — I2510 Atherosclerotic heart disease of native coronary artery without angina pectoris: Secondary | ICD-10-CM | POA: Diagnosis not present

## 2017-12-09 DIAGNOSIS — R400 Somnolence: Secondary | ICD-10-CM

## 2017-12-09 DIAGNOSIS — R0683 Snoring: Secondary | ICD-10-CM

## 2017-12-09 DIAGNOSIS — I313 Pericardial effusion (noninflammatory): Secondary | ICD-10-CM | POA: Diagnosis not present

## 2017-12-09 DIAGNOSIS — E119 Type 2 diabetes mellitus without complications: Secondary | ICD-10-CM

## 2017-12-09 DIAGNOSIS — I3139 Other pericardial effusion (noninflammatory): Secondary | ICD-10-CM

## 2017-12-09 DIAGNOSIS — I10 Essential (primary) hypertension: Secondary | ICD-10-CM

## 2017-12-09 MED ORDER — FUROSEMIDE 20 MG PO TABS: ORAL_TABLET | ORAL | 2 refills | Status: DC

## 2017-12-09 MED ORDER — HYDRALAZINE HCL 25 MG PO TABS: 25.0000 mg | ORAL_TABLET | Freq: Three times a day (TID) | ORAL | 6 refills | Status: DC

## 2017-12-09 MED FILL — FUROSEMIDE 20 MG TABS: 20 | 30 days supply | Qty: 30 | Fill #0

## 2017-12-09 MED FILL — hydrALAZINE HCL 25 MG TABS: 25 | 30 days supply | Qty: 90 | Fill #0

## 2017-12-09 NOTE — Patient Instructions (Addendum)
Medication Instructions:  START Hydralazine 64m Take 1 tablet three times a day START Lasix 268mTake on a as needed basis for weight gain of 3lbs in a day or 5lbs in a week STOP Ibuprofen on next Wednesday  Labwork: None   Testing/Procedures: Your physician has requested that you have an echocardiogram. Echocardiography is a painless test that uses sound waves to create images of your heart. It provides your doctor with information about the size and shape of your heart and how well your heart's chambers and valves are working. This procedure takes approximately one hour. There are no restrictions for this procedure. SCHEDULE IN 2 MONTHS-ORDER IN CHART PLEASE SCHEDULE [address]  Your physician has recommended that you have a sleep study. This test records several body functions during sleep, including: brain activity, eye movement, oxygen and carbon dioxide blood levels, heart rate and rhythm, breathing rate and rhythm, the flow of air through your mouth and nose, snoring, body muscle movements, and chest and belly movement. OUR SLEEP COORDINATOR WILL CONTACT YOU  Follow-Up: Your physician recommends that you schedule a follow-up appointment in: 3 [place] 2-3 weeks with Hypertension Clinic   Any Other Special Instructions Will Be Listed Below (If Applicable). Work Note give If you need a refill on your cardiac medications before your next appointment, please call your pharmacy.

## 2017-12-09 NOTE — Progress Notes (Signed)
Cardiology Office Note    Date:  12/11/2017   ID:  Kyle Dawson, DOB 03/29/1966, MRN [id number]  PCP:  Elby Showers, MD  Cardiologist:  Dr. Harrington Challenger  Chief Complaint  Patient presents with  . Follow-up    recent PCI  . Edema  . Headache  . Shortness of Breath    History of Present Illness:  Kyle Dawson is a 52 y.o. male with PMH of HTN, DM and TIA.  He recently presented to the hospital on 11/16/2017 with chest pain and dyspnea. Initial blood pressure was elevated.  Troponin was negative.  Echocardiogram obtained on 11/27/2017 showed EF 55 to 60%, moderate LVH, small to moderate pericardial effusion was identified circumferential to the heart.  Initial suspicion for chest pain was underlying esophageal spasm with GERD, the characteristic of the chest discomfort is very atypical.  Despite the circumferential pericardial effusion that was noted on echocardiogram, his symptom was not pleuritic in nature nor was it worse with lying in supine.  Coronary CT showed nonobstructive CAD with 50 to 75% proximal to mid LAD lesion involving D2 with negative FFR.  Aggressive risk factor modification was recommended.  Bystolic was increased to 10 mg daily.  Statin therapy was initiated during this admission as well given the coronary artery disease that was noted.  Prior to discharge, 15 mg Imdur was also added as well.  As for the pericardial effusion, he was treated with Motrin 600 mg 3 times daily for 2 weeks and colchicine 0.6 mg twice daily for total 67-month  It was recommended for the patient to have a repeat echocardiogram in 2 months to reevaluate pericardial effusion.  Renal artery ultrasound obtained on 11/30/2017 showed a normal renal artery.  Patient presents today for cardiology office visit.  He continued to have intermittent shortness of breath, however no significant chest pain.  His blood pressure is elevated today 169/98.  On manual recheck by myself, the blood pressure was 158/88.  I  will increase hydralazine to 25 mg 3 times daily.  He does have trace amount of edema in bilateral lower extremity, however I do not think he necessarily need a scheduled diuretic.  I gave him a prescription for as needed dose of Lasix.  He says Imdur was removed after his pharmacist called regarding the interaction with sildenafil.  The elevation of the blood pressure likely due to combination of NSAID use and obstructive sleep apnea.  He will finish the two-week prescription of ibuprofen then stopped next week.  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  This likely is the main cause for his uncontrolled hypertension.  I will order a sleep study for him.  He can follow-up with the hypertension clinic in 2 to 3 weeks for medication adjustment.  He will need a 266-monthchocardiogram to reassess the pericardial effusion and follow-up with Dr. RoHarrington Challengern 3 months.  I have cleared him to go back to the work next week on August 9, by which point he has been out of the hospital for 2 weeks and should be able to handle the extreme workload demanded in the emergency department.  He will let usKoreanow if he does not think he can keep up with the workload after going back.  I did tell him that the recent coronary CT showed a 4 mm left lower lobe lung nodule, this will need to  be followed by primary care provider.   Past Medical History:  Diagnosis Date  . Basal cell carcinoma   . Chest pain   . Diabetes mellitus without complication ([place])   . Edema    lower extremity  . Hypertension   . Squamous cell carcinoma    R ear, nose, each side of face    Past Surgical History:  Procedure Laterality Date  . Removal basal cell carcinoma    . Removal squamous cell carcinoma      Current Medications: Outpatient Medications Prior to Visit  Medication Sig Dispense Refill  . amLODipine (NORVASC) 10 MG tablet Take 1 tablet (10 mg  total) by mouth daily. 30 tablet 0  . aspirin EC 81 MG EC tablet Take 1 tablet (81 mg total) by mouth daily.    Marland Kitchen atorvastatin (LIPITOR) 10 MG tablet Take 1 tablet (10 mg total) by mouth daily at 6 PM. 30 tablet 0  . colchicine 0.6 MG tablet Take 1 tablet (0.6 mg total) by mouth 2 (two) times daily. 60 tablet 2  . empagliflozin (JARDIANCE) 25 MG TABS tablet Take 25 mg by mouth daily. 30 tablet 3  . glucose blood (ONETOUCH VERIO) test strip Use as instructed to check blood sugar once a day dx code E11.9 100 each 1  . ibuprofen (ADVIL,MOTRIN) 600 MG tablet Take 1 tablet (600 mg total) by mouth 3 (three) times daily for 14 days. 42 tablet 0  . irbesartan (AVAPRO) 300 MG tablet Take 1 tablet (300 mg total) by mouth daily. 30 tablet 0  . metFORMIN (GLUCOPHAGE-XR) 500 MG 24 hr tablet Take 4 tablets (2,000 mg total) by mouth daily with supper. 120 tablet 3  . nebivolol (BYSTOLIC) 10 MG tablet Take 1 tablet (10 mg total) by mouth daily. 30 tablet 0  . ONETOUCH DELICA LANCETS 02D MISC Use to check blood sugar once a day dx code E11.9 100 each 1  . pantoprazole (PROTONIX) 40 MG tablet Take 1 tablet (40 mg total) by mouth daily. 30 tablet 2  . sildenafil (REVATIO) 20 MG tablet TAKE AS DIRECTED (Patient taking differently: Take 1 tablet by mouth daily as needed for ed) 60 tablet 1  . hydrALAZINE (APRESOLINE) 10 MG tablet Take 1 tablet (10 mg total) by mouth every 8 (eight) hours. 90 tablet 0  . isosorbide mononitrate (IMDUR) 30 MG 24 hr tablet Take 0.5 tablets (15 mg total) by mouth daily. 30 tablet 0   No facility-administered medications prior to visit.      Allergies:   Oxycodone   Social History   Socioeconomic History  . Marital status: Single    Spouse name: Not on file  . Number of children: 0  . Years of education: ADN  . Highest education level: Not on file  Occupational History  . Occupation: ER [employer]    Employer: [employer]  . Occupation: [employer]  Social Needs  . Financial resource  strain: Not on file  . Food insecurity:    Worry: Not on file    Inability: Not on file  . Transportation needs:    Medical: Not on file    Non-medical: Not on file  Tobacco Use  . Smoking status: Former Research scientist (life sciences)  . Smokeless tobacco: Never Used  Substance and Sexual Activity  . Alcohol use: Yes  . Drug use: No  . Sexual activity: Not on file  Lifestyle  . Physical activity:    Days per week: Not on file    Minutes  per session: Not on file  . Stress: Not on file  Relationships  . Social connections:    Talks on phone: Not on file    Gets together: Not on file    Attends religious service: Not on file    Active member of club or organization: Not on file    Attends meetings of clubs or organizations: Not on file    Relationship status: Not on file  Other Topics Concern  . Not on file  Social History Narrative   Patient drinks 1 cup of coffee a day      Family History:  The patient's family history includes CAD in his maternal grandfather; Heart failure in his mother and paternal grandfather; Hypertension in his father and mother; Peripheral vascular disease (age of onset: 31) in his mother.   ROS:   Please see the history of present illness.    ROS All other systems reviewed and are negative.   PHYSICAL EXAM:   VS:  BP (!) 164/98 (BP Location: Left Arm, Patient Position: Sitting, Cuff Size: Large)   Pulse 84   Ht 5' 11"  (1.803 m)   Wt 234 lb (106.1 kg)   BMI 32.64 kg/m    GEN: Well nourished, well developed, in no acute distress  HEENT: normal  Neck: no JVD, carotid bruits, or masses Cardiac: RRR; no murmurs, rubs, or gallops,no edema  Respiratory:  clear to auscultation bilaterally, normal work of breathing GI: soft, nontender, nondistended, + BS MS: no deformity or atrophy  Skin: warm and dry, no rash Neuro:  Alert and Oriented x 3, Strength and sensation are intact Psych: euthymic mood, full affect  Wt Readings from Last 3 Encounters:  12/09/17 234 lb (106.1  kg)  11/26/17 235 lb 7.2 oz (106.8 kg)  03/08/17 228 lb (103.4 kg)      Studies/Labs Reviewed:   EKG:  EKG is not ordered today.   Recent Labs: 11/26/2017: B Natriuretic Peptide 117.9 11/28/2017: TSH 2.626 11/29/2017: ALT 98; Hemoglobin 16.1; Magnesium 1.9; Platelets 197 11/30/2017: BUN 18; Creatinine, Ser 0.87; Potassium 4.1; Sodium 138   Lipid Panel    Component Value Date/Time   CHOL 144 11/30/2017 0502   TRIG 107 11/30/2017 0502   HDL 57 11/30/2017 0502   CHOLHDL 2.5 11/30/2017 0502   VLDL 21 11/30/2017 0502   LDLCALC 66 11/30/2017 0502    Additional studies/ records that were reviewed today include:   Echo 11/27/2017 LV EF: 55% -   60% Study Conclusions  - Left ventricle: The cavity size was normal. Wall thickness was   increased in a pattern of moderate LVH. Systolic function was   normal. The estimated ejection fraction was in the range of 55%   to 60%. Wall motion was normal; there were no regional wall   motion abnormalities. - Aortic valve: Valve area (Vmax): 2.64 cm^2. - Pericardium, extracardiac: A small to moderate pericardial   effusion was identified circumferential to the heart.   Coronary CT 11/29/2017 1. Calcium score 838 which is 37 th percentile for age and sex  2. 50-75% heavily calcified stenosis in the proximal/mid LAD and D2 study will be sent for FFR CT  3.  Normal aortic root 3.7 cm   Renal artery [place] 11/30/2017 FINAL INTERPRETATION: Renal:  Right: Normal right Resisitive Index. No evidence of right renal    artery stenosis. Left: Normal left Resistive Index. No evidence of left renal artery    stenosis.   ASSESSMENT:    1.  Coronary artery disease involving native coronary artery of native heart without angina pectoris   2. Snores   3. Daytime sleepiness   4. Pericardial effusion   5. Essential hypertension   6. Controlled type 2 diabetes mellitus without complication, without long-term current use of insulin (HCC)        PLAN:  In order of problems listed above:  1. CAD: Nonobstructive disease on recent cardiac catheterization, FFR was negative.  Continue aspirin and statin  2. Daytime somnolence with snoring: Likely has obstructive sleep apnea.  Will arrange for sleep study as outpatient  3. Pericardial effusion: We will need to repeat echocardiogram in 2 months  4. Hypertension: Blood pressure elevated, increase hydralazine to 25 mg 3 times daily.  Likely due to combination of recent NSAID use and underlying obstructive sleep apnea  5. DM2: Managed by primary care provider.  Continue metformin.    Medication Adjustments/Labs and Tests Ordered: Current medicines are reviewed at length with the patient today.  Concerns regarding medicines are outlined above.  Medication changes, Labs and Tests ordered today are listed in the Patient Instructions below. Patient Instructions  Medication Instructions:  START Hydralazine 75m Take 1 tablet three times a day START Lasix 224mTake on a as needed basis for weight gain of 3lbs in a day or 5lbs in a week STOP Ibuprofen on next Wednesday  Labwork: None   Testing/Procedures: Your physician has requested that you have an echocardiogram. Echocardiography is a painless test that uses sound waves to create images of your heart. It provides your doctor with information about the size and shape of your heart and how well your heart's chambers and valves are working. This procedure takes approximately one hour. There are no restrictions for this procedure. SCHEDULE IN 2 MONTHS-ORDER IN CHART PLEASE SCHEDULE [address]  Your physician has recommended that you have a sleep study. This test records several body functions during sleep, including: brain activity, eye movement, oxygen and carbon dioxide blood levels, heart rate and rhythm, breathing rate and rhythm, the flow of air through your mouth and nose, snoring, body muscle movements, and  chest and belly movement. OUR SLEEP COORDINATOR WILL CONTACT YOU  Follow-Up: Your physician recommends that you schedule a follow-up appointment in: 3 [place] 2-3 weeks with Hypertension Clinic   Any Other Special Instructions Will Be Listed Below (If Applicable). Work Note give If you need a refill on your cardiac medications before your next appointment, please call your pharmacy.     SiHilbert CorriganPAUtah8/08/2017 11:07 PM    [facility] [address] [place]  [id number]: [phone]: [phone]

## 2017-12-11 ENCOUNTER — Encounter: Payer: Self-pay | Admitting: Physician Assistant

## 2017-12-20 ENCOUNTER — Ambulatory Visit: Payer: BC Managed Care – PPO | Admitting: Cardiology

## 2017-12-23 ENCOUNTER — Encounter: Payer: Self-pay | Admitting: Pharmacist

## 2017-12-23 ENCOUNTER — Ambulatory Visit (INDEPENDENT_AMBULATORY_CARE_PROVIDER_SITE_OTHER): Payer: BC Managed Care – PPO | Admitting: Pharmacist

## 2017-12-23 VITALS — BP 122/80 | HR 66

## 2017-12-23 DIAGNOSIS — I10 Essential (primary) hypertension: Secondary | ICD-10-CM

## 2017-12-23 MED ORDER — NEBIVOLOL HCL 20 MG PO TABS: 20.0000 mg | ORAL_TABLET | Freq: Every day | ORAL | 3 refills | Status: DC

## 2017-12-23 NOTE — Progress Notes (Signed)
Patient ID: Kyle Dawson                 DOB: 09-11-1965                      MRN: [id number]     HPI: Kyle Dawson is a 52 y.o. male patient of Dr. Harrington Challenger who presents today for hypertension evaluation. PMH significant for HTN, DM and TIA.  He recently presented to the hospital on 11/26/2017 with chest pain and dyspnea secondary to hypertensive emergency. Initial blood pressure was elevated. At his most recent visit with Almyra Deforest, PA, his hydralazine was increased to 9m TID. He was also started on furosemide as needed for weight gain. [facility]/98. His hydralazine was increased from 10 mg TID to 25 mg TID at this visit.  He presents today for BP management. He has noticed slightly more fluid than normal as he was on furosemide 40 mg before. After amlodipine was increased to 10 mg, his swelling has come and gone. Fluid is mostly hands/feet, and ankles. He takes his furosemide about once a week but has not taken it in the past 8 days. He does not weigh himself daily. 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 Normally wakes up at 4 pm when he is working. He is having a problem timing his medications due to having night shifts only some days per week and has missed doses because of this. Since the hospitalization he has endorsed feeling tired but denies dizziness and chest pain. He has had some episodes of needing to catch his breath while sitting.  Current HTN meds:  Amlodipine 188mdaily Furosemide 2035mRN Hydralazine 57m75mD Irbesartan 300m038UEly Bystolic 10mg28MKly   Previously tried: benazepril 40 mg, olmesartan 40 mg, isosorbide 30 mg- discontinued due to interaction with sildenafil.  BP goal: <130/80  Family History: CAD in his maternal grandfather; Heart failure in his mother and paternal grandfather; Hypertension in his father and mother; Peripheral vascular disease (age of onset: 69) 27 his mother.  Social  History: former smoker, does drink alcohol (6-8 drinks a week on work days)  Diet: half of meals are from home, and half meals are out (at work). Has salad, wrap, or soup at work. Cooks at home, eats turkKu[place]icken, fish, and a vegetable. Trying 80% vegetables, 20% animal protein. Only adds salt when cooking. Drinks water, drinks 3-4 cups of black coffee per week.   Exercise: walks on treadmill for 1 hour on 4 days a week. Does about 6,000-7,000 steps at work.   Home BP readings: has home arm cuff about a year old. Most readings in 140-150s for SBP and 80s for DBP.   Wt Readings from Last 3 Encounters:  12/09/17 234 lb (106.1 kg)  11/26/17 235 lb 7.2 oz (106.8 kg)  03/08/17 228 lb (103.4 kg)   BP Readings from Last 3 Encounters:  12/23/17 122/80  12/09/17 (!) 164/98  11/30/17 132/86   Pulse Readings from Last 3 Encounters:  12/23/17 66  12/09/17 84  11/30/17 (!) 58    Renal function: CrCl cannot be calculated (Patient's most recent lab result is older than the maximum 21 days allowed.).  Past Medical History:  Diagnosis Date  . Basal cell carcinoma   . Chest pain   . Diabetes mellitus without complication (HCC)[place]. Edema  lower extremity  . Hypertension   . Squamous cell carcinoma    R ear, nose, each side of face    Current Outpatient Medications on File Prior to Visit  Medication Sig Dispense Refill  . amLODipine (NORVASC) 10 MG tablet Take 1 tablet (10 mg total) by mouth daily. 30 tablet 0  . aspirin EC 81 MG EC tablet Take 1 tablet (81 mg total) by mouth daily.    Marland Kitchen atorvastatin (LIPITOR) 10 MG tablet Take 1 tablet (10 mg total) by mouth daily at 6 PM. 30 tablet 0  . colchicine 0.6 MG tablet Take 1 tablet (0.6 mg total) by mouth 2 (two) times daily. 60 tablet 2  . empagliflozin (JARDIANCE) 25 MG TABS tablet Take 25 mg by mouth daily. 30 tablet 3  . furosemide (LASIX) 20 MG tablet Take on a as needed basis if wt 3lb a day or 5lb a week weight gain 30 tablet 2  .  glucose blood (ONETOUCH VERIO) test strip Use as instructed to check blood sugar once a day dx code E11.9 100 each 1  . irbesartan (AVAPRO) 300 MG tablet Take 1 tablet (300 mg total) by mouth daily. 30 tablet 0  . metFORMIN (GLUCOPHAGE-XR) 500 MG 24 hr tablet Take 4 tablets (2,000 mg total) by mouth daily with supper. 120 tablet 3  . ONETOUCH DELICA LANCETS 50P MISC Use to check blood sugar once a day dx code E11.9 100 each 1  . pantoprazole (PROTONIX) 40 MG tablet Take 1 tablet (40 mg total) by mouth daily. 30 tablet 2  . sildenafil (REVATIO) 20 MG tablet TAKE AS DIRECTED (Patient taking differently: Take 1 tablet by mouth daily as needed for ed) 60 tablet 1   No current facility-administered medications on file prior to visit.     Allergies  Allergen Reactions  . Oxycodone     Hyper, sensitive to this    Blood pressure 122/80, pulse 66.   Assessment/Plan: Hypertension: BP today at goal (<130/80). However, patient is struggling to schedule his medications as he works night shifts 3 days a week and is asleep at night the other 4 days of the week. Because of this, he misses doses of his medication while he is asleep, specifically the hydralazine. Discontinued hydralazine to work around his schedule as it is a TID drug. Worked with patient to dose BP medications that works with his daily schedule. Also increased Bystolic to 20 mg daily. Advised patient to call clinic if his HR is low on his home BP readings. Follow-up in HTN clinic in 3 weeks on 01/12/18 to assess BP control after medication changes.   Thank you, Lelan Pons. Patterson Hammersmith, PharmD  [facility]  12/23/2017 4:04 PM  Patient seen with Danella Penton, PharmD Candidate

## 2017-12-23 NOTE — Patient Instructions (Addendum)
Return for a a follow up appointment in 01/12/2018 at 3:00 pm.   Your blood pressure today is 122/80   Check your blood pressure at home daily (if able) and keep record of the readings.  Take your BP meds as follows: STOP hydralazine 25 mg three times daily Amlodipine 10 mg 8 pm Avapro 234 mg 8 am Bystolic 20 mg 8 am Furosemide 20 mg as needed  For your other medications: Take your colchicine 0.6 mg, twice daily Your other medications are all once daily or as needed, take them as directed by your doctors prescribing those medications.  Bring all of your meds, your BP cuff and your record of home blood pressures to your next appointment.  Exercise as you're able, try to walk approximately 30 minutes per day.  Keep salt intake to a minimum, especially watch canned and prepared boxed foods.  Eat more fresh fruits and vegetables and fewer canned items.  Avoid eating in fast food restaurants.    HOW TO TAKE YOUR BLOOD PRESSURE: . Rest 5 minutes before taking your blood pressure. .  Don't smoke or drink caffeinated beverages for at least 30 minutes before. . Take your blood pressure before (not after) you eat. . Sit comfortably with your back supported and both feet on the floor (don't cross your legs). . Elevate your arm to heart level on a table or a desk. . Use the proper sized cuff. It should fit smoothly and snugly around your bare upper arm. There should be enough room to slip a fingertip under the cuff. The bottom edge of the cuff should be 1 inch above the crease of the elbow. . Ideally, take 3 measurements at one sitting and record the average.

## 2017-12-25 NOTE — Progress Notes (Signed)
Thanks. This has indeed been a challenge. He needs to do his part.

## 2017-12-29 MED FILL — FLUOROURACIL 5% CREAM: 5 | 14 days supply | Qty: 40 | Fill #0

## 2018-01-02 ENCOUNTER — Other Ambulatory Visit: Payer: Self-pay | Admitting: Endocrinology

## 2018-01-02 MED FILL — JARDIANCE 25 MG TABLET: 25 | 30 days supply | Qty: 30 | Fill #0

## 2018-01-02 MED FILL — METFORMIN HCL ER 500 MG TAB: 500 | 30 days supply | Qty: 120 | Fill #0

## 2018-01-03 ENCOUNTER — Other Ambulatory Visit: Payer: Self-pay

## 2018-01-04 ENCOUNTER — Encounter (HOSPITAL_COMMUNITY): Payer: Self-pay | Admitting: Emergency Medicine

## 2018-01-04 ENCOUNTER — Emergency Department (HOSPITAL_COMMUNITY)
Admission: EM | Admit: 2018-01-04 | Discharge: 2018-01-04 | Disposition: A | Discharge status: Home / Self Care | Payer: BC Managed Care – PPO | Attending: Emergency Medicine | Admitting: Emergency Medicine

## 2018-01-04 ENCOUNTER — Emergency Department (HOSPITAL_COMMUNITY): Payer: BC Managed Care – PPO

## 2018-01-04 DIAGNOSIS — Z87891 Personal history of nicotine dependence: Secondary | ICD-10-CM | POA: Diagnosis not present

## 2018-01-04 DIAGNOSIS — M79602 Pain in left arm: Secondary | ICD-10-CM | POA: Insufficient documentation

## 2018-01-04 DIAGNOSIS — R55 Syncope and collapse: Secondary | ICD-10-CM | POA: Insufficient documentation

## 2018-01-04 DIAGNOSIS — I10 Essential (primary) hypertension: Secondary | ICD-10-CM | POA: Insufficient documentation

## 2018-01-04 DIAGNOSIS — R0789 Other chest pain: Secondary | ICD-10-CM | POA: Diagnosis present

## 2018-01-04 DIAGNOSIS — Z7984 Long term (current) use of oral hypoglycemic drugs: Secondary | ICD-10-CM | POA: Insufficient documentation

## 2018-01-04 DIAGNOSIS — Z85828 Personal history of other malignant neoplasm of skin: Secondary | ICD-10-CM | POA: Insufficient documentation

## 2018-01-04 DIAGNOSIS — E119 Type 2 diabetes mellitus without complications: Secondary | ICD-10-CM | POA: Insufficient documentation

## 2018-01-04 DIAGNOSIS — Z7982 Long term (current) use of aspirin: Secondary | ICD-10-CM | POA: Diagnosis not present

## 2018-01-04 LAB — BASIC METABOLIC PANEL
ANION GAP: 13 (ref 5–15)
BUN: 11 mg/dL (ref 6–20)
CALCIUM: 8.9 mg/dL (ref 8.9–10.3)
CO2: 26 mmol/L (ref 22–32)
CREATININE: 1.04 mg/dL (ref 0.61–1.24)
Chloride: 103 mmol/L (ref 98–111)
GFR calc Af Amer: >60 mL/min (ref >60)
GFR calc non Af Amer: >60 mL/min (ref >60)
GLUCOSE: 320 mg/dL — AB (ref 70–99)
POTASSIUM: 3.6 mmol/L (ref 3.5–5.1)
Sodium: 142 mmol/L (ref 135–145)

## 2018-01-04 LAB — CBC
HCT: 44.7 % (ref 39.0–52.0)
HEMOGLOBIN: 16 g/dL (ref 13.0–17.0)
MCH: 34.2 pg — ABNORMAL HIGH (ref 26.0–34.0)
MCHC: 35.8 g/dL (ref 30.0–36.0)
MCV: 95.5 fL (ref 78.0–100.0)
PLATELETS: 180 10*3/uL (ref 150–400)
RBC: 4.68 MIL/uL (ref 4.22–5.81)
RDW: 13 % (ref 11.5–15.5)
WBC: 5.1 10*3/uL (ref 4.0–10.5)

## 2018-01-04 LAB — I-STAT TROPONIN, ED
TROPONIN I, POC: 0.02 ng/mL (ref 0.00–0.08)
TROPONIN I, POC: 0.03 ng/mL (ref 0.00–0.08)

## 2018-01-04 LAB — D-DIMER, QUANTITATIVE: D-Dimer, Quant: 0.33 ug/mL-FEU (ref 0.00–0.50)

## 2018-01-04 MED ORDER — LORAZEPAM 2 MG/ML IJ SOLN
0.5000 mg | Freq: Once | INTRAMUSCULAR | Status: AC
  Administered 2018-01-04: 0.5 mg via INTRAVENOUS
  Filled 2018-01-04: qty 1

## 2018-01-04 MED ORDER — KETOROLAC TROMETHAMINE 30 MG/ML IJ SOLN
30.0000 mg | Freq: Once | INTRAMUSCULAR | Status: AC
  Administered 2018-01-04: 30 mg via INTRAVENOUS
  Filled 2018-01-04: qty 1

## 2018-01-04 NOTE — ED Provider Notes (Signed)
3:55 PM Care assumed from Dr. Zenia Resides.    At time of transfer care, patient is awaiting a delta troponin.  If delta troponin is negative, patient will be discharged for outpatient management.  Patient was aware of this plan prior to transfer care.    Anticipate reassessment after results.  Delta troponin was negative.  Patient given the return precautions and follow-up instructions.  He will see his cardiologist in the next few days.  Patient had no other questions or concerns and was discharged in good condition.  Clinical Impression: 1. Other chest pain     Disposition: Discharge  Condition: Good  I have discussed the results, Dx and Tx plan with the pt(& family if present). He/she/they expressed understanding and agree(s) with the plan. Discharge instructions discussed at great length. Strict return precautions discussed and pt &/or family have verbalized understanding of the instructions. No further questions at time of discharge.    Discharge Medication List as of 01/04/2018  5:26 PM      Follow Up: Elby Showers, MD [id number] [id number] [place] DEPT [address] [id number] Kyle Dawson       Manroop Jakubowicz, Gwenyth Allegra, MD 01/05/18 [phone]

## 2018-01-04 NOTE — ED Notes (Signed)
Pt requesting food, remains drowsy from medication. S/O at bedside.

## 2018-01-04 NOTE — Discharge Instructions (Signed)
Your work-up today showed negative cardiac enzymes twice.  Your other work-up was reassuring.  Please follow-up with your primary doctor and a cardiologist in the next several days.  If any symptoms change or worsen, please return to the nearest emergency department.

## 2018-01-04 NOTE — ED Notes (Signed)
Bed: WA06 Expected date:  Expected time:  Means of arrival:  Comments: EMS/n/v/d/near-syncope

## 2018-01-04 NOTE — ED Triage Notes (Signed)
Pt c/o left sided chest pain to left arm that started about hour ago. Pt has SOB also.

## 2018-01-04 NOTE — ED Provider Notes (Signed)
[facility] DEPT Provider Note   CSN: [id number] Arrival date & time: 01/04/18  1127     History   Chief Complaint Chief Complaint  Patient presents with  . Chest Pain  . Shortness of Breath    HPI Kyle Dawson is a 52 y.o. male.  52 year old male presents with chest pain that began about 9 hours prior to arrival when he woke up this morning.  Patient was also hypertensive with a systolic blood pressure 782.  He did not have any associated dyspnea or diaphoresis.  He self medicated with his antihypertensives and blood pressure improved.  Patient states that 2 hours after his symptoms started he began to develop left arm pain as well.  All the symptoms have been persistent and not exertional.  No recent cough or congestion.  No pain with movement of his torso or arm.  No prior history of same.  Recent admission for chest pain and had a coronary CT that did not show any acute findings.     Past Medical History:  Diagnosis Date  . Basal cell carcinoma   . Chest pain   . Diabetes mellitus without complication ([place])   . Edema    lower extremity  . Hypertension   . Squamous cell carcinoma    R ear, nose, each side of face    Patient Active Problem List   Diagnosis Date Noted  . Chest pain   . SOB (shortness of breath)   . Dyspnea 11/26/2017  . Hx of transient ischemic attack (TIA) 11/26/2017  . Alcohol use 11/26/2017  . Elevated ALT measurement 11/26/2017  . 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 Controlled type 2 diabetes mellitus without complication ([place]) 42/35/3614  . Obesity 10/06/2014  . Nocturnal leg cramps 10/06/2014  . History of squamous cell carcinoma of skin 07/10/2011  . History  of basal cell carcinoma 07/10/2011  . History of dysplastic nevus 07/10/2011  . Overweight 11/19/2010  . Uncontrolled hypertension 11/09/2010  . Dependent edema 11/09/2010    Past Surgical History:  Procedure Laterality Date  .  Removal basal cell carcinoma    . Removal squamous cell carcinoma          Home Medications    Prior to Admission medications   Medication Sig Start Date End Date Taking? Authorizing Provider  amLODipine (NORVASC) 10 MG tablet Take 1 tablet (10 mg total) by mouth daily. 11/30/17   Eugenie Filler, MD  aspirin EC 81 MG EC tablet Take 1 tablet (81 mg total) by mouth daily. 12/01/17   Eugenie Filler, MD  atorvastatin (LIPITOR) 10 MG tablet Take 1 tablet (10 mg total) by mouth daily at 6 PM. 11/30/17   Eugenie Filler, MD  colchicine 0.6 MG tablet Take 1 tablet (0.6 mg total) by mouth 2 (two) times daily. 11/30/17 02/28/18  Eugenie Filler, MD  furosemide (LASIX) 20 MG tablet Take on a as needed basis if wt 3lb a day or 5lb a week weight gain 12/09/17   Almyra Deforest, PA  glucose blood (ONETOUCH VERIO) test strip Use as instructed to check blood sugar once a day dx code E11.9 02/16/16   Elayne Snare, MD  irbesartan (AVAPRO) 300 MG tablet Take 1 tablet (300 mg total) by mouth daily. 12/01/17   Eugenie Filler, MD  JARDIANCE 25 MG TABS tablet TAKE 1 TABLET BY MOUTH ONCE DAILY 01/02/18   Elayne Snare, MD  metFORMIN (GLUCOPHAGE-XR) 500 MG 24 hr tablet  TAKE 4 TABLETS BY MOUTH DAILY WITH SUPPER 01/02/18   Elayne Snare, MD  nebivolol 20 MG TABS Take 1 tablet (20 mg total) by mouth daily. 12/23/17   Fay Records, MD  [facility] DELICA LANCETS 29H MISC Use to check blood sugar once a day dx code E11.9 02/16/16   Elayne Snare, MD  pantoprazole (PROTONIX) 40 MG tablet Take 1 tablet (40 mg total) by mouth daily. 12/01/17   Eugenie Filler, MD  sildenafil (REVATIO) 20 MG tablet TAKE AS DIRECTED Patient taking differently: Take 1 tablet by mouth daily as needed for ed 09/26/17   Elayne Snare, MD    Family History Family History  Problem Relation Age of Onset  . Peripheral vascular disease Mother 45  . Hypertension Mother   . Heart failure Mother   . CAD Maternal Grandfather   . Heart failure Paternal  Grandfather   . Hypertension Father     Social History Social History   Tobacco Use  . Smoking status: Former Research scientist (life sciences)  . Smokeless tobacco: Never Used  Substance Use Topics  . Alcohol use: Yes  . Drug use: No     Allergies   Oxycodone   Review of Systems Review of Systems  All other systems reviewed and are negative.    Physical Exam Updated Vital Signs BP (!) 146/87 (BP Location: Right Arm)   Pulse 89   Resp 19   SpO2 100%   Physical Exam  Constitutional: He is oriented to person, place, and time. He appears well-developed and well-nourished.  Non-toxic appearance. No distress.  HENT:  Head: Normocephalic and atraumatic.  Eyes: Pupils are equal, round, and reactive to light. Conjunctivae, EOM and lids are normal.  Neck: Normal range of motion. Neck supple. No tracheal deviation present. No thyroid mass present.  Cardiovascular: Normal rate, regular rhythm and normal heart sounds. Exam reveals no gallop.  No murmur heard. Pulmonary/Chest: Effort normal and breath sounds normal. No stridor. No respiratory distress. He has no decreased breath sounds. He has no wheezes. He has no rhonchi. He has no rales.  Abdominal: Soft. Normal appearance and bowel sounds are normal. He exhibits no distension. There is no tenderness. There is no rebound and no CVA tenderness.  Musculoskeletal: Normal range of motion. He exhibits no edema or tenderness.  Neurological: He is alert and oriented to person, place, and time. He has normal strength. No cranial nerve deficit or sensory deficit. GCS eye subscore is 4. GCS verbal subscore is 5. GCS motor subscore is 6.  Skin: Skin is warm and dry. No abrasion and no rash noted.  Psychiatric: He has a normal mood and affect. His speech is normal and behavior is normal.  Nursing note and vitals reviewed.    ED Treatments / Results  Labs (all labs ordered are listed, but only abnormal results are displayed) Labs Reviewed  BASIC METABOLIC PANEL   CBC  I-STAT TROPONIN, ED    EKG None  Radiology No results found.  Procedures Procedures (including critical care time)  Medications Ordered in ED Medications  LORazepam (ATIVAN) injection 0.5 mg (has no administration in time range)     Initial Impression / Assessment and Plan / ED Course  I have reviewed the triage vital signs and the nursing notes.  Pertinent labs & imaging results that were available during my care of the patient were reviewed by me and considered in my medical decision making (see chart for details).     Patient medicated for pain  here and feels better.  Given Ativan and ketorolac and symptoms have resolved.  Low suspicion for ACS as patient has had constant pain now for over 8 hours.  First troponin was negative and will perform repeat.  Care assigned to Dr. Sherry Ruffing  Final Clinical Impressions(s) / ED Diagnoses   Final diagnoses:  None    ED Discharge Orders    None       Lacretia Leigh, MD 01/04/18 1546

## 2018-01-12 ENCOUNTER — Ambulatory Visit: Payer: BC Managed Care – PPO

## 2018-01-17 ENCOUNTER — Ambulatory Visit (INDEPENDENT_AMBULATORY_CARE_PROVIDER_SITE_OTHER): Payer: BC Managed Care – PPO | Admitting: Pharmacist

## 2018-01-17 ENCOUNTER — Encounter: Payer: Self-pay | Admitting: Pharmacist

## 2018-01-17 VITALS — BP 160/70 | HR 76

## 2018-01-17 DIAGNOSIS — I10 Essential (primary) hypertension: Secondary | ICD-10-CM

## 2018-01-17 MED ORDER — CHLORTHALIDONE 25 MG PO TABS: 25.0000 mg | ORAL_TABLET | Freq: Every day | ORAL | 1 refills | Status: DC

## 2018-01-17 NOTE — Progress Notes (Signed)
Patient ID: Kyle Dawson                 DOB: 1966-04-17                      MRN: [id number]     HPI: Kyle Dawson is a 52 y.o. male referred by Dr. Harrington Dawson to HTN clinic.  PMH significant for HTN, DMand TIA.  Patient denies fatigue and dyspnea, but does complain of moderate-severe peripheral edema in his ankles. Kyle Dawson has been taking furosemide daily instead of PRN for his swelling with frequent urination. During most recent OV with HTN clinic his hydralazine was supposed to be discontinued for to compliance issues. However, the patient reports that he has continued hydralazine BID due to uncontrolled HTN. Bystolic dose was increased from 47m to 270mdaily at his last visit as well.   He presents today for BP follow up.  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 Normally wakes up at 4 pm when he is working. He is having a problem timing his medications due to having night shifts only some days per week. He also has increasing worries about being on several BP lowering medications and still being uncontrolled.  Current HTN meds:  Amlodipine 1071maily Furosemide 41m50mily Irbesartan 300m465KPly Bystolic 41mg54SFly Hydralazine 25mg81m   Previously tried: Hydralazine 25mg 3m- compliance issues with TID dose benazepril 40 mg olmesartan 40 mg isosorbide 30 mg- discontinued due to interaction with sildenafil.  BP goal: 130/80  Family History: CAD in his maternal grandfather; Heart failure in his mother and paternal grandfather; Hypertension in his father and mother; Peripheral vascular disease (age of onset: 69) in63is mother.  Social History: former smoker, does drink alcohol approx. 6-8 drinks a week on work days  Diet: half of meals are from home, and half meals are out. Has salad, wrap, or soup at work. Cooks at home, eats turkeyKuwaitken, fish, and a vegetable. Trying 80% vegetables, 20% animal protein. Drinks water, drinks 3-4 cups of  black coffee per week. Patient mentioned the possibility of going vegan/vegetarian.  Exercise: [place] walking about 3 miles per day. Does about 6,000-7,000 steps at work.   Home BP readings: Home cuff not working at this time. 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  Wt Readings from Last 3 Encounters:  12/09/17 234 lb (106.1 kg)  11/26/17 235 lb 7.2 oz (106.8 kg)  03/08/17 228 lb (103.4 kg)   BP Readings from Last 3 Encounters:  01/17/18 (!) 160/70  01/04/18 (!) 150/97  12/23/17 122/80   Pulse Readings from Last 3 Encounters:  01/17/18 76  01/04/18 77  12/23/17 66    Past Medical History:  Diagnosis Date  . Basal cell carcinoma   . Chest pain   . Diabetes mellitus without complication (HCC)  [place]ema    lower extremity  . Hypertension   . Squamous cell carcinoma    R ear, nose, each side of face    Current Outpatient Medications on File Prior to Visit  Medication Sig Dispense Refill  . amLODipine (NORVASC) 10 MG tablet Take 1 tablet (10 mg total) by mouth daily. 30 tablet 0  . Ascorbic Acid (VITAMIN C PO) Take 1 tablet by mouth daily.     . aspiMarland Kitchenin EC 81 MG EC tablet Take 1 tablet (81  mg total) by mouth daily.    Marland Kitchen atorvastatin (LIPITOR) 10 MG tablet Take 1 tablet (10 mg total) by mouth daily at 6 PM. (Patient taking differently: Take 10 mg by mouth daily. ) 30 tablet 0  . B Complex Vitamins (B COMPLEX PO) Take 1 tablet by mouth daily.     . colchicine 0.6 MG tablet Take 1 tablet (0.6 mg total) by mouth 2 (two) times daily. 60 tablet 2  . furosemide (LASIX) 20 MG tablet Take on a as needed basis if wt 3lb a day or 5lb a week weight gain (Patient taking differently: Take 40 mg by mouth daily. Take on a as needed basis if wt 3lb a day or 5lb a week weight gain) 30 tablet 2  . glucose blood (ONETOUCH VERIO) test strip Use as instructed to check blood sugar once a day dx code E11.9 100 each 1  . hydrALAZINE (APRESOLINE) 25 MG tablet Take  25 mg by mouth 2 (two) times daily.    Marland Kitchen ibuprofen (ADVIL,MOTRIN) 200 MG tablet Take 600 mg by mouth daily as needed for moderate pain.    Marland Kitchen irbesartan (AVAPRO) 300 MG tablet Take 1 tablet (300 mg total) by mouth daily. 30 tablet 0  . JARDIANCE 25 MG TABS tablet TAKE 1 TABLET BY MOUTH ONCE DAILY 30 tablet 3  . metFORMIN (GLUCOPHAGE-XR) 500 MG 24 hr tablet TAKE 4 TABLETS BY MOUTH DAILY WITH SUPPER (Patient taking differently: Take 2,000 mg by mouth at bedtime. ) 120 tablet 3  . nebivolol 20 MG TABS Take 1 tablet (20 mg total) by mouth daily. 90 tablet 3  . NIACIN PO Take 1 tablet by mouth daily.    . Omega-3 Fatty Acids (FISH OIL PO) Take 1 tablet by mouth daily.     Glory [facility]bush DELICA LANCETS 16W MISC Use to check blood sugar once a day dx code E11.9 100 each 1  . pantoprazole (PROTONIX) 40 MG tablet Take 1 tablet (40 mg total) by mouth daily. 30 tablet 2  . sildenafil (REVATIO) 20 MG tablet TAKE AS DIRECTED (Patient taking differently: Take 1 tablet by mouth daily as needed for ed) 60 tablet 1   No current facility-administered medications on file prior to visit.     Allergies  Allergen Reactions  . Oxycodone     Hyper, sensitive to this    Blood pressure (!) 160/70, pulse 76.  Uncontrolled hypertension Patient's HTN remains uncontrolled at 160/70 on 5 blood pressure lowering agents. Patient confirmed that he was compliant with all medications and that he was still taking hydralazine BID. As stated in HPI, furosemide was being taken daily due to edema. Due to amlodipine and irbesartan being at max dose, initiate chlorthalidone 25 mg daily. Counseled patient on the possible benefit of splitting BP medications between morning and evening. Also counseled patient on diuretic use and stressed the importance of using chlorthalidone daily and furosemide as needed.   Patient reports walking approximately 3 miles per day and has a fairly active job. Patient also suggested the possibility of becoming  vegan/vegetarian if BP continues to remain uncontrolled. Counseled on Mediterranean diet and the importance of a healthy diet that limits sodium. Patient seems motivated to eat healthier. Patient will be seen for BMET in 2 weeks to monitor electrolytes and will follow-up in 4 weeks.    Jonette Eva PharmD. Candidate 01/17/2018 3:35 PM  Present during patient assessment. Assessment and plan discussed and agreed  Kyle Dawson PharmD, BCPS, CPP [facility] [address] [place] [address] 01/17/2018 4:00 PM

## 2018-01-17 NOTE — Patient Instructions (Addendum)
Return for a a follow up appointment in 4 weeks  Go to the lab in 2 weeks  Check your blood pressure at home daily (if able) and keep record of the readings.  Take your BP meds as follows:  *START taking chlorthalidone 14m daily* *CHANGE furosemide to 24mdaily as needed for fluid retention*  Bring all of your meds, your BP cuff and your record of home blood pressures to your next appointment.  Exercise as you're able, try to walk approximately 30 minutes per day.  Keep salt intake to a minimum, especially watch canned and prepared boxed foods.  Eat more fresh fruits and vegetables and fewer canned items.  Avoid eating in fast food restaurants.    HOW TO TAKE YOUR BLOOD PRESSURE: . Rest 5 minutes before taking your blood pressure. .  Don't smoke or drink caffeinated beverages for at least 30 minutes before. . Take your blood pressure before (not after) you eat. . Sit comfortably with your back supported and both feet on the floor (don't cross your legs). . Elevate your arm to heart level on a table or a desk. . Use the proper sized cuff. It should fit smoothly and snugly around your bare upper arm. There should be enough room to slip a fingertip under the cuff. The bottom edge of the cuff should be 1 inch above the crease of the elbow. . Ideally, take 3 measurements at one sitting and record the average.

## 2018-01-17 NOTE — Assessment & Plan Note (Addendum)
Patient's HTN remains uncontrolled at 160/70 on 5 blood pressure lowering agents. Patient confirmed that he was compliant with all medications and that he was still taking hydralazine BID. As stated in HPI, furosemide was being taken daily due to edema. Due to amlodipine and irbesartan being at max dose, initiate chlorthalidone 25 mg daily. Counseled patient on the possible benefit of splitting BP medications between morning and evening. Also counseled patient on diuretic use and stressed the importance of using chlorthalidone daily and furosemide only as needed.    Patient reports walking approximately 3 miles per day and has a fairly active job. Patient also suggested the possibility of becoming vegan/vegetarian if BP continues to remain uncontrolled. Counseled on Mediterranean diet and the importance of a healthy diet that limits sodium. Patient seems motivated to eat healthier. Patient will be seen for BMET in 2 weeks to monitor electrolytes and will follow-up in 4 weeks.

## 2018-02-10 ENCOUNTER — Other Ambulatory Visit (HOSPITAL_COMMUNITY): Payer: Self-pay

## 2018-02-13 ENCOUNTER — Other Ambulatory Visit: Payer: Self-pay | Admitting: Internal Medicine

## 2018-02-13 DIAGNOSIS — Z8639 Personal history of other endocrine, nutritional and metabolic disease: Secondary | ICD-10-CM

## 2018-02-13 DIAGNOSIS — I10 Essential (primary) hypertension: Secondary | ICD-10-CM

## 2018-02-13 DIAGNOSIS — Z125 Encounter for screening for malignant neoplasm of prostate: Secondary | ICD-10-CM

## 2018-02-13 DIAGNOSIS — E119 Type 2 diabetes mellitus without complications: Secondary | ICD-10-CM

## 2018-02-13 DIAGNOSIS — G459 Transient cerebral ischemic attack, unspecified: Secondary | ICD-10-CM

## 2018-02-14 ENCOUNTER — Other Ambulatory Visit: Payer: Self-pay | Admitting: Internal Medicine

## 2018-02-15 ENCOUNTER — Ambulatory Visit: Payer: Self-pay

## 2018-02-15 NOTE — Progress Notes (Deleted)
Patient ID: [name]                 DOB: 06-07-1965                      MRN: [id number]     HPI: Kyle Dawson is a 52 y.o. male referred by Dr. Harrington Challenger to HTN clinic.  PMH significant for HTN, DMand TIA.  Patient denies fatigue and dyspnea, but does complain of moderate-severe peripheral edema in his ankles. Kyle Dawson. During most recent OV with HTN clinic his hydralazine was supposed to be discontinued for to compliance issues. However, the patient reports that he has continued hydralazine BID due to uncontrolled HTN. Bystolic dose was increased from 47m to 241mdaily at his last visit as well.   He presents today for BP follow up.  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 Normally wakes up at 4 pm when he is working. He is having a problem timing his medications due to having night shifts only some days per week. He also has increasing worries about being on several BP lowering medications and still being uncontrolled.  Current HTN meds:  Amlodipine 1061maily Furosemide 42m80mily Irbesartan 300m951OAly Bystolic 42mg41YSly Hydralazine 25mg68m  Chlorthalidone 25 mg daily  Previously tried: Hydralazine 25mg 66m- compliance issues with TID dose benazepril 40 mg olmesartan 40 mg isosorbide 30 mg- discontinued due to interaction with sildenafil.  BP goal: 130/80  Family History: CAD in his maternal grandfather; Heart failure in his mother and paternal grandfather; Hypertension in his father and mother; Peripheral vascular disease (age of onset: 69) in78is mother.  Social History: former smoker, does drink alcohol approx. 6-8 drinks a week on work days  Diet: half of meals are from home, and half meals are out. Has salad, wrap, or soup at work. Cooks at home, eats turkeyKuwaitken, fish, and a vegetable. Trying 80% vegetables, 20% animal protein. Drinks  water, drinks 3-4 cups of black coffee per week. Patient mentioned the possibility of going vegan/vegetarian.  Exercise: Reports walking about 3 miles per day. Does about 6,000-7,000 steps at work.   Home BP readings: Home cuff not working at this time. 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  Wt Readings from Last 3 Encounters:  12/09/17 234 lb (106.1 kg)  11/26/17 235 lb 7.2 oz (106.8 kg)  03/08/17 228 lb (103.4 kg)   BP Readings from Last 3 Encounters:  01/17/18 (!) 160/70  01/04/18 (!) 150/97  12/23/17 122/80   Pulse Readings from Last 3 Encounters:  01/17/18 76  01/04/18 77  12/23/17 66    Past Medical History:  Diagnosis Date  . Basal cell carcinoma   . Chest pain   . Diabetes mellitus without complication (HCC)  [place]hest[place]ema    lower extremity  . Hypertension   . Squamous cell carcinoma    R ear, nose, each side of face    Current Outpatient Medications on File Prior to Visit  Medication Sig Dispense Refill  . amLODipine (NORVASC) 10 MG tablet Take 1 tablet (10 mg total) by mouth daily. 30 tablet 0  . Ascorbic Acid (VITAMIN C PO) Take 1 tablet by mouth daily.     . aspiMarland Kitchenin EC 81 MG EC tablet  Take 1 tablet (81 mg total) by mouth daily.    Marland Kitchen atorvastatin (LIPITOR) 10 MG tablet Take 1 tablet (10 mg total) by mouth daily at 6 PM. (Patient taking differently: Take 10 mg by mouth daily. ) 30 tablet 0  . B Complex Vitamins (B COMPLEX PO) Take 1 tablet by mouth daily.     . chlorthalidone (HYGROTON) 25 MG tablet Take 1 tablet (25 mg total) by mouth daily. 30 tablet 1  . colchicine 0.6 MG tablet Take 1 tablet (0.6 mg total) by mouth 2 (two) times daily. 60 tablet 2  . furosemide (LASIX) 20 MG tablet Take on a as needed basis if wt 3lb a day or 5lb a week weight gain (Patient taking differently: Take 40 mg by mouth daily. Take on a as needed basis if wt 3lb a day or 5lb a week weight gain) 30 tablet 2  . glucose blood (ONETOUCH VERIO) test  strip Use as instructed to check blood sugar once a day dx code E11.9 100 each 1  . hydrALAZINE (APRESOLINE) 25 MG tablet Take 25 mg by mouth 2 (two) times daily.    Marland Kitchen ibuprofen (ADVIL,MOTRIN) 200 MG tablet Take 600 mg by mouth daily as needed for moderate pain.    Marland Kitchen irbesartan (AVAPRO) 300 MG tablet Take 1 tablet (300 mg total) by mouth daily. 30 tablet 0  . JARDIANCE 25 MG TABS tablet TAKE 1 TABLET BY MOUTH ONCE DAILY 30 tablet 3  . metFORMIN (GLUCOPHAGE-XR) 500 MG 24 hr tablet TAKE 4 TABLETS BY MOUTH DAILY WITH SUPPER (Patient taking differently: Take 2,000 mg by mouth at bedtime. ) 120 tablet 3  . nebivolol 20 MG TABS Take 1 tablet (20 mg total) by mouth daily. 90 tablet 3  . NIACIN PO Take 1 tablet by mouth daily.    . Omega-3 Fatty Acids (FISH OIL PO) Take 1 tablet by mouth daily.     Glory Rosebush DELICA LANCETS 82N MISC Use to check blood sugar once a day dx code E11.9 100 each 1  . pantoprazole (PROTONIX) 40 MG tablet Take 1 tablet (40 mg total) by mouth daily. 30 tablet 2  . sildenafil (REVATIO) 20 MG tablet TAKE AS DIRECTED (Patient taking differently: Take 1 tablet by mouth daily as needed for ed) 60 tablet 1   No current facility-administered medications on file prior to visit.     Allergies  Allergen Reactions  . Oxycodone     Hyper, sensitive to this    There were no vitals taken for this visit.  No problem-specific Assessment & Plan notes found for this encounter.    Tommy Medal PharmD CPP [facility] [address] [place] [address] 02/15/2018 1:54 PM

## 2018-02-16 ENCOUNTER — Other Ambulatory Visit: Payer: BC Managed Care – PPO | Admitting: Internal Medicine

## 2018-02-17 ENCOUNTER — Telehealth: Payer: Self-pay | Admitting: Internal Medicine

## 2018-02-17 ENCOUNTER — Other Ambulatory Visit: Payer: Self-pay | Admitting: Internal Medicine

## 2018-02-17 NOTE — Telephone Encounter (Signed)
Has not kept appointment for Lab draw prior to CPE next week

## 2018-02-20 ENCOUNTER — Telehealth: Payer: Self-pay | Admitting: Internal Medicine

## 2018-02-20 NOTE — Telephone Encounter (Signed)
Patient called this afternoon at 3:00 and states that he is in the process of changing jobs and he currently has no job and therefore he has no insurance and he cannot afford to come and pay for the visit.  For this reason, he has to cancel the appointment for CPE on 10/15.  I advised that we are currently scheduling in mid to late January and we could go ahead and schedule for that timeframe.  He did not want to book then because he doesn't know that his insurance will be satisfied by that time or not.  States that he will call once he has another job and has insurance.

## 2018-02-20 NOTE — Telephone Encounter (Signed)
Patient had lab appointment on 10/10 for his physical exam scheduled on 10/15.  He no showed for that appointment.  I contacted the patient on 10/10 and left message that he would need to come in on 10/11 or Monday, 10/14 in order to have his labs PRIOR to his physical exam which is scheduled for Tuesday, 10/15 at 3:00 p.m.  Patient did not show Friday, 10/11 or today.  Dr. Renold Genta contacted the patient on 10/11 and also left a message regarding his needing to come in for labs as well.  Patient has not returned any of our phone calls.

## 2018-02-21 ENCOUNTER — Encounter: Payer: BC Managed Care – PPO | Admitting: Internal Medicine

## 2018-02-24 ENCOUNTER — Encounter: Payer: Self-pay | Admitting: Cardiology

## 2018-03-08 ENCOUNTER — Telehealth: Payer: Self-pay

## 2018-03-08 NOTE — Telephone Encounter (Signed)
New message    Just an FYI. We have made several attempts to contact this patient including sending a letter to schedule or reschedule their echocardiogram. We will be removing the patient from the echo WQ.   Thank you 

## 2018-03-15 NOTE — Telephone Encounter (Signed)
Noted  

## 2018-03-20 ENCOUNTER — Ambulatory Visit: Payer: BC Managed Care – PPO | Admitting: Internal Medicine

## 2018-03-28 ENCOUNTER — Encounter: Payer: Self-pay | Admitting: Internal Medicine

## 2018-09-25 ENCOUNTER — Observation Stay (HOSPITAL_COMMUNITY)
Admission: EM | Admit: 2018-09-25 | Discharge: 2018-09-26 | Disposition: A | Discharge status: Home / Self Care | Payer: 59 | Attending: Internal Medicine | Admitting: Internal Medicine

## 2018-09-25 ENCOUNTER — Other Ambulatory Visit: Payer: Self-pay

## 2018-09-25 ENCOUNTER — Encounter (HOSPITAL_COMMUNITY): Payer: Self-pay | Admitting: Emergency Medicine

## 2018-09-25 ENCOUNTER — Emergency Department (HOSPITAL_COMMUNITY): Payer: 59

## 2018-09-25 DIAGNOSIS — Z1159 Encounter for screening for other viral diseases: Secondary | ICD-10-CM | POA: Insufficient documentation

## 2018-09-25 DIAGNOSIS — R739 Hyperglycemia, unspecified: Secondary | ICD-10-CM

## 2018-09-25 DIAGNOSIS — Z885 Allergy status to narcotic agent status: Secondary | ICD-10-CM | POA: Insufficient documentation

## 2018-09-25 DIAGNOSIS — Z9114 Patient's other noncompliance with medication regimen: Secondary | ICD-10-CM | POA: Insufficient documentation

## 2018-09-25 DIAGNOSIS — I10 Essential (primary) hypertension: Secondary | ICD-10-CM

## 2018-09-25 DIAGNOSIS — Z7982 Long term (current) use of aspirin: Secondary | ICD-10-CM | POA: Diagnosis not present

## 2018-09-25 DIAGNOSIS — Z85828 Personal history of other malignant neoplasm of skin: Secondary | ICD-10-CM | POA: Insufficient documentation

## 2018-09-25 DIAGNOSIS — F1010 Alcohol abuse, uncomplicated: Secondary | ICD-10-CM | POA: Diagnosis not present

## 2018-09-25 DIAGNOSIS — E1165 Type 2 diabetes mellitus with hyperglycemia: Secondary | ICD-10-CM | POA: Diagnosis not present

## 2018-09-25 DIAGNOSIS — Z794 Long term (current) use of insulin: Secondary | ICD-10-CM | POA: Diagnosis not present

## 2018-09-25 DIAGNOSIS — Z7984 Long term (current) use of oral hypoglycemic drugs: Secondary | ICD-10-CM | POA: Diagnosis not present

## 2018-09-25 DIAGNOSIS — Z79899 Other long term (current) drug therapy: Secondary | ICD-10-CM | POA: Diagnosis not present

## 2018-09-25 DIAGNOSIS — I480 Paroxysmal atrial fibrillation: Secondary | ICD-10-CM

## 2018-09-25 DIAGNOSIS — E119 Type 2 diabetes mellitus without complications: Secondary | ICD-10-CM | POA: Diagnosis not present

## 2018-09-25 DIAGNOSIS — I313 Pericardial effusion (noninflammatory): Secondary | ICD-10-CM | POA: Diagnosis not present

## 2018-09-25 DIAGNOSIS — J9811 Atelectasis: Secondary | ICD-10-CM | POA: Diagnosis not present

## 2018-09-25 DIAGNOSIS — Z7289 Other problems related to lifestyle: Secondary | ICD-10-CM

## 2018-09-25 DIAGNOSIS — E1159 Type 2 diabetes mellitus with other circulatory complications: Secondary | ICD-10-CM | POA: Diagnosis not present

## 2018-09-25 DIAGNOSIS — I4891 Unspecified atrial fibrillation: Secondary | ICD-10-CM | POA: Diagnosis not present

## 2018-09-25 DIAGNOSIS — R079 Chest pain, unspecified: Secondary | ICD-10-CM | POA: Diagnosis not present

## 2018-09-25 DIAGNOSIS — I119 Hypertensive heart disease without heart failure: Secondary | ICD-10-CM | POA: Diagnosis not present

## 2018-09-25 DIAGNOSIS — Z87891 Personal history of nicotine dependence: Secondary | ICD-10-CM | POA: Insufficient documentation

## 2018-09-25 DIAGNOSIS — I161 Hypertensive emergency: Secondary | ICD-10-CM | POA: Diagnosis not present

## 2018-09-25 DIAGNOSIS — Z7901 Long term (current) use of anticoagulants: Secondary | ICD-10-CM | POA: Diagnosis not present

## 2018-09-25 DIAGNOSIS — Z789 Other specified health status: Secondary | ICD-10-CM

## 2018-09-25 LAB — CBC WITH DIFFERENTIAL/PLATELET
Abs Immature Granulocytes: 0.01 10*3/uL (ref 0.00–0.07)
Basophils Absolute: 0.1 10*3/uL (ref 0.0–0.1)
Basophils Relative: 1 %
Eosinophils Absolute: 0 10*3/uL (ref 0.0–0.5)
Eosinophils Relative: 1 %
HCT: 52.7 % — ABNORMAL HIGH (ref 39.0–52.0)
Hemoglobin: 19 g/dL — ABNORMAL HIGH (ref 13.0–17.0)
Immature Granulocytes: 0 %
Lymphocytes Relative: 23 %
Lymphs Abs: 1.2 10*3/uL (ref 0.7–4.0)
MCH: 32.9 pg (ref 26.0–34.0)
MCHC: 36.1 g/dL — ABNORMAL HIGH (ref 30.0–36.0)
MCV: 91.3 fL (ref 80.0–100.0)
Monocytes Absolute: 0.7 10*3/uL (ref 0.1–1.0)
Monocytes Relative: 13 %
Neutro Abs: 3.2 10*3/uL (ref 1.7–7.7)
Neutrophils Relative %: 62 %
Platelets: 223 10*3/uL (ref 150–400)
RBC: 5.77 MIL/uL (ref 4.22–5.81)
RDW: 11.9 % (ref 11.5–15.5)
WBC: 5.3 10*3/uL (ref 4.0–10.5)
nRBC: 0 % (ref 0.0–0.2)

## 2018-09-25 LAB — BASIC METABOLIC PANEL
Anion gap: 17 — ABNORMAL HIGH (ref 5–15)
BUN: 9 mg/dL (ref 6–20)
CO2: 27 mmol/L (ref 22–32)
Calcium: 10.1 mg/dL (ref 8.9–10.3)
Chloride: 94 mmol/L — ABNORMAL LOW (ref 98–111)
Creatinine, Ser: 1.09 mg/dL (ref 0.61–1.24)
GFR calc Af Amer: >60 mL/min (ref >60)
GFR calc non Af Amer: >60 mL/min (ref >60)
Glucose, Bld: 434 mg/dL — ABNORMAL HIGH (ref 70–99)
Potassium: 3.6 mmol/L (ref 3.5–5.1)
Sodium: 138 mmol/L (ref 135–145)

## 2018-09-25 LAB — MAGNESIUM: Magnesium: 1.6 mg/dL — ABNORMAL LOW (ref 1.7–2.4)

## 2018-09-25 LAB — COMPREHENSIVE METABOLIC PANEL
ALT: 34 U/L (ref 0–44)
AST: 35 U/L (ref 15–41)
Albumin: 3.9 g/dL (ref 3.5–5.0)
Alkaline Phosphatase: 49 U/L (ref 38–126)
Anion gap: 15 (ref 5–15)
BUN: 10 mg/dL (ref 6–20)
CO2: 24 mmol/L (ref 22–32)
Calcium: 9.1 mg/dL (ref 8.9–10.3)
Chloride: 98 mmol/L (ref 98–111)
Creatinine, Ser: 1.03 mg/dL (ref 0.61–1.24)
GFR calc Af Amer: >60 mL/min (ref >60)
GFR calc non Af Amer: >60 mL/min (ref >60)
Glucose, Bld: 316 mg/dL — ABNORMAL HIGH (ref 70–99)
Potassium: 3.8 mmol/L (ref 3.5–5.1)
Sodium: 137 mmol/L (ref 135–145)
Total Bilirubin: 1.9 mg/dL — ABNORMAL HIGH (ref 0.3–1.2)
Total Protein: 6.8 g/dL (ref 6.5–8.1)

## 2018-09-25 LAB — SARS CORONAVIRUS 2 BY RT PCR (HOSPITAL ORDER, PERFORMED IN ~~LOC~~ HOSPITAL LAB): SARS Coronavirus 2: NEGATIVE

## 2018-09-25 LAB — TSH: TSH: 1.383 u[IU]/mL (ref 0.350–4.500)

## 2018-09-25 LAB — APTT: aPTT: 27 seconds (ref 24–36)

## 2018-09-25 LAB — PROTIME-INR
INR: 0.9 (ref 0.8–1.2)
Prothrombin Time: 12.4 seconds (ref 11.4–15.2)

## 2018-09-25 LAB — HEMOGLOBIN A1C
Hgb A1c MFr Bld: 9.7 % — ABNORMAL HIGH (ref 4.8–5.6)
Mean Plasma Glucose: 231.69 mg/dL

## 2018-09-25 LAB — GLUCOSE, CAPILLARY: Glucose-Capillary: 370 mg/dL — ABNORMAL HIGH (ref 70–99)

## 2018-09-25 LAB — BRAIN NATRIURETIC PEPTIDE: B Natriuretic Peptide: 42.9 pg/mL (ref 0.0–100.0)

## 2018-09-25 LAB — TROPONIN I: Troponin I: 0.04 ng/mL (ref <0.03)

## 2018-09-25 MED ORDER — INSULIN ASPART 100 UNIT/ML ~~LOC~~ SOLN
0.0000 [IU] | Freq: Three times a day (TID) | SUBCUTANEOUS | Status: DC
  Administered 2018-09-26: 08:00:00 5 [IU] via SUBCUTANEOUS
  Administered 2018-09-26: 12:00:00 8 [IU] via SUBCUTANEOUS

## 2018-09-25 MED ORDER — INSULIN ASPART 100 UNIT/ML ~~LOC~~ SOLN
10.0000 [IU] | Freq: Once | SUBCUTANEOUS | Status: AC
  Administered 2018-09-25: 22:00:00 10 [IU] via SUBCUTANEOUS

## 2018-09-25 MED ORDER — SODIUM CHLORIDE 0.9 % IV SOLN: 250.0000 mL | INTRAVENOUS | Status: DC | PRN

## 2018-09-25 MED ORDER — METOPROLOL SUCCINATE ER 50 MG PO TB24
50.0000 mg | ORAL_TABLET | Freq: Every day | ORAL | Status: DC
  Administered 2018-09-25 – 2018-09-26 (×2): 50 mg via ORAL
  Filled 2018-09-25 (×2): qty 1

## 2018-09-25 MED ORDER — DILTIAZEM HCL-DEXTROSE 100-5 MG/100ML-% IV SOLN (PREMIX)
5.0000 mg/h | INTRAVENOUS | Status: DC
  Administered 2018-09-25: 16:00:00 5 mg/h via INTRAVENOUS
  Filled 2018-09-25: qty 100

## 2018-09-25 MED ORDER — METOPROLOL TARTRATE 5 MG/5ML IV SOLN
5.0000 mg | Freq: Four times a day (QID) | INTRAVENOUS | Status: DC
  Administered 2018-09-25 – 2018-09-26 (×3): 5 mg via INTRAVENOUS
  Filled 2018-09-25 (×3): qty 5

## 2018-09-25 MED ORDER — IRBESARTAN 300 MG PO TABS
300.0000 mg | ORAL_TABLET | Freq: Every day | ORAL | Status: DC
  Administered 2018-09-25 – 2018-09-26 (×2): 300 mg via ORAL
  Filled 2018-09-25 (×3): qty 1

## 2018-09-25 MED ORDER — INSULIN ASPART 100 UNIT/ML ~~LOC~~ SOLN: 0.0000 [IU] | Freq: Three times a day (TID) | SUBCUTANEOUS | Status: DC

## 2018-09-25 MED ORDER — METFORMIN HCL ER 500 MG PO TB24: 500.0000 mg | ORAL_TABLET | Freq: Every day | ORAL | Status: DC

## 2018-09-25 MED ORDER — HEPARIN BOLUS VIA INFUSION
4800.0000 [IU] | Freq: Once | INTRAVENOUS | Status: AC
  Administered 2018-09-25: 19:00:00 4800 [IU] via INTRAVENOUS
  Filled 2018-09-25: qty 4800

## 2018-09-25 MED ORDER — VITAMIN B-1 100 MG PO TABS
100.0000 mg | ORAL_TABLET | Freq: Every day | ORAL | Status: DC
  Administered 2018-09-25 – 2018-09-26 (×2): 100 mg via ORAL
  Filled 2018-09-25 (×2): qty 1

## 2018-09-25 MED ORDER — FUROSEMIDE 40 MG PO TABS
40.0000 mg | ORAL_TABLET | Freq: Every day | ORAL | Status: DC
  Administered 2018-09-25 – 2018-09-26 (×2): 40 mg via ORAL
  Filled 2018-09-25 (×2): qty 1

## 2018-09-25 MED ORDER — AMLODIPINE BESYLATE 5 MG PO TABS
5.0000 mg | ORAL_TABLET | Freq: Every day | ORAL | Status: DC
  Administered 2018-09-25 – 2018-09-26 (×2): 5 mg via ORAL
  Filled 2018-09-25 (×2): qty 1

## 2018-09-25 MED ORDER — HEPARIN (PORCINE) 25000 UT/250ML-% IV SOLN
1300.0000 [IU]/h | INTRAVENOUS | Status: DC
  Administered 2018-09-25: 19:00:00 1300 [IU]/h via INTRAVENOUS
  Filled 2018-09-25: qty 250

## 2018-09-25 MED ORDER — ADULT MULTIVITAMIN W/MINERALS CH
1.0000 | ORAL_TABLET | Freq: Every day | ORAL | Status: DC
  Administered 2018-09-25 – 2018-09-26 (×2): 1 via ORAL
  Filled 2018-09-25: qty 1

## 2018-09-25 MED ORDER — ONDANSETRON HCL 4 MG/2ML IJ SOLN: 4.0000 mg | Freq: Four times a day (QID) | INTRAMUSCULAR | Status: DC | PRN

## 2018-09-25 MED ORDER — LORAZEPAM 2 MG/ML IJ SOLN: 2.0000 mg | INTRAMUSCULAR | Status: DC | PRN

## 2018-09-25 MED ORDER — MAGNESIUM SULFATE 2 GM/50ML IV SOLN
2.0000 g | Freq: Once | INTRAVENOUS | Status: AC
  Administered 2018-09-25: 18:00:00 2 g via INTRAVENOUS
  Filled 2018-09-25: qty 50

## 2018-09-25 MED ORDER — LACTATED RINGERS IV BOLUS: 1000.0000 mL | Freq: Once | INTRAVENOUS | Status: DC

## 2018-09-25 MED ORDER — FOLIC ACID 1 MG PO TABS
1.0000 mg | ORAL_TABLET | Freq: Every day | ORAL | Status: DC
  Administered 2018-09-25 – 2018-09-26 (×2): 1 mg via ORAL
  Filled 2018-09-25 (×2): qty 1

## 2018-09-25 MED ORDER — INSULIN ASPART 100 UNIT/ML ~~LOC~~ SOLN: 0.0000 [IU] | Freq: Every day | SUBCUTANEOUS | Status: DC

## 2018-09-25 MED ORDER — ACETAMINOPHEN 325 MG PO TABS: 650.0000 mg | ORAL_TABLET | ORAL | Status: DC | PRN

## 2018-09-25 MED ORDER — HEPARIN (PORCINE) 25000 UT/250ML-% IV SOLN
1300.0000 [IU]/h | INTRAVENOUS | Status: DC
  Administered 2018-09-25 – 2018-09-26 (×2): 1300 [IU]/h via INTRAVENOUS
  Filled 2018-09-25: qty 250

## 2018-09-25 MED ORDER — SODIUM CHLORIDE 0.9% FLUSH: 3.0000 mL | INTRAVENOUS | Status: DC | PRN

## 2018-09-25 MED ORDER — ATORVASTATIN CALCIUM 10 MG PO TABS: 10.0000 mg | ORAL_TABLET | Freq: Every day | ORAL | Status: DC

## 2018-09-25 MED ORDER — DILTIAZEM HCL-DEXTROSE 100-5 MG/100ML-% IV SOLN (PREMIX)
5.0000 mg/h | INTRAVENOUS | Status: DC
  Administered 2018-09-25 – 2018-09-26 (×2): 15 mg/h via INTRAVENOUS
  Filled 2018-09-25 (×3): qty 100

## 2018-09-25 MED ORDER — IOHEXOL 350 MG/ML SOLN
100.0000 mL | Freq: Once | INTRAVENOUS | Status: AC | PRN
  Administered 2018-09-25: 19:00:00 100 mL via INTRAVENOUS

## 2018-09-25 MED ORDER — SODIUM CHLORIDE 0.9 % IV BOLUS
1000.0000 mL | Freq: Once | INTRAVENOUS | Status: AC
  Administered 2018-09-25: 18:00:00 1000 mL via INTRAVENOUS

## 2018-09-25 MED ORDER — SODIUM CHLORIDE 0.9% FLUSH
3.0000 mL | Freq: Two times a day (BID) | INTRAVENOUS | Status: DC
  Administered 2018-09-26: 09:00:00 3 mL via INTRAVENOUS

## 2018-09-25 MED ORDER — DILTIAZEM HCL 25 MG/5ML IV SOLN
20.0000 mg | Freq: Once | INTRAVENOUS | Status: AC
  Administered 2018-09-25: 16:00:00 20 mg via INTRAVENOUS
  Filled 2018-09-25: qty 5

## 2018-09-25 NOTE — ED Notes (Signed)
ED TO INPATIENT HANDOFF REPORT  ED Nurse Name and Phone #: Benjamine Mola [phone]  S Name/Age/Gender Kyle Dawson 53 y.o. male Room/Bed: 024C/024C  Code Status   Code Status: Full Code  Home/SNF/Other Home Patient oriented to: self, place, time and situation Is this baseline? Yes   Triage Complete: Triage complete  Chief Complaint Sweaty, EKG Error  Triage Note Pt here from [facility]. Pt is in afib rvr. Pt also complains of being sweaty and SOB. Pt also has hypertension and has not been able to take his medication for 1 week.    Allergies Allergies  Allergen Reactions  . Oxycodone Other (See Comments)    Hyper, sensitive to this    Level of Care/Admitting Diagnosis ED Disposition    ED Disposition Condition [place]: [facility] [[id number]]  Level of Care: Progressive [102]  I expect the patient will be discharged within 24 hours: No (not a candidate for 5C-Observation unit)  Covid Evaluation: N/A  Diagnosis: Atrial fibrillation with rapid ventricular response [facility]) [[id number]]  Admitting Physician: Shela Leff [[id number]]  Attending Physician: Shela Leff [[id number]]  PT Class (Do Not Modify): Observation [104]  PT Acc Code (Do Not Modify): Observation [10022]       B Medical/Surgery History Past Medical History:  Diagnosis Date  . Basal cell carcinoma   . Chest pain   . Diabetes mellitus without complication ([place])   . Edema    lower extremity  . Hypertension   . Squamous cell carcinoma    R ear, nose, each side of face   Past Surgical History:  Procedure Laterality Date  . Removal basal cell carcinoma    . Removal squamous cell carcinoma       A IV Location/Drains/Wounds Patient Lines/Drains/Airways Status   Active Line/Drains/Airways    Name:   Placement date:   Placement time:   Site:   Days:   Peripheral IV 09/25/18 Left Antecubital   09/25/18    1545    Antecubital   less  than 1   Peripheral IV 09/25/18 Right Antecubital   09/25/18    1813    Antecubital   less than 1          Intake/Output Last 24 hours  Intake/Output Summary (Last 24 hours) at 09/25/2018 2057 Last data filed at 09/25/2018 2057 Gross per 24 hour  Intake 48.03 ml  Output 725 ml  Net -676.97 ml    Labs/Imaging Results for orders placed or performed during the hospital encounter of 09/25/18 (from the past 48 hour(s))  TSH     Status: None   Collection Time: 09/25/18  3:19 PM  Result Value Ref Range   TSH 1.383 0.350 - 4.500 uIU/mL    Comment: Performed by a 3rd Generation assay with a functional sensitivity of <=0.01 uIU/mL. Performed at [facility], [place] [address], [place], [place] [address]   Basic metabolic panel     Status: Abnormal   Collection Time: 09/25/18  3:40 PM  Result Value Ref Range   Sodium 138 135 - 145 mmol/L   Potassium 3.6 3.5 - 5.1 mmol/L   Chloride 94 (L) 98 - 111 mmol/L   CO2 27 22 - 32 mmol/L   Glucose, Bld 434 (H) 70 - 99 mg/dL   BUN 9 6 - 20 mg/dL   Creatinine, Ser 1.09 0.61 - 1.24 mg/dL   Calcium 10.1 8.9 - 10.3 mg/dL   GFR calc  non Af Amer >60 >60 mL/min   GFR calc Af Amer >60 >60 mL/min   Anion gap 17 (H) 5 - 15    Comment: Performed at [facility] [address] [place], [place] [address]  Magnesium     Status: Abnormal   Collection Time: 09/25/18  3:40 PM  Result Value Ref Range   Magnesium 1.6 (L) 1.7 - 2.4 mg/dL    Comment: Performed at [facility] [address] [place], [place] [address]  Brain natriuretic peptide (IF shortness of breath has been documented this visit)     Status: None   Collection Time: 09/25/18  3:40 PM  Result Value Ref Range   B Natriuretic Peptide 42.9 0.0 - 100.0 pg/mL    Comment: Performed at [facility], [place] [address], [place], [place] [address]  CBC with Differential/Platelet     Status: Abnormal   Collection Time: 09/25/18  3:40 PM  Result Value Ref Range   WBC 5.3 4.0 - 10.5  K/uL   RBC 5.77 4.22 - 5.81 MIL/uL   Hemoglobin 19.0 (H) 13.0 - 17.0 g/dL   HCT 52.7 (H) 39.0 - 52.0 %   MCV 91.3 80.0 - 100.0 fL   MCH 32.9 26.0 - 34.0 pg   MCHC 36.1 (H) 30.0 - 36.0 g/dL   RDW 11.9 11.5 - 15.5 %   Platelets 223 150 - 400 K/uL   nRBC 0.0 0.0 - 0.2 %   Neutrophils Relative % 62 %   Neutro Abs 3.2 1.7 - 7.7 K/uL   Lymphocytes Relative 23 %   Lymphs Abs 1.2 0.7 - 4.0 K/uL   Monocytes Relative 13 %   Monocytes Absolute 0.7 0.1 - 1.0 K/uL   Eosinophils Relative 1 %   Eosinophils Absolute 0.0 0.0 - 0.5 K/uL   Basophils Relative 1 %   Basophils Absolute 0.1 0.0 - 0.1 K/uL   Immature Granulocytes 0 %   Abs Immature Granulocytes 0.01 0.00 - 0.07 K/uL    Comment: Performed at [facility], [address] [place], [place] [address]  APTT  (IF patient is taking Pradaxa)     Status: None   Collection Time: 09/25/18  3:40 PM  Result Value Ref Range   aPTT 27 24 - 36 seconds    Comment: Performed at [facility], [place] [address] [place], [place] [address]  Protime-INR (if pt is taking coumadin)     Status: None   Collection Time: 09/25/18  3:40 PM  Result Value Ref Range   Prothrombin Time 12.4 11.4 - 15.2 seconds   INR 0.9 0.8 - 1.2    Comment: (NOTE) INR goal varies based on device and disease states. Performed at [facility], [place] [address] [place],  [address]   SARS Coronavirus 2 (CEPHEID - Performed in [place] hospital lab), Hosp Order     Status: None   Collection Time: 09/25/18  3:50 PM  Result Value Ref Range   SARS Coronavirus 2 NEGATIVE NEGATIVE    Comment: (NOTE) If result is NEGATIVE SARS-CoV-2 target nucleic acids are NOT DETECTED. The SARS-CoV-2 RNA is generally detectable in upper and lower  respiratory specimens during the acute phase of infection. The lowest  concentration of SARS-CoV-2 viral copies this assay can detect is 250  copies / mL. A negative result does not preclude SARS-CoV-2 infection  and should not be  used as the sole basis for treatment or  other  patient management decisions.  A negative result may occur with  improper specimen collection / handling, submission of specimen other  than nasopharyngeal swab, presence of viral mutation(s) within the  areas targeted by this assay, and inadequate number of viral copies  (<250 copies / mL). A negative result must be combined with clinical  observations, patient history, and epidemiological information. If result is POSITIVE SARS-CoV-2 target nucleic acids are DETECTED. The SARS-CoV-2 RNA is generally detectable in upper and lower  respiratory specimens dur ing the acute phase of infection.  Positive  results are indicative of active infection with SARS-CoV-2.  Clinical  correlation with patient history and other diagnostic information is  necessary to determine patient infection status.  Positive results do  not rule out bacterial infection or co-infection with other viruses. If result is PRESUMPTIVE POSTIVE SARS-CoV-2 nucleic acids MAY BE PRESENT.   A presumptive positive result was obtained on the submitted specimen  and confirmed on repeat testing.  While 2019 novel coronavirus  (SARS-CoV-2) nucleic acids may be present in the submitted sample  additional confirmatory testing may be necessary for epidemiological  and / or clinical management purposes  to differentiate between  SARS-CoV-2 and other Sarbecovirus currently known to infect humans.  If clinically indicated additional testing with an alternate test  methodology [phone]) is advised. The SARS-CoV-2 RNA is generally  detectable in upper and lower respiratory sp ecimens during the acute  phase of infection. The expected result is Negative. Fact Sheet for Patients:  StrictlyIdeas.no Fact Sheet for Healthcare Providers: BankingDealers.co.za This test is not yet approved or cleared by the [place] FDA and has been authorized  for detection and/or diagnosis of SARS-CoV-2 by FDA under an Emergency Use Authorization (EUA).  This EUA will remain in effect (meaning this test can be used) for the duration of the COVID-19 declaration under Section 564(b)(1) of the Act, 21 U.S.C. section 360bbb-3(b)(1), unless the authorization is terminated or revoked sooner. Performed at [facility], [place] [address], [place], [place] [address]   Hemoglobin A1c     Status: Abnormal   Collection Time: 09/25/18  6:55 PM  Result Value Ref Range   Hgb A1c MFr Bld 9.7 (H) 4.8 - 5.6 %    Comment: (NOTE) Pre diabetes:          5.7%-6.4% Diabetes:              >6.4% Glycemic control for   <7.0% adults with diabetes    Mean Plasma Glucose 231.69 mg/dL    Comment: Performed at [facility] [address], [place], [place] [address]  Comprehensive metabolic panel     Status: Abnormal   Collection Time: 09/25/18  7:03 PM  Result Value Ref Range   Sodium 137 135 - 145 mmol/L   Potassium 3.8 3.5 - 5.1 mmol/L   Chloride 98 98 - 111 mmol/L   CO2 24 22 - 32 mmol/L   Glucose, Bld 316 (H) 70 - 99 mg/dL   BUN 10 6 - 20 mg/dL   Creatinine, Ser 1.03 0.61 - 1.24 mg/dL   Calcium 9.1 8.9 - 10.3 mg/dL   Total Protein 6.8 6.5 - 8.1 g/dL   Albumin 3.9 3.5 - 5.0 g/dL   AST 35 15 - 41 U/L   ALT 34 0 - 44 U/L   Alkaline Phosphatase 49 38 - 126 U/L   Total Bilirubin 1.9 (H) 0.3 - 1.2 mg/dL   GFR calc non Af Amer >  60 >60 mL/min   GFR calc Af Amer >60 >60 mL/min   Anion gap 15 5 - 15    Comment: Performed at [facility] [address], [place],  [address]   Dg Chest Port 1 View  Result Date: 09/25/2018 CLINICAL DATA:  Shortness of breath EXAM: PORTABLE CHEST 1 VIEW COMPARISON:  January 04, 2018 FINDINGS: No edema or consolidation. There is slight right base atelectasis. Heart size and pulmonary vascularity are within normal limits. No adenopathy. No bone lesions. IMPRESSION: No edema or consolidation. Slight right base  atelectasis. Stable cardiac silhouette. Electronically Signed   By: Lowella Grip III M.D.   On: 09/25/2018 16:04   Ct Angio Chest Aorta W/cm &/or Wo/cm  Result Date: 09/25/2018 CLINICAL DATA:  Elevated heart rate.  Chest pain.  Concern for PE. EXAM: CT ANGIOGRAPHY CHEST WITH CONTRAST TECHNIQUE: Multidetector CT imaging of the chest was performed using the standard protocol during bolus administration of intravenous contrast. Multiplanar CT image reconstructions and MIPs were obtained to evaluate the vascular anatomy. CONTRAST:  145m OMNIPAQUE IOHEXOL 350 MG/ML SOLN COMPARISON:  CT chest dated 11/29/2017 FINDINGS: Cardiovascular:  Again identified is a left-sided SVC. Mediastinum/Nodes: No enlarged mediastinal, hilar, or axillary lymph nodes. Thyroid gland, trachea, and esophagus demonstrate no significant findings. Lungs/Pleura: Lungs are clear. No pleural effusion or pneumothorax. Upper Abdomen: There is diffuse hepatic steatosis. There is some scarring of the upper pole the left kidney. Musculoskeletal: No chest wall abnormality. No acute or significant osseous findings. Review of the MIP images confirms the above findings. IMPRESSION: 1. No PE identified.  No dissection. 2. The lungs are clear. 3. Again noted is a left-sided SVC, a normal variant. 4. Hepatic steatosis. Electronically Signed   By: CConstance HolsterM.D.   On: 09/25/2018 19:22    Pending Labs Unresulted Labs (From admission, onward)    Start     Ordered   09/27/18 0500  Heparin level (unfractionated)  Daily,   R     09/25/18 2033   09/26/18 0500  CBC  Daily,   R     09/25/18 1746   09/26/18 04034 Basic metabolic panel  Tomorrow morning,   R     09/25/18 1902   09/26/18 0500  Lipid panel  Tomorrow morning,   R     09/25/18 1902   09/26/18 0100  Heparin level (unfractionated)  Once-Timed,   R     09/25/18 2033   09/25/18 2016  Troponin I - Add-On to previous collection  Add-on,   R     09/25/18 2015   Unscheduled  Occult  blood card to lab, stool  As needed,   R     09/25/18 1902          Vitals/Pain Today's Vitals   09/25/18 1710 09/25/18 1715 09/25/18 1910 09/25/18 1930  BP: (!) 184/118 (!) 213/144  (!) 186/111  Pulse: 79 67  61  Resp: (!) 22 (!) 24  19  Temp:      TempSrc:      SpO2: 97% 97%  97%  Weight:      Height:      PainSc:   0-No pain     Isolation Precautions No active isolations  Medications Medications  metoprolol tartrate (LOPRESSOR) injection 5 mg (5 mg Intravenous Given 09/25/18 1857)  metoprolol succinate (TOPROL-XL) 24 hr tablet 50 mg (has no administration in time range)  amLODipine (NORVASC) tablet 5 mg (has no administration in time  range)  irbesartan (AVAPRO) tablet 300 mg (has no administration in time range)  furosemide (LASIX) tablet 40 mg (has no administration in time range)  atorvastatin (LIPITOR) tablet 10 mg (has no administration in time range)  diltiazem (CARDIZEM) 100 mg in dextrose 5% 152m (1 mg/mL) infusion (has no administration in time range)  acetaminophen (TYLENOL) tablet 650 mg (has no administration in time range)  ondansetron (ZOFRAN) injection 4 mg (has no administration in time range)  sodium chloride flush (NS) 0.9 % injection 3 mL (has no administration in time range)  sodium chloride flush (NS) 0.9 % injection 3 mL (has no administration in time range)  0.9 %  sodium chloride infusion (has no administration in time range)  insulin aspart (novoLOG) injection 10 Units (has no administration in time range)  insulin aspart (novoLOG) injection 0-15 Units (has no administration in time range)  insulin aspart (novoLOG) injection 0-5 Units (has no administration in time range)  LORazepam (ATIVAN) injection 2-3 mg (has no administration in time range)  folic acid (FOLVITE) tablet 1 mg (has no administration in time range)  multivitamin with minerals tablet 1 tablet (has no administration in time range)  thiamine (VITAMIN B-1) tablet 100 mg (has no  administration in time range)  heparin ADULT infusion 100 units/mL (25000 units/2542msodium chloride 0.45%) (has no administration in time range)  diltiazem (CARDIZEM) injection 20 mg (20 mg Intravenous Given 09/25/18 1541)  sodium chloride 0.9 % bolus 1,000 mL (1,000 mLs Intravenous New Bag/Given 09/25/18 1758)  magnesium sulfate IVPB 2 g 50 mL (0 g Intravenous Stopped 09/25/18 2057)  heparin bolus via infusion 4,800 Units (4,800 Units Intravenous Bolus from Bag 09/25/18 1908)  iohexol (OMNIPAQUE) 350 MG/ML injection 100 mL (100 mLs Intravenous Contrast Given 09/25/18 1836)    Mobility walks Low fall risk   Focused Assessments Cardiac Assessment Handoff:  Cardiac Rhythm: Atrial fibrillation Lab Results  Component Value Date   TROPONINI <0.03 11/27/2017   Lab Results  Component Value Date   DDIMER 0.33 01/04/2018   Does the Patient currently have chest pain? No     R Recommendations: See Admitting Provider Note  Report given to:   Additional Notes:

## 2018-09-25 NOTE — Progress Notes (Signed)
ANTICOAGULATION CONSULT NOTE - Initial Consult  Pharmacy Consult for heparin Indication: atrial fibrillation  Allergies  Allergen Reactions  . Oxycodone     Hyper, sensitive to this    Patient Measurements: Height: 5' 10"  (177.8 cm) Weight: 232 lb (105.2 kg) IBW/kg (Calculated) : 73 Heparin Dosing Weight: 95.4 kg  Vital Signs: Temp: 98.2 F (36.8 C) (05/18 1512) Temp Source: Oral (05/18 1512) BP: 213/144 (05/18 1715) Pulse Rate: 67 (05/18 1715)  Labs: Recent Labs    09/25/18 1540  HGB 19.0*  HCT 52.7*  PLT 223  APTT 27  LABPROT 12.4  INR 0.9  CREATININE 1.09    Estimated Creatinine Clearance: 93 mL/min (by C-G formula based on SCr of 1.09 mg/dL).   Medical History: Past Medical History:  Diagnosis Date  . Basal cell carcinoma   . Chest pain   . Diabetes mellitus without complication ([place])   . Edema    lower extremity  . Hypertension   . Squamous cell carcinoma    R ear, nose, each side of face    Medications:  Scheduled:  . heparin  4,800 Units Intravenous Once    Assessment: 30 yof presenting with Afib RVR - started on diltiazem. No AC PTA.   Hgb 19, plt 223. No s/sx of bleeding.   Goal of Therapy:  Heparin level 0.3-0.7 units/ml Monitor platelets by anticoagulation protocol: Yes   Plan:  Give 4800 units bolus x 1 Start heparin infusion at 1300 units/hr Check anti-Xa level in 6 hours and daily while on heparin Continue to monitor H&H and platelets  Antonietta Jewel, PharmD, [place] Clinical Pharmacist  Pager: [phone] Phone: [phone] 09/25/2018,5:46 PM

## 2018-09-25 NOTE — ED Triage Notes (Signed)
Pt here from [facility]. Pt is in afib rvr. Pt also complains of being sweaty and SOB. Pt also has hypertension and has not been able to take his medication for 1 week.

## 2018-09-25 NOTE — H&P (Signed)
Cardiology Admission History and Physical:   Patient ID: Kyle Dawson MRN: [id number]; DOB: May 27, 1965   Admission date: 09/25/2018  Primary Care Provider: Elby Showers, MD Primary Cardiologist: Dorris Carnes, MD  Primary Electrophysiologist:  None   Chief Complaint:  Dizzy  Patient Profile:   Kyle Dawson is a 53 y.o. male with daily alcohol consumption, history of snoring, difficulty control hypertension, type 2 diabetes mellitus, and no prior history of coronary disease being admitted for new onset atrial fibrillation with RVR.  History of Present Illness:   Kyle Dawson has been out of his antihypertensive medication for approximately 2 weeks.  During this timeframe he has had episodes where he would feel warm and his head with sweat.  There was some sensation of dizziness.  He denied chest pain, orthopnea, PND, or syncope.  He has had no episodes of chest pain.  No history of coronary disease.  With 1 of the episodes occurring today, he measured his blood pressure and it was greater than 200/100.  Heart rate on pulse oximetry was greater than 160 bpm and alerted that he was probably in atrial fibrillation.  Because of this he came to the emergency room.  He is currently comfortable without any cardiac complaints.   Past Medical History:  Diagnosis Date   Basal cell carcinoma    Chest pain    Diabetes mellitus without complication (HCC)    Edema    lower extremity   Hypertension    Squamous cell carcinoma    R ear, nose, each side of face    Past Surgical History:  Procedure Laterality Date   Removal basal cell carcinoma     Removal squamous cell carcinoma       Medications Prior to Admission: Prior to Admission medications   Medication Sig Start Date End Date Taking? Authorizing Provider  amLODipine (NORVASC) 10 MG tablet Take 1 tablet (10 mg total) by mouth daily. 11/30/17  Yes Eugenie Filler, MD  aspirin EC 81 MG EC tablet Take 1 tablet (81 mg  total) by mouth daily. 12/01/17  Yes Eugenie Filler, MD  hydrALAZINE (APRESOLINE) 25 MG tablet Take 25 mg by mouth 2 (two) times daily.   Yes [provider]  irbesartan (AVAPRO) 300 MG tablet Take 1 tablet (300 mg total) by mouth daily. 12/01/17  Yes Eugenie Filler, MD  nebivolol 20 MG TABS Take 1 tablet (20 mg total) by mouth daily. 12/23/17  Yes Fay Records, MD  Omega-3 Fatty Acids (FISH OIL PO) Take 1 tablet by mouth daily.    Yes [provider]  atorvastatin (LIPITOR) 10 MG tablet Take 1 tablet (10 mg total) by mouth daily at 6 PM. Patient not taking: Reported on 09/25/2018 11/30/17   Eugenie Filler, MD  furosemide (LASIX) 20 MG tablet Take on a as needed basis if wt 3lb a day or 5lb a week weight gain Patient not taking: Reported on 09/25/2018 12/09/17   Almyra Deforest, PA  glucose blood (ONETOUCH VERIO) test strip Use as instructed to check blood sugar once a day dx code E11.9 02/16/16   Elayne Snare, MD  JARDIANCE 25 MG TABS tablet TAKE 1 TABLET BY MOUTH ONCE DAILY Patient not taking: Reported on 09/25/2018 01/02/18   Elayne Snare, MD  metFORMIN (GLUCOPHAGE-XR) 500 MG 24 hr tablet TAKE 4 TABLETS BY MOUTH DAILY WITH SUPPER Patient not taking: No sig reported 01/02/18   Elayne Snare, MD  [facility] DELICA LANCETS 82N MISC Use  to check blood sugar once a day dx code E11.9 02/16/16   Elayne Snare, MD  pantoprazole (PROTONIX) 40 MG tablet Take 1 tablet (40 mg total) by mouth daily. 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[place] [employer]    Employer: [employer]   Occupation: Manufacturing engineer strain: Not on file   Food insecurity:    Worry: Not on file    Inability: Not on file   Transportation needs:    Medical: Not on file    Non-medical: Not on file  Tobacco Use   Smoking status: Former Smoker   Smokeless tobacco: Never Used  Substance and Sexual Activity   Alcohol use: Yes   Drug use: No   Sexual activity: Not on file  Lifestyle   Physical activity:    Days per week: Not on file    Minutes per session: Not on file   Stress: Not on file  Relationships   Social connections:    Talks on phone: Not on file    Gets together: Not on file    Attends religious service: Not on file    Active member of club or organization: Not on file    Attends meetings of clubs or organizations: Not on file    Relationship status: Not on file   Intimate partner violence:    Fear of current or ex partner: Not on file    Emotionally abused: Not on file    Physically abused: Not on file    Forced sexual activity: Not on file  Other Topics Concern   Not on file  Social History Narrative   Patient drinks 1 cup of coffee a day     Family History:   The patient's family history includes CAD in his maternal grandfather; Heart failure in his mother and paternal grandfather; Hypertension in his father and mother; Peripheral vascular disease (age of onset: 36) in his mother.    ROS:  Please see the history of present illness.  As to drinks of bourbon each evening.  He snores.  He has excessive daytime fatigue.  Primary physician is Dr. Tommie Ard Baxley.  All other ROS reviewed and negative.     Physical Exam/Data:   Vitals:   09/25/18 1658 09/25/18 1700 09/25/18 1710 09/25/18 1715  BP:  (!) 228/138 (!) 184/118 (!) 213/144  Pulse:  (!) 146 79 67  Resp: 17  (!) 22 (!) 24  Temp:      TempSrc:      SpO2:  96% 97% 97%    Weight:      Height:        Intake/Output Summary (Last 24 hours) at 09/25/2018 1836 Last data filed at 09/25/2018 1759 Gross per 24 hour  Intake --  Output 725 ml  Net -725 ml   Last 3 Weights 09/25/2018 12/09/2017 11/26/2017  Weight (lbs) 232 lb 234 lb 235 lb 7.2 oz  Weight (kg) 105.235 kg 106.142 kg 106.8 kg     Body mass index is 33.29 kg/m.  General:  Well nourished, well developed, in no acute distress HEENT: normal Lymph: no adenopathy Neck: no JVD Endocrine:  No thryomegaly Vascular: No carotid bruits; FA pulses 2+ bilaterally without bruits  Cardiac:  normal S1, S2; there is a rapid irregularly irregular heart rate and rhythm. No murmur. Lungs:  clear to auscultation bilaterally, no wheezing, rhonchi or rales  Abd: soft, nontender, no hepatomegaly  Ext: no edema Musculoskeletal:  No deformities, BUE and BLE strength normal and equal Skin: warm and dry  Neuro:  CNs 2-12 intact, no focal abnormalities noted Psych:  Normal affect    EKG:  The ECG that was done 09/25/2018 was personally reviewed and demonstrates atrial fibrillation with rapid ventricular response at 172 bpm with J-point depression noted.  Relevant CV Studies: 2D Doppler echocardiogram 11/27/2017: ------------------------------------------------------------------- Study Conclusions  - Left ventricle: The cavity size was normal. Wall thickness was   increased in a pattern of moderate LVH. Systolic function was   normal. The estimated ejection fraction was in the range of 55%   to 60%. Wall motion was normal; there were no regional wall   motion abnormalities. - Aortic valve: Valve area (Vmax): 2.64 cm^2. - Pericardium, extracardiac: A small to moderate pericardial   effusion was identified circumferential to the heart.  Laboratory Data:  Chemistry Recent Labs  Lab 09/25/18 1540  NA 138  K 3.6  CL 94*  CO2 27  GLUCOSE 434*  BUN 9  CREATININE 1.09  CALCIUM 10.1  GFRNONAA >60  GFRAA >60   ANIONGAP 17*    No results for input(s): PROT, ALBUMIN, AST, ALT, ALKPHOS, BILITOT in the last 168 hours. Hematology Recent Labs  Lab 09/25/18 1540  WBC 5.3  RBC 5.77  HGB 19.0*  HCT 52.7*  MCV 91.3  MCH 32.9  MCHC 36.1*  RDW 11.9  PLT 223   Cardiac EnzymesNo results for input(s): TROPONINI in the last 168 hours. No results for input(s): TROPIPOC in the last 168 hours.  BNP Recent Labs  Lab 09/25/18 1540  BNP 42.9    DDimer No results for input(s): DDIMER in the last 168 hours.  Radiology/Studies:  Dg Chest Port 1 View  Result Date: 09/25/2018 CLINICAL DATA:  Shortness of breath EXAM: PORTABLE CHEST 1 VIEW COMPARISON:  January 04, 2018 FINDINGS: No edema or consolidation. There is slight right base atelectasis. Heart size and pulmonary vascularity are within normal limits. No adenopathy. No bone lesions. IMPRESSION: No edema or consolidation. Slight right base atelectasis. Stable cardiac silhouette. Electronically Signed   By: Lowella Grip III M.D.   On: 09/25/2018 16:04    Assessment and Plan:   1. Atrial fibrillation with rapid ventricular response though no evidence of heart failure or hemodynamic compromise.  IV diltiazem is been started.  Beta-blocker therapy will be reinstituted. 2. Severe blood pressure elevation, in setting of chronic hypertensive heart disease with LVH likely related to sudden withdrawal of medications approximately 2 weeks ago.  Will reinstitute typical antihypertensive regimen but convert nebivolol to metoprolol. 3. Polycythemia, may need further evaluation rule out polycythemia vera. 4. Snoring, with high likelihood of sleep apnea 5. Diabetes mellitus, type II, suspect poorly controlled.  Recommendations:  1. Being admitted to the hospital for rate control with IV diltiazem and reinstitution of beta-blocker therapy.  Anticoagulation therapy will be started after blood  pressure is better controlled.  2D Doppler echocardiogram will be  obtained.  Cardiac markers will be obtained to rule out injury.  Discussed cessation of alcohol intake. 2. Reinstitute typical prior blood pressure medication.  Wean and DC diltiazem as tolerated by heart rate. 3. No specific evaluation at this time 4. May need outpatient sleep study 5. Will check hemoglobin A1c, start sliding scale, resume metformin.  He takes [place] but it is not on the formulary.   Severity of Illness: The appropriate patient status for this patient is INPATIENT. Inpatient status is judged to be reasonable and necessary in order to provide the required intensity of service to ensure the patient's safety. The patient's presenting symptoms, physical exam findings, and initial radiographic and laboratory data in the context of their chronic comorbidities is felt to place them at high risk for further clinical deterioration. Furthermore, it is not anticipated that the patient will be medically stable for discharge from the hospital within 2 midnights of admission. The following factors support the patient status of inpatient.   " The patient's presenting symptoms include dizziness and diaphoresis. " The worrisome physical exam findings include rapid heart rate not controlled with IV diltiazem. " The initial radiographic and laboratory data are worrisome because of none. " The chronic co-morbidities include diabetes/blood pressure..   * I certify that at the point of admission it is my clinical judgment that the patient will require inpatient hospital care spanning beyond 2 midnights from the point of admission due to high intensity of service, high risk for further deterioration and high frequency of surveillance required.*    For questions or updates, please contact [employer] Please consult [email or web address] for contact info under        Signed, Sinclair Grooms, MD  09/25/2018 6:36 PM

## 2018-09-25 NOTE — ED Notes (Signed)
Patient transported to CT by RN.

## 2018-09-25 NOTE — ED Notes (Signed)
Called CT to expedite scan

## 2018-09-25 NOTE — H&P (Addendum)
History and Physical    Kyle Dawson WFU:[id number] DOB: 1965/12/29 DOA: 09/25/2018  PCP: Elby Showers, MD [facility]  Chief Complaint: "hot sweats"  HPI: Kyle Dawson is a 53 y.o. male with medical history significant of type 2 diabetes, hypertension, skin squamous cell carcinoma, and conditions listed below being [facility] for management of A. fib with RVR.  Patient states he has been having intermittent "hot sweats" for the past 2 weeks.  He works at [employer] and wellness and while at work this afternoon this happened again.  He was sweating profusely.  His blood pressure was high with systolic in 202R and EKG done at the clinic revealed atrial fibrillation with rapid ventricular response.  Denies any associated chest pain, shortness of breath, dizziness, or heart palpitations.  Denies prior history of atrial fibrillation or thyroid problems.  He ran out of his blood pressure and diabetes medications 10 days ago.  He drinks 1-1/2 to 2 ounces of bourbon every night.  Last drink was yesterday night.  ED Course: On arrival, temperature 98.2 F, heart rate 181, respiratory rate 24, blood pressure 186/57, and SPO2 98% on room air.  TSH normal.  Blood glucose 434.  Bicarb 27 and anion gap 17.  Sodium 138, potassium 3.6.  BUN 9, creatinine 1.0.  Magnesium 1.6.  BNP 42.9.  White count 5.3, hemoglobin 19.0, platelet count 223.  INR 0.9.  COVID-19 rapid test negative.  Chest x-ray (personally reviewed) showing no edema or consolidation.  Slight right base atelectasis.  Stable cardiac silhouette.  CT angiogram without evidence of aortic dissection or PE, clear lungs.  Cardiology was consulted. Medications ordered in the ED: Cardizem bolus and infusion, Lasix 40 mg p.o., Lipitor 10 mg p.o., amlodipine 5 mg p.o., irbesartan 300 mg p.o., metoprolol succinate 50 mg p.o., and metoprolol tartrate 5 mg IV, 1 L IV  fluid bolus, and IV magnesium sulfate 2 g.  Review of Systems: All systems reviewed and apart from history of presenting illness, are negative.  Past Medical History:  Diagnosis Date   Basal cell carcinoma    Chest pain    Diabetes mellitus without complication (HCC)    Edema    lower extremity   Hypertension    Squamous cell carcinoma    R ear, nose, each side of face    Past Surgical History:  Procedure Laterality Date   Removal basal cell carcinoma     Removal squamous cell carcinoma       reports that he has quit smoking. He has never used smokeless tobacco. He reports current alcohol use. He reports that he does not use drugs.  Allergies  Allergen Reactions   Oxycodone Other (See Comments)    Hyper, sensitive to this    Family History  Problem Relation Age of Onset   Peripheral vascular disease Mother 3   Hypertension Mother    Heart failure Mother    CAD Maternal Grandfather    Heart failure Paternal Grandfather    Hypertension Father     Prior to Admission medications   Medication Sig Start Date End Date Taking? Authorizing Provider  amLODipine (NORVASC) 10 MG tablet Take 1 tablet (10 mg total) by mouth daily. 11/30/17  Yes Eugenie Filler, MD  aspirin EC 81 MG EC tablet Take 1 tablet (81 mg total) by mouth daily. 12/01/17  Yes Eugenie Filler, MD  hydrALAZINE (APRESOLINE) 25 MG  tablet Take 25 mg by mouth 2 (two) times daily.   Yes [provider]  irbesartan (AVAPRO) 300 MG tablet Take 1 tablet (300 mg total) by mouth daily. 12/01/17  Yes Eugenie Filler, MD  nebivolol 20 MG TABS Take 1 tablet (20 mg total) by mouth daily. 12/23/17  Yes Fay Records, MD  Omega-3 Fatty Acids (FISH OIL PO) Take 1 tablet by mouth daily.    Yes [provider]  atorvastatin (LIPITOR) 10 MG tablet Take 1 tablet (10 mg total) by mouth daily at 6 PM. Patient not taking: Reported on 09/25/2018 11/30/17   Eugenie Filler, MD  furosemide (LASIX)  20 MG tablet Take on a as needed basis if wt 3lb a day or 5lb a week weight gain Patient not taking: Reported on 09/25/2018 12/09/17   Almyra Deforest, PA  glucose blood (ONETOUCH VERIO) test strip Use as instructed to check blood sugar once a day dx code E11.9 02/16/16   Elayne Snare, MD  JARDIANCE 25 MG TABS tablet TAKE 1 TABLET BY MOUTH ONCE DAILY Patient not taking: Reported on 09/25/2018 01/02/18   Elayne Snare, MD  metFORMIN (GLUCOPHAGE-XR) 500 MG 24 hr tablet TAKE 4 TABLETS BY MOUTH DAILY WITH SUPPER Patient not taking: No sig reported 01/02/18   Elayne Snare, MD  [facility] DELICA LANCETS 64W MISC Use to check blood sugar once a day dx code E11.9 02/16/16   Elayne Snare, MD  pantoprazole (PROTONIX) 40 MG tablet Take 1 tablet (40 mg total) by mouth daily. Patient not taking: Reported on 09/25/2018 12/01/17   Eugenie Filler, MD  sildenafil (REVATIO) 20 MG tablet TAKE AS DIRECTED Patient not taking: Reported on 09/25/2018 09/26/17   Elayne Snare, MD    Physical Exam: Vitals:   09/25/18 2030 09/25/18 2045 09/25/18 2125 09/25/18 2139  BP: (!) 171/103 (!) 170/109 (!) 190/115   Pulse: 85 88 93   Resp: (!) 23 15 19    Temp:   (!) 97.4 F (36.3 C)   TempSrc:   Oral   SpO2: 98% 97% 97%   Weight:    103.7 kg  Height:    5' 10"  (1.778 m)    Physical Exam  Constitutional: He is oriented to person, place, and time. He appears well-developed and well-nourished. No distress.  Slightly diaphoretic Resting comfortably in a stretcher watching television  HENT:  Head: Normocephalic.  Mouth/Throat: Oropharynx is clear and moist.  Eyes: Right eye exhibits no discharge. Left eye exhibits no discharge.  Neck: Neck supple.  Cardiovascular: Intact distal pulses.  Tachycardic Irregularly irregular rhythm  Pulmonary/Chest: Effort normal and breath sounds normal. No respiratory distress. He has no wheezes. He has no rales.  Abdominal: Soft. Bowel sounds are normal. He exhibits no distension. There is no abdominal  tenderness. There is no guarding.  Musculoskeletal:        General: Edema present.     Comments: +1 bilateral pedal edema  Neurological: He is alert and oriented to person, place, and time.  Skin: Skin is warm. He is diaphoretic.  Psychiatric: He has a normal mood and affect. His behavior is normal.     Labs on Admission: I have personally reviewed following labs and imaging studies  CBC: Recent Labs  Lab 09/25/18 1540  WBC 5.3  NEUTROABS 3.2  HGB 19.0*  HCT 52.7*  MCV 91.3  PLT 803   Basic Metabolic Panel: Recent Labs  Lab 09/25/18 1540 09/25/18 1903  NA 138 137  K 3.6 3.8  CL 94* 98  CO2 27 24  GLUCOSE 434* 316*  BUN 9 10  CREATININE 1.09 1.03  CALCIUM 10.1 9.1  MG 1.6*  --    GFR: Estimated Creatinine Clearance: 97.8 mL/min (by C-G formula based on SCr of 1.03 mg/dL). Liver Function Tests: Recent Labs  Lab 09/25/18 1903  AST 35  ALT 34  ALKPHOS 49  BILITOT 1.9*  PROT 6.8  ALBUMIN 3.9   No results for input(s): LIPASE, AMYLASE in the last 168 hours. No results for input(s): AMMONIA in the last 168 hours. Coagulation Profile: Recent Labs  Lab 09/25/18 1540  INR 0.9   Cardiac Enzymes: Recent Labs  Lab 09/25/18 2143  TROPONINI 0.04*   BNP (last 3 results) No results for input(s): PROBNP in the last 8760 hours. HbA1C: Recent Labs    09/25/18 1855  HGBA1C 9.7*   CBG: Recent Labs  Lab 09/25/18 2134  GLUCAP 370*   Lipid Profile: No results for input(s): CHOL, HDL, LDLCALC, TRIG, CHOLHDL, LDLDIRECT in the last 72 hours. Thyroid Function Tests: Recent Labs    09/25/18 1519  TSH 1.383   Anemia Panel: No results for input(s): VITAMINB12, FOLATE, FERRITIN, TIBC, IRON, RETICCTPCT in the last 72 hours. Urine analysis:    Component Value Date/Time   COLORURINE YELLOW 11/27/2017 1330   APPEARANCEUR CLEAR 11/27/2017 1330   LABSPEC 1.023 11/27/2017 1330   PHURINE 7.0 11/27/2017 1330   GLUCOSEU >=500 (A) 11/27/2017 1330   HGBUR NEGATIVE  11/27/2017 1330   BILIRUBINUR NEGATIVE 11/27/2017 1330   BILIRUBINUR negative 04/28/2016 1153   KETONESUR 5 (A) 11/27/2017 1330   PROTEINUR NEGATIVE 11/27/2017 1330   UROBILINOGEN negative 04/28/2016 1153   NITRITE NEGATIVE 11/27/2017 1330   LEUKOCYTESUR NEGATIVE 11/27/2017 1330    Radiological Exams on Admission: Dg Chest Port 1 View  Result Date: 09/25/2018 CLINICAL DATA:  Shortness of breath EXAM: PORTABLE CHEST 1 VIEW COMPARISON:  January 04, 2018 FINDINGS: No edema or consolidation. There is slight right base atelectasis. Heart size and pulmonary vascularity are within normal limits. No adenopathy. No bone lesions. IMPRESSION: No edema or consolidation. Slight right base atelectasis. Stable cardiac silhouette. Electronically Signed   By: Lowella Grip III M.D.   On: 09/25/2018 16:04   Ct Angio Chest Aorta W/cm &/or Wo/cm  Result Date: 09/25/2018 CLINICAL DATA:  Elevated heart rate.  Chest pain.  Concern for PE. EXAM: CT ANGIOGRAPHY CHEST WITH CONTRAST TECHNIQUE: Multidetector CT imaging of the chest was performed using the standard protocol during bolus administration of intravenous contrast. Multiplanar CT image reconstructions and MIPs were obtained to evaluate the vascular anatomy. CONTRAST:  154m OMNIPAQUE IOHEXOL 350 MG/ML SOLN COMPARISON:  CT chest dated 11/29/2017 FINDINGS: Cardiovascular:  Again identified is a left-sided SVC. Mediastinum/Nodes: No enlarged mediastinal, hilar, or axillary lymph nodes. Thyroid gland, trachea, and esophagus demonstrate no significant findings. Lungs/Pleura: Lungs are clear. No pleural effusion or pneumothorax. Upper Abdomen: There is diffuse hepatic steatosis. There is some scarring of the upper pole the left kidney. Musculoskeletal: No chest wall abnormality. No acute or significant osseous findings. Review of the MIP images confirms the above findings. IMPRESSION: 1. No PE identified.  No dissection. 2. The lungs are clear. 3. Again noted is a  left-sided SVC, a normal variant. 4. Hepatic steatosis. Electronically Signed   By: CConstance HolsterM.D.   On: 09/25/2018 19:22    EKG: Independently reviewed.  A. fib with RVR (heart rate 179).  Assessment/Plan Principal Problem:   Atrial fibrillation with rapid  ventricular response (HCC) Active Problems:   Uncontrolled hypertension   Alcohol use   Hyperglycemia   Type 2 diabetes mellitus ([place])   New onset atrial fibrillation with rapid ventricular response Heart rate in the 180s on arrival.  TSH normal.  Troponin borderline elevated at 0.04.  Patient is not complaining of chest pain.  He was seen by cardiology.  He was started on Cardizem bolus and infusion and received doses of p.o. and IV metoprolol.  Currently rate controlled with heart rate in the 90s. -Cardiac monitoring -Echocardiogram -Appreciate cardiology recommendations -Continue Cardizem infusion -Continue beta-blocker -IV heparin infusion per cardiology recommendation. Confirmed by pharmacy.  -Trend troponin  Significantly elevated blood pressure, history of chronic hypertensive heart disease In the setting of medication nonadherence.  Patient has not taken any blood pressure medications for the past 10 days.  Blood pressure significantly elevated with systolic up to 932T and continue to be elevated even on maximum dose Cardizem infusion. Troponin borderline elevated at 0.04.  Patient is not complaining of chest pain.  CT angiogram negative for aortic dissection.  Patient was seen by cardiology and home blood pressure medications were resumed.  Labetalol was converted to metoprolol. -Monitor blood pressure closely -Continue Cardizem infusion -Continue amlodipine 5 mg daily -Continue Lasix 40 mg daily -Continue irbesartan 300 mg daily -Continue metoprolol tartrate 5 mg IV every 6 hours and metoprolol succinate 50 mg daily per cardiology recommendations- -Trend troponin  Hyperglycemia, history of type 2 diabetes In  the setting of medication nonadherence.  Patient has not taken his home oral diabetes medications for the past 10 days.  Blood glucose 434 on arrival.  Anion gap mildly elevated at 17, however, bicarb 27 not suggestive of DKA.  Patient received 1 L IV fluid bolus in the ED. -Give NovoLog 10 units -Check A1c -Sliding scale insulin moderate with bedtime coverage -CBG checks  Hypomagnesemia Magnesium 1.6 and repleted. -Continue to monitor  Polycythemia Hemoglobin 19.0.  Patient denies cigarette smoking. ?  Hemodilution no effect as platelet count is also slightly increased from baseline.  Received 1 L IV fluid bolus. -Repeat CBC in a.m. to confirm  Alcohol use -CIWA protocol; Ativan PRN -Thiamine, folate, multivitamin  DVT prophylaxis: Heparin Code Status: Full code Family Communication: No family available. Disposition Plan: Anticipate discharge in 1 to 2 days. Consults called: Cardiology Admission status: It is my clinical opinion that referral for OBSERVATION is reasonable and necessary in this patient based on the above information provided. The aforementioned taken together are felt to place the patient at high risk for further clinical deterioration. However it is anticipated that the patient may be medically stable for discharge from the hospital within 24 to 48 hours.  The medical decision making on this patient was of high complexity and the patient is at high risk for clinical deterioration, therefore this is a level 3 visit.  Shela Leff MD [facility] Pager [phone]  If 7PM-7AM, please contact night-coverage [email or web address] Password [facility]  09/25/2018, 11:57 PM

## 2018-09-25 NOTE — ED Notes (Addendum)
Pt is getting portable x-ray at bedside

## 2018-09-25 NOTE — ED Notes (Signed)
Cards at bedside

## 2018-09-25 NOTE — ED Provider Notes (Signed)
[facility] EMERGENCY DEPARTMENT Provider Note   CSN: [id number] Arrival date & time: 09/25/18  1502    History   Chief Complaint No chief complaint on file.   HPI Kyle Dawson is a 53 y.o. male.   HPI 53 year old male with a history of HTN and DM presents with A. fib with RVR.  Patient reports that for the past few days he has had intermittent episodes of sweating which he reports feeling predominantly in the top of his head.  Denies chest pain, shortness of breath, or palpitations.  Patient felt lightheaded after returning from lunch and had his blood pressure and heart rate checked.  He was found to be tachycardic and hypertensive.  His heart rate was in the 180s.  Patient came to the ED for further evaluation.  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 Authorizing Provider  amLODipine (NORVASC) 10 MG tablet Take 1 tablet (10 mg total) by mouth daily. 11/30/17   Eugenie Filler, MD  Ascorbic Acid (VITAMIN C PO) Take 1 tablet by mouth daily.     [provider]  aspirin EC 81 MG EC tablet Take 1 tablet (81 mg total) by mouth daily. 12/01/17   Eugenie Filler, MD  atorvastatin (LIPITOR) 10 MG tablet Take 1 tablet (10 mg total) by mouth daily at 6 PM. Patient taking differently: Take 10 mg by mouth daily.  11/30/17   Eugenie Filler, MD  B Complex Vitamins (B COMPLEX PO) Take 1 tablet by mouth daily.     [provider]  chlorthalidone (HYGROTON) 25 MG tablet Take 1 tablet (25 mg total) by mouth daily. 01/17/18 04/17/18  Fay Records, MD  colchicine 0.6 MG tablet Take 1 tablet (0.6 mg total) by mouth 2 (two) times daily. 11/30/17 02/28/18  Eugenie Filler, MD  furosemide (LASIX) 20 MG tablet Take on a as needed basis if wt 3lb a day or 5lb a week weight gain Patient taking differently: Take 40 mg by mouth daily. Take on a as needed basis if wt 3lb a day or 5lb a week weight gain 12/09/17   Almyra Deforest, PA  glucose blood (ONETOUCH VERIO) test strip Use as  instructed to check blood sugar once a day dx code E11.9 02/16/16   Elayne Snare, MD  hydrALAZINE (APRESOLINE) 25 MG tablet Take 25 mg by mouth 2 (two) times daily.    [provider]  ibuprofen (ADVIL,MOTRIN) 200 MG tablet Take 600 mg by mouth daily as needed for moderate pain.    [provider]  irbesartan (AVAPRO) 300 MG tablet Take 1 tablet (300 mg total) by mouth daily. 12/01/17   Eugenie Filler, MD  JARDIANCE 25 MG TABS tablet TAKE 1 TABLET BY MOUTH ONCE DAILY 01/02/18   Elayne Snare, MD  metFORMIN (GLUCOPHAGE-XR) 500 MG 24 hr tablet TAKE 4 TABLETS BY MOUTH DAILY WITH SUPPER Patient taking differently: Take 2,000 mg by mouth at bedtime.  01/02/18   Elayne Snare, MD  nebivolol 20 MG TABS Take 1 tablet (20 mg total) by mouth daily.  12/23/17   Fay Records, MD  NIACIN PO Take 1 tablet by mouth daily.    [provider]  Omega-3 Fatty Acids (FISH OIL PO) Take 1 tablet by mouth daily.     [provider]  Jonetta Speak LANCETS 63W MISC Use to check blood sugar once a day dx code E11.9 02/16/16   Elayne Snare, MD  pantoprazole (PROTONIX) 40 MG tablet Take 1 tablet (40 mg total) by mouth daily. 12/01/17   Eugenie Filler, MD  sildenafil (REVATIO) 20 MG tablet TAKE AS DIRECTED Patient taking differently: Take 1 tablet by mouth daily as needed for ed 09/26/17   Elayne Snare, MD    Family History Family History  Problem Relation Age of Onset   Peripheral vascular disease Mother 9   Hypertension Mother    Heart failure Mother    CAD Maternal Grandfather    Heart failure Paternal Grandfather    Hypertension Father     Social History Social History   Tobacco Use   Smoking status: Former Smoker   Smokeless tobacco: Never Used  Substance Use Topics   Alcohol use: Yes   Drug use: No     Allergies   Oxycodone   Review of Systems Review of Systems  Constitutional: Negative for chills and fever.  HENT: Negative for ear pain and sore throat.   Eyes: Negative for pain and visual disturbance.  Respiratory: Negative for cough and shortness of breath.   Cardiovascular: Negative for chest pain and palpitations.  Gastrointestinal: Negative for abdominal pain and vomiting.  Genitourinary: Negative for dysuria and hematuria.  Musculoskeletal: Negative for arthralgias and back pain.  Skin: Negative for color change and rash.  Neurological: Positive for light-headedness. Negative for seizures and syncope.  All other systems reviewed and are negative.    Physical Exam Updated Vital Signs BP (!) 186/157 (BP Location: Right Arm)    Pulse (!) 181    Temp 98.2 F (36.8 C) (Oral)    Resp (!) 24    Ht 5' 10"  (1.778 m)    Wt 105.2 kg    SpO2 98%    BMI 33.29 kg/m   Physical Exam Vitals signs  and nursing note reviewed.  Constitutional:      General: He is not in acute distress.    Appearance: He is well-developed. He is not toxic-appearing.  HENT:     Head: Normocephalic and atraumatic.  Eyes:     Extraocular Movements: Extraocular movements intact.     Conjunctiva/sclera: Conjunctivae normal.     Pupils: Pupils are equal, round, and reactive to light.  Neck:  Musculoskeletal: Neck supple.  Cardiovascular:     Rate and Rhythm: Tachycardia present. Rhythm irregular.     Heart sounds: No murmur.  Pulmonary:     Effort: Pulmonary effort is normal. No respiratory distress.     Breath sounds: Normal breath sounds.  Abdominal:     Palpations: Abdomen is soft.     Tenderness: There is no abdominal tenderness.  Musculoskeletal:        General: No swelling.     Right lower leg: No edema.     Left lower leg: No edema.  Skin:    General: Skin is warm and dry.  Neurological:     General: No focal deficit present.     Mental Status: He is alert and oriented to person, place, and time.      ED Treatments / Results  Labs (all labs ordered are listed, but only abnormal results are displayed) Labs Reviewed  BASIC METABOLIC PANEL  MAGNESIUM  TSH  BRAIN NATRIURETIC PEPTIDE  CBC WITH DIFFERENTIAL/PLATELET  APTT  PROTIME-INR    EKG EKG Interpretation  Date/Time:  Monday Sep 25 2018 15:07:54 EDT Ventricular Rate:  172 PR Interval:    QRS Duration: 98 QT Interval:  303 QTC Calculation: 513 R Axis:   -16 Text Interpretation:  Atrial fibrillation with rapid V-rate Borderline left axis deviation Low voltage, precordial leads ST depression, probably rate related Nonspecific ST and T wave abnormality AFIB IS NEW Confirmed by Varney Biles [phone]) on 09/25/2018 3:13:21 PM   Radiology No results found.  Procedures Procedures (including critical care time)  Medications Ordered in ED Medications - No data to display   Initial Impression / Assessment and Plan / ED  Course  I have reviewed the triage vital signs and the nursing notes.  Pertinent labs & imaging results that were available during my care of the patient were reviewed by me and considered in my medical decision making (see chart for details).  53 year old male with a history of HTN and DM presents with A. fib with RVR.  Patient tachycardic in the 170s to 180s on arrival.  Denies chest pain or shortness of breath.  Patient is hypotensive and mentating normally.  Patient thinks that he has been in and out of A. fib for longer than 48 hours and as he is not on systemic anticoagulation he is a high stroke risk with cardioversion.  Shared decision-making will rate control with diltiazem.  EKG shows A. fib with RVR. Rate 172.  Chest x-ray shows no edema or consolidation.  Slight right base atelectasis.  Stable cardiac silhouette.  Labs notable for INR 0.9.  CBC notable for hemoglobin 19.  BMP notable for chloride 94, glucose 434, CO2 27, anion gap 17.   NS bolus given.  Heparin started.  Patient remains severely hypertensive.  There is a 19 mmHg difference between the right upper and left upper extremity systolic blood pressures.  CTA of the chest abdomen pelvis performed which shows no PE or dissection.  He is maxed out on his diltiazem drip.  Cardiology consulted.  Patient admitted for further management.  Final Clinical Impressions(s) / ED Diagnoses   Final diagnoses:  None    ED Discharge Orders         Ordered    Amb referral to AFIB Clinic     09/25/18 1519           Trinidad Curet, MD 09/26/18 3267    Varney Biles, MD 09/26/18 2139

## 2018-09-26 ENCOUNTER — Observation Stay (HOSPITAL_BASED_OUTPATIENT_CLINIC_OR_DEPARTMENT_OTHER): Payer: 59

## 2018-09-26 ENCOUNTER — Encounter: Payer: Self-pay | Admitting: Interventional Cardiology

## 2018-09-26 DIAGNOSIS — Z885 Allergy status to narcotic agent status: Secondary | ICD-10-CM | POA: Diagnosis not present

## 2018-09-26 DIAGNOSIS — E1159 Type 2 diabetes mellitus with other circulatory complications: Secondary | ICD-10-CM | POA: Diagnosis not present

## 2018-09-26 DIAGNOSIS — I161 Hypertensive emergency: Secondary | ICD-10-CM

## 2018-09-26 DIAGNOSIS — Z7982 Long term (current) use of aspirin: Secondary | ICD-10-CM | POA: Diagnosis not present

## 2018-09-26 DIAGNOSIS — E1165 Type 2 diabetes mellitus with hyperglycemia: Secondary | ICD-10-CM | POA: Diagnosis not present

## 2018-09-26 DIAGNOSIS — E119 Type 2 diabetes mellitus without complications: Secondary | ICD-10-CM | POA: Diagnosis not present

## 2018-09-26 DIAGNOSIS — I4891 Unspecified atrial fibrillation: Secondary | ICD-10-CM | POA: Diagnosis not present

## 2018-09-26 DIAGNOSIS — Q261 Persistent left superior vena cava: Secondary | ICD-10-CM | POA: Insufficient documentation

## 2018-09-26 DIAGNOSIS — Z7289 Other problems related to lifestyle: Secondary | ICD-10-CM | POA: Diagnosis not present

## 2018-09-26 DIAGNOSIS — I119 Hypertensive heart disease without heart failure: Secondary | ICD-10-CM | POA: Diagnosis not present

## 2018-09-26 DIAGNOSIS — Z1159 Encounter for screening for other viral diseases: Secondary | ICD-10-CM | POA: Diagnosis not present

## 2018-09-26 DIAGNOSIS — Z79899 Other long term (current) drug therapy: Secondary | ICD-10-CM | POA: Diagnosis not present

## 2018-09-26 DIAGNOSIS — Z7901 Long term (current) use of anticoagulants: Secondary | ICD-10-CM | POA: Diagnosis not present

## 2018-09-26 DIAGNOSIS — Z85828 Personal history of other malignant neoplasm of skin: Secondary | ICD-10-CM | POA: Diagnosis not present

## 2018-09-26 DIAGNOSIS — I10 Essential (primary) hypertension: Secondary | ICD-10-CM | POA: Diagnosis not present

## 2018-09-26 LAB — BASIC METABOLIC PANEL
Anion gap: 18 — ABNORMAL HIGH (ref 5–15)
BUN: 11 mg/dL (ref 6–20)
CO2: 22 mmol/L (ref 22–32)
Calcium: 9.2 mg/dL (ref 8.9–10.3)
Chloride: 93 mmol/L — ABNORMAL LOW (ref 98–111)
Creatinine, Ser: 0.87 mg/dL (ref 0.61–1.24)
GFR calc Af Amer: >60 mL/min (ref >60)
GFR calc non Af Amer: >60 mL/min (ref >60)
Glucose, Bld: 215 mg/dL — ABNORMAL HIGH (ref 70–99)
Potassium: 3.3 mmol/L — ABNORMAL LOW (ref 3.5–5.1)
Sodium: 133 mmol/L — ABNORMAL LOW (ref 135–145)

## 2018-09-26 LAB — LIPID PANEL
Cholesterol: 236 mg/dL — ABNORMAL HIGH (ref 0–200)
HDL: 91 mg/dL (ref >40)
LDL Cholesterol: 122 mg/dL — ABNORMAL HIGH (ref 0–99)
Total CHOL/HDL Ratio: 2.6 RATIO
Triglycerides: 115 mg/dL (ref <150)
VLDL: 23 mg/dL (ref 0–40)

## 2018-09-26 LAB — ECHOCARDIOGRAM COMPLETE
Height: 70 in
Weight: 3657.87 oz

## 2018-09-26 LAB — CBC
HCT: 46.1 % (ref 39.0–52.0)
Hemoglobin: 17 g/dL (ref 13.0–17.0)
MCH: 33.1 pg (ref 26.0–34.0)
MCHC: 36.9 g/dL — ABNORMAL HIGH (ref 30.0–36.0)
MCV: 89.7 fL (ref 80.0–100.0)
Platelets: 199 10*3/uL (ref 150–400)
RBC: 5.14 MIL/uL (ref 4.22–5.81)
RDW: 11.8 % (ref 11.5–15.5)
WBC: 6.8 10*3/uL (ref 4.0–10.5)
nRBC: 0 % (ref 0.0–0.2)

## 2018-09-26 LAB — HEPARIN LEVEL (UNFRACTIONATED)
Heparin Unfractionated: 0.45 IU/mL (ref 0.30–0.70)
Heparin Unfractionated: 0.34 [IU]/mL (ref 0.30–0.70)

## 2018-09-26 LAB — MAGNESIUM: Magnesium: 1.7 mg/dL (ref 1.7–2.4)

## 2018-09-26 LAB — TROPONIN I
Troponin I: 0.03 ng/mL
Troponin I: <0.03 ng/mL (ref <0.03)

## 2018-09-26 LAB — GLUCOSE, CAPILLARY
Glucose-Capillary: 244 mg/dL — ABNORMAL HIGH (ref 70–99)
Glucose-Capillary: 279 mg/dL — ABNORMAL HIGH (ref 70–99)

## 2018-09-26 MED ORDER — RIVAROXABAN 20 MG PO TABS: 20.0000 mg | ORAL_TABLET | Freq: Every day | ORAL | 0 refills | Status: DC

## 2018-09-26 MED ORDER — ATORVASTATIN CALCIUM 10 MG PO TABS: 10.0000 mg | ORAL_TABLET | Freq: Every day | ORAL | 0 refills | Status: DC

## 2018-09-26 MED ORDER — METFORMIN HCL 1000 MG PO TABS: 1000.0000 mg | ORAL_TABLET | Freq: Two times a day (BID) | ORAL | 0 refills | Status: DC

## 2018-09-26 MED ORDER — FUROSEMIDE 40 MG PO TABS: 40.0000 mg | ORAL_TABLET | Freq: Every day | ORAL | 0 refills | Status: DC

## 2018-09-26 MED ORDER — METOPROLOL SUCCINATE ER 50 MG PO TB24: 50.0000 mg | ORAL_TABLET | Freq: Every day | ORAL | 0 refills | Status: DC

## 2018-09-26 MED ORDER — ADULT MULTIVITAMIN W/MINERALS CH: 1.0000 | ORAL_TABLET | Freq: Every day | ORAL | 0 refills | Status: DC

## 2018-09-26 MED ORDER — POTASSIUM CHLORIDE ER 10 MEQ PO TBCR: 10.0000 meq | EXTENDED_RELEASE_TABLET | Freq: Every day | ORAL | 0 refills | Status: DC

## 2018-09-26 MED ORDER — AMLODIPINE BESYLATE 10 MG PO TABS: 10.0000 mg | ORAL_TABLET | Freq: Every day | ORAL | 0 refills | Status: DC

## 2018-09-26 MED ORDER — MAGNESIUM SULFATE 2 GM/50ML IV SOLN
2.0000 g | Freq: Once | INTRAVENOUS | Status: AC
  Administered 2018-09-26: 09:00:00 2 g via INTRAVENOUS
  Filled 2018-09-26: qty 50

## 2018-09-26 MED ORDER — METOPROLOL TARTRATE 5 MG/5ML IV SOLN: 2.5000 mg | INTRAVENOUS | Status: DC | PRN

## 2018-09-26 MED ORDER — RIVAROXABAN 20 MG PO TABS
20.0000 mg | ORAL_TABLET | Freq: Every day | ORAL | Status: DC
  Administered 2018-09-26: 15:00:00 20 mg via ORAL
  Filled 2018-09-26: qty 1

## 2018-09-26 MED ORDER — FOLIC ACID 1 MG PO TABS: 1.0000 mg | ORAL_TABLET | Freq: Every day | ORAL | 0 refills | Status: DC

## 2018-09-26 MED ORDER — DILTIAZEM HCL ER COATED BEADS 180 MG PO CP24
360.0000 mg | ORAL_CAPSULE | Freq: Every day | ORAL | Status: DC
  Administered 2018-09-26: 12:00:00 360 mg via ORAL
  Filled 2018-09-26: qty 2

## 2018-09-26 MED ORDER — THIAMINE HCL 100 MG PO TABS: 100.0000 mg | ORAL_TABLET | Freq: Every day | ORAL | 0 refills | Status: DC

## 2018-09-26 MED ORDER — IRBESARTAN 300 MG PO TABS: 300.0000 mg | ORAL_TABLET | Freq: Every day | ORAL | 0 refills | Status: DC

## 2018-09-26 MED ORDER — AMLODIPINE BESYLATE 10 MG PO TABS: 10.0000 mg | ORAL_TABLET | Freq: Every day | ORAL | Status: DC

## 2018-09-26 MED ORDER — POTASSIUM CHLORIDE CRYS ER 20 MEQ PO TBCR
40.0000 meq | EXTENDED_RELEASE_TABLET | Freq: Once | ORAL | Status: AC
  Administered 2018-09-26: 08:00:00 40 meq via ORAL
  Filled 2018-09-26: qty 2

## 2018-09-26 MED FILL — ATORVASTATIN CALCIUM 20 MG: 20 | 30 days supply | Qty: 15 | Fill #0

## 2018-09-26 MED FILL — THIAMINE HCL 100 MG TABS: 100 | 60 days supply | Qty: 60 | Fill #0

## 2018-09-26 MED FILL — metFORMIN HCL 1000 MG TABS: 1000 | 30 days supply | Qty: 60 | Fill #0

## 2018-09-26 MED FILL — POTASSIUM CHL ER M10 TABLET: 10 | 14 days supply | Qty: 14 | Fill #0

## 2018-09-26 MED FILL — IRBESARTAN 300 MG TABLET: 300 | 30 days supply | Qty: 30 | Fill #0

## 2018-09-26 MED FILL — FOLIC ACID 1 MG TABS: 1 | 60 days supply | Qty: 60 | Fill #0

## 2018-09-26 MED FILL — AMLODIPINE BESYLATE 10 MG T: 10 | 60 days supply | Qty: 60 | Fill #0

## 2018-09-26 MED FILL — XARELTO 20 MG TABLET: 20 | 30 days supply | Qty: 30 | Fill #0

## 2018-09-26 MED FILL — METOPROLOL SUCCINATE ER 50: 50 | 30 days supply | Qty: 30 | Fill #0

## 2018-09-26 MED FILL — FUROSEMIDE 40 MG TABLET: 40 | 60 days supply | Qty: 60 | Fill #0

## 2018-09-26 NOTE — Progress Notes (Addendum)
The patient has been seen in conjunction with Rosaria Ferries, PA-C. All aspects of care have been considered and discussed. The patient has been personally interviewed, examined, and all clinical data has been reviewed.   The patient had spontaneous conversion from atrial fibrillation.  Moderate to severe LVH on echo consistent with hypertensive heart disease.  Blood pressure is elevated.  Plan is to institute anticoagulation therapy, Xarelto, in this patient with CHADS VASC greater than 2.  Discharge antihypertensive regimen: Amlodipine 10 mg/day, Irbesartan 300 mg/day, furosemide 40 mg daily, metoprolol 50 mg daily (can uptitrate as OP to 100 or 200 mg if tolerated by heart rate), and if blood pressure remains elevated with normal creatinine and potassium, consider adding low-dose Spironolactone 12.5 to 25 mg daily.  Hydralazine has been discontinued  Aspirin should be discontinued on Xarelto.  Other medications, atorvastatin, Jardiance, metformin, pantoprazole, sildenafil have not been changed.  Basic metabolic panel in 1 week and needs office visit for reassessment of blood pressure and potential up titration of therapy as noted above.   Progress Note  Patient Name: Kyle Dawson Date of Encounter: 09/26/2018  Primary Cardiologist:  Dorris Carnes, MD  Subjective   Feels well, never had palpitations, did not feel any different after he converted.  Prefers Xarelto or Eliquis to Coumadin  Inpatient Medications    Scheduled Meds:  amLODipine  5 mg Oral Daily   atorvastatin  10 mg Oral [id number]   folic acid  1 mg Oral Daily   furosemide  40 mg Oral Daily   insulin aspart  0-15 Units Subcutaneous TID WC   insulin aspart  0-5 Units Subcutaneous QHS   irbesartan  300 mg Oral Daily   metoprolol succinate  50 mg Oral Daily   metoprolol tartrate  5 mg Intravenous Q6H   multivitamin with minerals  1 tablet Oral Daily   sodium chloride flush  3 mL Intravenous Q12H    thiamine  100 mg Oral Daily   Continuous Infusions:  sodium chloride     diltiazem (CARDIZEM) infusion 15 mg/hr (09/26/18 0532)   heparin 1,300 Units/hr (09/25/18 2157)   PRN Meds: sodium chloride, acetaminophen, LORazepam, ondansetron (ZOFRAN) IV, sodium chloride flush   Vital Signs    Vitals:   09/26/18 0026 09/26/18 0505 09/26/18 0508 09/26/18 0746  BP: (!) 183/113 (!) 166/107 (!) 169/101 (!) 161/96  Pulse:  76 76 67  Resp:  19 19 11   Temp: 97.7 F (36.5 C)  97.6 F (36.4 C) 98.2 F (36.8 C)  TempSrc: Oral  Oral Oral  SpO2:  92% 95% 95%  Weight:      Height:        Intake/Output Summary (Last 24 hours) at 09/26/2018 1027 Last data filed at 09/26/2018 0748 Gross per 24 hour  Intake 2527.56 ml  Output 2025 ml  Net 502.56 ml   Filed Weights   09/25/18 1512 09/25/18 2139  Weight: 105.2 kg 103.7 kg   Last Weight  Most recent update: 09/25/2018  9:56 PM   Weight  103.7 kg (228 lb 9.9 oz)           Weight change: n/a   Telemetry    SR since on the floor (converted in the ER) - Personally Reviewed  ECG    05/18, Afib, RVR, HR 172, no acute ischemic changes - Personally Reviewed  Physical Exam   General: Well developed, well nourished, male appearing in no acute distress. Head: Normocephalic, atraumatic.  Neck: Supple without  bruits, JVD not elevated. Lungs:  Resp regular and unlabored, CTA. Heart: RRR, S1, S2, no S3, S4, or murmur; no rub. Abdomen: Soft, non-tender, non-distended with normoactive bowel sounds. No hepatomegaly. No rebound/guarding. No obvious abdominal masses. Extremities: No clubbing, cyanosis, no edema. Distal pedal pulses are 2+ bilaterally. Neuro: Alert and oriented X 3. Moves all extremities spontaneously. Psych: Normal affect.  Labs    Hematology Recent Labs  Lab 09/25/18 1540 09/26/18 0305  WBC 5.3 6.8  RBC 5.77 5.14  HGB 19.0* 17.0  HCT 52.7* 46.1  MCV 91.3 89.7  MCH 32.9 33.1  MCHC 36.1* 36.9*  RDW 11.9 11.8  PLT  223 199    Chemistry Recent Labs  Lab 09/25/18 1540 09/25/18 1903 09/26/18 0305  NA 138 137 133*  K 3.6 3.8 3.3*  CL 94* 98 93*  CO2 27 24 22   GLUCOSE 434* 316* 215*  BUN 9 10 11   CREATININE 1.09 1.03 0.87  CALCIUM 10.1 9.1 9.2  PROT  --  6.8  --   ALBUMIN  --  3.9  --   AST  --  35  --   ALT  --  34  --   ALKPHOS  --  49  --   BILITOT  --  1.9*  --   GFRNONAA >60 >60 >60  GFRAA >60 >60 >60  ANIONGAP 17* 15 18*     Cardiac Enzymes Recent Labs  Lab 09/25/18 2143 09/26/18 0305  TROPONINI 0.04* 0.03*      BNP Recent Labs  Lab 09/25/18 1540  BNP 42.9    Lab Results  Component Value Date   TSH 1.383 09/25/2018     Radiology    Dg Chest Port 1 View  Result Date: 09/25/2018 CLINICAL DATA:  Shortness of breath EXAM: PORTABLE CHEST 1 VIEW COMPARISON:  January 04, 2018 FINDINGS: No edema or consolidation. There is slight right base atelectasis. Heart size and pulmonary vascularity are within normal limits. No adenopathy. No bone lesions. IMPRESSION: No edema or consolidation. Slight right base atelectasis. Stable cardiac silhouette. Electronically Signed   By: Lowella Grip III M.D.   On: 09/25/2018 16:04   Ct Angio Chest Aorta W/cm &/or Wo/cm  Result Date: 09/25/2018 CLINICAL DATA:  Elevated heart rate.  Chest pain.  Concern for PE. EXAM: CT ANGIOGRAPHY CHEST WITH CONTRAST TECHNIQUE: Multidetector CT imaging of the chest was performed using the standard protocol during bolus administration of intravenous contrast. Multiplanar CT image reconstructions and MIPs were obtained to evaluate the vascular anatomy. CONTRAST:  122m OMNIPAQUE IOHEXOL 350 MG/ML SOLN COMPARISON:  CT chest dated 11/29/2017 FINDINGS: Cardiovascular:  Again identified is a left-sided SVC. Mediastinum/Nodes: No enlarged mediastinal, hilar, or axillary lymph nodes. Thyroid gland, trachea, and esophagus demonstrate no significant findings. Lungs/Pleura: Lungs are clear. No pleural effusion or  pneumothorax. Upper Abdomen: There is diffuse hepatic steatosis. There is some scarring of the upper pole the left kidney. Musculoskeletal: No chest wall abnormality. No acute or significant osseous findings. Review of the MIP images confirms the above findings. IMPRESSION: 1. No PE identified.  No dissection. 2. The lungs are clear. 3. Again noted is a left-sided SVC, a normal variant. 4. Hepatic steatosis. Electronically Signed   By: CConstance HolsterM.D.   On: 09/25/2018 19:22     Cardiac Studies   ECHO:  Performed, results pending  Patient Profile     53y.o. male w/ hx daily alcohol consumption, history of snoring, difficulty control hypertension, type 2 diabetes mellitus,  and no prior history of coronary disease was admitted 05/18 for new onset atrial fibrillation with RVR and HTN urgency.  Assessment & Plan    1. Atrial fib, RVR - spontaneous conversion to SR - asymptomatic before and after - will change Cardizem to 360 mg qd since currently at 15 mg/hr - continue BB - start anticoag w/ Xarelto per pharmacy - f/u on echo results - if echo ok, no further inpt cardiac workup  2. HTN:  - Feel he needs the [place] for the Afib, will leave to IM to decide if ok to remain on the amlodipine.  - needs good BP control - per IM  Otherwise, per IM Principal Problem:   Atrial fibrillation with rapid ventricular response (HCC) Active Problems:   Uncontrolled hypertension   Alcohol use   Hyperglycemia   Type 2 diabetes mellitus ([place])    Signed, Rosaria Ferries , PA-C 10:27 AM 09/26/2018 Pager: [phone]

## 2018-09-26 NOTE — Discharge Summary (Addendum)
Discharge Summary  Kyle Dawson KYH:[id number] DOB: 06-Sep-1965  PCP: Elby Showers, MD  Admit date: 09/25/2018 Discharge date: 09/26/2018  Time spent: 35 minutes  Recommendations for Outpatient Follow-up:  1. Follow-up with cardiology 2. Follow-up with primary care provider 3. Abstain from alcohol use  Discharge Diagnoses:  Active Hospital Problems   Diagnosis Date Noted   Atrial fibrillation with rapid ventricular response ([place]) 09/25/2018   Hypertensive emergency    Hyperglycemia 09/25/2018   Type 2 diabetes mellitus ([place]) 09/25/2018   Alcohol use 11/26/2017   Uncontrolled hypertension 11/09/2010    Resolved Hospital Problems  No resolved problems to display.    Discharge Condition: Stable  Diet recommendation: Heart healthy carb modified diet  Vitals:   09/26/18 1140 09/26/18 1433  BP: (!) 158/100 (!) 165/91  Pulse: 69 77  Resp: (!) 21 20  Temp:  (!) 97.5 F (36.4 C)  SpO2: 98% 98%    History of present illness:   Kyle Dawson is a 53 y.o. male with medical history significant of type 2 diabetes, hypertension, skin squamous cell carcinoma, and conditions listed below [facility] of A. fib with RVR.  Patient states he has been having intermittent "hot sweats" for the past 2 weeks.  He works at [employer] and wellness and while at work this afternoon this happened again.  He was sweating profusely.  His blood pressure was high with systolic in 151V and EKG done at the clinic revealed atrial fibrillation with rapid ventricular response.  Denies any associated chest pain, shortness of breath, dizziness, or heart palpitations.  Denies prior history of atrial fibrillation or thyroid problems.  He ran out of his blood pressure and diabetes medications 10 days ago.  He drinks 1-1/2 to 2 ounces of bourbon every night.  Last drink was yesterday night.  ED Course: On arrival, temperature 98.2  F, heart rate 181, respiratory rate 24, blood pressure 186/57, and SPO2 98% on room air.  TSH normal.  Blood glucose 434.  Bicarb 27 and anion gap 17.  Sodium 138, potassium 3.6.  BUN 9, creatinine 1.0.  Magnesium 1.6.  BNP 42.9.  White count 5.3, hemoglobin 19.0, platelet count 223.  INR 0.9.  COVID-19 rapid test negative.  Chest x-ray (personally reviewed) showing no edema or consolidation.  Slight right base atelectasis.  Stable cardiac silhouette.  CT angiogram without evidence of aortic dissection or PE, clear lungs.  Cardiology was consulted. Medications ordered in the ED: Cardizem bolus and infusion, Lasix 40 mg p.o., Lipitor 10 mg p.o., amlodipine 5 mg p.o., irbesartan 300 mg p.o., metoprolol succinate 50 mg p.o., and metoprolol tartrate 5 mg IV, 1 L IV fluid bolus, and IV magnesium sulfate 2 g.  Admitted with new onset A. fib with RVR.  09/26/18: Patient seen and examined at his bedside.  He denies any chest pain or dyspnea or palpitations.  Was seen by cardiology with adjustment of his medications.  Will need to completely abstain from alcohol use.  Will need to follow-up with cardiology outpatient.  On the day of discharge, the patient was hemodynamically stable.  He will need to follow-up with cardiology and his primary care provider posthospitalization.   Hospital Course:  Principal Problem:   Atrial fibrillation with rapid ventricular response (HCC) Active Problems:   Uncontrolled hypertension   Alcohol use   Hyperglycemia   Type 2 diabetes mellitus ([place])   Hypertensive emergency   New onset  atrial fibrillation with rapid ventricular response Heart rate in the 180s on arrival.  TSH normal.  Troponin borderline elevated at 0.04.  Patient is not complaining of chest pain.  He was seen by cardiology.  He was started on Cardizem bolus and infusion and received doses of p.o. and IV metoprolol.  Currently rate controlled with heart rate in the 90s. -Cardiac monitoring -Echocardiogram  revealed left ventricular hypertrophy with EF of 60-65% -Appreciate cardiology recommendations -CHADS VASC >2 -IV heparin dc this am and xarelto started for primary CVA prevention   Cardiology recommendations as follow:  Discharge antihypertensive regimen: Amlodipine 10 mg/day, Irbesartan 300 mg/day, furosemide 40 mg daily, metoprolol 50 mg daily (can uptitrate as OP to 100 or 200 mg if tolerated by heart rate), and if blood pressure remains elevated with normal creatinine and potassium, consider adding low-dose Spironolactone 12.5 to 25 mg daily.  Hydralazine has been discontinued  Aspirin should be discontinued on Xarelto.  Other medications, atorvastatin, Jardiance, metformin, pantoprazole, sildenafil have not been changed.  Basic metabolic panel in 1 week and needs office visit for reassessment of blood pressure and potential up titration of therapy as noted above.  Uncontrolled hypertension Significantly elevated blood pressure, history of chronic hypertensive heart disease In the setting of medication nonadherence.  Patient has not taken any blood pressure medications for the past 10 days.  Blood pressure significantly elevated with systolic up to 165B and continue to be elevated even on maximum dose Cardizem infusion. Troponin borderline elevated at 0.04.  Patient is not complaining of chest pain.  CT angiogram negative for aortic dissection.  Patient was seen by cardiology and home blood pressure medications were resumed. Blood pressure is improved Management as stated above Take medications as prescribed  Medication Non compliance Admits to not taking his medication for at least 1 week Will need to take medications as prescribed  Type 2 diabetes with hyperglycemia In the setting of medication nonadherence.  Patient has not taken his home oral diabetes medications for the past 10 days.  Blood glucose 434 on arrival.  Anion gap mildly elevated at 17, however, bicarb 27 not  suggestive of DKA.  Patient received 1 L IV fluid bolus in the ED. -Give NovoLog 10 units -Hemoglobin A1c 9.7 on 09/25/2018 -Restart metformin outpatient 1000 mg twice daily -Follow-up with your PCP  Hypomagnesemia Magnesium 1.6 and repleted. -Continue to monitor  Alcohol use disorder -Recommend Alcohol Anonymous -Thiamine, folate, multivitamin   Code Status: Full code  Consults called: Cardiology   Discharge Exam: BP (!) 165/91 (BP Location: Left Arm)    Pulse 77    Temp (!) 97.5 F (36.4 C) (Oral)    Resp 20    Ht 5' 10"  (1.778 m)    Wt 103.7 kg    SpO2 98%    BMI 32.80 kg/m   General: 53 y.o. year-old male well developed well nourished in no acute distress.  Alert and oriented x3.  Cardiovascular: Regular rate and rhythm with no rubs or gallops.  No thyromegaly or JVD noted.    Respiratory: Clear to auscultation with no wheezes or rales. Good inspiratory effort.  Abdomen: Soft nontender nondistended with normal bowel sounds x4 quadrants.  Musculoskeletal: Trace lower extremity edema. 2/4 pulses in all 4 extremities.  Psychiatry: Mood is appropriate for condition and setting  Discharge Instructions You were cared for by a hospitalist during your hospital stay. If you have any questions about your discharge medications or the care you received while you were  in the hospital after you are discharged, you can call the unit and asked to speak with the hospitalist on call if the hospitalist that took care of you is not available. Once you are discharged, your primary care physician will handle any further medical issues. Please note that NO REFILLS for any discharge medications will be authorized once you are discharged, as it is imperative that you return to your primary care physician (or establish a relationship with a primary care physician if you do not have one) for your aftercare needs so that they can reassess your need for medications and monitor your lab  values.  Discharge Instructions    Amb referral to AFIB Clinic   Complete by:  As directed      Allergies as of 09/26/2018      Reactions   Oxycodone Other (See Comments)   Hyper, sensitive to this      Medication List    STOP taking these medications   aspirin 81 MG EC tablet   hydrALAZINE 25 MG tablet Commonly known as:  APRESOLINE   Jardiance 25 MG Tabs tablet Generic drug:  empagliflozin   metFORMIN 500 MG 24 hr tablet Commonly known as:  GLUCOPHAGE-XR Replaced by:  metFORMIN 1000 MG tablet   Nebivolol HCl 20 MG Tabs   pantoprazole 40 MG tablet Commonly known as:  PROTONIX   sildenafil 20 MG tablet Commonly known as:  REVATIO     TAKE these medications   amLODipine 10 MG tablet Commonly known as:  NORVASC Take 1 tablet (10 mg total) by mouth daily.   atorvastatin 10 MG tablet Commonly known as:  LIPITOR Take 1 tablet (10 mg total) by mouth daily at 6 PM.   FISH OIL PO Take 1 tablet by mouth daily.   folic acid 1 MG tablet Commonly known as:  FOLVITE Take 1 tablet (1 mg total) by mouth daily. Start taking on:  Sep 27, 2018   furosemide 40 MG tablet Commonly known as:  LASIX Take 1 tablet (40 mg total) by mouth daily. Start taking on:  Sep 27, 2018 What changed:    medication strength  how much to take  how to take this  when to take this  additional instructions   glucose blood test strip Commonly known as:  OneTouch Verio Use as instructed to check blood sugar once a day dx code E11.9   irbesartan 300 MG tablet Commonly known as:  AVAPRO Take 1 tablet (300 mg total) by mouth daily.   metFORMIN 1000 MG tablet Commonly known as:  Glucophage Take 1 tablet (1,000 mg total) by mouth 2 (two) times daily with a meal. Replaces:  metFORMIN 500 MG 24 hr tablet   metoprolol succinate 50 MG 24 hr tablet Commonly known as:  TOPROL-XL Take 1 tablet (50 mg total) by mouth daily. Take with or immediately following a meal. Start taking on:  Sep 27, 2018   multivitamin with minerals Tabs tablet Take 1 tablet by mouth daily. Start taking on:  Sep 26, 628   OneTouch Delica Lancets 16W Misc Use to check blood sugar once a day dx code E11.9   potassium chloride 10 MEQ tablet Commonly known as:  K-DUR Take 1 tablet (10 mEq total) by mouth daily.   rivaroxaban 20 MG Tabs tablet Commonly known as:  XARELTO Take 1 tablet (20 mg total) by mouth daily with supper.   thiamine 100 MG tablet Take 1 tablet (100 mg total) by mouth daily.  Start taking on:  Sep 27, 2018      Allergies  Allergen Reactions   Oxycodone Other (See Comments)    Hyper, sensitive to this   Follow-up Information    Baxley, Cresenciano Lick, MD. Call in 1 day(s).   Specialty:  Internal Medicine Why:  Please call for a post hospital follow-up appointment. Contact information: [address] [address] [phone]        Fay Records, MD .   Specialty:  Cardiology Contact information: [address] [place] [address] [phone]            The results of significant diagnostics from this hospitalization (including imaging, microbiology, ancillary and laboratory) are listed below for reference.    Significant Diagnostic Studies: Dg Chest Port 1 View  Result Date: 09/25/2018 CLINICAL DATA:  Shortness of breath EXAM: PORTABLE CHEST 1 VIEW COMPARISON:  January 04, 2018 FINDINGS: No edema or consolidation. There is slight right base atelectasis. Heart size and pulmonary vascularity are within normal limits. No adenopathy. No bone lesions. IMPRESSION: No edema or consolidation. Slight right base atelectasis. Stable cardiac silhouette. Electronically Signed   By: Lowella Grip III M.D.   On: 09/25/2018 16:04   Ct Angio Chest Aorta W/cm &/or Wo/cm  Result Date: 09/25/2018 CLINICAL DATA:  Elevated heart rate.  Chest pain.  Concern for PE. EXAM: CT ANGIOGRAPHY CHEST WITH CONTRAST TECHNIQUE: Multidetector CT  imaging of the chest was performed using the standard protocol during bolus administration of intravenous contrast. Multiplanar CT image reconstructions and MIPs were obtained to evaluate the vascular anatomy. CONTRAST:  158m OMNIPAQUE IOHEXOL 350 MG/ML SOLN COMPARISON:  CT chest dated 11/29/2017 FINDINGS: Cardiovascular:  Again identified is a left-sided SVC. Mediastinum/Nodes: No enlarged mediastinal, hilar, or axillary lymph nodes. Thyroid gland, trachea, and esophagus demonstrate no significant findings. Lungs/Pleura: Lungs are clear. No pleural effusion or pneumothorax. Upper Abdomen: There is diffuse hepatic steatosis. There is some scarring of the upper pole the left kidney. Musculoskeletal: No chest wall abnormality. No acute or significant osseous findings. Review of the MIP images confirms the above findings. IMPRESSION: 1. No PE identified.  No dissection. 2. The lungs are clear. 3. Again noted is a left-sided SVC, a normal variant. 4. Hepatic steatosis. Electronically Signed   By: CConstance HolsterM.D.   On: 09/25/2018 19:22    Microbiology: Recent Results (from the past 240 hour(s))  SARS Coronavirus 2 (CEPHEID - Performed in [facility]), Hosp Order     Status: None   Collection Time: 09/25/18  3:50 PM  Result Value Ref Range Status   SARS Coronavirus 2 NEGATIVE NEGATIVE Final    Comment: (NOTE) If result is NEGATIVE SARS-CoV-2 target nucleic acids are NOT DETECTED. The SARS-CoV-2 RNA is generally detectable in upper and lower  respiratory specimens during the acute phase of infection. The lowest  concentration of SARS-CoV-2 viral copies this assay can detect is 250  copies / mL. A negative result does not preclude SARS-CoV-2 infection  and should not be used as the sole basis for treatment or other  patient management decisions.  A negative result may occur with  improper specimen collection / handling, submission of specimen other  than nasopharyngeal swab, presence  of viral mutation(s) within the  areas targeted by this assay, and inadequate number of viral copies  (<250 copies / mL). A negative result must be combined with clinical  observations, patient history, and epidemiological information. If result is POSITIVE  SARS-CoV-2 target nucleic acids are DETECTED. The SARS-CoV-2 RNA is generally detectable in upper and lower  respiratory specimens dur ing the acute phase of infection.  Positive  results are indicative of active infection with SARS-CoV-2.  Clinical  correlation with patient history and other diagnostic information is  necessary to determine patient infection status.  Positive results do  not rule out bacterial infection or co-infection with other viruses. If result is PRESUMPTIVE POSTIVE SARS-CoV-2 nucleic acids MAY BE PRESENT.   A presumptive positive result was obtained on the submitted specimen  and confirmed on repeat testing.  While 2019 novel coronavirus  (SARS-CoV-2) nucleic acids may be present in the submitted sample  additional confirmatory testing may be necessary for epidemiological  and / or clinical management purposes  to differentiate between  SARS-CoV-2 and other Sarbecovirus currently known to infect humans.  If clinically indicated additional testing with an alternate test  methodology [phone]) is advised. The SARS-CoV-2 RNA is generally  detectable in upper and lower respiratory sp ecimens during the acute  phase of infection. The expected result is Negative. Fact Sheet for Patients:  StrictlyIdeas.no Fact Sheet for Healthcare Providers: BankingDealers.co.za This test is not yet approved or cleared by the [place] FDA and has been authorized for detection and/or diagnosis of SARS-CoV-2 by FDA under an Emergency Use Authorization (EUA).  This EUA will remain in effect (meaning this test can be used) for the duration of the COVID-19 declaration under Section  564(b)(1) of the Act, 21 U.S.C. section 360bbb-3(b)(1), unless the authorization is terminated or revoked sooner. Performed at [facility], [place] [address], [place], [place] [address]      Labs: Basic Metabolic Panel: Recent Labs  Lab 09/25/18 1540 09/25/18 1903 09/26/18 0305  NA 138 137 133*  K 3.6 3.8 3.3*  CL 94* 98 93*  CO2 27 24 22   GLUCOSE 434* 316* 215*  BUN 9 10 11   CREATININE 1.09 1.03 0.87  CALCIUM 10.1 9.1 9.2  MG 1.6*  --  1.7   Liver Function Tests: Recent Labs  Lab 09/25/18 1903  AST 35  ALT 34  ALKPHOS 49  BILITOT 1.9*  PROT 6.8  ALBUMIN 3.9   No results for input(s): LIPASE, AMYLASE in the last 168 hours. No results for input(s): AMMONIA in the last 168 hours. CBC: Recent Labs  Lab 09/25/18 1540 09/26/18 0305  WBC 5.3 6.8  NEUTROABS 3.2  --   HGB 19.0* 17.0  HCT 52.7* 46.1  MCV 91.3 89.7  PLT 223 199   Cardiac Enzymes: Recent Labs  Lab 09/25/18 2143 09/26/18 0305 09/26/18 0943  TROPONINI 0.04* 0.03* <0.03   BNP: BNP (last 3 results) Recent Labs    11/26/17 1441 09/25/18 1540  BNP 117.9* 42.9    ProBNP (last 3 results) No results for input(s): PROBNP in the last 8760 hours.  CBG: Recent Labs  Lab 09/25/18 2134 09/26/18 0744 09/26/18 1136  GLUCAP 370* 244* 279*       Signed:  Kayleen Memos, MD [facility] 09/26/2018, 3:46 PM

## 2018-09-26 NOTE — Progress Notes (Addendum)
ANTICOAGULATION CONSULT NOTE - [place] for heparin Indication: atrial fibrillation  Allergies  Allergen Reactions  . Oxycodone Other (See Comments)    Hyper, sensitive to this    Patient Measurements: Height: 5' 10"  (177.8 cm) Weight: 228 lb 9.9 oz (103.7 kg) IBW/kg (Calculated) : 73 Heparin Dosing Weight: 95.4 kg  Vital Signs: Temp: 98.2 F (36.8 C) (05/19 0746) Temp Source: Oral (05/19 0746) BP: 161/96 (05/19 0746) Pulse Rate: 67 (05/19 0746)  Labs: Recent Labs    09/25/18 1540 09/25/18 1903 09/25/18 2143 09/26/18 0036 09/26/18 0305 09/26/18 0943  HGB 19.0*  --   --   --  17.0  --   HCT 52.7*  --   --   --  46.1  --   PLT 223  --   --   --  199  --   APTT 27  --   --   --   --   --   LABPROT 12.4  --   --   --   --   --   INR 0.9  --   --   --   --   --   HEPARINUNFRC  --   --   --  0.45  --  0.34  CREATININE 1.09 1.03  --   --  0.87  --   TROPONINI  --   --  0.04*  --  0.03*  --     Estimated Creatinine Clearance: 115.7 mL/min (by C-G formula based on SCr of 0.87 mg/dL).   Medical History: Past Medical History:  Diagnosis Date  . Basal cell carcinoma   . Chest pain   . Diabetes mellitus without complication ([place])   . Edema    lower extremity  . Hypertension   . Squamous cell carcinoma    R ear, nose, each side of face    Medications:  Scheduled:  . amLODipine  5 mg Oral Daily  . atorvastatin  10 mg Oral q1800  . folic acid  1 mg Oral Daily  . furosemide  40 mg Oral Daily  . insulin aspart  0-15 Units Subcutaneous TID WC  . insulin aspart  0-5 Units Subcutaneous QHS  . irbesartan  300 mg Oral Daily  . metoprolol succinate  50 mg Oral Daily  . metoprolol tartrate  5 mg Intravenous Q6H  . multivitamin with minerals  1 tablet Oral Daily  . sodium chloride flush  3 mL Intravenous Q12H  . thiamine  100 mg Oral Daily    Assessment: 89 yoF admitted with AFib RVR. No OAC noted at home prior to admit, started on IV  heparin which is therapeutic. HR now controlled in NSR, CBC stable.   Goal of Therapy:  Heparin level 0.3-0.7 units/ml Monitor platelets by anticoagulation protocol: Yes   Plan:  -Continue heparin 1300 units/hr -Daily heparin level and CBC   ADDENDUM: Pharmacy asked to transition to [place]. CrCl >50 ml/min.  Plan: -Continue IV heparin until 1400 then begin Xarelto 33m daily. -Pharmacy will sign off, reconsult if needed    MArrie Senate PharmD, BCPS Clinical Pharmacist [phone] check AMION for all [place]numbers 09/26/2018

## 2018-09-26 NOTE — TOC Benefit Eligibility Note (Signed)
Transition of Care [facility]) Benefit Eligibility Note    Patient Details  Name: Kyle Dawson MRN: [id number] Date of Birth: 08-01-1965   Medication/Dose: Alveda Reasons 20 MG DAILY  Covered?: Yes  Tier: 2 Drug  Prescription Coverage Preferred Pharmacy: [facility])  Spoke with Person/Company/Phone Number:: JAY([employer] RX # [phone])  Co-Pay: $ 113.60  Prior Approval: No  Deductible: Met       Memory Argue Phone Number: 09/26/2018, 1:14 PM

## 2018-09-26 NOTE — Progress Notes (Signed)
  Echocardiogram 2D Echocardiogram has been performed.  Matilde Bash 09/26/2018, 10:18 AM

## 2018-09-26 NOTE — Progress Notes (Signed)
ANTICOAGULATION CONSULT NOTE - Follow Up Consult  Pharmacy Consult for heparin Indication: atrial fibrillation  Labs: Recent Labs    09/25/18 1540 09/25/18 1903 09/25/18 2143 09/26/18 0036  HGB 19.0*  --   --   --   HCT 52.7*  --   --   --   PLT 223  --   --   --   APTT 27  --   --   --   LABPROT 12.4  --   --   --   INR 0.9  --   --   --   HEPARINUNFRC  --   --   --  0.45  CREATININE 1.09 1.03  --   --   TROPONINI  --   --  0.04*  --     Assessment/Plan:  53yo male therapeutic on heparin with initial dosing for Afib. Will continue gtt at current rate and confirm stable with additional level.   Wynona Neat, PharmD, BCPS  09/26/2018,2:57 AM

## 2018-10-05 ENCOUNTER — Telehealth: Payer: Self-pay | Admitting: *Deleted

## 2018-10-05 NOTE — Telephone Encounter (Signed)
PT CONSENTED TO USING DOXIMITY or [email or web address].ME - The patient will receive a link just prior to their visit by text.   Confirm consent - "In the setting of the current Covid19 crisis, you are scheduled for a (phone or video) visit with your provider on (date) at (time).  Just as we do with many in-office visits, in order for you to participate in this visit, we must obtain consent.  If you'd like, I can send this to your mychart (if signed up) or email for you to review.  Otherwise, I can obtain your verbal consent now.  All virtual visits are billed to your insurance company just like a normal visit would be.  By agreeing to a virtual visit, we'd like you to understand that the technology does not allow for your provider to perform an examination, and thus may limit your provider's ability to fully assess your condition. If your provider identifies any concerns that need to be evaluated in person, we will make arrangements to do so.  Finally, though the technology is pretty good, we cannot assure that it will always work on either your or our end, and in the setting of a video visit, we may have to convert it to a phone-only visit.  In either situation, we cannot ensure that we have a secure connection.  Are you willing to proceed?" STAFF: Did the patient verbally acknowledge consent to telehealth visit? Document YES/NO here: YES   TELEPHONE CALL NOTE  Kyle Dawson has been deemed a candidate for a follow-up tele-health visit to limit community exposure during the Covid-19 pandemic. I spoke with the patient via phone to ensure availability of phone/video source, confirm preferred email & phone number, and discuss instructions and expectations.  I reminded Kyle Dawson to be prepared with any vital sign and/or heart rhythm information that could potentially be obtained via home monitoring, at the time of his visit. I reminded Kyle Dawson to expect a phone call prior to his visit.  [name], [place] 10/05/2018 10:29 AM  FULL LENGTH CONSENT FOR TELE-HEALTH VISIT   I hereby voluntarily request, consent and authorize [facility] and its employed or contracted physicians, physician assistants, nurse practitioners or other licensed health care professionals (the Practitioner), to provide me with telemedicine health care services (the "Services") as deemed necessary by the treating Practitioner. I acknowledge and consent to receive the Services by the Practitioner via telemedicine. I understand that the telemedicine visit will involve communicating with the Practitioner through live audiovisual communication technology and the disclosure of certain medical information by electronic transmission. I acknowledge that I have been given the opportunity to request an in-person assessment or other available alternative prior to the telemedicine visit and am voluntarily participating in the telemedicine visit.  I understand that I have the right to withhold or withdraw my consent to the use of telemedicine in the course of my care at any time, without affecting my right to future care or treatment, and that the Practitioner or I may terminate the telemedicine visit at any time. I understand that I have the right to inspect all information obtained and/or recorded in the course of the telemedicine visit and may receive copies of available information for a reasonable fee.  I understand that some of the potential risks of receiving the Services via telemedicine include:  Marland Kitchen Delay or interruption in medical evaluation due to technological equipment failure or disruption; . Information transmitted may not be sufficient (e.g. poor  resolution of images) to allow for appropriate medical decision making by the Practitioner; and/or  . In rare instances, security protocols could fail, causing a breach of personal health information.  Furthermore, I acknowledge that it is my responsibility to provide information  about my medical history, conditions and care that is complete and accurate to the best of my ability. I acknowledge that Practitioner's advice, recommendations, and/or decision may be based on factors not within their control, such as incomplete or inaccurate data provided by me or distortions of diagnostic images or specimens that may result from electronic transmissions. I understand that the practice of medicine is not an exact science and that Practitioner makes no warranties or guarantees regarding treatment outcomes. I acknowledge that I will receive a copy of this consent concurrently upon execution via email to the email address I last provided but may also request a printed copy by calling the office of [place].    I understand that my insurance will be billed for this visit.   I have read or had this consent read to me. . I understand the contents of this consent, which adequately explains the benefits and risks of the Services being provided via telemedicine.  . I have been provided ample opportunity to ask questions regarding this consent and the Services and have had my questions answered to my satisfaction. . I give my informed consent for the services to be provided through the use of telemedicine in my medical care  By participating in this telemedicine visit I agree to the above.

## 2018-10-06 ENCOUNTER — Ambulatory Visit (INDEPENDENT_AMBULATORY_CARE_PROVIDER_SITE_OTHER): Payer: 59 | Admitting: Internal Medicine

## 2018-10-06 ENCOUNTER — Encounter: Payer: Self-pay | Admitting: Internal Medicine

## 2018-10-06 VITALS — BP 160/90 | HR 106 | Temp 98.7°F | Ht 71.0 in | Wt 223.0 lb

## 2018-10-06 DIAGNOSIS — Z8679 Personal history of other diseases of the circulatory system: Secondary | ICD-10-CM | POA: Diagnosis not present

## 2018-10-06 DIAGNOSIS — E119 Type 2 diabetes mellitus without complications: Secondary | ICD-10-CM

## 2018-10-06 DIAGNOSIS — Z87898 Personal history of other specified conditions: Secondary | ICD-10-CM

## 2018-10-06 DIAGNOSIS — Z7901 Long term (current) use of anticoagulants: Secondary | ICD-10-CM | POA: Diagnosis not present

## 2018-10-06 DIAGNOSIS — F329 Major depressive disorder, single episode, unspecified: Secondary | ICD-10-CM | POA: Diagnosis not present

## 2018-10-06 DIAGNOSIS — F419 Anxiety disorder, unspecified: Secondary | ICD-10-CM

## 2018-10-06 DIAGNOSIS — I10 Essential (primary) hypertension: Secondary | ICD-10-CM

## 2018-10-06 DIAGNOSIS — F32A Depression, unspecified: Secondary | ICD-10-CM

## 2018-10-06 MED ORDER — DIAZEPAM 5 MG PO TABS: 5.0000 mg | ORAL_TABLET | Freq: Two times a day (BID) | ORAL | 1 refills | Status: DC | PRN

## 2018-10-06 MED FILL — diazePAM 5 MG TABS: 5 | 30 days supply | Qty: 60 | Fill #0

## 2018-10-06 NOTE — Patient Instructions (Addendum)
Stop metformin due to diarrhea.Start Januvia 50 mg daily.  Prefer Benicar over Avapro for blood pressure control.  Valium 5 mg twice daily for anxiety.  Follow-up here in 1 week.  Continue to monitor blood pressure at home.

## 2018-10-06 NOTE — Progress Notes (Signed)
   Subjective:    Patient ID: Kyle Dawson, male    DOB: June 17, 1965, 53 y.o.   MRN: [id number]  HPI  53 year old Male here for hospitalization follow up.  He was admitted to [facility] long hospital May 18 through May 19 for atrial fibrillation with rapid ventricular response which responded to beta-blocker.  He has a longstanding history of hypertension and type 2 diabetes mellitus.  Apparently recently he has had an issue with alcohol drinking bourbon nightly.  Patient says he has not been honest with me in the past about how much alcohol he was drinking and that was likely contributing to his uncontrolled hypertension.  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  He has been there about a month or so.  He lives in [place].  He has a live-in  girlfriend.  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  Upon discharge recently he was placed on metformin but this is causing diarrhea and abdominal cramping.  He has follow-up with cardiologist on June 2.  During hospitalization TSH was normal.  His heart rate was in the 180s on arrival and troponin was borderline elevated.  He had no complaint of chest pain.  He was started on Cardizem and IV metoprolol.  He converted to sinus rhythm.  Echocardiogram revealed LVH with ejection fraction of 60 to 65%.  He was started on Xarelto.  He was discharged home on Lasix 40 mg daily, metoprolol 50 mg daily Xarelto, metformin, generic Protonix, atorvastatin, irbesartan 300 mg daily, amlodipine 10 mg daily  Says blood pressure readings have been significantly different in each arm.  He may have a subclavian steal syndrome.  However would not check his blood pressure at 644 systolically in each arm.  Review of Systems no new complaints     Objective:   Physical Exam  Skin warm and dry.  Chest clear to auscultation.  Cardiac exam regular rate and rhythm.  Extremities without pitting edema.  Affect is normal.       Assessment & Plan:  History of atrial fibrillation with rapid ventricular response now in sinus rhythm  Essential hypertension-would like to see better control  Alcohol use disorder-says he is not drinking  History of anxiety, depression and PTSD  Chronic anticoagulation with Xarelto  LVH  Type 2 diabetes mellitus-change metformin to Januvia  Plan: Discontinue metformin.  Januvia 50 mg daily.  Follow-up here in 1 week.  See cardiologist June 2.  Continue to monitor blood pressure.  I would prefer he take Benicar instead of Avapro.  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  Valium 5 mg twice daily for anxiety.  30 minutes spent with patient.

## 2018-10-09 NOTE — Progress Notes (Signed)
Virtual Visit via Telephone Note   This visit type was conducted due to national recommendations for restrictions regarding the COVID-19 Pandemic (e.g. social distancing) in an effort to limit this patient's exposure and mitigate transmission in our community.  Due to his co-morbid illnesses, this patient is at least at moderate risk for complications without adequate follow up.  This format is felt to be most appropriate for this patient at this time.  The patient did not have access to video technology/had technical difficulties with video requiring transitioning to audio format only (telephone).  All issues noted in this document were discussed and addressed.  No physical exam could be performed with this format.  Please refer to the patient's chart for his  consent to telehealth for [facility].   Date:  10/10/2018   ID:  Kyle Dawson, DOB March 28, 1966, MRN [id number]  Patient Location: Other:  Sitting in his car outside of work Provider Location: Home  PCP:  Elby Showers, MD  Cardiologist:  Dorris Carnes, MD   Electrophysiologist:  None   Evaluation Performed:  Follow-Up Visit  Chief Complaint: Posthospitalization follow-up; admitted for atrial fibrillation with rapid ventricular rate  History of Present Illness:    Kyle Dawson is a 53 y.o. male with coronary artery disease, hypertension, diabetes, prior TIA, GERD, pericardial effusion, alcohol abuse.  He was admitted in 11/2017 with chest pain and an Echo demonstrated normal EF with mild to mod pericardial effusion.  Coronary CTA demonstrated 50-75% mid LAD lesion that was neg by FFR.  He was last seen in clinic by Almyra Deforest, PA-C in the fall of 2019.  He was admitted 5/18-5/19 with atrial fibrillation with RVR.  He converted to normal sinus rhythm on rate controlling therapy.  CHADS2-VASc=5 (HTN, DM, prior TIA, CAD).  He was started on Rivaroxaban for anticoagulation.  His BP was significantly elevated and he had been  non-adherent to antihypertensive Rx.  His regimen was adjusted prior to DC.   Today, he notes he is doing well.  Prior to admission to the hospital, he had episodes of diaphoresis, markedly uncontrolled blood pressure and elevated heart rates.  He has had a couple of episodes of diaphoresis since discharge but no elevated heart rates.  He has been tolerating anticoagulation.  He did have some rectal bleeding on one occasion.  This sounds like hemorrhoidal bleeding.  He has not had any recurrences.  He has been monitoring his blood pressures.  They typically run 150s-160s/90s.  He is not had any chest discomfort or shortness of breath.  He is not any syncope but does get dizzy if he stands quickly.  He has not had any orthopnea, paroxysmal internal dyspnea lower extremity swelling.  He does note that his primary care provider is trying to arrange a sleep study.  The patient does not have symptoms concerning for COVID-19 infection (fever, chills, cough, or new shortness of breath).    Past Medical History:  Diagnosis Date  . Basal cell carcinoma   . Chest pain   . Diabetes mellitus without complication ([place])   . Edema    lower extremity  . Hypertension   . Squamous cell carcinoma    R ear, nose, each side of face   Past Surgical History:  Procedure Laterality Date  . Removal basal cell carcinoma    . Removal squamous cell carcinoma       Current Meds  Medication Sig  . amLODipine (NORVASC) 10 MG tablet Take 1  tablet (10 mg total) by mouth daily.  Marland Kitchen atorvastatin (LIPITOR) 10 MG tablet Take 1 tablet (10 mg total) by mouth daily at 6 PM.  . folic acid (FOLVITE) 1 MG tablet Take 1 tablet (1 mg total) by mouth daily.  . furosemide (LASIX) 40 MG tablet Take 1 tablet (40 mg total) by mouth daily.  Marland Kitchen glucose blood (ONETOUCH VERIO) test strip Use as instructed to check blood sugar once a day dx code E11.9  . irbesartan (AVAPRO) 300 MG tablet Take 1 tablet (300 mg total) by mouth daily.  .  metoprolol succinate (TOPROL-XL) 50 MG 24 hr tablet Take 1 tablet (50 mg total) by mouth daily. Take with or immediately following a meal.  . Multiple Vitamin (MULTIVITAMIN WITH MINERALS) TABS tablet Take 1 tablet by mouth daily.  . Omega-3 Fatty Acids (FISH OIL PO) Take 1 tablet by mouth daily.   Glory Rosebush DELICA LANCETS 32D MISC Use to check blood sugar once a day dx code E11.9  . rivaroxaban (XARELTO) 20 MG TABS tablet Take 1 tablet (20 mg total) by mouth daily with supper.  . sitaGLIPtin (JANUVIA) 50 MG tablet Take 50 mg by mouth daily.  Marland Kitchen thiamine 100 MG tablet Take 1 tablet (100 mg total) by mouth daily.  . [DISCONTINUED] potassium chloride (K-DUR) 10 MEQ tablet Take 1 tablet (10 mEq total) by mouth daily.     Allergies:   Oxycodone   Social History   Tobacco Use  . Smoking status: Former Research scientist (life sciences)  . Smokeless tobacco: Never Used  Substance Use Topics  . Alcohol use: Yes  . Drug use: No     Family Hx: The patient's family history includes CAD in his maternal grandfather; Heart failure in his mother and paternal grandfather; Hypertension in his father and mother; Peripheral vascular disease (age of onset: 29) in his mother.  ROS:   Please see the history of present illness.     All other systems reviewed and are negative.   Prior CV studies:   The following studies were reviewed today:  Echo 09/26/2018 EF 60-65, mod conc LVH, Gr 2 DD, normal RVSF, mild LAE  Chest CTA 09/25/2018 IMPRESSION: 1. No PE identified.  No dissection. 2. The lungs are clear. 3. Again noted is a left-sided SVC, a normal variant. 4. Hepatic steatosis.  Coronary CTA 11/29/17 Coronary Arteries: Right dominant with no anomalies LM: Less than 30% calcified disease in ostium LAD: 50-75% mixed plaque but heavily calcified in proximal and mid LAD D1: Small less than 30% mixed plaque proximally D2: 50-75% proximal stenosis calcified Circumflex: Less than 30% calcified stenosis in proximal and mid  vessel OM1: Small and normal OM2: Less than 30% proximal calcific stenosis OM3: Normal RCA: Less than 30% calcified stenosis in the proximal mid and distal vessel PDA: Normal PLA: Normal IMPRESSION: 1. Calcium score 838 which is 37 th percentile for age and sex 2. 50-75% heavily calcified stenosis in the proximal/mid LAD and D2 study will be sent for FFR CT 3.  Normal aortic root 3.7 cm FFR RCA:.95 LAD: Proximal .92 Mid .92 D1:.93 D2:.92 Circumflex:.93 IMPRESSION: No hemodynamically significant coronary artery lesions  Renal Art [place] 11/30/17 No evidence of RAS bilaterally  Echo 11/27/17 Mod LVH, EF 55-60, no RWMA, small to mod circumferential effusion  ABIs 07/22/14 Normal ABIs bilaterally    Labs/Other Tests and Data Reviewed:    EKG:  No ECG reviewed.  Recent Labs: 09/25/2018: ALT 34; B Natriuretic Peptide 42.9; TSH 1.383 09/26/2018: BUN  11; Creatinine, Ser 0.87; Hemoglobin 17.0; Magnesium 1.7; Platelets 199; Potassium 3.3; Sodium 133   Recent Lipid Panel Lab Results  Component Value Date/Time   CHOL 236 (H) 09/26/2018 03:05 AM   TRIG 115 09/26/2018 03:05 AM   HDL 91 09/26/2018 03:05 AM   CHOLHDL 2.6 09/26/2018 03:05 AM   LDLCALC 122 (H) 09/26/2018 03:05 AM     Wt Readings from Last 3 Encounters:  10/10/18 227 lb (103 kg)  10/06/18 223 lb (101.2 kg)  09/25/18 228 lb 9.9 oz (103.7 kg)     Objective:    Vital Signs:  BP (!) 165/101   Pulse 69   Temp 98.7 F (37.1 C)   Resp 17   Ht 5' 11"  (1.803 m)   Wt 227 lb (103 kg)   SpO2 96%   BMI 31.66 kg/m    VITAL SIGNS:  reviewed GEN:  no acute distress RESPIRATORY:  No labored breathing NEURO:  Alert and oriented PSYCH:  Normal mood  ASSESSMENT & PLAN:     Paroxysmal atrial fibrillation (HCC) He seems to be maintaining normal sinus rhythm.  He did have one episode of rectal bleeding.  This sounds like a hemorrhoid.  He has not had any recurrences.  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  He has not noticed any significant bleeding.  His primary care physician increased his beta-blocker last week for better blood pressure control.  Continue current dose of metoprolol succinate.  His blood pressure has been difficult to control.  With his recent development of atrial fibrillation, I am concerned he may have sleep apnea.  His primary care physician is trying to arrange a sleep study.  I agree with this.  Treating sleep apnea would reduce his risk of recurrent atrial fibrillation and also make his blood pressure easier to control.  I will adjust his blood pressure medications as outlined below.  Arrange follow-up BMET, CBC next week.  Hypertensive heart disease without heart failure As noted, his blood pressure is uncontrolled.  I will stop his potassium and place him on spironolactone 25 mg daily.  Continue all other medications the same.  Arrange BMET weekly x2.  He will continue to monitor his blood pressure and let us know if it is uncontrolled.  As noted, I agree with sleep study to rule out sleep apnea.  We discussed the importance of limiting his salt, increased activity and weight loss.  He had renal arterial Dopplers last year.  This was negative for renal artery stenosis.  If he continues have difficulty control blood pressure despite further efforts, we could consider 24-hour urine for catecholamines and metanephrines.  However, I doubt he has pheochromocytoma and do not recommend proceeding with a 24-hour urine at this point.  Coronary artery disease involving native coronary artery of native heart without angina pectoris Nonobstructive coronary disease by coronary CTA in July 2019.  He is not having anginal symptoms.  He is not on aspirin as he is on Rivaroxaban.  Continue statin therapy.  Type 2 diabetes mellitus with other circulatory complication, without long-term current use of insulin (HCC) Recent hemoglobin A1c 9.7.  Continue close follow-up with primary care.   COVID-19 Education: The signs and symptoms of COVID-19 were discussed with the patient and how to seek care for testing (follow up with PCP or arrange E-visit).  The importance of social distancing was discussed today.  Time:   Today, I have spent 20 minutes with the patient with  telehealth technology discussing the above problems.     Medication Adjustments/Labs and Tests Ordered: Current medicines are reviewed at length with the patient today.  Concerns regarding medicines are outlined above.   Tests Ordered: Orders Placed This Encounter  Procedures  . Basic metabolic panel  . Basic metabolic panel  . CBC    Medication Changes: Meds ordered this encounter  Medications  . spironolactone (ALDACTONE) 25 MG tablet    Sig: Take 1 tablet (25 mg total) by mouth daily.    Dispense:  30 tablet    Refill:  11    Please note, Potassium is stopped    Order Specific Question:   Supervising Provider    Answer:   Lelon Perla [[id number]]    Disposition:  Follow up in 4-6 week(s). With [employer]   Signed, Richardson Dopp, PA-C  10/10/2018 1:19 PM    [facility]

## 2018-10-10 ENCOUNTER — Other Ambulatory Visit: Payer: Self-pay

## 2018-10-10 ENCOUNTER — Encounter: Payer: Self-pay | Admitting: Physician Assistant

## 2018-10-10 ENCOUNTER — Telehealth (INDEPENDENT_AMBULATORY_CARE_PROVIDER_SITE_OTHER): Payer: 59 | Admitting: Physician Assistant

## 2018-10-10 VITALS — BP 165/101 | HR 69 | Temp 98.7°F | Resp 17 | Ht 71.0 in | Wt 227.0 lb

## 2018-10-10 DIAGNOSIS — I2510 Atherosclerotic heart disease of native coronary artery without angina pectoris: Secondary | ICD-10-CM

## 2018-10-10 DIAGNOSIS — I119 Hypertensive heart disease without heart failure: Secondary | ICD-10-CM | POA: Diagnosis not present

## 2018-10-10 DIAGNOSIS — E1159 Type 2 diabetes mellitus with other circulatory complications: Secondary | ICD-10-CM

## 2018-10-10 DIAGNOSIS — Z7189 Other specified counseling: Secondary | ICD-10-CM

## 2018-10-10 DIAGNOSIS — I480 Paroxysmal atrial fibrillation: Secondary | ICD-10-CM

## 2018-10-10 MED ORDER — SPIRONOLACTONE 25 MG PO TABS: 25.0000 mg | ORAL_TABLET | Freq: Every day | ORAL | 11 refills | Status: DC

## 2018-10-10 MED FILL — SPIRONOLACTONE 25 MG TABS: 25 | 30 days supply | Qty: 30 | Fill #0

## 2018-10-10 NOTE — Patient Instructions (Addendum)
Medication Instructions:   STOP TAKING POTASSIUM  START TAKING SPIRONLACTONE 25 MG ONCE  DAY   If you need a refill on your cardiac medications before your next appointment, please call your pharmacy.   Lab work: LAB TO BE REQUESTED TO BE DRAWN  (JOB)  BMET AND CBC  IN ONE WEEK  REPEAT BMET TWO WEEKS  If you have labs (blood work) drawn today and your tests are completely normal, you will receive your results only by: Marland Kitchen MyChart Message (if you have MyChart) OR . A paper copy in the mail If you have any lab test that is abnormal or we need to change your treatment, we will call you to review the results.  Testing/Procedures: NONE ORDERED  TODAY   Follow-Up: At [facility], you and your health needs are our priority.  As part of our continuing mission to provide you with exceptional heart care, we have created designated Provider Care Teams.  These Care Teams include your primary Cardiologist (physician) and Advanced Practice Providers (APPs -  Physician Assistants and Nurse Practitioners) who all work together to provide you with the care you need, when you need it. You will need a follow up appointment in:  4-6 weeks.  Please call our office 2 months in advance to schedule this appointment.  You may see Dorris Carnes, MD or one of the following Advanced Practice Providers on your designated Care Team: Richardson Dopp, PA-C [place], [place] . Daune Perch, NP  Any Other Special Instructions Will Be Listed Below (If Applicable).

## 2018-10-11 ENCOUNTER — Other Ambulatory Visit: Payer: Self-pay | Admitting: Endocrinology

## 2018-10-13 ENCOUNTER — Ambulatory Visit (INDEPENDENT_AMBULATORY_CARE_PROVIDER_SITE_OTHER): Payer: 59 | Admitting: Internal Medicine

## 2018-10-13 ENCOUNTER — Encounter: Payer: Self-pay | Admitting: Internal Medicine

## 2018-10-13 VITALS — BP 130/90 | HR 64 | Ht 71.0 in | Wt 226.0 lb

## 2018-10-13 DIAGNOSIS — Z7901 Long term (current) use of anticoagulants: Secondary | ICD-10-CM | POA: Diagnosis not present

## 2018-10-13 DIAGNOSIS — Z8679 Personal history of other diseases of the circulatory system: Secondary | ICD-10-CM

## 2018-10-13 DIAGNOSIS — E119 Type 2 diabetes mellitus without complications: Secondary | ICD-10-CM | POA: Diagnosis not present

## 2018-10-13 DIAGNOSIS — F419 Anxiety disorder, unspecified: Secondary | ICD-10-CM

## 2018-10-13 DIAGNOSIS — Z87898 Personal history of other specified conditions: Secondary | ICD-10-CM | POA: Diagnosis not present

## 2018-10-13 DIAGNOSIS — I10 Essential (primary) hypertension: Secondary | ICD-10-CM

## 2018-10-13 DIAGNOSIS — F329 Major depressive disorder, single episode, unspecified: Secondary | ICD-10-CM | POA: Diagnosis not present

## 2018-10-13 DIAGNOSIS — F32A Depression, unspecified: Secondary | ICD-10-CM

## 2018-10-13 MED ORDER — IRBESARTAN 300 MG PO TABS: 300.0000 mg | ORAL_TABLET | Freq: Every day | ORAL | 5 refills | Status: DC

## 2018-10-13 MED ORDER — METOPROLOL SUCCINATE ER 50 MG PO TB24: 50.0000 mg | ORAL_TABLET | Freq: Two times a day (BID) | ORAL | 5 refills | Status: DC

## 2018-10-13 MED ORDER — AMLODIPINE BESYLATE 10 MG PO TABS: 10.0000 mg | ORAL_TABLET | Freq: Every day | ORAL | 5 refills | Status: DC

## 2018-10-13 MED ORDER — RIVAROXABAN 20 MG PO TABS: 20.0000 mg | ORAL_TABLET | Freq: Every day | ORAL | 0 refills | Status: DC

## 2018-10-13 MED ORDER — FUROSEMIDE 40 MG PO TABS: 40.0000 mg | ORAL_TABLET | Freq: Every day | ORAL | 5 refills | Status: DC

## 2018-10-13 MED FILL — XARELTO 20 MG TABLET: 20 | 30 days supply | Qty: 30 | Fill #0

## 2018-10-13 NOTE — Patient Instructions (Addendum)
Physical exam scheduled for August 2020.  He will be seeing Dr. Dwyane Dee in the near future regarding diabetic control.  He is not taking Valium.  Cardiology just started him on Spironolactone.  Blood pressure is stable.  Continue medications as previously prescribed.  Currently on Januvia 100 mg daily.

## 2018-10-13 NOTE — Progress Notes (Signed)
   Subjective:    Patient ID: Kyle Dawson, male    DOB: 1965/09/29, 53 y.o.   MRN: [id number]  HPI 53 year old Male with non-insulin-dependent diabetes mellitus, hypertension, history of recent paroxysmal atrial fibrillation with conversion using medication only, history of alcohol use disorder for follow-up.  He saw Cardiology PA, Richardson Dopp, recently.  He was placed on spironolactone on June 2.  He says blood pressures been under better control with the addition of this medication.  Says Accu-Cheks are still running high and we are going to increase Januvia to 100 mg daily.  Says he needs physical exam in the near future for insurance purposes which is being required by his employer.  We scheduled physical exam for July.  Work is going well.    Review of Systems no new complaints     Objective:   Physical Exam Blood pressure 130/90 pulse 64 pulse oximetry 97% weight 226 pounds BMI 31.52       Assessment & Plan:  Essential hypertension  BMI 31  Alcohol use disorder  Paroxysmal atrial fibrillation on chronic anticoagulation  Hyperlipidemia  Controlled type 2 diabetes mellitus  Plan: He has follow-up visit scheduled with cardiology PA and Dr. Dwyane Dee.  I will see him again in early August.  He will continue to work on diet exercise and weight loss.  He knows he can call if he has any concerns or questions.  Have not made any change in his medications.

## 2018-10-17 MED FILL — METOPROLOL SUCCINATE ER 50: 50 | 30 days supply | Qty: 60 | Fill #0

## 2018-10-18 DIAGNOSIS — I10 Essential (primary) hypertension: Secondary | ICD-10-CM | POA: Diagnosis not present

## 2018-10-19 LAB — BASIC METABOLIC PANEL
BUN / CREAT RATIO: 19 (ref 9–20)
BUN: 23 mg/dL (ref 6–24)
CALCIUM: 10.5 mg/dL — AB (ref 8.7–10.2)
CO2: 27 mmol/L (ref 20–29)
CREATININE: 1.2 mg/dL (ref 0.76–1.27)
Chloride: 95 mmol/L — ABNORMAL LOW (ref 96–106)
GFR calc non Af Amer: 68 mL/min/{1.73_m2} (ref >59)
GFR, EST AFRICAN AMERICAN: 78 mL/min/{1.73_m2} (ref >59)
Glucose: 266 mg/dL — ABNORMAL HIGH (ref 65–99)
POTASSIUM: 4.2 mmol/L (ref 3.5–5.2)
Sodium: 139 mmol/L (ref 134–144)

## 2018-10-20 ENCOUNTER — Other Ambulatory Visit: Payer: 59

## 2018-10-20 MED FILL — IRBESARTAN 300 MG TABLET: 300 | 30 days supply | Qty: 30 | Fill #0

## 2018-10-22 NOTE — Progress Notes (Signed)
Patient ID: Kyle Dawson, male   DOB: Feb 04, 1966, 53 y.o.   MRN: [id number]            Reason for Appointment: Follow-up for Type 2 Diabetes  Referring physician: [place]   History of Present Illness:          Date of diagnosis of type 2 diabetes mellitus: ?  2015        Background history:   Although he thinks his blood sugars have been borderline in the past his labs include glucose readings of 183 and 315, 2 years ago He thinks he had a normal A1c in 2016 but no records are available this A1c was 6.7 in 2015.   When first seen the patient was not on any pharmacological therapy for his poorly controlled diabetes and A1c of 12%  Recent history:        His A1c is significantly higher at 9.7, has been as low as 6.3  He has not been seen in follow-up since 02/2017 when his A1c was 6.8  Non-insulin hypoglycemic drugs the patient is taking are: Januvia 50 mg daily  Previously on Jardiance 25 mg daily, metformin ER 1500 mg daily  Current management, blood sugar patterns and problems identified:  He says that he stopped taking metformin probably a year ago since he was having GI symptoms with this  He also has not taken his Jardiance since about the beginning of the year  His blood sugars have been progressively higher for at least 2 months  Recently has been started on Januvia 50 mg daily from his PCP  However his blood sugars are as high as 434 in the hospital last month he does not think he has any excessive thirst, urination, fatigue or visual changes  As before he has difficulty losing weight  No side effects from Jardiance previously  Glucose monitoring:  done less than 1 times a day         Glucometer:  One Touch Verio IQ   PRE-MEAL Fasting Lunch Dinner Bedtime Overall  Glucose range:  201-365    245-345   Mean/median:  305   246   277     Self-care: The diet that the patient has been following is: tries to limit high-fat foods and fast food .      Typical  meal intake: Breakfast is oatmeal               Dietician visit, most recent: None               Exercise: Walking 60 minutes, 4 times a week  Weight history:  Previous maximum 265  Wt Readings from Last 3 Encounters:  10/23/18 228 lb 12.8 oz (103.8 kg)  10/13/18 226 lb (102.5 kg)  10/10/18 227 lb (103 kg)    Glycemic control:   Lab Results  Component Value Date   HGBA1C 9.7 (H) 09/25/2018   HGBA1C 7.5 (H) 11/27/2017   HGBA1C 6.8 (H) 02/23/2017   Lab Results  Component Value Date   MICROALBUR 1.0 02/23/2017   LDLCALC 122 (H) 09/26/2018   CREATININE 1.20 10/18/2018   Lab Results  Component Value Date   MICRALBCREAT 1.2 02/23/2017     OTHER active problems: See review of systems   Allergies as of 10/23/2018      Reactions   Oxycodone Other (See Comments)   Hyper, sensitive to this      Medication List       Accurate as  of October 23, 2018  3:34 PM. If you have any questions, ask your nurse or doctor.        amLODipine 10 MG tablet Commonly known as: NORVASC Take 1 tablet (10 mg total) by mouth daily.   atorvastatin 10 MG tablet Commonly known as: LIPITOR Take 1 tablet (10 mg total) by mouth daily at 6 PM.   diazepam 5 MG tablet Commonly known as: VALIUM Take 1 tablet (5 mg total) by mouth every 12 (twelve) hours as needed for anxiety.   FISH OIL PO Take 1 tablet by mouth daily.   folic acid 1 MG tablet Commonly known as: FOLVITE Take 1 tablet (1 mg total) by mouth daily.   furosemide 40 MG tablet Commonly known as: LASIX Take 1 tablet (40 mg total) by mouth daily.   glucose blood test strip Commonly known as: OneTouch Verio Use as instructed to check blood sugar once a day dx code E11.9   irbesartan 300 MG tablet Commonly known as: AVAPRO Take 1 tablet (300 mg total) by mouth daily.   metoprolol succinate 50 MG 24 hr tablet Commonly known as: TOPROL-XL Take 1 tablet (50 mg total) by mouth 2 (two) times a day. Take with or immediately  following a meal.   multivitamin with minerals Tabs tablet Take 1 tablet by mouth daily.   OneTouch Delica Lancets 54G Misc Use to check blood sugar once a day dx code E11.9   rivaroxaban 20 MG Tabs tablet Commonly known as: XARELTO Take 1 tablet (20 mg total) by mouth daily with supper.   Rybelsus 3 MG Tabs Generic drug: Semaglutide Take 3 mg by mouth daily before breakfast. Started by: Elayne Snare, MD   sitaGLIPtin 50 MG tablet Commonly known as: JANUVIA Take 50 mg by mouth daily.   spironolactone 25 MG tablet Commonly known as: ALDACTONE Take 1 tablet (25 mg total) by mouth daily.   thiamine 100 MG tablet Take 1 tablet (100 mg total) by mouth daily.       Allergies:  Allergies  Allergen Reactions  . Oxycodone Other (See Comments)    Hyper, sensitive to this    Past Medical History:  Diagnosis Date  . Basal cell carcinoma   . Chest pain   . Diabetes mellitus without complication ([place])   . Edema    lower extremity  . Hypertension   . Squamous cell carcinoma    R ear, nose, each side of face    Past Surgical History:  Procedure Laterality Date  . Removal basal cell carcinoma    . Removal squamous cell carcinoma      Family History  Problem Relation Age of Onset  . Peripheral vascular disease Mother 74  . Hypertension Mother   . Heart failure Mother   . CAD Maternal Grandfather   . Heart failure Paternal Grandfather   . Hypertension Father     Social History:  reports that he has quit smoking. He has never used smokeless tobacco. He reports current alcohol use. He reports that he does not use drugs.   Review of Systems  Gastrointestinal: Negative for diarrhea.  Genitourinary:       Nocturia 1-2 times  Neurological: Positive for balance difficulty. Negative for numbness and tingling.       He can walk in the morning but during the day may have some gait instability     Lipid history: on Lipitor 80 mg  previously and now only on 10 mg  Lab Results  Component Value Date   CHOL 236 (H) 09/26/2018   HDL 91 09/26/2018   LDLCALC 122 (H) 09/26/2018   TRIG 115 09/26/2018   CHOLHDL 2.6 09/26/2018            Hypertension:Since 1996, On amlodipine 10 mg, Bystolic 5 mg and Avapro 654 Appears to have been difficult to control  Followed by PCP Lasix started when he was in the hospital potentially to call directly edema from increased amlodipine  Blood pressure checked regularly at home: Recently about 130/80  BP Readings from Last 3 Encounters:  10/23/18 122/84  10/13/18 130/90  10/10/18 (!) 165/101     Most recent foot exam: 10/18     Physical Examination:  BP 122/84 (BP Location: Left Arm, Patient Position: Sitting, Cuff Size: Normal)   Pulse 88   Ht 5' 11"  (1.803 m)   Wt 228 lb 12.8 oz (103.8 kg)   SpO2 96%   BMI 31.91 kg/m         ASSESSMENT:  Diabetes type 2, BMI 32  His A1c is markedly increased at 9.7  See history of present illness for detailed discussion of current diabetes management, blood sugar patterns and problems identified  Previously when taking metformin and Jardiance he had fairly good control although not optimal and also had issues with continued obesity He is trying to do fairly well with his lifestyle with exercise but may not be always doing well with diet  Although he is unable to tolerate metformin and does not want to try it again he can still benefit from [place] Since he has a high cardiovascular risk and history of cerebrovascular disease needs additional medications with benefits of weight loss and risk reduction  HYPERTENSION: Better controlled with increased amlodipine  Lipids: Followed by PCP and recently had high levels, just starting Lipitor  PLAN:    Discussed with the patient the nature of GLP-1 drugs, the action on various organ systems, improved satiety, how they benefit blood glucose control, as well as the benefit of weight loss.  Explained possible side  effects of RYBELSUS, most commonly nausea that usually improves over time; discussed safety information in package insert.  Given patient detailed information on dosage titration, possible side effects and manufacturer support information as well as co-pay card He will take this in the morning 30-minute before breakfast To start with 3 mg dosage weekly for the first 4 weeks and then reassess dose  Also discussed action of SGLT 2 drugs on lowering glucose by decreasing kidney absorption of glucose, benefits of weight loss and lower blood pressure, possible side effects including candidiasis and dosage regimen   He is going to start back on Jardiance 25 mg daily in the morning He will at the same time leave off his Lasix and take this only as needed to avoid potential decreased renal function.  Also likely will not have edema while taking Jardiance He will continue Januvia but increase the dose to 100 mg which is more appropriate for his renal function, may not need this long-term Balanced meals with some protein and low normal fat and carbohydrate content  Discussed importance of checking blood sugars at various times and progression of diabetes Also discussed possibility of going on insulin if above medications do not bring sugars down effectively  Follow-up in 6 weeks  Patient Instructions  Check blood sugars on waking up 4 days a week  Also check blood sugars about 2 hours after meals and do this  after different meals by rotation  Recommended blood sugar levels on waking up are 90-130 and about 2 hours after meal is 130-160  Please bring your blood sugar monitor to each visit, thank you  Lasix 46m or as needed with jardiance  Januvia 1013m  Counseling time on subjects discussed in assessment and plan sections is over 50% of today's 25 minute visit    AjElayne Snare/15/2020, 3:34 PM   Note: This office note was prepared with Dragon voice recognition system technology. Any  transcriptional errors that result from this process are unintentional.

## 2018-10-23 ENCOUNTER — Encounter: Payer: Self-pay | Admitting: Endocrinology

## 2018-10-23 ENCOUNTER — Other Ambulatory Visit: Payer: Self-pay

## 2018-10-23 ENCOUNTER — Ambulatory Visit: Payer: 59 | Admitting: Endocrinology

## 2018-10-23 VITALS — BP 122/84 | HR 88 | Ht 71.0 in | Wt 228.8 lb

## 2018-10-23 DIAGNOSIS — E1165 Type 2 diabetes mellitus with hyperglycemia: Secondary | ICD-10-CM

## 2018-10-23 MED ORDER — EMPAGLIFLOZIN 25 MG PO TABS: 25.0000 mg | ORAL_TABLET | Freq: Every day | ORAL | 1 refills | Status: DC

## 2018-10-23 MED ORDER — RYBELSUS 3 MG PO TABS: 3.0000 mg | ORAL_TABLET | Freq: Every day | ORAL | 0 refills | Status: DC

## 2018-10-23 MED FILL — JARDIANCE 25 MG TABLET: 25 | 30 days supply | Qty: 30 | Fill #0

## 2018-10-23 MED FILL — RYBELSUS 3 MG TABS: 3 | 30 days supply | Qty: 30 | Fill #0

## 2018-10-23 NOTE — Patient Instructions (Addendum)
Check blood sugars on waking up 4 days a week  Also check blood sugars about 2 hours after meals and do this after different meals by rotation  Recommended blood sugar levels on waking up are 90-130 and about 2 hours after meal is 130-160  Please bring your blood sugar monitor to each visit, thank you  Lasix 22m or as needed with jardiance  Januvia 104m

## 2018-10-25 ENCOUNTER — Other Ambulatory Visit: Payer: Self-pay | Admitting: Physician Assistant

## 2018-10-25 ENCOUNTER — Other Ambulatory Visit: Payer: Self-pay | Admitting: Endocrinology

## 2018-10-25 DIAGNOSIS — I119 Hypertensive heart disease without heart failure: Secondary | ICD-10-CM | POA: Diagnosis not present

## 2018-10-25 DIAGNOSIS — E1165 Type 2 diabetes mellitus with hyperglycemia: Secondary | ICD-10-CM | POA: Diagnosis not present

## 2018-10-25 DIAGNOSIS — I480 Paroxysmal atrial fibrillation: Secondary | ICD-10-CM | POA: Diagnosis not present

## 2018-10-26 LAB — CBC
Hematocrit: 44 % (ref 37.5–51.0)
Hemoglobin: 15.7 g/dL (ref 13.0–17.7)
MCH: 32.4 pg (ref 26.6–33.0)
MCHC: 35.7 g/dL (ref 31.5–35.7)
MCV: 91 fL (ref 79–97)
Platelets: 220 10*3/uL (ref 150–450)
RBC: 4.84 x10E6/uL (ref 4.14–5.80)
RDW: 12.3 % (ref 11.6–15.4)
WBC: 5.7 10*3/uL (ref 3.4–10.8)

## 2018-10-26 LAB — FRUCTOSAMINE: Fructosamine: 401 umol/L — ABNORMAL HIGH (ref 0–285)

## 2018-10-26 LAB — BASIC METABOLIC PANEL
BUN/Creatinine Ratio: 19 (ref 9–20)
BUN: 25 mg/dL — ABNORMAL HIGH (ref 6–24)
CO2: 22 mmol/L (ref 20–29)
Calcium: 10.1 mg/dL (ref 8.7–10.2)
Chloride: 94 mmol/L — ABNORMAL LOW (ref 96–106)
Creatinine, Ser: 1.35 mg/dL — ABNORMAL HIGH (ref 0.76–1.27)
GFR calc Af Amer: 68 mL/min/{1.73_m2} (ref >59)
GFR calc non Af Amer: 59 mL/min/{1.73_m2} — ABNORMAL LOW (ref >59)
Glucose: 159 mg/dL — ABNORMAL HIGH (ref 65–99)
Potassium: 4.5 mmol/L (ref 3.5–5.2)
Sodium: 135 mmol/L (ref 134–144)

## 2018-10-31 ENCOUNTER — Telehealth: Payer: Self-pay | Admitting: Physician Assistant

## 2018-10-31 ENCOUNTER — Other Ambulatory Visit: Payer: Self-pay | Admitting: Endocrinology

## 2018-10-31 DIAGNOSIS — I10 Essential (primary) hypertension: Secondary | ICD-10-CM

## 2018-10-31 MED ORDER — FUROSEMIDE 40 MG PO TABS: 20.0000 mg | ORAL_TABLET | Freq: Every day | ORAL | 5 refills | Status: DC

## 2018-10-31 NOTE — Telephone Encounter (Signed)
Pt  Aware of recommendations and has already been taking Lasix 20 mg once a day Since June 15  Dr. Dwyane Dee recommended.   Patient will have BMET drawn on Job Friday  [facility]

## 2018-10-31 NOTE — Telephone Encounter (Signed)
New Message    Pt is returning a call about his labs. He said there is suppose to be a change in his medication as well     Please call

## 2018-11-01 MED FILL — FREESTYLE LANCETS: 90 days supply | Qty: 100 | Fill #0

## 2018-11-01 MED FILL — FREESTYLE LITE TEST STRIP: 90 days supply | Qty: 100 | Fill #0

## 2018-11-03 ENCOUNTER — Other Ambulatory Visit: Payer: Self-pay | Admitting: Physician Assistant

## 2018-11-03 DIAGNOSIS — I10 Essential (primary) hypertension: Secondary | ICD-10-CM | POA: Diagnosis not present

## 2018-11-03 MED FILL — SPIRONOLACTONE 25 MG TABS: 25 | 30 days supply | Qty: 30 | Fill #1

## 2018-11-04 LAB — BASIC METABOLIC PANEL
BUN/Creatinine Ratio: 17 (ref 9–20)
BUN: 27 mg/dL — ABNORMAL HIGH (ref 6–24)
CO2: 24 mmol/L (ref 20–29)
Calcium: 9.5 mg/dL (ref 8.7–10.2)
Chloride: 94 mmol/L — ABNORMAL LOW (ref 96–106)
Creatinine, Ser: 1.56 mg/dL — ABNORMAL HIGH (ref 0.76–1.27)
GFR calc Af Amer: 57 mL/min/{1.73_m2} — ABNORMAL LOW (ref >59)
GFR calc non Af Amer: 49 mL/min/{1.73_m2} — ABNORMAL LOW (ref >59)
Glucose: 188 mg/dL — ABNORMAL HIGH (ref 65–99)
Potassium: 3.8 mmol/L (ref 3.5–5.2)
Sodium: 137 mmol/L (ref 134–144)

## 2018-11-06 ENCOUNTER — Telehealth: Payer: Self-pay | Admitting: Physician Assistant

## 2018-11-06 ENCOUNTER — Other Ambulatory Visit: Payer: Self-pay | Admitting: *Deleted

## 2018-11-06 DIAGNOSIS — H524 Presbyopia: Secondary | ICD-10-CM | POA: Diagnosis not present

## 2018-11-06 NOTE — Telephone Encounter (Signed)
New Message     Left message to confirm appt Consent in Epic

## 2018-11-06 NOTE — Progress Notes (Signed)
Virtual Visit via Telephone Note   This visit type was conducted due to national recommendations for restrictions regarding the COVID-19 Pandemic (e.g. social distancing) in an effort to limit this patient's exposure and mitigate transmission in our community.  Due to his co-morbid illnesses, this patient is at least at moderate risk for complications without adequate follow up.  This format is felt to be most appropriate for this patient at this time.  The patient did not have access to video technology/had technical difficulties with video requiring transitioning to audio format only (telephone).  All issues noted in this document were discussed and addressed.  No physical exam could be performed with this format.  Please refer to the patient's chart for his  consent to telehealth for [facility].   Date:  11/07/2018   ID:  MCCORMICK MACON, DOB 01/18/1966, MRN [id number]  Patient Location: Other:  work Provider Location: Office  PCP:  Elby Showers, MD  Cardiologist:  Dorris Carnes, MD   Electrophysiologist:  None   Evaluation Performed:  Follow-Up Visit  Chief Complaint:  FU on AFib, HTN, CAD  History of Present Illness:    Kyle Dawson is a 53 y.o. male with:  Coronary artery disease   Coronary CTA 7/19:  50-75% mid LAD lesion that was neg by FFR  AFib  Admitted 5/19 w/ AF with RVR  CHADS2-VASc=5 (HTN, DM, Prior TIA, CAD) >>Xarelto   Hypertension   Diabetes Mellitus  Prior TIA  GERD  Pericardial Effusion  Echo in 7/19 with mod effusion  Alcohol abuse  He was last seen by me 10/10/2018 for post hospital follow up.  His BP was uncontrolled.  I placed him on Spironolactone.  His BP improved but, unfortunately, his creatinine increased.  I stopped his Spironolactone yesterday.  Today, he notes he is doing well.  He stopped drinking alcohol about 25 days ago.  He has also been walking about a little over 2 miles a day for exercise.  He has lose 8 lbs.  He feels  good.  He is doing community service for a DUI.  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  He has not had chest pain, shortness of breath, syncope.  He has had a little bit of ankle edema.  He had some palpitations on Sat but these were short lived.  He otherwise has not felt like he has had recurrent atrial fibrillation.    The patient does not have symptoms concerning for COVID-19 infection (fever, chills, cough, or new shortness of breath).    Past Medical History:  Diagnosis Date  . Basal cell carcinoma   . Chest pain   . Diabetes mellitus without complication ([place])   . Edema    lower extremity  . Hypertension   . Squamous cell carcinoma    R ear, nose, each side of face   Past Surgical History:  Procedure Laterality Date  . Removal basal cell carcinoma    . Removal squamous cell carcinoma       Current Meds  Medication Sig  . amLODipine (NORVASC) 10 MG tablet Take 1 tablet (10 mg total) by mouth daily.  . B Complex Vitamins (VITAMIN B COMPLEX PO) Take 1 tablet by mouth daily.  . COD LIVER OIL PO Take by mouth daily.  . Docosahexaenoic Acid (DHA PO) Take by mouth at bedtime.  . empagliflozin (JARDIANCE) 25 MG TABS tablet Take 25 mg by mouth daily.  . folic  acid (FOLVITE) 1 MG tablet Take 1 tablet (1 mg total) by mouth daily.  . furosemide (LASIX) 40 MG tablet Take 0.5 tablets (20 mg total) by mouth daily.  . irbesartan (AVAPRO) 300 MG tablet Take 1 tablet (300 mg total) by mouth daily.  . metoprolol succinate (TOPROL-XL) 50 MG 24 hr tablet Take 1 tablet (50 mg total) by mouth 2 (two) times a day. Take with or immediately following a meal.  . Multiple Vitamin (MULTIVITAMIN WITH MINERALS) TABS tablet Take 1 tablet by mouth daily.  . Omega-3 Fatty Acids (EPA PO) Take by mouth at bedtime.  . Omega-3 Fatty Acids (FISH OIL PO) Take 1 tablet by mouth daily.   Glory Rosebush Delica Lancets 14G MISC USE TO CHECK BLOOD SUGAR ONCE DAILY  . ONETOUCH VERIO test strip  USE AS DIRECTED TO CHECK BLOOD SUGAR ONCE DAILY  . rivaroxaban (XARELTO) 20 MG TABS tablet Take 1 tablet (20 mg total) by mouth daily with supper.  . Semaglutide (RYBELSUS) 3 MG TABS Take 3 mg by mouth daily before breakfast.  . sitaGLIPtin (JANUVIA) 50 MG tablet Take 50 mg by mouth daily.  Marland Kitchen thiamine 100 MG tablet Take 1 tablet (100 mg total) by mouth daily.     Allergies:   Oxycodone   Social History   Tobacco Use  . Smoking status: Former Research scientist (life sciences)  . Smokeless tobacco: Never Used  Substance Use Topics  . Alcohol use: Yes  . Drug use: No     Family Hx: The patient's family history includes CAD in his maternal grandfather; Heart failure in his mother and paternal grandfather; Hypertension in his father and mother; Peripheral vascular disease (age of onset: 51) in his mother.  ROS:   Please see the history of present illness.    All other systems reviewed and are negative.   Prior CV studies:   The following studies were reviewed today:  Echo 09/26/2018 EF 60-65, mod conc LVH, Gr 2 DD, normal RVSF, mild LAE  Chest CTA 09/25/2018 IMPRESSION: 1. No PE identified.  No dissection. 2. The lungs are clear. 3. Again noted is a left-sided SVC, a normal variant. 4. Hepatic steatosis.   Coronary CTA 11/29/17 Coronary Arteries: Right dominant with no anomalies LM: Less than 30% calcified disease in ostium LAD: 50-75% mixed plaque but heavily calcified in proximal and mid LAD D1: Small less than 30% mixed plaque proximally D2: 50-75% proximal stenosis calcified Circumflex: Less than 30% calcified stenosis in proximal and mid vessel OM1: Small and normal OM2: Less than 30% proximal calcific stenosis OM3: Normal RCA: Less than 30% calcified stenosis in the proximal mid and distal vessel PDA: Normal PLA: Normal IMPRESSION: 1. Calcium score 838 which is 54 th percentile for age and sex 2. 50-75% heavily calcified stenosis in the proximal/mid LAD and D2 study will be sent for FFR CT  3.  Normal aortic root 3.7 cm FFR RCA:.95 LAD: Proximal .92 Mid .92 D1:.93 D2:.92 Circumflex:.93 IMPRESSION: No hemodynamically significant coronary artery lesions   Renal Art [place] 11/30/17 No evidence of RAS bilaterally   Echo 11/27/17 Mod LVH, EF 55-60, no RWMA, small to mod circumferential effusion   ABIs 07/22/14 Normal ABIs bilaterally   Labs/Other Tests and Data Reviewed:    EKG:  No ECG reviewed.  Recent Labs: 09/25/2018: ALT 34; B Natriuretic Peptide 42.9; TSH 1.383 09/26/2018: Magnesium 1.7 10/25/2018: Hemoglobin 15.7; Platelets 220 11/03/2018: BUN 27; Creatinine, Ser 1.56; Potassium 3.8; Sodium 137   Recent Lipid Panel Lab Results  Component Value Date/Time   CHOL 236 (H) 09/26/2018 03:05 AM   TRIG 115 09/26/2018 03:05 AM   HDL 91 09/26/2018 03:05 AM   CHOLHDL 2.6 09/26/2018 03:05 AM   LDLCALC 122 (H) 09/26/2018 03:05 AM    Wt Readings from Last 3 Encounters:  11/07/18 220 lb 3.2 oz (99.9 kg)  10/23/18 228 lb 12.8 oz (103.8 kg)  10/13/18 226 lb (102.5 kg)     Objective:    Vital Signs:  BP 127/84   Pulse 82   Ht 5' 11"  (1.803 m)   Wt 220 lb 3.2 oz (99.9 kg)   BMI 30.71 kg/m    VITAL SIGNS:  reviewed GEN:  no acute distress RESPIRATORY:  No labored breathing NEURO:  Alert and oriented PSYCH:  Normal mood  ASSESSMENT & PLAN:    1. Hypertensive heart disease without heart failure His blood pressure is much better controlled.  However, his creatinine has increased since starting on spironolactone.  Therefore, I have stopped his spironolactone.  He will get another BMET letter this week to follow-up.  Hopefully with his increased exercise and weight loss, his blood pressure will be much better controlled.  If his pressure starts to increase above target (130/80) we will start him on hydralazine.  2. Paroxysmal atrial fibrillation (HCC) No apparent recurrence.  He seems to be tolerating anticoagulation with Rivaroxaban.  3. Coronary artery disease  involving native coronary artery of native heart without angina pectoris Nonobstructive coronary artery disease by coronary CTA in 2019.  He is not on aspirin as he is on Rivaroxaban.  Continue atorvastatin.  4. Snores His primary care physician is arranging sleep testing.  5. AKI (acute kidney injury) ([place]) As noted, spironolactone has been discontinued.  He will have a follow-up BMET later this week to re-check his creatinine.  Time:   Today, I have spent 17 minutes with the patient with telehealth technology discussing the above problems.     Medication Adjustments/Labs and Tests Ordered: Current medicines are reviewed at length with the patient today.  Concerns regarding medicines are outlined above.   Tests Ordered: Orders Placed This Encounter  Procedures  . Basic metabolic panel    Medication Changes: Meds ordered this encounter  Medications  . atorvastatin (LIPITOR) 10 MG tablet    Sig: Take 1 tablet (10 mg total) by mouth daily.    Dispense:  90 tablet    Refill:  3    Follow Up:  In Person in 3 month(s) with me or Dr. Harrington Challenger   Signed, Richardson Dopp, PA-C  11/07/2018 5:42 PM    [facility]

## 2018-11-07 ENCOUNTER — Encounter: Payer: Self-pay | Admitting: Physician Assistant

## 2018-11-07 ENCOUNTER — Other Ambulatory Visit: Payer: Self-pay

## 2018-11-07 ENCOUNTER — Telehealth (INDEPENDENT_AMBULATORY_CARE_PROVIDER_SITE_OTHER): Payer: 59 | Admitting: Physician Assistant

## 2018-11-07 VITALS — BP 127/84 | HR 82 | Ht 71.0 in | Wt 220.2 lb

## 2018-11-07 DIAGNOSIS — I119 Hypertensive heart disease without heart failure: Secondary | ICD-10-CM | POA: Diagnosis not present

## 2018-11-07 DIAGNOSIS — I480 Paroxysmal atrial fibrillation: Secondary | ICD-10-CM

## 2018-11-07 DIAGNOSIS — R0683 Snoring: Secondary | ICD-10-CM

## 2018-11-07 DIAGNOSIS — I2510 Atherosclerotic heart disease of native coronary artery without angina pectoris: Secondary | ICD-10-CM

## 2018-11-07 DIAGNOSIS — N179 Acute kidney failure, unspecified: Secondary | ICD-10-CM

## 2018-11-07 MED ORDER — ATORVASTATIN CALCIUM 10 MG PO TABS: 10.0000 mg | ORAL_TABLET | Freq: Every day | ORAL | 3 refills | Status: DC

## 2018-11-07 MED FILL — ATORVASTATIN 10 MG TABLET: 10 | 90 days supply | Qty: 90 | Fill #0

## 2018-11-07 NOTE — Patient Instructions (Addendum)
Medication Instructions:   Hold Lasix, Avapro, Jardiance x 1 day, then resume.   Start taking Lipitor 10 mg once a day    If you need a refill on your cardiac medications before your next appointment, please call your pharmacy.   Lab work: Lab work [facility]  Thursday 11-09-2018  If you have labs (blood work) drawn today and your tests are completely normal, you will receive your results only by: Marland Kitchen MyChart Message (if you have MyChart) OR . A paper copy in the mail If you have any lab test that is abnormal or we need to change your treatment, we will call you to review the results.  Testing/Procedures: NONE ORDERED  TODAY  Follow-Up: At [facility], you and your health needs are our priority.  As part of our continuing mission to provide you with exceptional heart care, we have created designated Provider Care Teams.  These Care Teams include your primary Cardiologist (physician) and Advanced Practice Providers (APPs -  Physician Assistants and Nurse Practitioners) who all work together to provide you with the care you need, when you need it. You will need a follow up appointment in:  3 months.   You may see Dorris Carnes, MD or one of the following Advanced Practice Providers on your designated Care Team: Richardson Dopp, PA-C [place], [place] . Daune Perch, NP  Any Other Special Instructions Will Be Listed Below (If Applicable). Please call if blood pressure is running above 130 /80 consisitently

## 2018-11-09 ENCOUNTER — Other Ambulatory Visit: Payer: Self-pay | Admitting: Physician Assistant

## 2018-11-09 DIAGNOSIS — I119 Hypertensive heart disease without heart failure: Secondary | ICD-10-CM | POA: Diagnosis not present

## 2018-11-09 DIAGNOSIS — C130 Malignant neoplasm of postcricoid region: Secondary | ICD-10-CM | POA: Diagnosis not present

## 2018-11-10 LAB — BASIC METABOLIC PANEL
BUN/Creatinine Ratio: 14 (ref 9–20)
BUN: 18 mg/dL (ref 6–24)
CO2: 23 mmol/L (ref 20–29)
Calcium: 9.7 mg/dL (ref 8.7–10.2)
Chloride: 95 mmol/L — ABNORMAL LOW (ref 96–106)
Creatinine, Ser: 1.25 mg/dL (ref 0.76–1.27)
GFR calc Af Amer: 74 mL/min/{1.73_m2} (ref >59)
GFR calc non Af Amer: 64 mL/min/{1.73_m2} (ref >59)
Glucose: 164 mg/dL — ABNORMAL HIGH (ref 65–99)
Potassium: 4.7 mmol/L (ref 3.5–5.2)
Sodium: 137 mmol/L (ref 134–144)

## 2018-11-13 MED FILL — METOPROLOL SUCCINATE ER 50: 50 | 30 days supply | Qty: 60 | Fill #1

## 2018-11-13 MED FILL — FUROSEMIDE 40 MG TAB: 40 | 60 days supply | Qty: 60 | Fill #0

## 2018-11-13 MED FILL — AMLODIPINE BESYLATE 10 MG T: 10 | 60 days supply | Qty: 60 | Fill #0

## 2018-11-16 ENCOUNTER — Other Ambulatory Visit: Payer: Self-pay

## 2018-11-16 MED ORDER — RYBELSUS 3 MG PO TABS: 3.0000 mg | ORAL_TABLET | Freq: Every day | ORAL | 0 refills | Status: DC

## 2018-11-16 MED FILL — RYBELSUS 3 MG TABS: 3 | 30 days supply | Qty: 30 | Fill #0

## 2018-11-16 MED FILL — JARDIANCE 25 MG TABLET: 25 | 30 days supply | Qty: 30 | Fill #1

## 2018-11-16 MED FILL — IRBESARTAN 300 MG TABLET: 300 | 30 days supply | Qty: 30 | Fill #1

## 2018-11-20 MED FILL — FUROSEMIDE 20 MG TABS: 20 | 30 days supply | Qty: 30 | Fill #1

## 2018-11-20 MED FILL — diazePAM 5 MG TABS: 5 | 30 days supply | Qty: 60 | Fill #1

## 2018-11-27 ENCOUNTER — Other Ambulatory Visit: Payer: Self-pay

## 2018-11-27 MED ORDER — SITAGLIPTIN PHOSPHATE 100 MG PO TABS: 100.0000 mg | ORAL_TABLET | Freq: Every day | ORAL | 3 refills | Status: DC

## 2018-11-27 MED FILL — JANUVIA 100 MG TABLET: 100 | 30 days supply | Qty: 30 | Fill #0

## 2018-12-07 NOTE — Telephone Encounter (Signed)
Referral faxed on 11/14/18 and 12/07/18

## 2018-12-14 ENCOUNTER — Other Ambulatory Visit: Payer: Self-pay

## 2018-12-14 ENCOUNTER — Ambulatory Visit: Payer: 59 | Admitting: Endocrinology

## 2018-12-14 NOTE — Progress Notes (Deleted)
Patient ID: Kyle Dawson, male   DOB: [birth year], 53 y.o.   MRN: [id number]            Reason for Appointment: Follow-up for Type 2 Diabetes  Referring physician: [place]   History of Present Illness:          Date of diagnosis of type 2 diabetes mellitus: ?  2015        Background history:   Although he thinks his blood sugars have been borderline in the past his labs include glucose readings of 183 and 315, 2 years ago He thinks he had a normal A1c in 2016 but no records are available this A1c was 6.7 in 2015.   When first seen the patient was not on any pharmacological therapy for his poorly controlled diabetes and A1c of 12%  Recent history:        His A1c is significantly higher at 9.7, has been as low as 6.3  He has not been seen in follow-up since 02/2017 when his A1c was 6.8  Non-insulin hypoglycemic drugs the patient is taking are: Januvia 50 mg daily  Previously on Jardiance 25 mg daily, metformin ER 1500 mg daily  Current management, blood sugar patterns and problems identified:  He says that he stopped taking metformin probably a year ago since he was having GI symptoms with this  He also has not taken his Jardiance since about the beginning of the year  His blood sugars have been progressively higher for at least 2 months  Recently has been started on Januvia 50 mg daily from his PCP  However his blood sugars are as high as 434 in the hospital last month he does not think he has any excessive thirst, urination, fatigue or visual changes  As before he has difficulty losing weight  No side effects from Jardiance previously  Glucose monitoring:  done less than 1 times a day         Glucometer:  One Touch Verio IQ   PRE-MEAL Fasting Lunch Dinner Bedtime Overall  Glucose range:  201-365    245-345   Mean/median:  305   246   277     Self-care: The diet that the patient has been following is: tries to limit high-fat foods and fast food .      Typical  meal intake: Breakfast is oatmeal               Dietician visit, most recent: None               Exercise: Walking 60 minutes, 4 times a week  Weight history:  Previous maximum 265  Wt Readings from Last 3 Encounters:  11/07/18 220 lb 3.2 oz (99.9 kg)  10/23/18 228 lb 12.8 oz (103.8 kg)  10/13/18 226 lb (102.5 kg)    Glycemic control:   Lab Results  Component Value Date   HGBA1C 9.7 (H) 09/25/2018   HGBA1C 7.5 (H) 11/27/2017   HGBA1C 6.8 (H) 02/23/2017   Lab Results  Component Value Date   MICROALBUR 1.0 02/23/2017   LDLCALC 122 (H) 09/26/2018   CREATININE 1.25 11/09/2018   Lab Results  Component Value Date   MICRALBCREAT 1.2 02/23/2017     OTHER active problems: See review of systems   Allergies as of 12/14/2018      Reactions   Oxycodone Other (See Comments)   Hyper, sensitive to this      Medication List  Accurate as of December 14, 2018  9:13 AM. If you have any questions, ask your nurse or doctor.        amLODipine 10 MG tablet Commonly known as: NORVASC Take 1 tablet (10 mg total) by mouth daily.   atorvastatin 10 MG tablet Commonly known as: LIPITOR Take 1 tablet (10 mg total) by mouth daily.   COD LIVER OIL PO Take by mouth daily.   DHA PO Take by mouth at bedtime.   empagliflozin 25 MG Tabs tablet Commonly known as: JARDIANCE Take 25 mg by mouth daily.   FISH OIL PO Take 1 tablet by mouth daily.   EPA PO Take by mouth at bedtime.   folic acid 1 MG tablet Commonly known as: FOLVITE Take 1 tablet (1 mg total) by mouth daily.   furosemide 40 MG tablet Commonly known as: LASIX Take 0.5 tablets (20 mg total) by mouth daily.   irbesartan 300 MG tablet Commonly known as: AVAPRO Take 1 tablet (300 mg total) by mouth daily.   metoprolol succinate 50 MG 24 hr tablet Commonly known as: TOPROL-XL Take 1 tablet (50 mg total) by mouth 2 (two) times a day. Take with or immediately following a meal.   multivitamin with minerals Tabs  tablet Take 1 tablet by mouth daily.   OneTouch Delica Lancets 65Y Misc USE TO CHECK BLOOD SUGAR ONCE DAILY   OneTouch Verio test strip Generic drug: glucose blood USE AS DIRECTED TO CHECK BLOOD SUGAR ONCE DAILY   rivaroxaban 20 MG Tabs tablet Commonly known as: XARELTO Take 1 tablet (20 mg total) by mouth daily with supper.   Rybelsus 3 MG Tabs Generic drug: Semaglutide Take 3 mg by mouth daily before breakfast.   sitaGLIPtin 100 MG tablet Commonly known as: JANUVIA Take 1 tablet (100 mg total) by mouth daily. Take 1 tablet by mouth once daily.   thiamine 100 MG tablet Take 1 tablet (100 mg total) by mouth daily.   VITAMIN B COMPLEX PO Take 1 tablet by mouth daily.       Allergies:  Allergies  Allergen Reactions  . Oxycodone Other (See Comments)    Hyper, sensitive to this    Past Medical History:  Diagnosis Date  . Basal cell carcinoma   . Chest pain   . Diabetes mellitus without complication ([place])   . Edema    lower extremity  . Hypertension   . Squamous cell carcinoma    R ear, nose, each side of face    Past Surgical History:  Procedure Laterality Date  . Removal basal cell carcinoma    . Removal squamous cell carcinoma      Family History  Problem Relation Age of Onset  . Peripheral vascular disease Mother 84  . Hypertension Mother   . Heart failure Mother   . CAD Maternal Grandfather   . Heart failure Paternal Grandfather   . Hypertension Father     Social History:  reports that he has quit smoking. He has never used smokeless tobacco. He reports current alcohol use. He reports that he does not use drugs.   Review of Systems  Gastrointestinal: Negative for diarrhea.  Genitourinary:       Nocturia 1-2 times  Neurological: Positive for balance difficulty. Negative for numbness and tingling.       He can walk in the morning but during the day may have some gait instability     Lipid history: on Lipitor 80 mg  previously and now only  on  10 mg    Lab Results  Component Value Date   CHOL 236 (H) 09/26/2018   HDL 91 09/26/2018   LDLCALC 122 (H) 09/26/2018   TRIG 115 09/26/2018   CHOLHDL 2.6 09/26/2018            Hypertension:Since 1996, On amlodipine 10 mg, Bystolic 5 mg and Avapro 295 Appears to have been difficult to control  Followed by PCP Lasix started when he was in the hospital potentially to call directly edema from increased amlodipine  Blood pressure checked regularly at home: Recently about 130/80  BP Readings from Last 3 Encounters:  11/07/18 127/84  10/23/18 122/84  10/13/18 130/90     Most recent foot exam: 10/18     Physical Examination:  There were no vitals taken for this visit.        ASSESSMENT:  Diabetes type 2, BMI 32  His A1c is markedly increased at 9.7  See history of present illness for detailed discussion of current diabetes management, blood sugar patterns and problems identified  Previously when taking metformin and Jardiance he had fairly good control although not optimal and also had issues with continued obesity He is trying to do fairly well with his lifestyle with exercise but may not be always doing well with diet  Although he is unable to tolerate metformin and does not want to try it again he can still benefit from [place] Since he has a high cardiovascular risk and history of cerebrovascular disease needs additional medications with benefits of weight loss and risk reduction  HYPERTENSION: Better controlled with increased amlodipine  Lipids: Followed by PCP and recently had high levels, just starting Lipitor  PLAN:    Discussed with the patient the nature of GLP-1 drugs, the action on various organ systems, improved satiety, how they benefit blood glucose control, as well as the benefit of weight loss.  Explained possible side effects of RYBELSUS, most commonly nausea that usually improves over time; discussed safety information in package insert.  Given  patient detailed information on dosage titration, possible side effects and manufacturer support information as well as co-pay card He will take this in the morning 30-minute before breakfast To start with 3 mg dosage weekly for the first 4 weeks and then reassess dose  Also discussed action of SGLT 2 drugs on lowering glucose by decreasing kidney absorption of glucose, benefits of weight loss and lower blood pressure, possible side effects including candidiasis and dosage regimen   He is going to start back on Jardiance 25 mg daily in the morning He will at the same time leave off his Lasix and take this only as needed to avoid potential decreased renal function.  Also likely will not have edema while taking Jardiance He will continue Januvia but increase the dose to 100 mg which is more appropriate for his renal function, may not need this long-term Balanced meals with some protein and low normal fat and carbohydrate content  Discussed importance of checking blood sugars at various times and progression of diabetes Also discussed possibility of going on insulin if above medications do not bring sugars down effectively  Follow-up in 6 weeks  There are no Patient Instructions on file for this visit. Counseling time on subjects discussed in assessment and plan sections is over 50% of today's 25 minute visit    Elayne Snare 12/14/2018, 9:13 AM   Note: This office note was prepared with Dragon voice recognition system technology. Any transcriptional errors that  result from this process are unintentional.

## 2018-12-15 ENCOUNTER — Encounter: Payer: 59 | Admitting: Internal Medicine

## 2018-12-18 ENCOUNTER — Other Ambulatory Visit: Payer: Self-pay

## 2018-12-18 MED ORDER — RIVAROXABAN 20 MG PO TABS: 20.0000 mg | ORAL_TABLET | Freq: Every day | ORAL | 0 refills | Status: DC

## 2018-12-18 MED ORDER — RYBELSUS 3 MG PO TABS: 3.0000 mg | ORAL_TABLET | Freq: Every day | ORAL | 0 refills | Status: DC

## 2018-12-26 ENCOUNTER — Institutional Professional Consult (permissible substitution): Payer: 59 | Admitting: Pulmonary Disease

## 2019-01-01 ENCOUNTER — Ambulatory Visit: Payer: 59 | Admitting: Endocrinology

## 2019-01-01 ENCOUNTER — Ambulatory Visit: Payer: 59 | Admitting: Internal Medicine

## 2019-01-01 NOTE — Telephone Encounter (Signed)
FYI

## 2019-01-01 NOTE — Telephone Encounter (Signed)
My apologies but this was merely FYI. I will ensure I do not inform you of these changes in the future, Thank you.

## 2019-01-18 ENCOUNTER — Telehealth: Payer: Self-pay | Admitting: *Deleted

## 2019-01-18 NOTE — Telephone Encounter (Signed)
lvm for pt to call office to R/S Richardson Dopp appt on 9/30  due to appt being on a [place] day.

## 2019-02-06 NOTE — Progress Notes (Deleted)
Cardiology Office Note:    Date:  02/06/2019   ID:  Kyle Dawson, DOB [birth year], MRN [id number]  PCP:  Elby Showers, MD  Cardiologist:  Dorris Carnes, MD *** Electrophysiologist:  None   Referring MD: Elby Showers, MD   No chief complaint on file. ***  History of Present Illness:    Kyle Dawson is a 53 y.o. male with:  Coronary artery disease  ? Coronary CTA 7/19:  50-75% mid LAD lesion that was neg by FFR  AFib ? Admitted 5/19 w/ AF with RVR ? CHADS2-VASc=5 (HTN, DM, Prior TIA, CAD) >>Xarelto   Hypertension ? Spironolactone DC'd 2/2 increasing Creatinine    Diabetes Mellitus  Prior TIA  GERD  Pericardial Effusion  Echo in 7/19 with mod effusion  Alcohol abuse  Mr. Griffee was last seen via Telemedicine in 10/2018.    He returns for follow up.  The DICTATELATER SmartLink is not supported in this context. ***  Prior CV studies:   The following studies were reviewed today:  *** Echo 09/26/2018 EF 60-65, mod conc LVH, Gr 2 DD, normal RVSF, mild LAE  Chest CTA 09/25/2018 IMPRESSION: 1. No PE identified. No dissection. 2. The lungs are clear. 3. Again noted is a left-sided SVC, a normal variant. 4. Hepatic steatosis.  Coronary CTA 11/29/17 Coronary Arteries: Right dominant with no anomalies LM: Less than 30% calcified disease in ostium LAD: 50-75% mixed plaque but heavily calcified in proximal and mid LAD D1: Small less than 30% mixed plaque proximally D2: 50-75% proximal stenosis calcified Circumflex: Less than 30% calcified stenosis in proximal and mid vessel OM1: Small and normal OM2: Less than 30% proximal calcific stenosis OM3: Normal RCA: Less than 30% calcified stenosis in the proximal mid and distal vessel PDA: Normal PLA: Normal IMPRESSION: 1. Calcium score 838 which is 67 th percentile for age and sex 2. 50-75% heavily calcified stenosis in the proximal/mid LAD and D2 study will be sent for FFR CT 3. Normal aortic root 3.7 cm  FFR RCA:.95 LAD: Proximal .92 Mid .92 D1:.93 D2:.92 Circumflex:.93 IMPRESSION: No hemodynamically significant coronary artery lesions  Renal Art [place] 11/30/17 No evidence of RAS bilaterally  Echo 11/27/17 Mod LVH, EF 55-60, no RWMA, small to mod circumferential effusion  ABIs 07/22/14 Normal ABIs bilaterally  Past Medical History:  Diagnosis Date  . Basal cell carcinoma   . Chest pain   . Diabetes mellitus without complication ([place])   . Edema    lower extremity  . Hypertension   . Squamous cell carcinoma    R ear, nose, each side of face   Surgical Hx: The patient  has a past surgical history that includes Removal basal cell carcinoma and Removal squamous cell carcinoma.   Current Medications: No outpatient medications have been marked as taking for the 02/07/19 encounter (Appointment) with Richardson Dopp T, PA-C.     Allergies:   Oxycodone   Social History   Tobacco Use  . Smoking status: Former Research scientist (life sciences)  . Smokeless tobacco: Never Used  Substance Use Topics  . Alcohol use: Yes  . Drug use: No     Family Hx: The patient's family history includes CAD in his maternal grandfather; Heart failure in his mother and paternal grandfather; Hypertension in his father and mother; Peripheral vascular disease (age of onset: 77) in his mother.  ROS:   Please see the history of present illness.    ROS All other systems reviewed and are negative.   EKGs/Labs/Other  Test Reviewed:    EKG:  EKG is *** ordered today.  The ekg ordered today demonstrates ***  Recent Labs: 09/25/2018: ALT 34; B Natriuretic Peptide 42.9; TSH 1.383 09/26/2018: Magnesium 1.7 10/25/2018: Hemoglobin 15.7; Platelets 220 11/09/2018: BUN 18; Creatinine, Ser 1.25; Potassium 4.7; Sodium 137   Recent Lipid Panel Lab Results  Component Value Date/Time   CHOL 236 (H) 09/26/2018 03:05 AM   TRIG 115 09/26/2018 03:05 AM   HDL 91 09/26/2018 03:05 AM   CHOLHDL 2.6 09/26/2018 03:05 AM   LDLCALC 122 (H)  09/26/2018 03:05 AM    Physical Exam:    VS:  There were no vitals taken for this visit.    Wt Readings from Last 3 Encounters:  11/07/18 220 lb 3.2 oz (99.9 kg)  10/23/18 228 lb 12.8 oz (103.8 kg)  10/13/18 226 lb (102.5 kg)     ***Physical Exam  ASSESSMENT & PLAN:    *** 1. Hypertensive heart disease without heart failure His blood pressure is much better controlled.  However, his creatinine has increased since starting on spironolactone.  Therefore, I have stopped his spironolactone.  He will get another BMET letter this week to follow-up.  Hopefully with his increased exercise and weight loss, his blood pressure will be much better controlled.  If his pressure starts to increase above target (130/80) we will start him on hydralazine.  2. Paroxysmal atrial fibrillation (HCC) No apparent recurrence.  He seems to be tolerating anticoagulation with Rivaroxaban.  3. Coronary artery disease involving native coronary artery of native heart without angina pectoris Nonobstructive coronary artery disease by coronary CTA in 2019.  He is not on aspirin as he is on Rivaroxaban.  Continue atorvastatin.  4. Snores His primary care physician is arranging sleep testing.  5. AKI (acute kidney injury) ([place]) As noted, spironolactone has been discontinued.  He will have a follow-up BMET later this week to re-check his creatinine.  Dispo:  No follow-ups on file.   Medication Adjustments/Labs and Tests Ordered: Current medicines are reviewed at length with the patient today.  Concerns regarding medicines are outlined above.  Tests Ordered: No orders of the defined types were placed in this encounter.  Medication Changes: No orders of the defined types were placed in this encounter.   Signed, Richardson Dopp, PA-C  02/06/2019 5:16 PM    [facility] [place], [place],   [address] Phone: [phone]; Fax: [phone]

## 2019-02-07 ENCOUNTER — Ambulatory Visit: Payer: 59 | Admitting: Physician Assistant

## 2019-05-25 ENCOUNTER — Inpatient Hospital Stay (HOSPITAL_COMMUNITY)
Admission: EM | Admit: 2019-05-25 | Discharge: 2019-05-28 | DRG: 309 | Disposition: A | Discharge status: Home / Self Care | Payer: Self-pay | Attending: Internal Medicine | Admitting: Internal Medicine

## 2019-05-25 ENCOUNTER — Emergency Department (HOSPITAL_COMMUNITY): Payer: Self-pay

## 2019-05-25 ENCOUNTER — Encounter (HOSPITAL_COMMUNITY): Payer: Self-pay

## 2019-05-25 ENCOUNTER — Other Ambulatory Visit: Payer: Self-pay

## 2019-05-25 DIAGNOSIS — R7989 Other specified abnormal findings of blood chemistry: Secondary | ICD-10-CM

## 2019-05-25 DIAGNOSIS — Z9114 Patient's other noncompliance with medication regimen: Secondary | ICD-10-CM

## 2019-05-25 DIAGNOSIS — Z7984 Long term (current) use of oral hypoglycemic drugs: Secondary | ICD-10-CM

## 2019-05-25 DIAGNOSIS — I480 Paroxysmal atrial fibrillation: Secondary | ICD-10-CM | POA: Diagnosis present

## 2019-05-25 DIAGNOSIS — F4312 Post-traumatic stress disorder, chronic: Secondary | ICD-10-CM | POA: Diagnosis present

## 2019-05-25 DIAGNOSIS — R1310 Dysphagia, unspecified: Secondary | ICD-10-CM | POA: Diagnosis present

## 2019-05-25 DIAGNOSIS — Z789 Other specified health status: Secondary | ICD-10-CM

## 2019-05-25 DIAGNOSIS — Z79899 Other long term (current) drug therapy: Secondary | ICD-10-CM

## 2019-05-25 DIAGNOSIS — Z7982 Long term (current) use of aspirin: Secondary | ICD-10-CM

## 2019-05-25 DIAGNOSIS — K2100 Gastro-esophageal reflux disease with esophagitis, without bleeding: Secondary | ICD-10-CM | POA: Diagnosis present

## 2019-05-25 DIAGNOSIS — Z20822 Contact with and (suspected) exposure to covid-19: Secondary | ICD-10-CM | POA: Diagnosis present

## 2019-05-25 DIAGNOSIS — I4819 Other persistent atrial fibrillation: Principal | ICD-10-CM | POA: Diagnosis present

## 2019-05-25 DIAGNOSIS — R109 Unspecified abdominal pain: Secondary | ICD-10-CM

## 2019-05-25 DIAGNOSIS — Z85828 Personal history of other malignant neoplasm of skin: Secondary | ICD-10-CM

## 2019-05-25 DIAGNOSIS — Z23 Encounter for immunization: Secondary | ICD-10-CM

## 2019-05-25 DIAGNOSIS — E785 Hyperlipidemia, unspecified: Secondary | ICD-10-CM | POA: Diagnosis present

## 2019-05-25 DIAGNOSIS — Z8249 Family history of ischemic heart disease and other diseases of the circulatory system: Secondary | ICD-10-CM

## 2019-05-25 DIAGNOSIS — F329 Major depressive disorder, single episode, unspecified: Secondary | ICD-10-CM | POA: Diagnosis present

## 2019-05-25 DIAGNOSIS — Z7289 Other problems related to lifestyle: Secondary | ICD-10-CM

## 2019-05-25 DIAGNOSIS — I4891 Unspecified atrial fibrillation: Secondary | ICD-10-CM

## 2019-05-25 DIAGNOSIS — I2510 Atherosclerotic heart disease of native coronary artery without angina pectoris: Secondary | ICD-10-CM | POA: Diagnosis present

## 2019-05-25 DIAGNOSIS — I119 Hypertensive heart disease without heart failure: Secondary | ICD-10-CM | POA: Diagnosis present

## 2019-05-25 DIAGNOSIS — E119 Type 2 diabetes mellitus without complications: Secondary | ICD-10-CM | POA: Diagnosis present

## 2019-05-25 DIAGNOSIS — Z6830 Body mass index (BMI) 30.0-30.9, adult: Secondary | ICD-10-CM

## 2019-05-25 DIAGNOSIS — Z885 Allergy status to narcotic agent status: Secondary | ICD-10-CM

## 2019-05-25 DIAGNOSIS — Z8673 Personal history of transient ischemic attack (TIA), and cerebral infarction without residual deficits: Secondary | ICD-10-CM

## 2019-05-25 DIAGNOSIS — F1020 Alcohol dependence, uncomplicated: Secondary | ICD-10-CM | POA: Diagnosis present

## 2019-05-25 DIAGNOSIS — I2489 Other forms of acute ischemic heart disease: Secondary | ICD-10-CM

## 2019-05-25 DIAGNOSIS — E669 Obesity, unspecified: Secondary | ICD-10-CM | POA: Diagnosis present

## 2019-05-25 DIAGNOSIS — Q261 Persistent left superior vena cava: Secondary | ICD-10-CM

## 2019-05-25 DIAGNOSIS — Z7901 Long term (current) use of anticoagulants: Secondary | ICD-10-CM

## 2019-05-25 DIAGNOSIS — Z560 Unemployment, unspecified: Secondary | ICD-10-CM

## 2019-05-25 DIAGNOSIS — I248 Other forms of acute ischemic heart disease: Secondary | ICD-10-CM | POA: Diagnosis present

## 2019-05-25 DIAGNOSIS — Z87891 Personal history of nicotine dependence: Secondary | ICD-10-CM

## 2019-05-25 LAB — TROPONIN I (HIGH SENSITIVITY): Troponin I (High Sensitivity): 47 ng/L — ABNORMAL HIGH (ref <18)

## 2019-05-25 LAB — COMPREHENSIVE METABOLIC PANEL
ALT: 44 U/L (ref 0–44)
AST: 29 U/L (ref 15–41)
Albumin: 3.7 g/dL (ref 3.5–5.0)
Alkaline Phosphatase: 55 U/L (ref 38–126)
Anion gap: 16 — ABNORMAL HIGH (ref 5–15)
BUN: 37 mg/dL — ABNORMAL HIGH (ref 6–20)
CO2: 23 mmol/L (ref 22–32)
Calcium: 9.9 mg/dL (ref 8.9–10.3)
Chloride: 92 mmol/L — ABNORMAL LOW (ref 98–111)
Creatinine, Ser: 1.36 mg/dL — ABNORMAL HIGH (ref 0.61–1.24)
GFR calc Af Amer: >60 mL/min (ref >60)
GFR calc non Af Amer: 58 mL/min — ABNORMAL LOW (ref >60)
Glucose, Bld: 254 mg/dL — ABNORMAL HIGH (ref 70–99)
Potassium: 3.5 mmol/L (ref 3.5–5.1)
Sodium: 131 mmol/L — ABNORMAL LOW (ref 135–145)
Total Bilirubin: 2.2 mg/dL — ABNORMAL HIGH (ref 0.3–1.2)
Total Protein: 6.6 g/dL (ref 6.5–8.1)

## 2019-05-25 LAB — CBC WITH DIFFERENTIAL/PLATELET
Abs Immature Granulocytes: 0.02 10*3/uL (ref 0.00–0.07)
Basophils Absolute: 0 10*3/uL (ref 0.0–0.1)
Basophils Relative: 0 %
Eosinophils Absolute: 0 10*3/uL (ref 0.0–0.5)
Eosinophils Relative: 0 %
HCT: 51.1 % (ref 39.0–52.0)
Hemoglobin: 18.4 g/dL — ABNORMAL HIGH (ref 13.0–17.0)
Immature Granulocytes: 0 %
Lymphocytes Relative: 13 %
Lymphs Abs: 1.1 10*3/uL (ref 0.7–4.0)
MCH: 32.2 pg (ref 26.0–34.0)
MCHC: 36 g/dL (ref 30.0–36.0)
MCV: 89.3 fL (ref 80.0–100.0)
Monocytes Absolute: 1.5 10*3/uL — ABNORMAL HIGH (ref 0.1–1.0)
Monocytes Relative: 18 %
Neutro Abs: 5.9 10*3/uL (ref 1.7–7.7)
Neutrophils Relative %: 69 %
Platelets: 176 10*3/uL (ref 150–400)
RBC: 5.72 MIL/uL (ref 4.22–5.81)
RDW: 11.9 % (ref 11.5–15.5)
WBC: 8.5 10*3/uL (ref 4.0–10.5)
nRBC: 0 % (ref 0.0–0.2)

## 2019-05-25 LAB — LIPASE, BLOOD: Lipase: 43 U/L (ref 11–51)

## 2019-05-25 LAB — MAGNESIUM: Magnesium: 1.8 mg/dL (ref 1.7–2.4)

## 2019-05-25 MED ORDER — DILTIAZEM HCL-DEXTROSE 125-5 MG/125ML-% IV SOLN (PREMIX)
5.0000 mg/h | INTRAVENOUS | Status: DC
  Administered 2019-05-25: 5 mg/h via INTRAVENOUS

## 2019-05-25 MED ORDER — PANTOPRAZOLE SODIUM 40 MG IV SOLR
40.0000 mg | Freq: Once | INTRAVENOUS | Status: AC
  Administered 2019-05-25: 40 mg via INTRAVENOUS
  Filled 2019-05-25: qty 40

## 2019-05-25 MED ORDER — LACTATED RINGERS IV SOLN: INTRAVENOUS | Status: DC

## 2019-05-25 MED ORDER — DILTIAZEM HCL 25 MG/5ML IV SOLN: 20.0000 mg | Freq: Once | INTRAVENOUS | Status: DC

## 2019-05-25 MED ORDER — DILTIAZEM HCL-DEXTROSE 125-5 MG/125ML-% IV SOLN (PREMIX)
5.0000 mg/h | INTRAVENOUS | Status: DC
  Administered 2019-05-25: 5 mg/h via INTRAVENOUS
  Administered 2019-05-26 – 2019-05-27 (×3): 15 mg/h via INTRAVENOUS
  Filled 2019-05-25 (×6): qty 125

## 2019-05-25 MED ORDER — LORAZEPAM 2 MG/ML IJ SOLN
2.0000 mg | Freq: Once | INTRAMUSCULAR | Status: AC
  Administered 2019-05-25: 2 mg via INTRAVENOUS
  Filled 2019-05-25: qty 1

## 2019-05-25 MED ORDER — DILTIAZEM LOAD VIA INFUSION: 15.0000 mg | Freq: Once | INTRAVENOUS | Status: DC

## 2019-05-25 MED ORDER — DILTIAZEM LOAD VIA INFUSION
20.0000 mg | Freq: Once | INTRAVENOUS | Status: AC
  Administered 2019-05-25: 20 mg via INTRAVENOUS
  Filled 2019-05-25: qty 20

## 2019-05-25 NOTE — ED Provider Notes (Signed)
[facility] EMERGENCY DEPARTMENT Provider Note   CSN: [id number] Arrival date & time: 05/25/19  2017     History Chief Complaint  Patient presents with  . Atrial Fibrillation    Kyle Dawson is a 54 y.o. male.  The history is provided by the patient and medical records. No language interpreter was used.  Atrial Fibrillation   Kyle Dawson is a 54 y.o. male who presents to the Emergency Department complaining of atrial fibrillation. He presents the emergency department by EMS complaining of atrial fibrillation. He has a history of atrial fibrillation, hypertension and is not currently on any medications due to insurance issues. He states that he is been under increased stress at home and has been drinking heavily over the last few weeks. Two weeks ago he increases bourbon intake to 2 gallons over the course of the week. On Monday of this week he developed dry heaves and was unable to drink. He states that he was having emesis what he was trying to take anything by mouth. He had severe epigastric pain associated with this, which is now resolved. Two days ago he developed chest pain described distal and achy. Initially pain was waxing and waning. Today the pain became constant in nature. He checked his blood pressure and it was elevated and he also noticed that his heart rate was very elevated and decide to call 911. He denies any fevers, diaphoresis. No emesis for the last two days. He states he is still making urine without difficulty. No known COVID 19 exposures.    Past Medical History:  Diagnosis Date  . Basal cell carcinoma   . Chest pain   . Diabetes mellitus without complication ([place])   . Edema    lower extremity  . Hypertension   . Squamous cell carcinoma    R ear, nose, each side of face    Patient Active Problem List   Diagnosis Date Noted  . Persistent left SVC (superior vena cava) 09/26/2018  . Paroxysmal atrial fibrillation ([place]) 09/25/2018    . Hyperglycemia 09/25/2018  . Type 2 diabetes mellitus ([place]) 09/25/2018  . Chest pain   . SOB (shortness of breath)   . Hx of transient ischemic attack (TIA) 11/26/2017  . Alcohol use 11/26/2017  . Elevated ALT measurement 11/26/2017  . 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 Obesity 10/06/2014  . Nocturnal leg cramps 10/06/2014  . History of squamous cell carcinoma of skin 07/10/2011  . History  of basal cell carcinoma 07/10/2011  . History of dysplastic nevus 07/10/2011  . Overweight 11/19/2010  . Hypertensive heart disease 11/09/2010  . Dependent edema 11/09/2010    Past Surgical History:  Procedure Laterality Date  . Removal basal cell carcinoma    . Removal squamous cell carcinoma         Family History  Problem Relation Age of Onset  . Peripheral vascular disease Mother 31  . Hypertension Mother   . Heart failure Mother   . CAD Maternal Grandfather   . Heart failure Paternal Grandfather   . Hypertension Father     Social History   Tobacco Use  . Smoking status: Former Research scientist (life sciences)  . Smokeless tobacco: Never Used  Substance Use Topics  . Alcohol use: Yes  . Drug use: No    Home Medications Prior to Admission medications   Medication Sig Start Date End Date Taking? Authorizing Provider  Ascorbic Acid (VITAMIN C) 1000 MG tablet Take 2,000 mg by mouth daily.  Yes [provider]  ASHWAGANDHA PO Take 1 capsule by mouth daily.   Yes [provider]  aspirin EC 325 MG tablet Take 650 mg by mouth See admin instructions. Take 650 mg by mouth one to three times daily   Yes [provider]  B Complex Vitamins (VITAMIN B COMPLEX PO) Take 2 tablets by mouth daily.    Yes [provider]  Bilberry, Vaccinium myrtillus, (BILBERRY EXTRACT PO) Take 1 capsule by mouth daily.   Yes [provider]  Cholecalciferol (VITAMIN D3) 50 MCG (2000 UT) TABS Take 4,000 Units by mouth daily.   Yes [provider]  COLLAGEN  PO Take 1 capsule by mouth daily.   Yes [provider]  DHEA 50 MG CAPS Take 50 mg by mouth daily.   Yes [provider]  FENUGREEK PO Take 1 Scoop by mouth daily. MIX and drink   Yes [provider]  GINSENG PO Take 1 capsule by mouth daily.   Yes [provider]  L-GLUTAMINE PO Take 1 capsule by mouth daily.   Yes [provider]  VALERIAN ROOT PO Take 1 capsule by mouth daily.   Yes [provider]  VITAMIN A PO Take 1 capsule by mouth daily.   Yes [provider]  vitamin E (VITAMIN E) 180 MG (400 UNITS) capsule Take 800 Units by mouth daily.   Yes [provider]  zinc gluconate 50 MG tablet Take 50 mg by mouth daily.   Yes [provider]  amLODipine (NORVASC) 10 MG tablet Take 1 tablet (10 mg total) by mouth daily. Patient not taking: Reported on 05/25/2019 10/13/18   Elby Showers, MD  atorvastatin (LIPITOR) 10 MG tablet Take 1 tablet (10 mg total) by mouth daily. Patient not taking: Reported on 05/25/2019 11/07/18 05/25/19  Richardson Dopp T, PA-C  COD LIVER OIL PO Take by mouth daily.    [provider]  Docosahexaenoic Acid (DHA PO) Take by mouth at bedtime.    [provider]  empagliflozin (JARDIANCE) 25 MG TABS tablet Take 25 mg by mouth daily. Patient not taking: Reported on 05/25/2019 10/23/18   Elayne Snare, MD  folic acid (FOLVITE) 1 MG tablet Take 1 tablet (1 mg total) by mouth daily. Patient not taking: Reported on 05/25/2019 09/27/18   Kayleen Memos, DO  furosemide (LASIX) 40 MG tablet Take 0.5 tablets (20 mg total) by mouth daily. Patient not taking: Reported on 05/25/2019 10/31/18   Richardson Dopp T, PA-C  irbesartan (AVAPRO) 300 MG tablet Take 1 tablet (300 mg total) by mouth daily. Patient not taking: Reported on 05/25/2019 10/13/18   Elby Showers, MD  JARDIANCE 10 MG TABS tablet Take 10 mg by mouth daily. 02/09/19   [provider]  metoprolol succinate (TOPROL-XL) 50 MG 24  hr tablet Take 1 tablet (50 mg total) by mouth 2 (two) times a day. Take with or immediately following a meal. Patient not taking: Reported on 05/25/2019 10/13/18   Elby Showers, MD  Multiple Vitamin (MULTIVITAMIN WITH MINERALS) TABS tablet Take 1 tablet by mouth daily. Patient not taking: Reported on 05/25/2019 09/27/18   Kayleen Memos, DO  Omega-3 Fatty Acids (EPA PO) Take by mouth at bedtime.    [provider]  Omega-3 Fatty Acids (FISH OIL PO) Take 1 tablet by mouth daily.     [provider]  OneTouch Delica Lancets 95J MISC USE TO CHECK BLOOD SUGAR ONCE DAILY 11/01/18  Elayne Snare, MD  [facility] VERIO test strip USE AS DIRECTED TO CHECK BLOOD SUGAR ONCE DAILY Patient taking differently: 1 each by Other route daily.  11/01/18   Elayne Snare, MD  rivaroxaban (XARELTO) 20 MG TABS tablet Take 1 tablet (20 mg total) by mouth daily with supper. Patient not taking: Reported on 05/25/2019 12/18/18   Elby Showers, MD  Semaglutide (RYBELSUS) 3 MG TABS Take 3 mg by mouth daily before breakfast. Patient not taking: Reported on 05/25/2019 12/18/18   Elayne Snare, MD  sitaGLIPtin (JANUVIA) 100 MG tablet Take 1 tablet (100 mg total) by mouth daily. Take 1 tablet by mouth once daily. Patient not taking: Reported on 05/25/2019 11/27/18   Elayne Snare, MD  thiamine 100 MG tablet Take 1 tablet (100 mg total) by mouth daily. Patient not taking: Reported on 05/25/2019 09/27/18   Kayleen Memos, DO    Allergies    Oxycodone  Review of Systems   Review of Systems  All other systems reviewed and are negative.   Physical Exam Updated Vital Signs BP (!) 160/99   Pulse 71   Resp 15   SpO2 97%   Physical Exam Vitals and nursing note reviewed.  Constitutional:      Appearance: He is well-developed.  HENT:     Head: Normocephalic and atraumatic.  Cardiovascular:     Rate and Rhythm: Tachycardia present. Rhythm irregular.     Heart sounds: No murmur.  Pulmonary:     Effort: Pulmonary effort  is normal. No respiratory distress.     Breath sounds: Normal breath sounds.  Abdominal:     Palpations: Abdomen is soft.     Tenderness: There is no abdominal tenderness. There is no guarding or rebound.  Musculoskeletal:        General: No swelling or tenderness.  Skin:    General: Skin is warm and dry.  Neurological:     Mental Status: He is alert and oriented to person, place, and time.  Psychiatric:        Behavior: Behavior normal.     ED Results / Procedures / Treatments   Labs (all labs ordered are listed, but only abnormal results are displayed) Labs Reviewed  COMPREHENSIVE METABOLIC PANEL - Abnormal; Notable for the following components:      Result Value   Sodium 131 (*)    Chloride 92 (*)    Glucose, Bld 254 (*)    BUN 37 (*)    Creatinine, Ser 1.36 (*)    Total Bilirubin 2.2 (*)    GFR calc non Af Amer 58 (*)    Anion gap 16 (*)    All other components within normal limits  CBC WITH DIFFERENTIAL/PLATELET - Abnormal; Notable for the following components:   Hemoglobin 18.4 (*)    Monocytes Absolute 1.5 (*)    All other components within normal limits  TROPONIN I (HIGH SENSITIVITY) - Abnormal; Notable for the following components:   Troponin I (High Sensitivity) 47 (*)    All other components within normal limits  SARS CORONAVIRUS 2 (TAT 6-24 HRS)  MAGNESIUM  LIPASE, BLOOD  TROPONIN I (HIGH SENSITIVITY)    EKG EKG Interpretation  Date/Time:  Friday May 25 2019 20:20:59 EST Ventricular Rate:  161 PR Interval:    QRS Duration: 85 QT Interval:  303 QTC Calculation: 496 R Axis:   -9 Text Interpretation: Atrial fibrillation with rapid V-rate Ventricular premature complex Abnormal R-wave progression, early transition Borderline T abnormalities, inferior leads Confirmed  by Quintella Reichert [phone]) on 05/25/2019 8:26:38 PM   Radiology DG Chest Port 1 View  Result Date: 05/25/2019 CLINICAL DATA:  Chest pain for 2 days EXAM: PORTABLE CHEST 1 VIEW  COMPARISON:  None. FINDINGS: The heart size and mediastinal contours are within normal limits. Both lungs are clear. The visualized skeletal structures are unremarkable. IMPRESSION: No active disease. Electronically Signed   By: Inez Catalina M.D.   On: 05/25/2019 21:25    Procedures Procedures (including critical care time) CRITICAL CARE Performed by: Quintella Reichert   Total critical care time: 35 minutes  Critical care time was exclusive of separately billable procedures and treating other patients.  Critical care was necessary to treat or prevent imminent or life-threatening deterioration.  Critical care was time spent personally by me on the following activities: development of treatment plan with patient and/or surrogate as well as nursing, discussions with consultants, evaluation of patient's response to treatment, examination of patient, obtaining history from patient or surrogate, ordering and performing treatments and interventions, ordering and review of laboratory studies, ordering and review of radiographic studies, pulse oximetry and re-evaluation of patient's condition.  Medications Ordered in ED Medications  diltiazem (CARDIZEM) 1 mg/mL load via infusion 20 mg (20 mg Intravenous Bolus from Bag 05/25/19 2104)    And  diltiazem (CARDIZEM) 125 mg in dextrose 5% 125 mL (1 mg/mL) infusion (10 mg/hr Intravenous Rate/Dose Change 05/25/19 2143)  lactated ringers infusion ( Intravenous New Bag/Given 05/25/19 2220)  pantoprazole (PROTONIX) injection 40 mg (has no administration in time range)  LORazepam (ATIVAN) injection 2 mg (2 mg Intravenous Given 05/25/19 2058)    ED Course  I have reviewed the triage vital signs and the nursing notes.  Pertinent labs & imaging results that were available during my care of the patient were reviewed by me and considered in my medical decision making (see chart for details).    MDM Rules/Calculators/A&P                     Patient here for  evaluation of chest pain, history of atrial fibrillation here in a fib with RVR. Patient tachycardic to the 160s on ED arrival. He was treated with diltiazem, IV fluids as well as Ativan. Heart rate improved with diltiazem administration. Patient's chest pain improved with Ativan administration. Medicine consulted for admission for recurrent a fib with RVR.  Final Clinical Impression(s) / ED Diagnoses Final diagnoses:  Atrial fibrillation with RVR [facility])    Rx / [place] Orders ED Discharge Orders    None       Quintella Reichert, MD 05/25/19 2233

## 2019-05-25 NOTE — ED Triage Notes (Signed)
Pt bib [facility] w/ c/o chest pain x 2 days. Hx of a-fib, been off meds for 6 months. EMS 12 lead shows A-FIB RVR, HR 140-160.

## 2019-05-26 ENCOUNTER — Other Ambulatory Visit: Payer: Self-pay

## 2019-05-26 ENCOUNTER — Inpatient Hospital Stay (HOSPITAL_COMMUNITY): Payer: Self-pay

## 2019-05-26 DIAGNOSIS — Z62810 Personal history of physical and sexual abuse in childhood: Secondary | ICD-10-CM

## 2019-05-26 DIAGNOSIS — R109 Unspecified abdominal pain: Secondary | ICD-10-CM

## 2019-05-26 DIAGNOSIS — I4891 Unspecified atrial fibrillation: Secondary | ICD-10-CM

## 2019-05-26 DIAGNOSIS — R7989 Other specified abnormal findings of blood chemistry: Secondary | ICD-10-CM

## 2019-05-26 DIAGNOSIS — I10 Essential (primary) hypertension: Secondary | ICD-10-CM

## 2019-05-26 DIAGNOSIS — I2489 Other forms of acute ischemic heart disease: Secondary | ICD-10-CM

## 2019-05-26 DIAGNOSIS — R079 Chest pain, unspecified: Secondary | ICD-10-CM

## 2019-05-26 DIAGNOSIS — R1310 Dysphagia, unspecified: Secondary | ICD-10-CM

## 2019-05-26 DIAGNOSIS — F1020 Alcohol dependence, uncomplicated: Secondary | ICD-10-CM

## 2019-05-26 DIAGNOSIS — I248 Other forms of acute ischemic heart disease: Secondary | ICD-10-CM

## 2019-05-26 DIAGNOSIS — F4310 Post-traumatic stress disorder, unspecified: Secondary | ICD-10-CM

## 2019-05-26 DIAGNOSIS — F4312 Post-traumatic stress disorder, chronic: Secondary | ICD-10-CM

## 2019-05-26 LAB — LIPID PANEL
Cholesterol: 213 mg/dL — ABNORMAL HIGH (ref 0–200)
HDL: 79 mg/dL (ref >40)
LDL Cholesterol: 106 mg/dL — ABNORMAL HIGH (ref 0–99)
Total CHOL/HDL Ratio: 2.7 RATIO
Triglycerides: 142 mg/dL (ref <150)
VLDL: 28 mg/dL (ref 0–40)

## 2019-05-26 LAB — ECHOCARDIOGRAM COMPLETE: Weight: 3516.8 oz

## 2019-05-26 LAB — TROPONIN I (HIGH SENSITIVITY)
Troponin I (High Sensitivity): 33 ng/L — ABNORMAL HIGH (ref <18)
Troponin I (High Sensitivity): 39 ng/L — ABNORMAL HIGH (ref <18)

## 2019-05-26 LAB — GLUCOSE, CAPILLARY
Glucose-Capillary: 237 mg/dL — ABNORMAL HIGH (ref 70–99)
Glucose-Capillary: 179 mg/dL — ABNORMAL HIGH (ref 70–99)
Glucose-Capillary: 123 mg/dL — ABNORMAL HIGH (ref 70–99)
Glucose-Capillary: 205 mg/dL — ABNORMAL HIGH (ref 70–99)

## 2019-05-26 LAB — HIV ANTIBODY (ROUTINE TESTING W REFLEX): HIV Screen 4th Generation wRfx: NONREACTIVE

## 2019-05-26 LAB — HEMOGLOBIN A1C
Hgb A1c MFr Bld: 9.2 % — ABNORMAL HIGH (ref 4.8–5.6)
Mean Plasma Glucose: 217.34 mg/dL

## 2019-05-26 LAB — SARS CORONAVIRUS 2 (TAT 6-24 HRS): SARS Coronavirus 2: NEGATIVE

## 2019-05-26 LAB — HEPARIN LEVEL (UNFRACTIONATED): Heparin Unfractionated: 0.35 IU/mL (ref 0.30–0.70)

## 2019-05-26 MED ORDER — PERFLUTREN LIPID MICROSPHERE
1.0000 mL | INTRAVENOUS | Status: AC | PRN
  Administered 2019-05-26: 3 mL via INTRAVENOUS
  Filled 2019-05-26: qty 10

## 2019-05-26 MED ORDER — THIAMINE HCL 100 MG/ML IJ SOLN: 100.0000 mg | Freq: Every day | INTRAMUSCULAR | Status: DC

## 2019-05-26 MED ORDER — FOLIC ACID 1 MG PO TABS
1.0000 mg | ORAL_TABLET | Freq: Every day | ORAL | Status: DC
  Administered 2019-05-26 – 2019-05-28 (×3): 1 mg via ORAL
  Filled 2019-05-26 (×3): qty 1

## 2019-05-26 MED ORDER — HEPARIN BOLUS VIA INFUSION
5000.0000 [IU] | Freq: Once | INTRAVENOUS | Status: AC
  Administered 2019-05-26: 5000 [IU] via INTRAVENOUS
  Filled 2019-05-26: qty 5000

## 2019-05-26 MED ORDER — ADULT MULTIVITAMIN W/MINERALS CH
1.0000 | ORAL_TABLET | Freq: Every day | ORAL | Status: DC
  Administered 2019-05-26 – 2019-05-28 (×3): 1 via ORAL
  Filled 2019-05-26 (×3): qty 1

## 2019-05-26 MED ORDER — TRAZODONE HCL 100 MG PO TABS: 100.0000 mg | ORAL_TABLET | Freq: Every evening | ORAL | Status: DC | PRN

## 2019-05-26 MED ORDER — IRBESARTAN 150 MG PO TABS: 150.0000 mg | ORAL_TABLET | Freq: Every day | ORAL | Status: DC

## 2019-05-26 MED ORDER — SUCRALFATE 1 GM/10ML PO SUSP
1.0000 g | Freq: Three times a day (TID) | ORAL | Status: DC
  Administered 2019-05-26 – 2019-05-28 (×9): 1 g via ORAL
  Filled 2019-05-26 (×9): qty 10

## 2019-05-26 MED ORDER — IRBESARTAN 150 MG PO TABS
150.0000 mg | ORAL_TABLET | Freq: Every day | ORAL | Status: DC
  Administered 2019-05-27 – 2019-05-28 (×2): 150 mg via ORAL
  Filled 2019-05-26 (×2): qty 1

## 2019-05-26 MED ORDER — ACETAMINOPHEN 325 MG PO TABS
650.0000 mg | ORAL_TABLET | Freq: Four times a day (QID) | ORAL | Status: DC | PRN
  Administered 2019-05-26: 650 mg via ORAL
  Filled 2019-05-26: qty 2

## 2019-05-26 MED ORDER — PANTOPRAZOLE SODIUM 40 MG IV SOLR
40.0000 mg | Freq: Two times a day (BID) | INTRAVENOUS | Status: DC
  Administered 2019-05-26 – 2019-05-28 (×5): 40 mg via INTRAVENOUS
  Filled 2019-05-26 (×5): qty 40

## 2019-05-26 MED ORDER — LORAZEPAM 2 MG/ML IJ SOLN: 1.0000 mg | INTRAMUSCULAR | Status: DC | PRN

## 2019-05-26 MED ORDER — METOPROLOL SUCCINATE ER 50 MG PO TB24: 50.0000 mg | ORAL_TABLET | Freq: Every day | ORAL | Status: DC

## 2019-05-26 MED ORDER — ACETAMINOPHEN 650 MG RE SUPP: 650.0000 mg | Freq: Four times a day (QID) | RECTAL | Status: DC | PRN

## 2019-05-26 MED ORDER — PROMETHAZINE HCL 25 MG/ML IJ SOLN: 25.0000 mg | Freq: Four times a day (QID) | INTRAMUSCULAR | Status: DC | PRN

## 2019-05-26 MED ORDER — INFLUENZA VAC SPLIT QUAD 0.5 ML IM SUSY
0.5000 mL | PREFILLED_SYRINGE | INTRAMUSCULAR | Status: AC
  Administered 2019-05-28: 0.5 mL via INTRAMUSCULAR
  Filled 2019-05-26: qty 0.5

## 2019-05-26 MED ORDER — FLUOXETINE HCL 20 MG PO CAPS
20.0000 mg | ORAL_CAPSULE | Freq: Every day | ORAL | Status: DC
  Administered 2019-05-26 – 2019-05-28 (×3): 20 mg via ORAL
  Filled 2019-05-26 (×3): qty 1

## 2019-05-26 MED ORDER — THIAMINE HCL 100 MG PO TABS
100.0000 mg | ORAL_TABLET | Freq: Every day | ORAL | Status: DC
  Administered 2019-05-26 – 2019-05-28 (×3): 100 mg via ORAL
  Filled 2019-05-26 (×3): qty 1

## 2019-05-26 MED ORDER — LORAZEPAM 1 MG PO TABS: 1.0000 mg | ORAL_TABLET | ORAL | Status: DC | PRN

## 2019-05-26 MED ORDER — HEPARIN (PORCINE) 25000 UT/250ML-% IV SOLN
1300.0000 [IU]/h | INTRAVENOUS | Status: DC
  Administered 2019-05-26: 1200 [IU]/h via INTRAVENOUS
  Administered 2019-05-26: 1300 [IU]/h via INTRAVENOUS
  Filled 2019-05-26 (×2): qty 250

## 2019-05-26 MED ORDER — INSULIN ASPART 100 UNIT/ML ~~LOC~~ SOLN
0.0000 [IU] | Freq: Every day | SUBCUTANEOUS | Status: DC
  Administered 2019-05-26: 2 [IU] via SUBCUTANEOUS

## 2019-05-26 MED ORDER — INSULIN ASPART 100 UNIT/ML ~~LOC~~ SOLN
0.0000 [IU] | Freq: Three times a day (TID) | SUBCUTANEOUS | Status: DC
  Administered 2019-05-26: 3 [IU] via SUBCUTANEOUS
  Administered 2019-05-26: 1 [IU] via SUBCUTANEOUS
  Administered 2019-05-26: 2 [IU] via SUBCUTANEOUS
  Administered 2019-05-27 (×2): 3 [IU] via SUBCUTANEOUS
  Administered 2019-05-27: 2 [IU] via SUBCUTANEOUS
  Administered 2019-05-28: 3 [IU] via SUBCUTANEOUS
  Administered 2019-05-28: 2 [IU] via SUBCUTANEOUS
  Administered 2019-05-28: 3 [IU] via SUBCUTANEOUS

## 2019-05-26 MED ORDER — METOPROLOL SUCCINATE ER 50 MG PO TB24
50.0000 mg | ORAL_TABLET | Freq: Every day | ORAL | Status: DC
  Administered 2019-05-27 – 2019-05-28 (×2): 50 mg via ORAL
  Filled 2019-05-26 (×2): qty 1

## 2019-05-26 NOTE — Care Management Note (Addendum)
Patient seen and examined at bedside he had complained of epigastric pain and the Protonix was not helping.  Service I added sucralfate.  Later this evening he calls me his nurse called to inform me that he is concerned about his blood pressure and heart rate going up especially when he gets up to make his bed he felt heart rate went up to 140s.  He is currently on IV Cardizem at 15 mics his heart rate was 105 when I saw him but currently nurse reports is running in the 120s 140s when he got up.  His blood pressure was 160 over over 98..  Early examination showed tenderness in the epigastric area he does not have any edema I have consulted cardiology his home blood pressure medicine include Avapro 300 mg daily metoprolol XL 50 mg daily and amlodipine.

## 2019-05-26 NOTE — Progress Notes (Addendum)
[place] for Heparin  Indication: atrial fibrillation  Allergies  Allergen Reactions  . Oxycodone Anxiety and Other (See Comments)    "I drank it with beer and it made me hyper"    Patient Measurements: Weight: 219 lb 12.8 oz (99.7 kg)  Vital Signs: Temp: 98.1 F (36.7 C) (01/16 1531) Temp Source: Oral (01/16 1531) BP: 160/93 (01/16 1538) Pulse Rate: 92 (01/16 1538)  Labs: Recent Labs    05/25/19 0015 05/25/19 2029 05/26/19 0727 05/26/19 1456  HGB  --  18.4*  --   --   HCT  --  51.1  --   --   PLT  --  176  --   --   HEPARINUNFRC  --   --   --  0.35  CREATININE  --  1.36*  --   --   TROPONINIHS 39* 47* 33*  --     CrCl cannot be calculated (Unknown ideal weight.).   Medical History: Past Medical History:  Diagnosis Date  . Basal cell carcinoma   . Chest pain   . Diabetes mellitus without complication ([place])   . Edema    lower extremity  . Hypertension   . Squamous cell carcinoma    R ear, nose, each side of face    Assessment: 54 y/o M with afib to start heparin, previously on Xarelto at home but hasn't taken in about 6 months, had been taking high dose aspirin instead.  Heparin level this morning came back therapeutic at 0.35, on 1200 units/hr. CBC stable on last check. No s/sx of bleeding.   Goal of Therapy:  Heparin level 0.3-0.7 units/ml Monitor platelets by anticoagulation protocol: Yes   Plan:  Increase heparin infusion slightly to 1300 units/hr to keep in goal range Daily CBC/HL Monitor for bleeding  Antonietta Jewel, PharmD, BCCCP Clinical Pharmacist  Phone: [phone]  Please check AMION for all [place] phone numbers After 10:00 PM, call [place] [phone]

## 2019-05-26 NOTE — ED Notes (Signed)
ED TO INPATIENT HANDOFF REPORT  ED Nurse Name and Phone #: Yailin Biederman [phone]  S Name/Age/Gender Kyle Dawson 54 y.o. male Room/Bed: 018C/018C  Code Status   Code Status: Prior  Home/SNF/Other Home Patient oriented to: self, place, time and situation Is this baseline? Yes   Triage Complete: Triage complete  Chief Complaint Atrial fibrillation with rapid ventricular response ([place]) [I48.91]  Triage Note Pt bib [facility] w/ c/o chest pain x 2 days. Hx of a-fib, been off meds for 6 months. EMS 12 lead shows A-FIB RVR, HR 140-160.     Allergies Allergies  Allergen Reactions  . Oxycodone Anxiety and Other (See Comments)    "I drank it with beer and it made me hyper"    Level of Care/Admitting Diagnosis ED Disposition    ED Disposition Condition [place]: [place] [100100]  Level of Care: Progressive [102]  Admit to Progressive based on following criteria: CARDIOVASCULAR & THORACIC of moderate stability with acute coronary syndrome symptoms/low risk myocardial infarction/hypertensive urgency/arrhythmias/heart failure potentially compromising stability and stable post cardiovascular intervention patients.  Covid Evaluation: Asymptomatic Screening Protocol (No Symptoms)  Diagnosis: Atrial fibrillation with rapid ventricular response [facility]) [[id number]]  Admitting Physician: Shela Leff [[id number]]  Attending Physician: Shela Leff [[id number]]  Estimated length of stay: past midnight tomorrow  Certification:: I certify this patient will need inpatient services for at least 2 midnights       B Medical/Surgery History Past Medical History:  Diagnosis Date  . Basal cell carcinoma   . Chest pain   . Diabetes mellitus without complication ([place])   . Edema    lower extremity  . Hypertension   . Squamous cell carcinoma    R ear, nose, each side of face   Past Surgical History:  Procedure Laterality Date  . Removal basal  cell carcinoma    . Removal squamous cell carcinoma       A IV Location/Drains/Wounds Patient Lines/Drains/Airways Status   Active Line/Drains/Airways    Name:   Placement date:   Placement time:   Site:   Days:   Peripheral IV 05/25/19 Right Antecubital   05/25/19    2038    Antecubital   1   Peripheral IV 05/25/19 Left Antecubital   05/25/19    2214    Antecubital   1          Intake/Output Last 24 hours No intake or output data in the 24 hours ending 05/26/19 0121  Labs/Imaging Results for orders placed or performed during the hospital encounter of 05/25/19 (from the past 48 hour(s))  Troponin I (High Sensitivity)     Status: Abnormal   Collection Time: 05/25/19 12:15 AM  Result Value Ref Range   Troponin I (High Sensitivity) 39 (H) <18 ng/L    Comment: (NOTE) Elevated high sensitivity troponin I (hsTnI) values and significant  changes across serial measurements may suggest ACS but many other  chronic and acute conditions are known to elevate hsTnI results.  Refer to the "Links" section for chest pain algorithms and additional  guidance. Performed at [facility], [place] [address], [place], [place] [address]   Comprehensive metabolic panel     Status: Abnormal   Collection Time: 05/25/19  8:29 PM  Result Value Ref Range   Sodium 131 (L) 135 - 145 mmol/L   Potassium 3.5 3.5 - 5.1 mmol/L   Chloride 92 (L) 98 - 111 mmol/L   CO2 23  22 - 32 mmol/L   Glucose, Bld 254 (H) 70 - 99 mg/dL   BUN 37 (H) 6 - 20 mg/dL   Creatinine, Ser 1.36 (H) 0.61 - 1.24 mg/dL   Calcium 9.9 8.9 - 10.3 mg/dL   Total Protein 6.6 6.5 - 8.1 g/dL   Albumin 3.7 3.5 - 5.0 g/dL   AST 29 15 - 41 U/L   ALT 44 0 - 44 U/L   Alkaline Phosphatase 55 38 - 126 U/L   Total Bilirubin 2.2 (H) 0.3 - 1.2 mg/dL   GFR calc non Af Amer 58 (L) >60 mL/min   GFR calc Af Amer >60 >60 mL/min   Anion gap 16 (H) 5 - 15    Comment: Performed at [facility] [address], [place], [place] [address]  CBC  with Differential     Status: Abnormal   Collection Time: 05/25/19  8:29 PM  Result Value Ref Range   WBC 8.5 4.0 - 10.5 K/uL   RBC 5.72 4.22 - 5.81 MIL/uL   Hemoglobin 18.4 (H) 13.0 - 17.0 g/dL   HCT 51.1 39.0 - 52.0 %   MCV 89.3 80.0 - 100.0 fL   MCH 32.2 26.0 - 34.0 pg   MCHC 36.0 30.0 - 36.0 g/dL   RDW 11.9 11.5 - 15.5 %   Platelets 176 150 - 400 K/uL   nRBC 0.0 0.0 - 0.2 %   Neutrophils Relative % 69 %   Neutro Abs 5.9 1.7 - 7.7 K/uL   Lymphocytes Relative 13 %   Lymphs Abs 1.1 0.7 - 4.0 K/uL   Monocytes Relative 18 %   Monocytes Absolute 1.5 (H) 0.1 - 1.0 K/uL   Eosinophils Relative 0 %   Eosinophils Absolute 0.0 0.0 - 0.5 K/uL   Basophils Relative 0 %   Basophils Absolute 0.0 0.0 - 0.1 K/uL   Immature Granulocytes 0 %   Abs Immature Granulocytes 0.02 0.00 - 0.07 K/uL    Comment: Performed at [facility] [address], [place], [place] [address]  Troponin I (High Sensitivity)     Status: Abnormal   Collection Time: 05/25/19  8:29 PM  Result Value Ref Range   Troponin I (High Sensitivity) 47 (H) <18 ng/L    Comment: (NOTE) Elevated high sensitivity troponin I (hsTnI) values and significant  changes across serial measurements may suggest ACS but many other  chronic and acute conditions are known to elevate hsTnI results.  Refer to the "Links" section for chest pain algorithms and additional  guidance. Performed at [facility], [place] [address] [place], [place] [address]   Magnesium     Status: None   Collection Time: 05/25/19  8:29 PM  Result Value Ref Range   Magnesium 1.8 1.7 - 2.4 mg/dL    Comment: Performed at [facility] [address], [place], [place] [address]  Lipase, blood     Status: None   Collection Time: 05/25/19  8:50 PM  Result Value Ref Range   Lipase 43 11 - 51 U/L    Comment: Performed at [facility] [address], [place], [place] [address]   DG Chest Port 1 View  Result Date: 05/25/2019 CLINICAL DATA:  Chest  pain for 2 days EXAM: PORTABLE CHEST 1 VIEW COMPARISON:  None. FINDINGS: The heart size and mediastinal contours are within normal limits. Both lungs are clear. The visualized skeletal structures are unremarkable. IMPRESSION: No active disease. Electronically Signed  By: Inez Catalina M.D.   On: 05/25/2019 21:25    Pending Labs Unresulted Labs (From admission, onward)    Start     Ordered   05/25/19 2050  SARS CORONAVIRUS 2 (TAT 6-24 HRS) Nasopharyngeal Nasopharyngeal Swab  (Tier 3 (TAT 6-24 hrs))  Once,   STAT    Question Answer Comment  Is this test for diagnosis or screening Screening   Symptomatic for COVID-19 as defined by CDC No   Hospitalized for COVID-19 No   Admitted to ICU for COVID-19 No   Previously tested for COVID-19 Yes   Resident in a congregate (group) care setting Unknown   Employed in healthcare setting Yes      05/25/19 2049          Vitals/Pain Today's Vitals   05/26/19 0030 05/26/19 0045 05/26/19 0100 05/26/19 0115  BP: (!) 165/108 (!) 140/98 (!) 160/100 (!) 144/102  Pulse: (!) 126 (!) 116 70 (!) 113  Resp: 16 13 17 17   SpO2: 98% 94% 98% 95%  PainSc:        Isolation Precautions No active isolations  Medications Medications  diltiazem (CARDIZEM) 1 mg/mL load via infusion 20 mg (20 mg Intravenous Bolus from Bag 05/25/19 2104)    And  diltiazem (CARDIZEM) 125 mg in dextrose 5% 125 mL (1 mg/mL) infusion (15 mg/hr Intravenous Rate/Dose Change 05/25/19 2255)  lactated ringers infusion ( Intravenous New Bag/Given 05/25/19 2220)  LORazepam (ATIVAN) injection 2 mg (2 mg Intravenous Given 05/25/19 2058)  pantoprazole (PROTONIX) injection 40 mg (40 mg Intravenous Given 05/25/19 2259)    Mobility walks Low fall risk   Focused Assessments Cardiac Assessment Handoff:  Cardiac Rhythm: Atrial fibrillation Lab Results  Component Value Date   TROPONINI <0.03 09/26/2018   Lab Results  Component Value Date   DDIMER 0.33 01/04/2018   Does the Patient  currently have chest pain? No     R Recommendations: See Admitting Provider Note  Report given to:   Additional Notes:
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
## 2019-05-26 NOTE — Progress Notes (Signed)
Echocardiogram 2D Echocardiogram has been performed.  Oneal Deputy Chala Gul 05/26/2019, 10:54 AM

## 2019-05-26 NOTE — Progress Notes (Addendum)
PROGRESS NOTE  Kyle Dawson EXH:[id number] DOB: 05/28/1965 DOA: 05/25/2019 PCP: Elby Showers, MD  HPI/Recap of past 24 hours: Per admission HPI: Kyle Dawson is a 54 y.o. male with medical history significant of A. fib, type 2 diabetes, hypertension, hyperlipidemia, alcohol use disorder, history of squamous cell carcinoma presenting to the ED via EMS with a chief complaint of chest pain.  Patient states he is an "alcoholic" and has been drinking since 2016.  On 1/4 he started binge drinking and drank 2 gallons of bourbon in about a week.  After that week he started dry heaving and was not able to tolerate any p.o. intake.  He was having pain in his esophagus when he tried to swallow anything.  Also having epigastric abdominal pain.  No hematemesis, hematochezia, or melena.  Symptoms improved after he took probiotics and some over-the-counter medications and he was able to tolerate drinking some smoothies.  For the past 3 days he has noticed that he is having left-sided aching chest pain which is present even at rest.  No heart palpitations.  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  Since then he has not been able to take any of his medications.  He also has anxiety and depression but has not been able to see a psychiatrist and is not on any medications.  Currently visiting a counselor.  Denies suicidal ideation.  States he has not consumed any alcohol since 1/11.  Subjective: Patient seen and examined at bedside he is complaining of epigastric pain and that the Protonix is not helping.  Also he is concerned that his heart rate goes up fine he gets off   Assessment/Plan: Principal Problem:   Atrial fibrillation with rapid ventricular response (HCC) Active Problems:   Alcohol use   Abdominal pain   Odynophagia   Demand ischemia (HCC)   Chronic post-traumatic stress disorder (PTSD)  #1 atrial fibrillation with rapid ventricular rate currently on Cardizem drip Patient  admitted with proximal atrial fibrillation currently on Cardizem drip still has tachycardia with mild exertion like getting up out of bed.  Also blood pressure is uncontrolled.  I have consulted with cardiology.  They recommend leaving him on Cardizem drip at 50 mics per minute restarting his home metoprolol 50 mg once daily also he was on Avapro 300 mg they recommend starting it at 150 they will continue to follow  2.  GERD patient was started on PPI he is still having symptoms so I have added sucralfate   3.  Uncontrolled hypertension restarting his home medications include Avapro and metoprolol he will continue his Cardizem drip for right now  4.  History of alcohol abuse continue CIWA monitoring  5.  Depression continue fluoxetine  Code Status: Full  Severity of Illness: The appropriate patient status for this patient is INPATIENT. Inpatient status is judged to be reasonable and necessary in order to provide the required intensity of service to ensure the patient's safety. The patient's presenting symptoms, physical exam findings, and initial radiographic and laboratory data in the context of their chronic comorbidities is felt to place them at high risk for further clinical deterioration. Furthermore, it is not anticipated that the patient will be medically stable for discharge from the hospital within 2 midnights of admission. The following factors support the patient status of inpatient.   " Patient admitted with proximal atrial fibrillation currently on Cardizem drip still has tachycardia with mild exertion like getting up out  of bed.  Also blood pressure is uncontrolled.  I have consulted with cardiology * I certify that at the point of admission it is my clinical judgment that the patient will require inpatient hospital care spanning beyond 2 midnights from the point of admission due to high intensity of service, high risk for further deterioration and high frequency of surveillance  required.*    Family Communication: Discussed with patient  Disposition Plan: Home when stable   Consultants:  Cardiology  Procedures:  None  Antimicrobials:  None  DVT prophylaxis: Heparin   Objective: Vitals:   05/26/19 0348 05/26/19 0739 05/26/19 1138 05/26/19 1140  BP: (!) 156/103 (!) 141/82 (!) 158/113 (!) 154/109  Pulse: 74 (!) 109 90 (!) 105  Resp: 18 18  16   Temp: (!) 97.3 F (36.3 C) 97.7 F (36.5 C)  98 F (36.7 C)  TempSrc: Oral Oral  Oral  SpO2: 99% 98% 97% 97%  Weight: 99.7 kg       Intake/Output Summary (Last 24 hours) at 05/26/2019 1254 Last data filed at 05/26/2019 0749 Gross per 24 hour  Intake --  Output 425 ml  Net -425 ml   Filed Weights   05/26/19 0348  Weight: 99.7 kg   Body mass index is 30.66 kg/m.  Exam:  . General: 54 y.o. year-old male well developed well nourished in no acute distress.  Alert and oriented x3. . Cardiovascular: Regular rate and rhythm with no rubs or gallops.  No thyromegaly or JVD noted.   Marland Kitchen Respiratory: Clear to auscultation with no wheezes or rales. Good inspiratory effort. . Abdomen: Soft epigastric tenderness nondistended with normal bowel sounds x4 quadrants. . Musculoskeletal: No lower extremity edema. 2/4 pulses in all 4 extremities. . Skin: No ulcerative lesions noted or rashes, . Psychiatry: Mood is appropriate for condition and setting    Data Reviewed: CBC: Recent Labs  Lab 05/25/19 2029  WBC 8.5  NEUTROABS 5.9  HGB 18.4*  HCT 51.1  MCV 89.3  PLT 268   Basic Metabolic Panel: Recent Labs  Lab 05/25/19 2029  NA 131*  K 3.5  CL 92*  CO2 23  GLUCOSE 254*  BUN 37*  CREATININE 1.36*  CALCIUM 9.9  MG 1.8   GFR: CrCl cannot be calculated (Unknown ideal weight.). Liver Function Tests: Recent Labs  Lab 05/25/19 2029  AST 29  ALT 44  ALKPHOS 55  BILITOT 2.2*  PROT 6.6  ALBUMIN 3.7   Recent Labs  Lab 05/25/19 2050  LIPASE 43   No results for input(s): AMMONIA in the  last 168 hours. Coagulation Profile: No results for input(s): INR, PROTIME in the last 168 hours. Cardiac Enzymes: No results for input(s): CKTOTAL, CKMB, CKMBINDEX, TROPONINI in the last 168 hours. BNP (last 3 results) No results for input(s): PROBNP in the last 8760 hours. HbA1C: Recent Labs    05/26/19 0434  HGBA1C 9.2*   CBG: Recent Labs  Lab 05/26/19 0734 05/26/19 1140  GLUCAP 237* 179*   Lipid Profile: Recent Labs    05/26/19 0434  CHOL 213*  HDL 79  LDLCALC 106*  TRIG 142  CHOLHDL 2.7   Thyroid Function Tests: No results for input(s): TSH, T4TOTAL, FREET4, T3FREE, THYROIDAB in the last 72 hours. Anemia Panel: No results for input(s): VITAMINB12, FOLATE, FERRITIN, TIBC, IRON, RETICCTPCT in the last 72 hours. Urine analysis:    Component Value Date/Time   COLORURINE YELLOW 11/27/2017 1330   APPEARANCEUR CLEAR 11/27/2017 1330   LABSPEC 1.023 11/27/2017 1330  PHURINE 7.0 11/27/2017 1330   GLUCOSEU >=500 (A) 11/27/2017 1330   HGBUR NEGATIVE 11/27/2017 1330   BILIRUBINUR NEGATIVE 11/27/2017 1330   BILIRUBINUR negative 04/28/2016 1153   KETONESUR 5 (A) 11/27/2017 1330   PROTEINUR NEGATIVE 11/27/2017 1330   UROBILINOGEN negative 04/28/2016 1153   NITRITE NEGATIVE 11/27/2017 1330   LEUKOCYTESUR NEGATIVE 11/27/2017 1330   Sepsis Labs: @LABRCNTIP (procalcitonin:4,lacticidven:4)  ) Recent Results (from the past 240 hour(s))  SARS CORONAVIRUS 2 (TAT 6-24 HRS) Nasopharyngeal Nasopharyngeal Swab     Status: None   Collection Time: 05/25/19  8:50 PM   Specimen: Nasopharyngeal Swab  Result Value Ref Range Status   SARS Coronavirus 2 NEGATIVE NEGATIVE Final    Comment: (NOTE) SARS-CoV-2 target nucleic acids are NOT DETECTED. The SARS-CoV-2 RNA is generally detectable in upper and lower respiratory specimens during the acute phase of infection. Negative results do not preclude SARS-CoV-2 infection, do not rule out co-infections with other pathogens, and should  not be used as the sole basis for treatment or other patient management decisions. Negative results must be combined with clinical observations, patient history, and epidemiological information. The expected result is Negative. Fact Sheet for Patients: SugarRoll.be Fact Sheet for Healthcare Providers: https://www.woods-mathews.com/ This test is not yet approved or cleared by the [place] FDA and  has been authorized for detection and/or diagnosis of SARS-CoV-2 by FDA under an Emergency Use Authorization (EUA). This EUA will remain  in effect (meaning this test can be used) for the duration of the COVID-19 declaration under Section 56 4(b)(1) of the Act, 21 U.S.C. section 360bbb-3(b)(1), unless the authorization is terminated or revoked sooner. Performed at [facility], [place] [address] [place], [place] [address]       Studies: DG Chest Port 1 View  Result Date: 05/25/2019 CLINICAL DATA:  Chest pain for 2 days EXAM: PORTABLE CHEST 1 VIEW COMPARISON:  None. FINDINGS: The heart size and mediastinal contours are within normal limits. Both lungs are clear. The visualized skeletal structures are unremarkable. IMPRESSION: No active disease. Electronically Signed   By: Inez Catalina M.D.   On: 05/25/2019 21:25   US Abdomen Limited RUQ  Result Date: 05/26/2019 CLINICAL DATA:  Increased LFTs EXAM: ULTRASOUND ABDOMEN LIMITED RIGHT UPPER QUADRANT COMPARISON:  None. FINDINGS: Gallbladder: No gallstones or wall thickening visualized. No sonographic Murphy sign noted by sonographer. Common bile duct: Diameter: Normal at 4 mm Liver: Increased liver echogenicity. No duct dilatation. Portal vein is patent on color Doppler imaging with normal direction of blood flow towards the liver. Other: None. IMPRESSION: 1. Normal gallbladder and common bile duct. 2. Increased liver echogenicity commonly represents hepatic steatosis. Electronically Signed   By: Suzy Bouchard M.D.   On: 05/26/2019 06:08    Scheduled Meds: . FLUoxetine  20 mg Oral Daily  . folic acid  1 mg Oral Daily  . [START ON 05/27/2019] influenza vac split quadrivalent PF  0.5 mL Intramuscular Tomorrow-1000  . insulin aspart  0-5 Units Subcutaneous QHS  . insulin aspart  0-9 Units Subcutaneous TID WC  . multivitamin with minerals  1 tablet Oral Daily  . pantoprazole (PROTONIX) IV  40 mg Intravenous Q12H  . sucralfate  1 g Oral TID WC & HS  . thiamine  100 mg Oral Daily   Or  . thiamine  100 mg Intravenous Daily    Continuous Infusions: . diltiazem (CARDIZEM) infusion 15 mg/hr (05/26/19 0328)  . heparin 1,200 Units/hr (05/26/19 0704)  . lactated ringers 125 mL/hr at  05/25/19 2220     LOS: 1 day     Cristal Deer, MD [facility]  To reach me or the doctor on call, go to: [email or web address] Password [facility]  05/26/2019, 12:54 PM

## 2019-05-26 NOTE — Progress Notes (Signed)
ANTICOAGULATION CONSULT NOTE - Initial Consult  Pharmacy Consult for Heparin  Indication: atrial fibrillation  Allergies  Allergen Reactions  . Oxycodone Anxiety and Other (See Comments)    "I drank it with beer and it made me hyper"    Patient Measurements: Weight: 219 lb 12.8 oz (99.7 kg)  Vital Signs: Temp: 97.3 F (36.3 C) (01/16 0348) Temp Source: Oral (01/16 0348) BP: 156/103 (01/16 0348) Pulse Rate: 74 (01/16 0348)  Labs: Recent Labs    05/25/19 0015 05/25/19 2029  HGB  --  18.4*  HCT  --  51.1  PLT  --  176  CREATININE  --  1.36*  TROPONINIHS 39* 47*    CrCl cannot be calculated (Unknown ideal weight.).   Medical History: Past Medical History:  Diagnosis Date  . Basal cell carcinoma   . Chest pain   . Diabetes mellitus without complication ([place])   . Edema    lower extremity  . Hypertension   . Squamous cell carcinoma    R ear, nose, each side of face    Assessment: 54 y/o M with afib to start heparin, previously on Xarelto at home but hasn't taken in about 6 months, had been taking high dose aspirin instead. CBC good. Scr 1.36.   Goal of Therapy:  Heparin level 0.3-0.7 units/ml Monitor platelets by anticoagulation protocol: Yes   Plan:  Heparin 5000 units BOLUS Start heparin drip at 1200 units/hr 1500 HL Daily CBC/HL Monitor for bleeding  Narda Bonds, PharmD, BCPS Clinical Pharmacist Phone: [phone]

## 2019-05-26 NOTE — H&P (Signed)
History and Physical    MEHUL RUDIN MEQ:[id number] DOB: 1965-12-29 DOA: 05/25/2019  PCP: Elby Showers, MD Patient coming from: Home  Chief Complaint: Chest pain  HPI: Kyle Dawson is a 54 y.o. male with medical history significant of A. fib, type 2 diabetes, hypertension, hyperlipidemia, alcohol use disorder, history of squamous cell carcinoma presenting to the ED via EMS with a chief complaint of chest pain.  Patient states he is an "alcoholic" and has been drinking since 2016.  On 1/4 he started binge drinking and drank 2 gallons of bourbon in about a week.  After that week he started dry heaving and was not able to tolerate any p.o. intake.  He was having pain in his esophagus when he tried to swallow anything.  Also having epigastric abdominal pain.  No hematemesis, hematochezia, or melena.  Symptoms improved after he took probiotics and some over-the-counter medications and he was able to tolerate drinking some smoothies.  For the past 3 days he has noticed that he is having left-sided aching chest pain which is present even at rest.  No heart palpitations.  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  Since then he has not been able to take any of his medications.  He also has anxiety and depression but has not been able to see a psychiatrist and is not on any medications.  Currently visiting a counselor.  Denies suicidal ideation.  States he has not consumed any alcohol since 1/11.  ED Course: Noted to be in A. fib with RVR, heart rate 140s to 160s.  Labs showing no leukocytosis.  Hemoglobin 18.4.  Blood glucose 254.  Creatinine 1.3, at baseline.  Lipase normal.  T bili 2.2, remainder of LFTs normal.  High-sensitivity troponin 39 >47.  Magnesium normal.  SARS-CoV-2 PCR test negative.  Chest x-ray showing no active disease. Received Cardizem bolus and started on infusion.  Received IV Protonix 40 mg.  Review of Systems:  All systems reviewed and apart from history of presenting  illness, are negative.  Past Medical History:  Diagnosis Date  . Basal cell carcinoma   . Chest pain   . Diabetes mellitus without complication ([place])   . Edema    lower extremity  . Hypertension   . Squamous cell carcinoma    R ear, nose, each side of face    Past Surgical History:  Procedure Laterality Date  . Removal basal cell carcinoma    . Removal squamous cell carcinoma       reports that he has quit smoking. He has never used smokeless tobacco. He reports current alcohol use. He reports that he does not use drugs.  Allergies  Allergen Reactions  . Oxycodone Anxiety and Other (See Comments)    "I drank it with beer and it made me hyper"    Family History  Problem Relation Age of Onset  . Peripheral vascular disease Mother 71  . Hypertension Mother   . Heart failure Mother   . CAD Maternal Grandfather   . Heart failure Paternal Grandfather   . Hypertension Father     Prior to Admission medications   Medication Sig Start Date End Date Taking? Authorizing Provider  Ascorbic Acid (VITAMIN C) 1000 MG tablet Take 2,000 mg by mouth daily.   Yes [provider]  ASHWAGANDHA PO Take 1 capsule by mouth daily.   Yes [provider]  aspirin EC 325 MG tablet Take 650 mg by mouth  See admin instructions. Take 650 mg by mouth one to three times daily   Yes [provider]  B Complex Vitamins (VITAMIN B COMPLEX PO) Take 2 tablets by mouth daily.    Yes [provider]  Bilberry, Vaccinium myrtillus, (BILBERRY EXTRACT PO) Take 1 capsule by mouth daily.   Yes [provider]  Cholecalciferol (VITAMIN D3) 50 MCG (2000 UT) TABS Take 4,000 Units by mouth daily.   Yes [provider]  COLLAGEN PO Take 1 capsule by mouth daily.   Yes [provider]  DHEA 50 MG CAPS Take 50 mg by mouth daily.   Yes [provider]  FENUGREEK PO Take 1 Scoop by mouth daily. MIX and drink   Yes [provider]  GINSENG PO  Take 1 capsule by mouth daily.   Yes [provider]  L-GLUTAMINE PO Take 1 capsule by mouth daily.   Yes [provider]  VALERIAN ROOT PO Take 1 capsule by mouth daily.   Yes [provider]  VITAMIN A PO Take 1 capsule by mouth daily.   Yes [provider]  vitamin E (VITAMIN E) 180 MG (400 UNITS) capsule Take 800 Units by mouth daily.   Yes [provider]  zinc gluconate 50 MG tablet Take 50 mg by mouth daily.   Yes [provider]  amLODipine (NORVASC) 10 MG tablet Take 1 tablet (10 mg total) by mouth daily. Patient not taking: Reported on 05/25/2019 10/13/18   Elby Showers, MD  atorvastatin (LIPITOR) 10 MG tablet Take 1 tablet (10 mg total) by mouth daily. Patient not taking: Reported on 05/25/2019 11/07/18 05/25/19  Richardson Dopp T, PA-C  COD LIVER OIL PO Take by mouth daily.    [provider]  Docosahexaenoic Acid (DHA PO) Take by mouth at bedtime.    [provider]  empagliflozin (JARDIANCE) 25 MG TABS tablet Take 25 mg by mouth daily. Patient not taking: Reported on 05/25/2019 10/23/18   Elayne Snare, MD  folic acid (FOLVITE) 1 MG tablet Take 1 tablet (1 mg total) by mouth daily. Patient not taking: Reported on 05/25/2019 09/27/18   Kayleen Memos, DO  furosemide (LASIX) 40 MG tablet Take 0.5 tablets (20 mg total) by mouth daily. Patient not taking: Reported on 05/25/2019 10/31/18   Richardson Dopp T, PA-C  irbesartan (AVAPRO) 300 MG tablet Take 1 tablet (300 mg total) by mouth daily. Patient not taking: Reported on 05/25/2019 10/13/18   Elby Showers, MD  JARDIANCE 10 MG TABS tablet Take 10 mg by mouth daily. 02/09/19   [provider]  metoprolol succinate (TOPROL-XL) 50 MG 24 hr tablet Take 1 tablet (50 mg total) by mouth 2 (two) times a day. Take with or immediately following a meal. Patient not taking: Reported on 05/25/2019 10/13/18   Elby Showers, MD  Multiple Vitamin (MULTIVITAMIN WITH MINERALS) TABS tablet  Take 1 tablet by mouth daily. Patient not taking: Reported on 05/25/2019 09/27/18   Kayleen Memos, DO  Omega-3 Fatty Acids (EPA PO) Take by mouth at bedtime.    [provider]  Omega-3 Fatty Acids (FISH OIL PO) Take 1 tablet by mouth daily.     [provider]  OneTouch Delica Lancets 75T [place] USE TO [place] BLOOD SUGAR ONCE DAILY 11/01/18   Elayne Snare, MD  [facility] VERIO test strip USE AS DIRECTED TO CHECK BLOOD SUGAR ONCE DAILY Patient taking differently: 1 each by Other route daily.  11/01/18  Elayne Snare, MD  rivaroxaban (XARELTO) 20 MG TABS tablet Take 1 tablet (20 mg total) by mouth daily with supper. Patient not taking: Reported on 05/25/2019 12/18/18   Elby Showers, MD  Semaglutide (RYBELSUS) 3 MG TABS Take 3 mg by mouth daily before breakfast. Patient not taking: Reported on 05/25/2019 12/18/18   Elayne Snare, MD  sitaGLIPtin (JANUVIA) 100 MG tablet Take 1 tablet (100 mg total) by mouth daily. Take 1 tablet by mouth once daily. Patient not taking: Reported on 05/25/2019 11/27/18   Elayne Snare, MD  thiamine 100 MG tablet Take 1 tablet (100 mg total) by mouth daily. Patient not taking: Reported on 05/25/2019 09/27/18   Kayleen Memos, DO    Physical Exam: Vitals:   05/26/19 0115 05/26/19 0130 05/26/19 0145 05/26/19 0348  BP: (!) 144/102 (!) 144/109 (!) 146/89 (!) 156/103  Pulse: (!) 113 97 64 74  Resp: 17 20 15 18   Temp:    (!) 97.3 F (36.3 C)  TempSrc:    Oral  SpO2: 95% 96% 97% 99%  Weight:    99.7 kg    Physical Exam  Constitutional: He is oriented to person, place, and time. He appears well-developed and well-nourished. No distress.  HENT:  Head: Normocephalic.  Eyes: Right eye exhibits no discharge. Left eye exhibits no discharge.  Cardiovascular: Normal rate, regular rhythm and intact distal pulses.  Pulmonary/Chest: Effort normal and breath sounds normal. No respiratory distress. He has no wheezes. He has no rales.  Abdominal: Soft. Bowel sounds are  normal. He exhibits no distension. There is abdominal tenderness. There is no rebound and no guarding.  Epigastrium tender to palpation  Musculoskeletal:        General: No edema.     Cervical back: Neck supple.  Neurological: He is alert and oriented to person, place, and time.  Skin: Skin is warm and dry. He is not diaphoretic.   Labs on Admission: I have personally reviewed following labs and imaging studies  CBC: Recent Labs  Lab 05/25/19 2029  WBC 8.5  NEUTROABS 5.9  HGB 18.4*  HCT 51.1  MCV 89.3  PLT 376   Basic Metabolic Panel: Recent Labs  Lab 05/25/19 2029  NA 131*  K 3.5  CL 92*  CO2 23  GLUCOSE 254*  BUN 37*  CREATININE 1.36*  CALCIUM 9.9  MG 1.8   GFR: CrCl cannot be calculated (Unknown ideal weight.). Liver Function Tests: Recent Labs  Lab 05/25/19 2029  AST 29  ALT 44  ALKPHOS 55  BILITOT 2.2*  PROT 6.6  ALBUMIN 3.7   Recent Labs  Lab 05/25/19 2050  LIPASE 43   No results for input(s): AMMONIA in the last 168 hours. Coagulation Profile: No results for input(s): INR, PROTIME in the last 168 hours. Cardiac Enzymes: No results for input(s): CKTOTAL, CKMB, CKMBINDEX, TROPONINI in the last 168 hours. BNP (last 3 results) No results for input(s): PROBNP in the last 8760 hours. HbA1C: Recent Labs    05/26/19 0434  HGBA1C 9.2*   CBG: No results for input(s): GLUCAP in the last 168 hours. Lipid Profile: Recent Labs    05/26/19 0434  CHOL 213*  HDL 79  LDLCALC 106*  TRIG 142  CHOLHDL 2.7   Thyroid Function Tests: No results for input(s): TSH, T4TOTAL, FREET4, T3FREE, THYROIDAB in the last 72 hours. Anemia Panel: No results for input(s): VITAMINB12, FOLATE, FERRITIN, TIBC, IRON, RETICCTPCT in the last 72 hours. Urine analysis:    Component Value Date/Time  COLORURINE YELLOW 11/27/2017 1330   APPEARANCEUR CLEAR 11/27/2017 1330   LABSPEC 1.023 11/27/2017 1330   PHURINE 7.0 11/27/2017 1330   GLUCOSEU >=500 (A) 11/27/2017 1330     HGBUR NEGATIVE 11/27/2017 1330   BILIRUBINUR NEGATIVE 11/27/2017 1330   BILIRUBINUR negative 04/28/2016 1153   KETONESUR 5 (A) 11/27/2017 1330   PROTEINUR NEGATIVE 11/27/2017 1330   UROBILINOGEN negative 04/28/2016 1153   NITRITE NEGATIVE 11/27/2017 1330   LEUKOCYTESUR NEGATIVE 11/27/2017 1330    Radiological Exams on Admission: DG Chest Port 1 View  Result Date: 05/25/2019 CLINICAL DATA:  Chest pain for 2 days EXAM: PORTABLE CHEST 1 VIEW COMPARISON:  None. FINDINGS: The heart size and mediastinal contours are within normal limits. Both lungs are clear. The visualized skeletal structures are unremarkable. IMPRESSION: No active disease. Electronically Signed   By: Inez Catalina M.D.   On: 05/25/2019 21:25   US Abdomen Limited RUQ  Result Date: 05/26/2019 CLINICAL DATA:  Increased LFTs EXAM: ULTRASOUND ABDOMEN LIMITED RIGHT UPPER QUADRANT COMPARISON:  None. FINDINGS: Gallbladder: No gallstones or wall thickening visualized. No sonographic Murphy sign noted by sonographer. Common bile duct: Diameter: Normal at 4 mm Liver: Increased liver echogenicity. No duct dilatation. Portal vein is patent on color Doppler imaging with normal direction of blood flow towards the liver. Other: None. IMPRESSION: 1. Normal gallbladder and common bile duct. 2. Increased liver echogenicity commonly represents hepatic steatosis. Electronically Signed   By: Suzy Bouchard M.D.   On: 05/26/2019 06:08    EKG: Independently reviewed.  A. fib with RVR, heart rate 161.  PVCs.  Assessment/Plan Principal Problem:   Atrial fibrillation with rapid ventricular response (HCC) Active Problems:   Alcohol use   Abdominal pain   Odynophagia   Demand ischemia (HCC)   A. fib with RVR Known history of A. fib and has been off of all medications for 6 months due to lack of insurance.  Heart rate in the 140s to 160s on arrival to the ED.  Received Cardizem bolus and infusion, continues to be in A. fib but heart rate  improved. -Cardiac monitoring -Continue Cardizem bolus and infusion -Heparin for anticoagulation -Echocardiogram  Epigastric abdominal pain, odynophagia Patient has a longstanding history of alcohol use.  Recently binge drinking and has been taking high doses of aspirin at home. Patient has been taking aspirin 625 mg 1-3 doses daily for the past 2 weeks at home.  At risk for esophagitis, gastritis, and peptic ulcer disease.  Denies hematemesis or melena.  Has epigastric tenderness on exam but no peritoneal signs.  Appears comfortable. -IV PPI -Will need GI evaluation for EGD  Elevated T bili History of alcohol use.  T bili 2.2, remainder of LFTs normal.  -Right upper quadrant ultrasound  Demand ischemia High-sensitivity troponin mildly elevated (39 >47) in the setting of A. fib with RVR.  -Cardiac monitoring -Continue to trend troponin  Type 2 diabetes Been off of medications for 6 months. -Check A1c.  Sliding scale insulin sensitive ACHS and CBG checks.  Hypertension Blood pressure slightly elevated. -Continue Cardizem as above  Hyperlipidemia -Check lipid panel  Alcohol use disorder Patient states he has been drinking alcohol regularly since 2016 and has been binge drinking recently.  States he has not consumed any alcohol since 1/11.   -CIWA protocol; Ativan as needed -Thiamine, folate, multivitamin  Depression and anxiety Denies suicidal ideation. -Inpatient psychiatry consult  DVT prophylaxis: Heparin Code Status: Full code Family Communication: No family at bedside. Disposition Plan: Anticipate discharge after  clinical improvement. Consults called: None Admission status: It is my clinical opinion that admission to INPATIENT is reasonable and necessary in this 54 y.o. male . presenting with A. fib with RVR, requiring Cardizem infusion for rate control.  Given the aforementioned, the predictability of an adverse outcome is felt to be significant. I expect that the  patient will require at least 2 midnights in the hospital to treat this condition.   The medical decision making on this patient was of high complexity and the patient is at high risk for clinical deterioration, therefore this is a level 3 visit.  Shela Leff MD [facility] Pager [phone]  If 7PM-7AM, please contact night-coverage [email or web address] Password [email or web address]  05/26/2019, 7:00 AM

## 2019-05-26 NOTE — Progress Notes (Signed)
Pt very concerned about his heart rate especially when he is standing up. He is requesting to to speak with attending MD. Dr. Kyung Bacca text paged via [place].

## 2019-05-26 NOTE — Consult Note (Signed)
Cardiology Consultation:   Patient ID: Kyle Dawson MRN: [id number]; DOB: 01/16/1966  Admit date: 05/25/2019 Date of Consult: 05/26/2019  Primary Care Provider: Elby Showers, MD Primary Cardiologist: Dorris Carnes, MD  Primary Electrophysiologist:  None    Patient Profile:   Kyle Dawson is a 54 y.o. male with pAF (previously on Eliquis; off AC prior to admission d/t insurance), T2DM, HTN, HLD, and EtOh use disorder who presented with CP pressure found to have recurrent AF w/ RVR.   History of Present Illness:   Kyle Dawson was in his usual state of health until Monday (1/11) this week when - after a period of heavy drinking - he developed increased abdominal pain, nausea, and vomiting. These symptoms self-resolved, but were followed by intermittent left sided chest pressure that waxed and waned. Today, the pain became more constant and in checking his BP he noted it to be elevated along with a rapid heart rate, for which he called EMS.  Upon arrival in the ER, he was noted to be in AF with RVR ~ 140-160. hsTn 39 > 47. COVID negative. He was given a Cardizem bolus and started on a cardizem drip which was uptitrated to 15 mg/hr.   On the floor, patient noted to be well rate controlled at rest (90-110) but with increase in HR to 140s with movement from bed to chair, etc. Denies associated palpitations, chest pain, or SOB. Blood pressures have been systolic 323F. Echo done today with LVEF 65%, no LAE, persistent L SVC, RAP ~ 36mHg.   Of note, patient has been off all medications ~ 6 months due to insurance issues. He thinks he has been in AF for several days, noting that has felt increased lightheadedness, DOE, and decreased exercise tolerance.   Past Medical History:  Diagnosis Date  . Basal cell carcinoma   . Chest pain   . Diabetes mellitus without complication ([place]olo   . Edema    lower extremity  . Hypertension   . Squamous cell carcinoma    R ear, nose, each side of face     Past Surgical History:  Procedure Laterality Date  . Removal basal cell carcinoma    . Removal squamous cell carcinoma       Home Medications:  Prior to Admission medications   Medication Sig Start Date End Date Taking? Authorizing Provider  Ascorbic Acid (VITAMIN C) 1000 MG tablet Take 2,000 mg by mouth daily.   Yes [provider]  ASHWAGANDHA PO Take 1 capsule by mouth daily.   Yes [provider]  aspirin EC 325 MG tablet Take 650 mg by mouth See admin instructions. Take 650 mg by mouth one to three times daily   Yes [provider]  B Complex Vitamins (VITAMIN B COMPLEX PO) Take 2 tablets by mouth daily.    Yes [provider]  Bilberry, Vaccinium myrtillus, (BILBERRY EXTRACT PO) Take 1 capsule by mouth daily.   Yes [provider]  Cholecalciferol (VITAMIN D3) 50 MCG (2000 UT) TABS Take 4,000 Units by mouth daily.   Yes [provider]  COLLAGEN PO Take 1 capsule by mouth daily.   Yes [provider]  DHEA 50 MG CAPS Take 50 mg by mouth daily.   Yes [provider]  FENUGREEK PO Take 1 Scoop by mouth daily. MIX and drink   Yes [provider]  GINSENG PO Take 1 capsule by mouth daily.   Yes [provider]  L-GLUTAMINE PO  Take 1 capsule by mouth daily.   Yes [provider]  VALERIAN ROOT PO Take 1 capsule by mouth daily.   Yes [provider]  VITAMIN A PO Take 1 capsule by mouth daily.   Yes [provider]  vitamin E (VITAMIN E) 180 MG (400 UNITS) capsule Take 800 Units by mouth daily.   Yes [provider]  zinc gluconate 50 MG tablet Take 50 mg by mouth daily.   Yes [provider]  amLODipine (NORVASC) 10 MG tablet Take 1 tablet (10 mg total) by mouth daily. Patient not taking: Reported on 05/25/2019 10/13/18   Elby Showers, MD  atorvastatin (LIPITOR) 10 MG tablet Take 1 tablet (10 mg total) by mouth daily. Patient not taking: Reported on  05/25/2019 11/07/18 05/25/19  Richardson Dopp T, PA-C  COD LIVER OIL PO Take by mouth daily.    [provider]  Docosahexaenoic Acid (DHA PO) Take by mouth at bedtime.    [provider]  empagliflozin (JARDIANCE) 25 MG TABS tablet Take 25 mg by mouth daily. Patient not taking: Reported on 05/25/2019 10/23/18   Elayne Snare, MD  folic acid (FOLVITE) 1 MG tablet Take 1 tablet (1 mg total) by mouth daily. Patient not taking: Reported on 05/25/2019 09/27/18   Kayleen Memos, DO  furosemide (LASIX) 40 MG tablet Take 0.5 tablets (20 mg total) by mouth daily. Patient not taking: Reported on 05/25/2019 10/31/18   Richardson Dopp T, PA-C  irbesartan (AVAPRO) 300 MG tablet Take 1 tablet (300 mg total) by mouth daily. Patient not taking: Reported on 05/25/2019 10/13/18   Elby Showers, MD  metoprolol succinate (TOPROL-XL) 50 MG 24 hr tablet Take 1 tablet (50 mg total) by mouth 2 (two) times a day. Take with or immediately following a meal. Patient not taking: Reported on 05/25/2019 10/13/18   Elby Showers, MD  Multiple Vitamin (MULTIVITAMIN WITH MINERALS) TABS tablet Take 1 tablet by mouth daily. Patient not taking: Reported on 05/25/2019 09/27/18   Kayleen Memos, DO  Omega-3 Fatty Acids (FISH OIL PO) Take 1 tablet by mouth daily.     [provider]  OneTouch Delica Lancets 16X [place] USE TO [place] BLOOD SUGAR ONCE DAILY 11/01/18   Elayne Snare, MD  [facility] VERIO test strip USE AS DIRECTED TO CHECK BLOOD SUGAR ONCE DAILY Patient taking differently: 1 each by Other route daily.  11/01/18   Elayne Snare, MD  rivaroxaban (XARELTO) 20 MG TABS tablet Take 1 tablet (20 mg total) by mouth daily with supper. Patient not taking: Reported on 05/25/2019 12/18/18   Elby Showers, MD  Semaglutide (RYBELSUS) 3 MG TABS Take 3 mg by mouth daily before breakfast. Patient not taking: Reported on 05/25/2019 12/18/18   Elayne Snare, MD  sitaGLIPtin (JANUVIA) 100 MG tablet Take 1 tablet (100 mg total) by mouth daily. Take  1 tablet by mouth once daily. Patient not taking: Reported on 05/25/2019 11/27/18   Elayne Snare, MD  thiamine 100 MG tablet Take 1 tablet (100 mg total) by mouth daily. Patient not taking: Reported on 05/25/2019 09/27/18   Kayleen Memos, DO    Inpatient Medications: Scheduled Meds: . FLUoxetine  20 mg Oral Daily  . folic acid  1 mg Oral Daily  . [START ON 05/27/2019] influenza vac split quadrivalent PF  0.5 mL Intramuscular Tomorrow-1000  . insulin aspart  0-5 Units Subcutaneous QHS  . insulin aspart  0-9 Units Subcutaneous TID WC  . multivitamin with  minerals  1 tablet Oral Daily  . pantoprazole (PROTONIX) IV  40 mg Intravenous Q12H  . sucralfate  1 g Oral TID WC & HS  . thiamine  100 mg Oral Daily   Or  . thiamine  100 mg Intravenous Daily   Continuous Infusions: . diltiazem (CARDIZEM) infusion 15 mg/hr (05/26/19 0328)  . heparin 1,300 Units/hr (05/26/19 1638)  . lactated ringers 125 mL/hr at 05/25/19 2220   PRN Meds: acetaminophen **OR** acetaminophen, LORazepam **OR** LORazepam, promethazine, traZODone  Allergies:    Allergies  Allergen Reactions  . Oxycodone Anxiety and Other (See Comments)    "I drank it with beer and it made me hyper"    Social History:   Social History   Socioeconomic History  . Marital status: Significant Other    Spouse name: Not on file  . Number of children: 0  . Years of education: ADN  . Highest education level: Not on file  Occupational History  . Occupation: ER [employer]    Employer: [employer]  . Occupation: [employer]  Tobacco Use  . Smoking status: Former Research scientist (life sciences)  . Smokeless tobacco: Never Used  Substance and Sexual Activity  . Alcohol use: Yes  . Drug use: No  . Sexual activity: Not on file  Other Topics Concern  . Not on file  Social History Narrative   Patient drinks 1 cup of coffee a day    Social Determinants of Health   Financial Resource Strain:   . Difficulty of Paying Living Expenses: Not on file  Food Insecurity:   .  Worried About Charity fundraiser in the Last Year: Not on file  . Ran Out of Food in the Last Year: Not on file  Transportation Needs:   . Lack of Transportation (Medical): Not on file  . Lack of Transportation (Non-Medical): Not on file  Physical Activity:   . Days of Exercise per Week: Not on file  . Minutes of Exercise per Session: Not on file  Stress:   . Feeling of Stress : Not on file  Social Connections:   . Frequency of Communication with Friends and Family: Not on file  . Frequency of Social Gatherings with Friends and Family: Not on file  . Attends Religious Services: Not on file  . Active Member of Clubs or Organizations: Not on file  . Attends Archivist Meetings: Not on file  . Marital Status: Not on file  Intimate Partner Violence:   . Fear of Current or Ex-Partner: Not on file  . Emotionally Abused: Not on file  . Physically Abused: Not on file  . Sexually Abused: Not on file    Family History:    Family History  Problem Relation Age of Onset  . Peripheral vascular disease Mother 18  . Hypertension Mother   . Heart failure Mother   . CAD Maternal Grandfather   . Heart failure Paternal Grandfather   . Hypertension Father      Review of Systems: [y] = yes, [ ]  = no     General: Weight gain [ ] ; Weight loss [ ] ; Anorexia [ ] ; Fatigue [ ] ; Fever [ ] ; Chills [ ] ; Weakness [ ]    Cardiac: Chest pain/pressure Blue.Reese ]; Resting SOB [ ] ; Exertional SOB [ ] ; Orthopnea [ ] ; Pedal Edema [ ] ; Palpitations [ ] ; Syncope [ ] ; Presyncope [ ] ; Paroxysmal nocturnal dyspnea[ ]    Pulmonary: Cough [ ] ; Wheezing[ ] ; Hemoptysis[ ] ; Sputum [ ] ; Snoring [ ]   GI: Vomiting[ y]; Dysphagia[ ] ; Melena[ ] ; Hematochezia [ ] ; Heartburn[ ] ; Abdominal pain [ ] ; Constipation [ ] ; Diarrhea [ ] ; BRBPR [ ]    GU: Hematuria[ ] ; Dysuria [ ] ; Nocturia[ ]    Vascular: Pain in legs with walking [ ] ; Pain in feet with lying flat [ ] ; Non-healing sores [ ] ; Stroke [ ] ; TIA [ ] ; Slurred speech [  ];   Neuro: Headaches[ ] ; Vertigo[ ] ; Seizures[ ] ; Paresthesias[ ] ;Blurred vision [ ] ; Diplopia [ ] ; Vision changes [ ]    Ortho/Skin: Arthritis [ ] ; Joint pain [ ] ; Muscle pain [ ] ; Joint swelling [ ] ; Back Pain [ ] ; Rash [ ]    Psych: Depression[ ] ; Anxiety[ ]    Heme: Bleeding problems [ ] ; Clotting disorders [ ] ; Anemia [ ]    Endocrine: Diabetes [ ] ; Thyroid dysfunction[ ]   Physical Exam/Data:   Vitals:   05/26/19 1138 05/26/19 1140 05/26/19 1531 05/26/19 1538  BP: (!) 158/113 (!) 154/109 (!) 166/107 (!) 160/93  Pulse: 90 (!) 105 87 92  Resp:  16    Temp:  98 F (36.7 C) 98.1 F (36.7 C)   TempSrc:  Oral Oral   SpO2: 97% 97% 95% 98%  Weight:        Intake/Output Summary (Last 24 hours) at 05/26/2019 1731 Last data filed at 05/26/2019 1500 Gross per 24 hour  Intake 120 ml  Output 700 ml  Net -580 ml   Filed Weights   05/26/19 0348  Weight: 99.7 kg   Body mass index is 30.66 kg/m.  General:  Well nourished, well developed, in no acute distress HEENT: normal Lymph: no adenopathy Neck: no JVD Endocrine:  No thryomegaly Vascular: No carotid bruits; FA pulses 2+ bilaterally without bruits  Cardiac:  Irregularly, irregular. Tachycardic. S1, S2; no murmur  Lungs:  clear to auscultation bilaterally, no wheezing, rhonchi or rales  Abd: soft, nontender, no hepatomegaly  Ext: no edema. Wwp.  Musculoskeletal:  No deformities, BUE and BLE strength normal and equal Skin: warm and dry  Psych:  Normal affect   EKG:  The EKG was personally reviewed and demonstrates:  AF w/ RVR 160s.  Telemetry:  Telemetry was personally reviewed and demonstrates:  AF with rates 100 - 140.   Relevant CV Studies: TTE 05/25/18: 1. Left ventricular ejection fraction, by visual estimation, is 60 to 65%. The left ventricle has normal function. There is moderately increased left ventricular hypertrophy.  2. Left ventricular diastolic function could not be evaluated.  3. The left ventricle has no  regional wall motion abnormalities.  4. Global right ventricle was not well visualized.The right ventricular size is not well visualized. Right vetricular wall thickness was not assessed.  5. Left atrial size was normal.  6. Right atrial size was normal.  7. The mitral valve is grossly normal. No evidence of mitral valve regurgitation.  8. The tricuspid valve is grossly normal.  9. The aortic valve was not well visualized. Aortic valve regurgitation is not visualized. 10. The pulmonic valve was grossly normal. Pulmonic valve regurgitation is not visualized. 11. 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 12. The inferior vena cava is normal in size with <50% respiratory variability, suggesting right atrial pressure of 8 mmHg. 13. The interatrial septum was not well visualized.  Laboratory Data:  Chemistry Recent Labs  Lab 05/25/19 2029  NA 131*  K 3.5  CL 92*  CO2 23  GLUCOSE 254*  BUN 37*  CREATININE 1.36*  CALCIUM 9.9  GFRNONAA 58*  GFRAA >60  ANIONGAP 16*    Recent Labs  Lab 05/25/19 2029  PROT 6.6  ALBUMIN 3.7  AST 29  ALT 44  ALKPHOS 55  BILITOT 2.2*   Hematology Recent Labs  Lab 05/25/19 2029  WBC 8.5  RBC 5.72  HGB 18.4*  HCT 51.1  MCV 89.3  MCH 32.2  MCHC 36.0  RDW 11.9  PLT 176   Cardiac EnzymesNo results for input(s): TROPONINI in the last 168 hours. No results for input(s): TROPIPOC in the last 168 hours.  BNPNo results for input(s): BNP, PROBNP in the last 168 hours.  DDimer No results for input(s): DDIMER in the last 168 hours.  Radiology/Studies:  CXR 05/25/18: FINDINGS: The heart size and mediastinal contours are within normal limits. Both lungs are clear. The visualized skeletal structures are unremarkable.  IMPRESSION: No active disease.  RUQ Ultrasound 05/25/18: IMPRESSION: 1. Normal gallbladder and common bile duct. 2. Increased liver echogenicity commonly represents  hepatic steatosis.  Assessment and Plan:   #AF w/ RVR -- Ok to continue Cardizem gtt overnight. Plan to transition to Diltiazem XL 360 mg qAM tomorrow morning if BP remains robust.  -- Add home metoprolol 25m XL tonight -- when feasible from a GI perspective, would transition heparin gtt to Apixiban (CHADS-2-Vasc = 3).  -- If he remains in AF w/ RVR, may need to consider TEE/cardioversion, but would absolutely need a GI eval prior to TEE.   #HTN -- Ok to restart ARB. Would restart half home dose Irbesartan 1555mto prioritize nodal agents as above. Can uptitrate as needed.   #Elevated hsTn -- Likely demand in the setting of AF w/ RVR. Delta 8.   #ASCVD Risk -- HbA1c 9.2% -- LDL 106.  -- Non-obstructive CAD by CTA in 2019. -- No ASA given NOAC as above.  -- Need aggressive risk factor modification.  -- Would reintroduce atorvastatin 1040mHS  For questions or updates, please contact [facility] consult [email or web address] for contact info under   Signed, LauMilus BanisterD  05/26/2019 5:31 PM

## 2019-05-27 DIAGNOSIS — I4891 Unspecified atrial fibrillation: Secondary | ICD-10-CM

## 2019-05-27 LAB — GLUCOSE, CAPILLARY
Glucose-Capillary: 225 mg/dL — ABNORMAL HIGH (ref 70–99)
Glucose-Capillary: 191 mg/dL — ABNORMAL HIGH (ref 70–99)
Glucose-Capillary: 201 mg/dL — ABNORMAL HIGH (ref 70–99)
Glucose-Capillary: 190 mg/dL — ABNORMAL HIGH (ref 70–99)

## 2019-05-27 LAB — BASIC METABOLIC PANEL
Anion gap: 15 (ref 5–15)
BUN: 15 mg/dL (ref 6–20)
CO2: 25 mmol/L (ref 22–32)
Calcium: 8.6 mg/dL — ABNORMAL LOW (ref 8.9–10.3)
Chloride: 95 mmol/L — ABNORMAL LOW (ref 98–111)
Creatinine, Ser: 0.98 mg/dL (ref 0.61–1.24)
GFR calc Af Amer: >60 mL/min (ref >60)
GFR calc non Af Amer: >60 mL/min (ref >60)
Glucose, Bld: 223 mg/dL — ABNORMAL HIGH (ref 70–99)
Potassium: 3.3 mmol/L — ABNORMAL LOW (ref 3.5–5.1)
Sodium: 135 mmol/L (ref 135–145)

## 2019-05-27 LAB — CBC
HCT: 44.6 % (ref 39.0–52.0)
Hemoglobin: 16.1 g/dL (ref 13.0–17.0)
MCH: 31.9 pg (ref 26.0–34.0)
MCHC: 36.1 g/dL — ABNORMAL HIGH (ref 30.0–36.0)
MCV: 88.5 fL (ref 80.0–100.0)
Platelets: 118 10*3/uL — ABNORMAL LOW (ref 150–400)
RBC: 5.04 MIL/uL (ref 4.22–5.81)
RDW: 11.9 % (ref 11.5–15.5)
WBC: 5.3 10*3/uL (ref 4.0–10.5)
nRBC: 0 % (ref 0.0–0.2)

## 2019-05-27 LAB — MAGNESIUM: Magnesium: 1.8 mg/dL (ref 1.7–2.4)

## 2019-05-27 LAB — HEPARIN LEVEL (UNFRACTIONATED): Heparin Unfractionated: 1.02 IU/mL — ABNORMAL HIGH (ref 0.30–0.70)

## 2019-05-27 MED ORDER — DILTIAZEM HCL ER COATED BEADS 240 MG PO CP24
240.0000 mg | ORAL_CAPSULE | Freq: Every day | ORAL | Status: DC
  Administered 2019-05-27 – 2019-05-28 (×2): 240 mg via ORAL
  Filled 2019-05-27 (×2): qty 1

## 2019-05-27 MED ORDER — HEPARIN (PORCINE) 25000 UT/250ML-% IV SOLN: 1150.0000 [IU]/h | INTRAVENOUS | Status: DC

## 2019-05-27 MED ORDER — CALCIUM CARBONATE ANTACID 500 MG PO CHEW: 1.0000 | CHEWABLE_TABLET | Freq: Three times a day (TID) | ORAL | Status: DC | PRN

## 2019-05-27 MED ORDER — APIXABAN 5 MG PO TABS
5.0000 mg | ORAL_TABLET | Freq: Two times a day (BID) | ORAL | Status: DC
  Administered 2019-05-27 – 2019-05-28 (×3): 5 mg via ORAL
  Filled 2019-05-27 (×3): qty 1

## 2019-05-27 MED ORDER — POTASSIUM CHLORIDE 20 MEQ PO PACK
40.0000 meq | PACK | Freq: Once | ORAL | Status: AC
  Administered 2019-05-27: 40 meq via ORAL
  Filled 2019-05-27: qty 2

## 2019-05-27 NOTE — Progress Notes (Addendum)
Progress Note  Patient Name: Kyle Dawson Date of Encounter: 05/27/2019  Primary Cardiologist: Dorris Carnes, MD   Subjective   Currently feeling improved with improved rate control and blood pressures.  Inpatient Medications    Scheduled Meds: . FLUoxetine  20 mg Oral Daily  . folic acid  1 mg Oral Daily  . influenza vac split quadrivalent PF  0.5 mL Intramuscular Tomorrow-1000  . insulin aspart  0-5 Units Subcutaneous QHS  . insulin aspart  0-9 Units Subcutaneous TID WC  . irbesartan  150 mg Oral Daily  . metoprolol succinate  50 mg Oral Daily  . multivitamin with minerals  1 tablet Oral Daily  . pantoprazole (PROTONIX) IV  40 mg Intravenous Q12H  . potassium chloride  40 mEq Oral Once  . sucralfate  1 g Oral TID WC & HS  . thiamine  100 mg Oral Daily   Or  . thiamine  100 mg Intravenous Daily   Continuous Infusions: . diltiazem (CARDIZEM) infusion 15 mg/hr (05/27/19 0800)  . heparin 1,150 Units/hr (05/27/19 0655)  . lactated ringers 125 mL/hr at 05/27/19 0836   PRN Meds: acetaminophen **OR** acetaminophen, LORazepam **OR** LORazepam, promethazine, traZODone   Vital Signs    Vitals:   05/26/19 1955 05/27/19 0412 05/27/19 0818 05/27/19 0833  BP: (!) 164/100 (!) 172/90 (!) 142/84   Pulse: 69 79 88 88  Resp: 16 16    Temp: 97.8 F (36.6 C) 98.1 F (36.7 C)  98.1 F (36.7 C)  TempSrc: Oral Oral  Oral  SpO2: 98% 96%  98%  Weight:  100.6 kg      Intake/Output Summary (Last 24 hours) at 05/27/2019 0930 Last data filed at 05/27/2019 4481 Gross per 24 hour  Intake 4224.91 ml  Output 975 ml  Net 3249.91 ml   Last 3 Weights 05/27/2019 05/26/2019 11/07/2018  Weight (lbs) 221 lb 11.2 oz 219 lb 12.8 oz 220 lb 3.2 oz  Weight (kg) 100.562 kg 99.701 kg 99.882 kg      Telemetry    Atrial fibrillation- Personally Reviewed  ECG    Atrial fibrillation, rate 161- Personally Reviewed  Physical Exam   GEN: No acute distress.   Neck: No JVD Cardiac:  Tachycardic,  irregular, no murmurs, rubs, or gallops.  Respiratory: Clear to auscultation bilaterally. GI: Soft, nontender, non-distended  MS: No edema; No deformity. Neuro:  Nonfocal  Psych: Normal affect   Labs    High Sensitivity Troponin:   Recent Labs  Lab 05/25/19 0015 05/25/19 2029 05/26/19 0727  TROPONINIHS 39* 47* 33*      Chemistry Recent Labs  Lab 05/25/19 2029 05/27/19 0446  NA 131* 135  K 3.5 3.3*  CL 92* 95*  CO2 23 25  GLUCOSE 254* 223*  BUN 37* 15  CREATININE 1.36* 0.98  CALCIUM 9.9 8.6*  PROT 6.6  --   ALBUMIN 3.7  --   AST 29  --   ALT 44  --   ALKPHOS 55  --   BILITOT 2.2*  --   GFRNONAA 58* >60  GFRAA >60 >60  ANIONGAP 16* 15     Hematology Recent Labs  Lab 05/25/19 2029 05/27/19 0446  WBC 8.5 5.3  RBC 5.72 5.04  HGB 18.4* 16.1  HCT 51.1 44.6  MCV 89.3 88.5  MCH 32.2 31.9  MCHC 36.0 36.1*  RDW 11.9 11.9  PLT 176 118*    BNPNo results for input(s): BNP, PROBNP in the last 168 hours.   DDimer  No results for input(s): DDIMER in the last 168 hours.   Radiology    DG Chest Port 1 View  Result Date: 05/25/2019 CLINICAL DATA:  Chest pain for 2 days EXAM: PORTABLE CHEST 1 VIEW COMPARISON:  None. FINDINGS: The heart size and mediastinal contours are within normal limits. Both lungs are clear. The visualized skeletal structures are unremarkable. IMPRESSION: No active disease. Electronically Signed   By: Inez Catalina M.D.   On: 05/25/2019 21:25   ECHOCARDIOGRAM COMPLETE  Result Date: 05/26/2019   ECHOCARDIOGRAM REPORT   Patient Name:   [name] Date of Exam: 05/26/2019 Medical Rec #:  [id number]        Height:       71.0 in Accession #:    [id number]       Weight:       219.8 lb Date of Birth:  Jan 28, 1966        BSA:          2.19 m Patient Age:    2 years         BP:           141/82 mmHg Patient Gender: M                HR:           102 bpm. Exam Location:  Inpatient Procedure: 2D Echo, Color Doppler, Cardiac Doppler and Intracardiac             Opacification Agent Indications:    I48.91* Unspecified atrial fibrillation  History:        Patient has prior history of Echocardiogram examinations, most                 recent 09/26/2018. Arrythmias:Atrial Fibrillation; Risk                 Factors:Hypertension and Diabetes. Persistent Left SVC.  Sonographer:    Raquel Sarna Senior RDCS Referring Phys: [id number] Shela Leff  Sonographer Comments: Technically difficult study due to poor echo windows. IMPRESSIONS  1. Left ventricular ejection fraction, by visual estimation, is 60 to 65%. The left ventricle has normal function. There is moderately increased left ventricular hypertrophy.  2. Left ventricular diastolic function could not be evaluated.  3. The left ventricle has no regional wall motion abnormalities.  4. Global right ventricle was not well visualized.The right ventricular size is not well visualized. Right vetricular wall thickness was not assessed.  5. Left atrial size was normal.  6. Right atrial size was normal.  7. The mitral valve is grossly normal. No evidence of mitral valve regurgitation.  8. The tricuspid valve is grossly normal.  9. The aortic valve was not well visualized. Aortic valve regurgitation is not visualized. 10. The pulmonic valve was grossly normal. Pulmonic valve regurgitation is not visualized. 11. 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 12. The inferior vena cava is normal in size with <50% respiratory variability, suggesting right atrial pressure of 8 mmHg. 13. The interatrial septum was not well visualized. FINDINGS  Left Ventricle: Left ventricular ejection fraction, by visual estimation, is 60 to 65%. The left ventricle has normal function. The left ventricle has no regional wall motion abnormalities. There is moderately increased left ventricular hypertrophy. The  left ventricular diastology could not be evaluated due to atrial fibrillation. Left ventricular diastolic  function could not be evaluated. Right Ventricle: The right ventricular size is not well visualized. Right vetricular wall thickness was not  assessed. Global RV systolic function is was not well visualized. Left Atrium: Left atrial size was normal in size. Right Atrium: Right atrial size was normal in size Pericardium: There is no evidence of pericardial effusion. Mitral Valve: The mitral valve is grossly normal. No evidence of mitral valve regurgitation. Tricuspid Valve: The tricuspid valve is grossly normal. Tricuspid valve regurgitation is trivial. Aortic Valve: The aortic valve was not well visualized. Aortic valve regurgitation is not visualized. Pulmonic Valve: The pulmonic valve was grossly normal. Pulmonic valve regurgitation is not visualized. Pulmonic regurgitation is not visualized. Aorta: The aortic root and ascending aorta are structurally normal, with no evidence of dilitation. Venous: Left partial anomalous pulmonary venous return. The inferior vena cava is normal in size with less than 50% respiratory variability, suggesting right atrial pressure of 8 mmHg. IAS/Shunts: The interatrial septum was not well visualized.  LEFT VENTRICLE PLAX 2D LVIDd:         4.20 cm LVIDs:         2.60 cm LV PW:         1.40 cm LV IVS:        1.20 cm LVOT diam:     1.90 cm LV SV:         54 ml LV SV Index:   23.88 LVOT Area:     2.84 cm  RIGHT VENTRICLE RV S prime:     12.40 cm/s TAPSE (M-mode): 1.1 cm LEFT ATRIUM             Index       RIGHT ATRIUM           Index LA diam:        3.80 cm 1.73 cm/m  RA Area:     14.20 cm LA Vol (A2C):   51.8 ml 23.60 ml/m RA Volume:   35.00 ml  15.95 ml/m LA Vol (A4C):   50.4 ml 22.96 ml/m LA Biplane Vol: 53.6 ml 24.42 ml/m  AORTIC VALVE LVOT Vmax:   102.35 cm/s LVOT Vmean:  74.100 cm/s LVOT VTI:    0.169 m  AORTA Ao Root diam: 3.10 cm  SHUNTS Systemic VTI:  0.17 m Systemic Diam: 1.90 cm  Lyman Bishop MD Electronically signed by Lyman Bishop MD Signature Date/Time:  05/26/2019/2:10:04 PM    Final    US Abdomen Limited RUQ  Result Date: 05/26/2019 CLINICAL DATA:  Increased LFTs EXAM: ULTRASOUND ABDOMEN LIMITED RIGHT UPPER QUADRANT COMPARISON:  None. FINDINGS: Gallbladder: No gallstones or wall thickening visualized. No sonographic Murphy sign noted by sonographer. Common bile duct: Diameter: Normal at 4 mm Liver: Increased liver echogenicity. No duct dilatation. Portal vein is patent on color Doppler imaging with normal direction of blood flow towards the liver. Other: None. IMPRESSION: 1. Normal gallbladder and common bile duct. 2. Increased liver echogenicity commonly represents hepatic steatosis. Electronically Signed   By: Suzy Bouchard M.D.   On: 05/26/2019 06:08    Cardiac Studies   TTE 05/26/19  1. Left ventricular ejection fraction, by visual estimation, is 60 to 65%. The left ventricle has normal function. There is moderately increased left ventricular hypertrophy.  2. Left ventricular diastolic function could not be evaluated.  3. The left ventricle has no regional wall motion abnormalities.  4. Global right ventricle was not well visualized.The right ventricular size is not well visualized. Right vetricular wall thickness was not assessed.  5. Left atrial size was normal.  6. Right atrial size was normal.  7. The mitral valve is  grossly normal. No evidence of mitral valve regurgitation.  8. The tricuspid valve is grossly normal.  9. The aortic valve was not well visualized. Aortic valve regurgitation is not visualized. 10. The pulmonic valve was grossly normal. Pulmonic valve regurgitation is not visualized. 11. 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 12. The inferior vena cava is normal in size with <50% respiratory variability, suggesting right atrial pressure of 8 mmHg. 13. The interatrial septum was not well visualized.  Patient Profile     54 y.o. male with a history of paroxysmal  atrial fibrillation, type 2 diabetes, hypertension, hyperlipidemia, alcohol abuse presented to the hospital with chest pain, found to have rapid atrial fibrillation.  Assessment & Plan    1.  Persistent atrial fibrillation: Patient has been in atrial fibrillation for likely a week.  He drank 2 gallons of bourbon over the last week and this is likely his trigger.  He is well controlled on his diltiazem drip.  He is also currently on heparin.  I Mays Paino transition him to Eliquis and p.o. diltiazem.  He is having issues with insurance, and thus Dillion Stowers have case management to speak with him about making his medications potentially more affordable.  CHA2DS2-VASc of at least 2.  2.  Hypertension: Blood pressure is better controlled on current medications.  Continue Avapro.      For questions or updates, please contact [place] Please consult [email or web address] for contact info under        Signed, Karrah Mangini Meredith Leeds, MD  05/27/2019, 9:30 AM

## 2019-05-27 NOTE — Discharge Instructions (Addendum)
Information on my medicine - ELIQUIS (apixaban)  This medication education was reviewed with me or my healthcare representative as part of my discharge preparation.  The pharmacist that spoke with me during my hospital stay was:  Donnamae Jude, [facility]  Why was Eliquis prescribed for you? Eliquis was prescribed for you to reduce the risk of a blood clot forming that can cause a stroke if you have a medical condition called atrial fibrillation (a type of irregular heartbeat).  What do You need to know about Eliquis ? Take your Eliquis TWICE DAILY - one tablet in the morning and one tablet in the evening with or without food. If you have difficulty swallowing the tablet whole please discuss with your pharmacist how to take the medication safely.  Take Eliquis exactly as prescribed by your doctor and DO NOT stop taking Eliquis without talking to the doctor who prescribed the medication.  Stopping may increase your risk of developing a stroke.  Refill your prescription before you run out.  After discharge, you should have regular check-up appointments with your healthcare provider that is prescribing your Eliquis.  In the future your dose may need to be changed if your kidney function or weight changes by a significant amount or as you get older.  What do you do if you miss a dose? If you miss a dose, take it as soon as you remember on the same day and resume taking twice daily.  Do not take more than one dose of ELIQUIS at the same time to make up a missed dose.  Important Safety Information A possible side effect of Eliquis is bleeding. You should call your healthcare provider right away if you experience any of the following: ? Bleeding from an injury or your nose that does not stop. ? Unusual colored urine (red or dark brown) or unusual colored stools (red or black). ? Unusual bruising for unknown reasons. ? A serious fall or if you hit your head (even if there is no bleeding).  Some  medicines may interact with Eliquis and might increase your risk of bleeding or clotting while on Eliquis. To help avoid this, consult your healthcare provider or pharmacist prior to using any new prescription or non-prescription medications, including herbals, vitamins, non-steroidal anti-inflammatory drugs (NSAIDs) and supplements.  This website has more information on Eliquis (apixaban): http://www.eliquis.com/eliquis/home ------------------------------------------------ Atrial Fibrillation  Atrial fibrillation is a type of heartbeat that is irregular or fast. If you have this condition, your heart beats without any order. This makes it hard for your heart to pump blood in a normal way. Atrial fibrillation may come and go, or it may become a long-lasting problem. If this condition is not treated, it can put you at higher risk for stroke, heart failure, and other heart problems. What are the causes? This condition may be caused by diseases that damage the heart. They include:  High blood pressure.  Heart failure.  Heart valve disease.  Heart surgery. Other causes include:  Diabetes.  Thyroid disease.  Being overweight.  Kidney disease. Sometimes the cause is not known. What increases the risk? You are more likely to develop this condition if:  You are older.  You smoke.  You exercise often and very hard.  You have a family history of this condition.  You are a man.  You use drugs.  You drink a lot of alcohol.  You have lung conditions, such as emphysema, pneumonia, or COPD.  You have sleep apnea. What are  the signs or symptoms? Common symptoms of this condition include:  A feeling that your heart is beating very fast.  Chest pain or discomfort.  Feeling short of breath.  Suddenly feeling light-headed or weak.  Getting tired easily during activity.  Fainting.  Sweating. In some cases, there are no symptoms. How is this treated? Treatment for this  condition depends on underlying conditions and how you feel when you have atrial fibrillation. They include:  Medicines to: ? Prevent blood clots. ? Treat heart rate or heart rhythm problems.  Using devices, such as a pacemaker, to correct heart rhythm problems.  Doing surgery to remove the part of the heart that sends bad signals.  Closing an area where clots can form in the heart (left atrial appendage). In some cases, your doctor will treat other underlying conditions. Follow these instructions at home: Medicines  Take over-the-counter and prescription medicines only as told by your doctor.  Do not take any new medicines without first talking to your doctor.  If you are taking blood thinners: ? Talk with your doctor before you take any medicines that have aspirin or NSAIDs, such as ibuprofen, in them. ? Take your medicine exactly as told by your doctor. Take it at the same time each day. ? Avoid activities that could hurt or bruise you. Follow instructions about how to prevent falls. ? Wear a bracelet that says you are taking blood thinners. Or, carry a card that lists what medicines you take. Lifestyle      Do not use any products that have nicotine or tobacco in them. These include cigarettes, e-cigarettes, and chewing tobacco. If you need help quitting, ask your doctor.  Eat heart-healthy foods. Talk with your doctor about the right eating plan for you.  Exercise regularly as told by your doctor.  Do not drink alcohol.  Lose weight if you are overweight.  Do not use drugs, including cannabis. General instructions  If you have a condition that causes breathing to stop for a short period of time (apnea), treat it as told by your doctor.  Keep a healthy weight. Do not use diet pills unless your doctor says they are safe for you. Diet pills may make heart problems worse.  Keep all follow-up visits as told by your doctor. This is important. Contact a doctor if:  You  notice a change in the speed, rhythm, or strength of your heartbeat.  You are taking a blood-thinning medicine and you get more bruising.  You get tired more easily when you move or exercise.  You have a sudden change in weight. Get help right away if:   You have pain in your chest or your belly (abdomen).  You have trouble breathing.  You have side effects of blood thinners, such as blood in your vomit, poop (stool), or pee (urine), or bleeding that cannot stop.  You have any signs of a stroke. "BE FAST" is an easy way to remember the main warning signs: ? B - Balance. Signs are dizziness, sudden trouble walking, or loss of balance. ? E - Eyes. Signs are trouble seeing or a change in how you see. ? F - Face. Signs are sudden weakness or loss of feeling in the face, or the face or eyelid drooping on one side. ? A - Arms. Signs are weakness or loss of feeling in an arm. This happens suddenly and usually on one side of the body. ? S - Speech. Signs are sudden trouble speaking, slurred  speech, or trouble understanding what people say. ? T - Time. Time to call emergency services. Write down what time symptoms started.  You have other signs of a stroke, such as: ? A sudden, very bad headache with no known cause. ? Feeling like you may vomit (nausea). ? Vomiting. ? A seizure. These symptoms may be an emergency. Do not wait to see if the symptoms will go away. Get medical help right away. Call your local emergency services (911 in the [place] Do not drive yourself to the hospital. Summary  Atrial fibrillation is a type of heartbeat that is irregular or fast.  You are at higher risk of this condition if you smoke, are older, have diabetes, or are overweight.  Follow your doctor's instructions about medicines, diet, exercise, and follow-up visits.  Get help right away if you have signs or symptoms of a stroke.  Get help right away if you cannot catch your breath, or you have chest pain  or discomfort. This information is not intended to replace advice given to you by your health care provider. Make sure you discuss any questions you have with your health care provider. Document Revised: 10/18/2018 Document Reviewed: 10/18/2018 Elsevier Patient Education  2020 [employer].  Intensive Outpatient Programs  [place] [address]     [place] [address] [place],  [place], [place]      [place]  (Inpatient and outpatient)  [phone] (Suboxone and Methadone) [address]           [phone]           [facility]: [facility] Drug Services    [facility] - Intensive Outpatient [address]     [address] [place] [address], [place] [address]     [place], [place]      [phone]  [facility] (Outpatient, Inpatient, [facility]  [facility] (Groups and [facility]) (insurance only) [phone]    [place], [place]       [facility]    [facility] (for caregivers and family) [address]     [address] [place], [place], [place]      [phone]
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
## 2019-05-27 NOTE — Progress Notes (Signed)
PROGRESS NOTE  Kyle Dawson:[id number] DOB: [birth year] DOA: 05/25/2019 PCP: Elby Showers, MD  HPI/Recap of past 24 hours: Per admission HPI: Kyle Dawson is a 54 y.o. male with medical history significant of A. fib, type 2 diabetes, hypertension, hyperlipidemia, alcohol use disorder, history of squamous cell carcinoma presenting to the ED via EMS with a chief complaint of chest pain.  Patient states he is an "alcoholic" and has been drinking since 2016.  On 1/4 he started binge drinking and drank 2 gallons of bourbon in about a week.  After that week he started dry heaving and was not able to tolerate any p.o. intake.  He was having pain in his esophagus when he tried to swallow anything.  Also having epigastric abdominal pain.  No hematemesis, hematochezia, or melena.  Symptoms improved after he took probiotics and some over-the-counter medications and he was able to tolerate drinking some smoothies.  For the past 3 days he has noticed that he is having left-sided aching chest pain which is present even at rest.  No heart palpitations.  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  Since then he has not been able to take any of his medications.  He also has anxiety and depression but has not been able to see a psychiatrist and is not on any medications.  Currently visiting a counselor.  Denies suicidal ideation.  States he has not consumed any alcohol since 1/11.  Subjective: Patient seen and examined at bedside he is complaining of epigastric pain and that the Protonix is not helping.  Also he is concerned that his heart rate goes up fine he gets off May 27, 2019 Subjective: Patient seen and examined at bedside he feels much better today he has been seen by cardiology he is going to be started on oral Cardizem and he will taper off his IV Cardizem.  Assessment/Plan: Principal Problem:   Atrial fibrillation with rapid ventricular response (HCC) Active Problems:   Alcohol  use   Abdominal pain   Odynophagia   Demand ischemia (HCC)   Chronic post-traumatic stress disorder (PTSD)  #1 atrial fibrillation with rapid ventricular rate currently on Cardizem drip Patient admitted with proximal atrial fibrillation currently on Cardizem drip still has tachycardia with mild exertion like getting up out of bed.  Also blood pressure is uncontrolled.  I have consulted with cardiology.  They recommend leaving him on Cardizem drip at 50 mics per minute restarting his home metoprolol 50 mg once daily also he was on Avapro 300 mg they recommend starting it at 150 they will continue to follow  2.  GERD patient was started on PPI he is still having symptoms so I have added sucralfate   3.  Uncontrolled hypertension restarting his home medications include Avapro and metoprolol he will continue his Cardizem drip for right now  4.  History of alcohol abuse continue CIWA monitoring  5.  Depression continue fluoxetine  Code Status: Full  Severity of Illness: The appropriate patient status for this patient is INPATIENT. Inpatient status is judged to be reasonable and necessary in order to provide the required intensity of service to ensure the patient's safety. The patient's presenting symptoms, physical exam findings, and initial radiographic and laboratory data in the context of their chronic comorbidities is felt to place them at high risk for further clinical deterioration. Furthermore, it is not anticipated that the patient will be medically stable for discharge from the hospital  within 2 midnights of admission. The following factors support the patient status of inpatient.   " Patient admitted with proximal atrial fibrillation currently on Cardizem drip still has tachycardia with mild exertion like getting up out of bed.  Also blood pressure is uncontrolled.  I have consulted with cardiology * I certify that at the point of admission it is my clinical judgment that the patient  will require inpatient hospital care spanning beyond 2 midnights from the point of admission due to high intensity of service, high risk for further deterioration and high frequency of surveillance required.*    Family Communication: Discussed with patient  Disposition Plan: Home when stable   Consultants:  Cardiology  Procedures:  None  Antimicrobials:  None  DVT prophylaxis: Heparin   Objective: Vitals:   05/27/19 1208 05/27/19 1304 05/27/19 1432 05/27/19 1600  BP:  (!) 154/92  (!) 138/98  Pulse: 74 77  73  Resp:    16  Temp: 98.1 F (36.7 C)   98.1 F (36.7 C)  TempSrc: Oral   Oral  SpO2: 100% 100%  97%  Weight:      Height:   5' 11"  (1.803 m)     Intake/Output Summary (Last 24 hours) at 05/27/2019 1915 Last data filed at 05/27/2019 1849 Gross per 24 hour  Intake 5027.61 ml  Output 1750 ml  Net 3277.61 ml   Filed Weights   05/26/19 0348 05/27/19 0412  Weight: 99.7 kg 100.6 kg   Body mass index is 30.92 kg/m.  Exam:  . General: 54 y.o. year-old male well developed well nourished in no acute distress.  Alert and oriented x3. . Cardiovascular: Regular rate and rhythm with no rubs or gallops.  No thyromegaly or JVD noted.   Marland Kitchen Respiratory: Clear to auscultation with no wheezes or rales. Good inspiratory effort. . Abdomen: Soft epigastric tenderness nondistended with normal bowel sounds x4 quadrants. . Musculoskeletal: No lower extremity edema. 2/4 pulses in all 4 extremities. . Skin: No ulcerative lesions noted or rashes, . Psychiatry: Mood is appropriate for condition and setting    Data Reviewed: CBC: Recent Labs  Lab 05/25/19 2029 05/27/19 0446  WBC 8.5 5.3  NEUTROABS 5.9  --   HGB 18.4* 16.1  HCT 51.1 44.6  MCV 89.3 88.5  PLT 176 888*   Basic Metabolic Panel: Recent Labs  Lab 05/25/19 2029 05/27/19 0446  NA 131* 135  K 3.5 3.3*  CL 92* 95*  CO2 23 25  GLUCOSE 254* 223*  BUN 37* 15  CREATININE 1.36* 0.98  CALCIUM 9.9 8.6*  MG  1.8 1.8   GFR: Estimated Creatinine Clearance: 102.9 mL/min (by C-G formula based on SCr of 0.98 mg/dL). Liver Function Tests: Recent Labs  Lab 05/25/19 2029  AST 29  ALT 44  ALKPHOS 55  BILITOT 2.2*  PROT 6.6  ALBUMIN 3.7   Recent Labs  Lab 05/25/19 2050  LIPASE 43   No results for input(s): AMMONIA in the last 168 hours. Coagulation Profile: No results for input(s): INR, PROTIME in the last 168 hours. Cardiac Enzymes: No results for input(s): CKTOTAL, CKMB, CKMBINDEX, TROPONINI in the last 168 hours. BNP (last 3 results) No results for input(s): PROBNP in the last 8760 hours. HbA1C: Recent Labs    05/26/19 0434  HGBA1C 9.2*   CBG: Recent Labs  Lab 05/26/19 1503 05/26/19 2116 05/27/19 0736 05/27/19 1132 05/27/19 1632  GLUCAP 123* 205* 225* 191* 201*   Lipid Profile: Recent Labs    05/26/19  0434  CHOL 213*  HDL 79  LDLCALC 106*  TRIG 142  CHOLHDL 2.7   Thyroid Function Tests: No results for input(s): TSH, T4TOTAL, FREET4, T3FREE, THYROIDAB in the last 72 hours. Anemia Panel: No results for input(s): VITAMINB12, FOLATE, FERRITIN, TIBC, IRON, RETICCTPCT in the last 72 hours. Urine analysis:    Component Value Date/Time   COLORURINE YELLOW 11/27/2017 1330   APPEARANCEUR CLEAR 11/27/2017 1330   LABSPEC 1.023 11/27/2017 1330   PHURINE 7.0 11/27/2017 1330   GLUCOSEU >=500 (A) 11/27/2017 1330   HGBUR NEGATIVE 11/27/2017 1330   BILIRUBINUR NEGATIVE 11/27/2017 1330   BILIRUBINUR negative 04/28/2016 1153   KETONESUR 5 (A) 11/27/2017 1330   PROTEINUR NEGATIVE 11/27/2017 1330   UROBILINOGEN negative 04/28/2016 1153   NITRITE NEGATIVE 11/27/2017 1330   LEUKOCYTESUR NEGATIVE 11/27/2017 1330   Sepsis Labs: @LABRCNTIP (procalcitonin:4,lacticidven:4)  ) Recent Results (from the past 240 hour(s))  SARS CORONAVIRUS 2 (TAT 6-24 HRS) Nasopharyngeal Nasopharyngeal Swab     Status: None   Collection Time: 05/25/19  8:50 PM   Specimen: Nasopharyngeal Swab   Result Value Ref Range Status   SARS Coronavirus 2 NEGATIVE NEGATIVE Final    Comment: (NOTE) SARS-CoV-2 target nucleic acids are NOT DETECTED. The SARS-CoV-2 RNA is generally detectable in upper and lower respiratory specimens during the acute phase of infection. Negative results do not preclude SARS-CoV-2 infection, do not rule out co-infections with other pathogens, and should not be used as the sole basis for treatment or other patient management decisions. Negative results must be combined with clinical observations, patient history, and epidemiological information. The expected result is Negative. Fact Sheet for Patients: SugarRoll.be Fact Sheet for Healthcare Providers: https://www.woods-mathews.com/ This test is not yet approved or cleared by the [place] FDA and  has been authorized for detection and/or diagnosis of SARS-CoV-2 by FDA under an Emergency Use Authorization (EUA). This EUA will remain  in effect (meaning this test can be used) for the duration of the COVID-19 declaration under Section 56 4(b)(1) of the Act, 21 U.S.C. section 360bbb-3(b)(1), unless the authorization is terminated or revoked sooner. Performed at [facility], [place] [address], [place], [place] [address]       Studies: No results found.  Scheduled Meds: . apixaban  5 mg Oral BID  . diltiazem  240 mg Oral Daily  . FLUoxetine  20 mg Oral Daily  . folic acid  1 mg Oral Daily  . influenza vac split quadrivalent PF  0.5 mL Intramuscular Tomorrow-1000  . insulin aspart  0-5 Units Subcutaneous QHS  . insulin aspart  0-9 Units Subcutaneous TID WC  . irbesartan  150 mg Oral Daily  . metoprolol succinate  50 mg Oral Daily  . multivitamin with minerals  1 tablet Oral Daily  . pantoprazole (PROTONIX) IV  40 mg Intravenous Q12H  . sucralfate  1 g Oral TID WC & HS  . thiamine  100 mg Oral Daily   Or  . thiamine  100 mg Intravenous Daily     Continuous Infusions: . diltiazem (CARDIZEM) infusion Stopped (05/27/19 1303)     LOS: 2 days     Cristal Deer, MD [facility]  To reach me or the doctor on call, go to: [email or web address] Password [email or web address]  05/27/2019, 7:15 PM

## 2019-05-27 NOTE — Progress Notes (Signed)
[facility] for Heparin  Indication: atrial fibrillation  Allergies  Allergen Reactions  . Oxycodone Anxiety and Other (See Comments)    "I drank it with beer and it made me hyper"    Patient Measurements: Weight: 221 lb 11.2 oz (100.6 kg)  Vital Signs: Temp: 98.1 F (36.7 C) (01/17 0412) Temp Source: Oral (01/17 0412) BP: 172/90 (01/17 0412) Pulse Rate: 79 (01/17 0412)  Labs: Recent Labs    05/25/19 0015 05/25/19 2029 05/26/19 0727 05/26/19 1456 05/27/19 0446  HGB  --  18.4*  --   --   --   HCT  --  51.1  --   --   --   PLT  --  176  --   --   --   HEPARINUNFRC  --   --   --  0.35 1.02*  CREATININE  --  1.36*  --   --   --   TROPONINIHS 39* 47* 33*  --   --     CrCl cannot be calculated (Unknown ideal weight.).   Medical History: Past Medical History:  Diagnosis Date  . Basal cell carcinoma   . Chest pain   . Diabetes mellitus without complication ([place])   . Edema    lower extremity  . Hypertension   . Squamous cell carcinoma    R ear, nose, each side of face    Assessment: 54 y/o M with afib to start heparin, previously on Xarelto at home but hasn't taken in about 6 months, had been taking high dose aspirin instead. CBC good. Scr 1.36.   1/17 AM update:  Heparin level elevated Drawn from opposite arm of heparin infusion per RN  Goal of Therapy:  Heparin level 0.3-0.7 units/ml Monitor platelets by anticoagulation protocol: Yes   Plan:  Hold heparin x 1 hr Re-start heparin drip at 1150 units/hr at 0630 1400 heparin level  Daily CBC/HL Monitor for bleeding  Narda Bonds, PharmD, BCPS Clinical Pharmacist Phone: [phone]

## 2019-05-28 DIAGNOSIS — Z9114 Patient's other noncompliance with medication regimen: Secondary | ICD-10-CM

## 2019-05-28 DIAGNOSIS — Z7289 Other problems related to lifestyle: Secondary | ICD-10-CM

## 2019-05-28 DIAGNOSIS — F4312 Post-traumatic stress disorder, chronic: Secondary | ICD-10-CM

## 2019-05-28 DIAGNOSIS — R1310 Dysphagia, unspecified: Secondary | ICD-10-CM

## 2019-05-28 DIAGNOSIS — I248 Other forms of acute ischemic heart disease: Secondary | ICD-10-CM

## 2019-05-28 DIAGNOSIS — Z7901 Long term (current) use of anticoagulants: Secondary | ICD-10-CM

## 2019-05-28 DIAGNOSIS — I10 Essential (primary) hypertension: Secondary | ICD-10-CM

## 2019-05-28 DIAGNOSIS — E1169 Type 2 diabetes mellitus with other specified complication: Secondary | ICD-10-CM

## 2019-05-28 DIAGNOSIS — E785 Hyperlipidemia, unspecified: Secondary | ICD-10-CM

## 2019-05-28 LAB — CBC
HCT: 42.3 % (ref 39.0–52.0)
Hemoglobin: 15.1 g/dL (ref 13.0–17.0)
MCH: 32.3 pg (ref 26.0–34.0)
MCHC: 35.7 g/dL (ref 30.0–36.0)
MCV: 90.4 fL (ref 80.0–100.0)
Platelets: 124 10*3/uL — ABNORMAL LOW (ref 150–400)
RBC: 4.68 MIL/uL (ref 4.22–5.81)
RDW: 12 % (ref 11.5–15.5)
WBC: 4.9 10*3/uL (ref 4.0–10.5)
nRBC: 0 % (ref 0.0–0.2)

## 2019-05-28 LAB — BASIC METABOLIC PANEL
Anion gap: 14 (ref 5–15)
BUN: 13 mg/dL (ref 6–20)
CO2: 22 mmol/L (ref 22–32)
Calcium: 9 mg/dL (ref 8.9–10.3)
Chloride: 96 mmol/L — ABNORMAL LOW (ref 98–111)
Creatinine, Ser: 1.06 mg/dL (ref 0.61–1.24)
GFR calc Af Amer: >60 mL/min (ref >60)
GFR calc non Af Amer: >60 mL/min (ref >60)
Glucose, Bld: 242 mg/dL — ABNORMAL HIGH (ref 70–99)
Potassium: 3.7 mmol/L (ref 3.5–5.1)
Sodium: 132 mmol/L — ABNORMAL LOW (ref 135–145)

## 2019-05-28 LAB — GLUCOSE, CAPILLARY
Glucose-Capillary: 172 mg/dL — ABNORMAL HIGH (ref 70–99)
Glucose-Capillary: 230 mg/dL — ABNORMAL HIGH (ref 70–99)
Glucose-Capillary: 215 mg/dL — ABNORMAL HIGH (ref 70–99)

## 2019-05-28 MED ORDER — HYDRALAZINE HCL 25 MG PO TABS: 25.0000 mg | ORAL_TABLET | Freq: Four times a day (QID) | ORAL | 11 refills | Status: DC

## 2019-05-28 MED ORDER — PANTOPRAZOLE SODIUM 40 MG PO TBEC: 40.0000 mg | DELAYED_RELEASE_TABLET | Freq: Two times a day (BID) | ORAL | 1 refills | Status: DC

## 2019-05-28 MED ORDER — CALCIUM CARBONATE ANTACID 500 MG PO CHEW: 1.0000 | CHEWABLE_TABLET | Freq: Three times a day (TID) | ORAL | 0 refills | Status: DC | PRN

## 2019-05-28 MED ORDER — METOPROLOL SUCCINATE ER 50 MG PO TB24
50.0000 mg | ORAL_TABLET | ORAL | Status: AC
  Administered 2019-05-28: 50 mg via ORAL
  Filled 2019-05-28: qty 1

## 2019-05-28 MED ORDER — TRAZODONE HCL 100 MG PO TABS: 100.0000 mg | ORAL_TABLET | Freq: Every evening | ORAL | 0 refills | Status: DC | PRN

## 2019-05-28 MED ORDER — IRBESARTAN 150 MG PO TABS: 150.0000 mg | ORAL_TABLET | Freq: Every day | ORAL | 1 refills | Status: DC

## 2019-05-28 MED ORDER — METOPROLOL SUCCINATE ER 100 MG PO TB24: 100.0000 mg | ORAL_TABLET | Freq: Every day | ORAL | Status: DC

## 2019-05-28 MED ORDER — IRBESARTAN 150 MG PO TABS: 300.0000 mg | ORAL_TABLET | Freq: Every day | ORAL | Status: DC

## 2019-05-28 MED ORDER — ATORVASTATIN CALCIUM 10 MG PO TABS: 10.0000 mg | ORAL_TABLET | Freq: Every day | ORAL | 2 refills | Status: DC

## 2019-05-28 MED ORDER — SITAGLIPTIN PHOSPHATE 100 MG PO TABS: 100.0000 mg | ORAL_TABLET | Freq: Every day | ORAL | 0 refills | Status: DC

## 2019-05-28 MED ORDER — ATORVASTATIN CALCIUM 40 MG PO TABS
40.0000 mg | ORAL_TABLET | Freq: Every day | ORAL | Status: DC
  Administered 2019-05-28: 40 mg via ORAL
  Filled 2019-05-28: qty 1

## 2019-05-28 MED ORDER — HYDRALAZINE HCL 25 MG PO TABS: 25.0000 mg | ORAL_TABLET | Freq: Three times a day (TID) | ORAL | 11 refills | Status: DC | PRN

## 2019-05-28 MED ORDER — DILTIAZEM HCL ER COATED BEADS 240 MG PO CP24: 240.0000 mg | ORAL_CAPSULE | Freq: Every day | ORAL | 2 refills | Status: DC

## 2019-05-28 MED ORDER — THIAMINE HCL 100 MG PO TABS: 100.0000 mg | ORAL_TABLET | Freq: Every day | ORAL | 0 refills | Status: DC

## 2019-05-28 MED ORDER — IRBESARTAN 150 MG PO TABS
150.0000 mg | ORAL_TABLET | Freq: Once | ORAL | Status: AC
  Administered 2019-05-28: 150 mg via ORAL
  Filled 2019-05-28: qty 1

## 2019-05-28 MED ORDER — IRBESARTAN 300 MG PO TABS: 300.0000 mg | ORAL_TABLET | Freq: Every day | ORAL | 2 refills | Status: DC

## 2019-05-28 MED ORDER — EMPAGLIFLOZIN 25 MG PO TABS: 25.0000 mg | ORAL_TABLET | Freq: Every day | ORAL | 1 refills | Status: AC

## 2019-05-28 MED ORDER — METOPROLOL SUCCINATE ER 100 MG PO TB24: 100.0000 mg | ORAL_TABLET | Freq: Every day | ORAL | 2 refills | Status: DC

## 2019-05-28 MED ORDER — SUCRALFATE 1 GM/10ML PO SUSP: 1.0000 g | Freq: Three times a day (TID) | ORAL | 0 refills | Status: DC

## 2019-05-28 MED ORDER — FLUOXETINE HCL 20 MG PO CAPS: 20.0000 mg | ORAL_CAPSULE | Freq: Every day | ORAL | 0 refills | Status: DC

## 2019-05-28 MED ORDER — APIXABAN 5 MG PO TABS: 5.0000 mg | ORAL_TABLET | Freq: Two times a day (BID) | ORAL | 3 refills | Status: DC

## 2019-05-28 MED ORDER — GLIPIZIDE 5 MG PO TABS: 2.5000 mg | ORAL_TABLET | Freq: Two times a day (BID) | ORAL | 11 refills | Status: DC

## 2019-05-28 MED FILL — FLUoxetine HCL 20 MG CAPS: 20 | 30 days supply | Qty: 30 | Fill #0

## 2019-05-28 MED FILL — hydrALAZINE HCL 25 MG TABS: 25 | 30 days supply | Qty: 90 | Fill #0

## 2019-05-28 MED FILL — SUCRALFATE 1 GM/10ML SUSP: 1 | 11 days supply | Qty: 420 | Fill #0

## 2019-05-28 MED FILL — PANTOPRAZOLE SOD DR 40 MG T: 40 | 30 days supply | Qty: 60 | Fill #0

## 2019-05-28 MED FILL — ATORVASTATIN CALCIUM 10 MG: 10 | 30 days supply | Qty: 30 | Fill #0

## 2019-05-28 MED FILL — VITAMIN B-1 100 MG TABS: 100 | 30 days supply | Qty: 30 | Fill #0

## 2019-05-28 MED FILL — IRBESARTAN 300 MG TABLET: 300 | 30 days supply | Qty: 30 | Fill #0

## 2019-05-28 MED FILL — traZODone HCL 100 MG TABS: 100 | 30 days supply | Qty: 30 | Fill #0

## 2019-05-28 MED FILL — glipiZIDE 5 MG TABS: 5 | 30 days supply | Qty: 30 | Fill #0

## 2019-05-28 MED FILL — CARTIA XT 240 MG CAPSULE: 240 | 30 days supply | Qty: 30 | Fill #0

## 2019-05-28 MED FILL — METOPROLOL SUCCINATE ER 100: 100 | 30 days supply | Qty: 30 | Fill #0

## 2019-05-28 MED FILL — ELIQUIS 5 MG TABLET: 5 | 30 days supply | Qty: 60 | Fill #0

## 2019-05-28 MED FILL — CALCIUM ANTACID 500 MG CHW: 500 | 30 days supply | Qty: 150 | Fill #0

## 2019-05-28 NOTE — Progress Notes (Addendum)
The patient has been seen in conjunction with Daune Perch, NP. All aspects of care have been considered and discussed. The patient has been personally interviewed, examined, and all clinical data has been reviewed.   Agree with content of this note.  Once reasonable rate control achieved, will be able to discharge patient, follow-up with Dr. Harrington Challenger, and hopefully have outpatient electrical cardioversion in approximately 1 month.  With financial difficulties and alcohol use, management is complicated relative to compliance/affordability and risk of bleeding.   Progress Note  Patient Name: Kyle Dawson Date of Encounter: 05/28/2019  Primary Cardiologist: Dorris Carnes, MD   Subjective   Pt has had a few intermittent palpitations over night as well as a couple of episodes of dull left chest pain "under the left breast", resolves with relaxation. No shortness of breath.   Inpatient Medications    Scheduled Meds: . apixaban  5 mg Oral BID  . diltiazem  240 mg Oral Daily  . FLUoxetine  20 mg Oral Daily  . folic acid  1 mg Oral Daily  . insulin aspart  0-5 Units Subcutaneous QHS  . insulin aspart  0-9 Units Subcutaneous TID WC  . irbesartan  150 mg Oral Daily  . metoprolol succinate  50 mg Oral Daily  . multivitamin with minerals  1 tablet Oral Daily  . pantoprazole (PROTONIX) IV  40 mg Intravenous Q12H  . sucralfate  1 g Oral TID WC & HS  . thiamine  100 mg Oral Daily   Or  . thiamine  100 mg Intravenous Daily   Continuous Infusions: . diltiazem (CARDIZEM) infusion Stopped (05/27/19 1303)   PRN Meds: acetaminophen **OR** acetaminophen, calcium carbonate, LORazepam **OR** LORazepam, promethazine, traZODone   Vital Signs    Vitals:   05/27/19 2100 05/28/19 0530 05/28/19 0852 05/28/19 0853  BP: (!) 160/97 (!) 170/110 (!) 174/114   Pulse: 87 61  (!) 115  Resp:      Temp: 98 F (36.7 C) 98.6 F (37 C)    TempSrc: Oral Oral    SpO2: 98% 96%    Weight:  100 kg      Height:        Intake/Output Summary (Last 24 hours) at 05/28/2019 0854 Last data filed at 05/28/2019 0530 Gross per 24 hour  Intake 922.7 ml  Output 2095 ml  Net -1172.3 ml   Last 3 Weights 05/28/2019 05/27/2019 05/26/2019  Weight (lbs) 220 lb 8 oz 221 lb 11.2 oz 219 lb 12.8 oz  Weight (kg) 100.018 kg 100.562 kg 99.701 kg      Telemetry    A. fib 70s-90s overnight, 100s to 110s this morning- Personally Reviewed  ECG    No new tracings for review  Physical Exam   GEN: No acute distress.   Neck: No JVD Cardiac: Irregularly irregular rhythm, no murmurs, rubs, or gallops.  Respiratory: Clear to auscultation bilaterally. GI: Soft, nontender, non-distended  MS: No edema; No deformity. Neuro:  Nonfocal  Psych: Normal affect   Labs    High Sensitivity Troponin:   Recent Labs  Lab 05/25/19 0015 05/25/19 2029 05/26/19 0727  TROPONINIHS 39* 47* 33*      Chemistry Recent Labs  Lab 05/25/19 2029 05/27/19 0446  NA 131* 135  K 3.5 3.3*  CL 92* 95*  CO2 23 25  GLUCOSE 254* 223*  BUN 37* 15  CREATININE 1.36* 0.98  CALCIUM 9.9 8.6*  PROT 6.6  --   ALBUMIN 3.7  --  AST 29  --   ALT 44  --   ALKPHOS 55  --   BILITOT 2.2*  --   GFRNONAA 58* >60  GFRAA >60 >60  ANIONGAP 16* 15     Hematology Recent Labs  Lab 05/25/19 2029 05/27/19 0446 05/28/19 0415  WBC 8.5 5.3 4.9  RBC 5.72 5.04 4.68  HGB 18.4* 16.1 15.1  HCT 51.1 44.6 42.3  MCV 89.3 88.5 90.4  MCH 32.2 31.9 32.3  MCHC 36.0 36.1* 35.7  RDW 11.9 11.9 12.0  PLT 176 118* 124*    BNPNo results for input(s): BNP, PROBNP in the last 168 hours.   DDimer No results for input(s): DDIMER in the last 168 hours.   Radiology    ECHOCARDIOGRAM COMPLETE  Result Date: 05/26/2019   ECHOCARDIOGRAM REPORT   Patient Name:   Kyle Dawson Date of Exam: 05/26/2019 Medical Rec #:  [id number]        Height:       71.0 in Accession #:    [id number]       Weight:       219.8 lb Date of Birth:  Sep 09, 1965        BSA:           2.19 m Patient Age:    54 years         BP:           141/82 mmHg Patient Gender: M                HR:           102 bpm. Exam Location:  Inpatient Procedure: 2D Echo, Color Doppler, Cardiac Doppler and Intracardiac            Opacification Agent Indications:    I48.91* Unspecified atrial fibrillation  History:        Patient has prior history of Echocardiogram examinations, most                 recent 09/26/2018. Arrythmias:Atrial Fibrillation; Risk                 Factors:Hypertension and Diabetes. Persistent Left SVC.  Sonographer:    Raquel Sarna Senior RDCS Referring Phys: [id number] Shela Leff  Sonographer Comments: Technically difficult study due to poor echo windows. IMPRESSIONS  1. Left ventricular ejection fraction, by visual estimation, is 60 to 65%. The left ventricle has normal function. There is moderately increased left ventricular hypertrophy.  2. Left ventricular diastolic function could not be evaluated.  3. The left ventricle has no regional wall motion abnormalities.  4. Global right ventricle was not well visualized.The right ventricular size is not well visualized. Right vetricular wall thickness was not assessed.  5. Left atrial size was normal.  6. Right atrial size was normal.  7. The mitral valve is grossly normal. No evidence of mitral valve regurgitation.  8. The tricuspid valve is grossly normal.  9. The aortic valve was not well visualized. Aortic valve regurgitation is not visualized. 10. The pulmonic valve was grossly normal. Pulmonic valve regurgitation is not visualized. 11. 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 12. The inferior vena cava is normal in size with <50% respiratory variability, suggesting right atrial pressure of 8 mmHg. 13. The interatrial septum was not well visualized. FINDINGS  Left Ventricle: Left ventricular ejection fraction, by visual estimation, is 60 to 65%. The left ventricle has normal function. The  left ventricle has no regional wall  motion abnormalities. There is moderately increased left ventricular hypertrophy. The  left ventricular diastology could not be evaluated due to atrial fibrillation. Left ventricular diastolic function could not be evaluated. Right Ventricle: The right ventricular size is not well visualized. Right vetricular wall thickness was not assessed. Global RV systolic function is was not well visualized. Left Atrium: Left atrial size was normal in size. Right Atrium: Right atrial size was normal in size Pericardium: There is no evidence of pericardial effusion. Mitral Valve: The mitral valve is grossly normal. No evidence of mitral valve regurgitation. Tricuspid Valve: The tricuspid valve is grossly normal. Tricuspid valve regurgitation is trivial. Aortic Valve: The aortic valve was not well visualized. Aortic valve regurgitation is not visualized. Pulmonic Valve: The pulmonic valve was grossly normal. Pulmonic valve regurgitation is not visualized. Pulmonic regurgitation is not visualized. Aorta: The aortic root and ascending aorta are structurally normal, with no evidence of dilitation. Venous: Left partial anomalous pulmonary venous return. The inferior vena cava is normal in size with less than 50% respiratory variability, suggesting right atrial pressure of 8 mmHg. IAS/Shunts: The interatrial septum was not well visualized.  LEFT VENTRICLE PLAX 2D LVIDd:         4.20 cm LVIDs:         2.60 cm LV PW:         1.40 cm LV IVS:        1.20 cm LVOT diam:     1.90 cm LV SV:         54 ml LV SV Index:   23.88 LVOT Area:     2.84 cm  RIGHT VENTRICLE RV S prime:     12.40 cm/s TAPSE (M-mode): 1.1 cm LEFT ATRIUM             Index       RIGHT ATRIUM           Index LA diam:        3.80 cm 1.73 cm/m  RA Area:     14.20 cm LA Vol (A2C):   51.8 ml 23.60 ml/m RA Volume:   35.00 ml  15.95 ml/m LA Vol (A4C):   50.4 ml 22.96 ml/m LA Biplane Vol: 53.6 ml 24.42 ml/m  AORTIC VALVE LVOT Vmax:    102.35 cm/s LVOT Vmean:  74.100 cm/s LVOT VTI:    0.169 m  AORTA Ao Root diam: 3.10 cm  SHUNTS Systemic VTI:  0.17 m Systemic Diam: 1.90 cm  Lyman Bishop MD Electronically signed by Lyman Bishop MD Signature Date/Time: 05/26/2019/2:10:04 PM    Final     Cardiac Studies   TTE 05/26/19 1. Left ventricular ejection fraction, by visual estimation, is 60 to 65%. The left ventricle has normal function. There is moderately increased left ventricular hypertrophy. 2. Left ventricular diastolic function could not be evaluated. 3. The left ventricle has no regional wall motion abnormalities. 4. Global right ventricle was not well visualized.The right ventricular size is not well visualized. Right vetricular wall thickness was not assessed. 5. Left atrial size was normal. 6. Right atrial size was normal. 7. The mitral valve is grossly normal. No evidence of mitral valve regurgitation. 8. The tricuspid valve is grossly normal. 9. The aortic valve was not well visualized. Aortic valve regurgitation is not visualized. 10. The pulmonic valve was grossly normal. Pulmonic valve regurgitation is not visualized. 11. 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 12. The inferior vena cava is normal in size with <  50% respiratory variability, suggesting right atrial pressure of 8 mmHg. 13. The interatrial septum was not well visualized.  Patient Profile     54 y.o. male with a history of paroxysmal atrial fibrillation, type 2 diabetes, hypertension, hyperlipidemia, alcohol abuse presented to the hospital with chest pain, found to have rapid atrial fibrillation.  Assessment & Plan    Persistent atrial fibrillation -Patient found to be in atrial fibrillation for likely the prior week.  Reportedly dranks 2 gallons of bourbon over the prior week and this is felt to be a likely trigger. -Diltiazem has been switched to oral at 240 mg daily.  He is continued on  Toprol-XL 50 mg daily. -Currently continues in atrial fibrillation with suboptimally controlled rates.  Blood pressure is also elevated leaving room for additional rate lowering medications.  We will increase his Toprol XL from 50 mg to 100 mg this morning.  -CHA2DS2-VASc score of at least 2 (HTN, DM).  He was initially anticoagulated with heparin but has now been transitioned to Eliquis for stroke risk reduction. -Will target rate control for now.  -Could consider outpatient DCCV after 4 weeks of uninterrupted anticoagulation. Pt is currently having issues with esophageal irritation and not likely good to do TEE at this time.   Hypertension -Continues on irbesartan 150 mg daily with addition of diltiazem 240 mg and Toprol-XL 50 mg. -Blood pressure is significantly elevated this morning at 174/114.  Will increase metoprolol as above and continue to monitor. -Can consider increasing irbesartan if needed.    Social issues -History of alcoholism in the past.  Recent job loss over the last 6 months with no insurance.  Patient began drinking heavily again. -Patient has been seen by inpatient social worker. -Will need affordable meds and likely needs assistance program for the Eliquis.   Diabetes type 2 -A1c 9.2 -Pt has not been on meds prior to admission.  -Could use Amaryl as it is on the $4 list.   CAD -CCTA in 11/2017 showed calcium score of 838 with 50-75% LAD stenosis.  -Pt should be on statin.    Hyperlipidemia -LDL 106.  -Will start atorvastatin 40 mg daily.  For questions or updates, please contact [place] Please consult [email or web address] for contact info under        Signed, Daune Perch, NP  05/28/2019, 8:54 AM

## 2019-05-28 NOTE — Care Management (Signed)
1642 05-28-19 MATCH completed for the patient; however unable to cover the Jardiance and the Januvia. Case Manager called the Diabetes Coordinator and coordinator felt that if patient needed to go out on any medication it would be the Jardiance. Case Manager was able to get a coupon for year supply free coupon for Jardiance- pt is aware to go through patient assistance for both Januvia and Jardiance if he continues to take it. Case Manager is checking with [facility] for any Jardiance samples-samples available and provided to patient. Case Manager unable to assist with Januvia. No further needs from Case Manager at this time. Bethena Roys, RN,BSN Case Manager

## 2019-05-28 NOTE — Progress Notes (Signed)
Patient had a 2.23 sec pause per CCMD. Pt asymptomatic. Will continue to monitor pt.

## 2019-05-28 NOTE — Care Management (Signed)
1447 05-28-19 Case Manager spoke with patient and he wants to get medications [facility] prior to leaving. Patient will get his 30 day free from the hospital and then will fill out the patient assistance application vis the company; to see if can be approved for 1 year supply free. Patient has hospital follow up appointment scheduled. No further needs from Case Manager at this time. Bethena Roys, RN,BSN Case Manager

## 2019-05-28 NOTE — Discharge Summary (Addendum)
Physician Discharge Summary  Kyle Dawson UMP:[id number] DOB: Aug 10, 1965 DOA: 05/25/2019  PCP: Elby Showers, MD  Admit date: 05/25/2019 Discharge date: 05/28/2019 Consultations: Cardiology, psychiatry Admitted From: home Disposition: home  Discharge Diagnoses:  Principal Problem:   Atrial fibrillation with rapid ventricular response ([place]) Active Problems:   Alcohol use   Abdominal pain   Odynophagia   Demand ischemia ([place])   Chronic post-traumatic stress disorder (PTSD)  Hospital Course Summary: 54 y.o.malewith medical history significant ofA. fib, type 2 diabetes, hypertension, hyperlipidemia, alcohol use disorder, history of squamous cell carcinoma presenting to the ED via EMS with a chief complaint of chest pain. Patient reported binge drinking since 1/4 for about a week followed by dry heaving/odonophagia. Symptoms improved after he took probiotics and some over-the-counter medications and he was able to tolerate drinking some smoothies but not been taking meds. Last drink 1/11. Reports left-sided aching chest pain which is present even at rest X 3 days. No heart palpitations. 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 Has depression and sees a Retail banker.   Patient does admit to medication noncompliance and states has not taken any of his medications for the last few weeks. Hospital course:Patient admitted with atrial fibrillation RVR, started on Cardizem drip. Seen by cardiology. Elevated Trop noted, likely related to demand ischemia.  Started on heparin drip and then transition to Eliquis.  Hgb stable. Potassium 3.3, Mg 1.8 on 1/17 , BG 223, HgbA1C 9.2  1.  Paroxysmal atrial fibrillation with rapid ventricular rate: Likely exacerbated by alcohol binging and medication noncompliance.  Patient admitted with Cardizem and heparin drip.  Seen by cardiology. They have been following along and have transition to oral Cardizem to 40 mg p.o. daily now.  They have also resumed  Toprol-XL at 100 mg daily.  He was previously on amlodipine which has been discontinued as patient now on Cardizem.  Echocardiogram showed moderate LVH with preserved EF. Cardiology plans to follow-up as outpatient after 4 weeks of uninterrupted anticoagulation to consider DC cardioversion if persists to be in A. fib.  2. GERD with possible alcoholic gastritis/esophagitis: Patient was started on PPI, he continued to have odynophagia and added sucralfate.  Currently symptoms improved and tolerating diet well.  3. Uncontrolled hypertension: In the setting of medication noncompliance and possible mild alcohol withdrawal. Resumed home medications including Avapro and metoprolol as discussed above. He was previously taking Avapro 300 mg, cardiology recommend resuming it at 160m yesterday but increased to home dose of 3024mnow given persistently elevated BP. Off Norvasc as patient now on p.o. Cardizem . Patient is a nuMarine scientistnd able to check his blood pressures at home. Will prescribe hydralazine for as needed use at home if blood pressure remains elevated (currently improved systolic pressures but diastolic still up).  4. History of alcohol abuse: Patient was monitored with CIWA protocol.  He has not had any Ativan requirements per protocol over 48 hours.  Heart rate now better control and blood pressure still remains somewhat high.  He is anxious to go home.  5. Diabetes Mellitus: AIC elevated at 9.2.  Blood glucose 180s to 250 during the hospital course.  Resumed home medications including Jardiance, sitagliptin.  Recommended diabetic diet and compliance with medications. Addendum: Per pharmacy Sitagliptin not covered under charity care, changed to glipizide.   6.   PTSD/depression: He was seen by psychiatry on 1/16, started on Prozac.  They also recommended to consider naltrexone as outpatient to reduce alcohol craving.  Recommend outpatient psychiatry follow-up and referral to [place] groups upon  discharge.  Patient states he is scheduled to see psychiatry next week.  He denied any suicidal or homicidal ideations.  States voluntarily quit his job as a Marine scientist and relapsed on alcohol binging after separated from his girlfriend recently.  7.  Hyponatremia, hypokalemia: Hyponatremia likely secondary to beer potomania.  Improved now.  Potassium replaced and corrected before discharge.  Patient previously prescribed Lasix at home but denies any leg swellings and was not taking prior to this admission.  Hold for now.  Can resume Lasix as outpatient upon cardiology follow-up if deemed necessary.  8. Medication noncompliance: Patient seen by social worker and 30-day medication pickup through charity care arranged.  Issued prescriptions for all home medications.  Also noted to be on several multivitamin/over-the-counter herbal meds.  Would hold off on these supplements except for multivitamin, thiamine and folate.  Advised patient that some of these could interfere with anticoagulants.   Discharge Exam:  Vitals:   05/28/19 0853 05/28/19 1324  BP:  (!) 149/109  Pulse: (!) 115 85  Resp:    Temp:  98.7 F (37.1 C)  SpO2:  98%   Vitals:   05/28/19 0530 05/28/19 0852 05/28/19 0853 05/28/19 1324  BP: (!) 170/110 (!) 174/114  (!) 149/109  Pulse: 61  (!) 115 85  Resp:      Temp: 98.6 F (37 C)   98.7 F (37.1 C)  TempSrc: Oral   Oral  SpO2: 96%   98%  Weight: 100 kg     Height:        General: Pt is alert, awake, not in acute distress Cardiovascular: RRR, S1/S2 +, no rubs, no gallops Respiratory: CTA bilaterally, no wheezing, no rhonchi Abdominal: Soft, NT, ND, bowel sounds + Extremities: no edema, no cyanosis  Discharge Condition:Stable CODE STATUS: Full code Diet recommendation: Low-salt diabetic Recommendations for Outpatient Follow-up:  1. Follow up with PCP: 1 week 2. Follow up with consultants: Cardiology clinic Dr. Harrington Challenger in 1 month, primary psychiatrist next week 3. Please  obtain follow up labs including: Liver profile, potassium  Home Health services upon discharge:  Equipment/Devices upon discharge:   Discharge Instructions:  Discharge Instructions    Call MD for:  difficulty breathing, headache or visual disturbances   Complete by: As directed    Call MD for:  extreme fatigue   Complete by: As directed    Call MD for:  extreme fatigue   Complete by: As directed    Call MD for:  persistant dizziness or light-headedness   Complete by: As directed    Call MD for:  persistant dizziness or light-headedness   Complete by: As directed    Call MD for:  persistant nausea and vomiting   Complete by: As directed    Call MD for:  persistant nausea and vomiting   Complete by: As directed    Call MD for:  severe uncontrolled pain   Complete by: As directed    Call MD for:  severe uncontrolled pain   Complete by: As directed    Call MD for:  temperature >100.4   Complete by: As directed    Call MD for:  temperature >100.4   Complete by: As directed    Diet - low sodium heart healthy   Complete by: As directed    Increase activity slowly   Complete by: As directed    Increase activity slowly   Complete by: As directed  Allergies as of 05/28/2019      Reactions   Oxycodone Anxiety, Other (See Comments)   "I drank it with beer and it made me hyper"      Medication List    STOP taking these medications   amLODipine 10 MG tablet Commonly known as: NORVASC   ASHWAGANDHA PO   aspirin EC 325 MG tablet   BILBERRY EXTRACT PO   COD LIVER OIL PO   COLLAGEN PO   DHA PO   DHEA 50 MG Caps   FENUGREEK PO   furosemide 40 MG tablet Commonly known as: LASIX   GINSENG PO   L-GLUTAMINE PO   rivaroxaban 20 MG Tabs tablet Commonly known as: XARELTO   Rybelsus 3 MG Tabs Generic drug: Semaglutide   sitaGLIPtin 100 MG tablet Commonly known as: JANUVIA   VALERIAN ROOT PO   VITAMIN A PO   vitamin E 180 MG (400 UNITS) capsule Generic  drug: vitamin E   zinc gluconate 50 MG tablet     TAKE these medications   apixaban 5 MG Tabs tablet Commonly known as: ELIQUIS Take 1 tablet (5 mg total) by mouth 2 (two) times daily.   atorvastatin 10 MG tablet Commonly known as: LIPITOR Take 1 tablet (10 mg total) by mouth daily.   calcium carbonate 500 MG chewable tablet Commonly known as: TUMS - dosed in mg elemental calcium Chew 1 tablet (200 mg of elemental calcium total) by mouth 3 (three) times daily as needed for indigestion or heartburn.   diltiazem 240 MG 24 hr capsule Commonly known as: CARDIZEM CD Take 1 capsule (240 mg total) by mouth daily. Start taking on: May 29, 2019   empagliflozin 25 MG Tabs tablet Commonly known as: JARDIANCE Take 25 mg by mouth daily.   FISH OIL PO Take 1 tablet by mouth daily.   FLUoxetine 20 MG capsule Commonly known as: PROZAC Take 1 capsule (20 mg total) by mouth daily. Start taking on: May 28, 1608   folic acid 1 MG tablet Commonly known as: FOLVITE Take 1 tablet (1 mg total) by mouth daily.   glipiZIDE 5 MG tablet Commonly known as: GLUCOTROL Take 0.5 tablets (2.5 mg total) by mouth 2 (two) times daily before a meal.   hydrALAZINE 25 MG tablet Commonly known as: APRESOLINE Take 1 tablet (25 mg total) by mouth 3 (three) times daily as needed. As needed for systolic BP >960, AVW>098   irbesartan 300 MG tablet Commonly known as: AVAPRO Take 1 tablet (300 mg total) by mouth daily.   metoprolol succinate 100 MG 24 hr tablet Commonly known as: TOPROL-XL Take 1 tablet (100 mg total) by mouth daily. Take with or immediately following a meal. What changed:   medication strength  how much to take  when to take this   multivitamin with minerals Tabs tablet Take 1 tablet by mouth daily.   OneTouch Delica Lancets 11B Misc USE TO CHECK BLOOD SUGAR ONCE DAILY   OneTouch Verio test strip Generic drug: glucose blood USE AS DIRECTED TO CHECK BLOOD SUGAR ONCE  DAILY What changed: See the new instructions.   pantoprazole 40 MG tablet Commonly known as: Protonix Take 1 tablet (40 mg total) by mouth 2 (two) times daily.   sucralfate 1 GM/10ML suspension Commonly known as: CARAFATE Take 10 mLs (1 g total) by mouth 4 (four) times daily -  with meals and at bedtime.   thiamine 100 MG tablet Take 1 tablet (100 mg total) by mouth daily.  traZODone 100 MG tablet Commonly known as: DESYREL Take 1 tablet (100 mg total) by mouth at bedtime as needed for sleep.   VITAMIN B COMPLEX PO Take 2 tablets by mouth daily.   vitamin C 1000 MG tablet Take 2,000 mg by mouth daily.   Vitamin D3 50 MCG (2000 UT) Tabs Take 4,000 Units by mouth daily.      Follow-up Information    Baxley, Cresenciano Lick, MD. Go on 06/08/2019.   Specialty: Internal Medicine Why: at 12pm for hospital followup Contact information: [address] [id number] [phone]        Daune Perch, NP Follow up.   Specialty: Cardiology Why: Cardiology hospital follow up on 06/11/19 at 2:15 pm, Please arrive 15 minutes early for check in. Contact information: [address] [place] [address] [phone]          Allergies  Allergen Reactions  . Oxycodone Anxiety and Other (See Comments)    "I drank it with beer and it made me hyper"      The results of significant diagnostics from this hospitalization (including imaging, microbiology, ancillary and laboratory) are listed below for reference.    Labs: BNP (last 3 results) Recent Labs    09/25/18 1540  BNP 89.2   Basic Metabolic Panel: Recent Labs  Lab 05/25/19 2029 05/27/19 0446 05/28/19 0949  NA 131* 135 132*  K 3.5 3.3* 3.7  CL 92* 95* 96*  CO2 23 25 22   GLUCOSE 254* 223* 242*  BUN 37* 15 13  CREATININE 1.36* 0.98 1.06  CALCIUM 9.9 8.6* 9.0  MG 1.8 1.8  --    Liver Function Tests: Recent Labs  Lab 05/25/19 2029  AST 29  ALT 44  ALKPHOS 55  BILITOT 2.2*  PROT  6.6  ALBUMIN 3.7   Recent Labs  Lab 05/25/19 2050  LIPASE 43   No results for input(s): AMMONIA in the last 168 hours. CBC: Recent Labs  Lab 05/25/19 2029 05/27/19 0446 05/28/19 0415  WBC 8.5 5.3 4.9  NEUTROABS 5.9  --   --   HGB 18.4* 16.1 15.1  HCT 51.1 44.6 42.3  MCV 89.3 88.5 90.4  PLT 176 118* 124*   Cardiac Enzymes: No results for input(s): CKTOTAL, CKMB, CKMBINDEX, TROPONINI in the last 168 hours. BNP: Invalid input(s): POCBNP CBG: Recent Labs  Lab 05/27/19 1632 05/27/19 2120 05/28/19 0741 05/28/19 1113 05/28/19 1621  GLUCAP 201* 190* 172* 230* 215*   D-Dimer No results for input(s): DDIMER in the last 72 hours. Hgb A1c Recent Labs    05/26/19 0434  HGBA1C 9.2*   Lipid Profile Recent Labs    05/26/19 0434  CHOL 213*  HDL 79  LDLCALC 106*  TRIG 142  CHOLHDL 2.7   Thyroid function studies No results for input(s): TSH, T4TOTAL, T3FREE, THYROIDAB in the last 72 hours.  Invalid input(s): FREET3 Anemia work up No results for input(s): VITAMINB12, FOLATE, FERRITIN, TIBC, IRON, RETICCTPCT in the last 72 hours. Urinalysis    Component Value Date/Time   COLORURINE YELLOW 11/27/2017 1330   APPEARANCEUR CLEAR 11/27/2017 1330   LABSPEC 1.023 11/27/2017 1330   PHURINE 7.0 11/27/2017 1330   GLUCOSEU >=500 (A) 11/27/2017 1330   HGBUR NEGATIVE 11/27/2017 1330   BILIRUBINUR NEGATIVE 11/27/2017 1330   BILIRUBINUR negative 04/28/2016 1153   KETONESUR 5 (A) 11/27/2017 1330   PROTEINUR NEGATIVE 11/27/2017 1330   UROBILINOGEN negative 04/28/2016 1153   NITRITE NEGATIVE 11/27/2017 1330   LEUKOCYTESUR NEGATIVE  11/27/2017 1330   Sepsis Labs Invalid input(s): PROCALCITONIN,  WBC,  LACTICIDVEN Microbiology Recent Results (from the past 240 hour(s))  SARS CORONAVIRUS 2 (TAT 6-24 HRS) Nasopharyngeal Nasopharyngeal Swab     Status: None   Collection Time: 05/25/19  8:50 PM   Specimen: Nasopharyngeal Swab  Result Value Ref Range Status   SARS Coronavirus 2  NEGATIVE NEGATIVE Final    Comment: (NOTE) SARS-CoV-2 target nucleic acids are NOT DETECTED. The SARS-CoV-2 RNA is generally detectable in upper and lower respiratory specimens during the acute phase of infection. Negative results do not preclude SARS-CoV-2 infection, do not rule out co-infections with other pathogens, and should not be used as the sole basis for treatment or other patient management decisions. Negative results must be combined with clinical observations, patient history, and epidemiological information. The expected result is Negative. Fact Sheet for Patients: SugarRoll.be Fact Sheet for Healthcare Providers: https://www.woods-mathews.com/ This test is not yet approved or cleared by the [place] FDA and  has been authorized for detection and/or diagnosis of SARS-CoV-2 by FDA under an Emergency Use Authorization (EUA). This EUA will remain  in effect (meaning this test can be used) for the duration of the COVID-19 declaration under Section 56 4(b)(1) of the Act, 21 U.S.C. section 360bbb-3(b)(1), unless the authorization is terminated or revoked sooner. Performed at [facility], [place] [address], [place], [place] [address]     Procedures/Studies: DG Chest Port 1 View  Result Date: 05/25/2019 CLINICAL DATA:  Chest pain for 2 days EXAM: PORTABLE CHEST 1 VIEW COMPARISON:  None. FINDINGS: The heart size and mediastinal contours are within normal limits. Both lungs are clear. The visualized skeletal structures are unremarkable. IMPRESSION: No active disease. Electronically Signed   By: Inez Catalina M.D.   On: 05/25/2019 21:25   ECHOCARDIOGRAM COMPLETE  Result Date: 05/26/2019   ECHOCARDIOGRAM REPORT   Patient Name:   Kyle Dawson Date of Exam: 05/26/2019 Medical Rec #:  [id number]        Height:       71.0 in Accession #:    [id number]       Weight:       219.8 lb Date of Birth:  [birth year]        BSA:          2.19 m  Patient Age:    24 years         BP:           141/82 mmHg Patient Gender: M                HR:           102 bpm. Exam Location:  Inpatient Procedure: 2D Echo, Color Doppler, Cardiac Doppler and Intracardiac            Opacification Agent Indications:    I48.91* Unspecified atrial fibrillation  History:        Patient has prior history of Echocardiogram examinations, most                 recent 09/26/2018. Arrythmias:Atrial Fibrillation; Risk                 Factors:Hypertension and Diabetes. Persistent Left SVC.  Sonographer:    Raquel Sarna Senior RDCS Referring Phys: [id number] Shela Leff  Sonographer Comments: Technically difficult study due to poor echo windows. IMPRESSIONS  1. Left ventricular ejection fraction, by visual estimation, is 60 to 65%. The left ventricle has normal function. There is moderately  increased left ventricular hypertrophy.  2. Left ventricular diastolic function could not be evaluated.  3. The left ventricle has no regional wall motion abnormalities.  4. Global right ventricle was not well visualized.The right ventricular size is not well visualized. Right vetricular wall thickness was not assessed.  5. Left atrial size was normal.  6. Right atrial size was normal.  7. The mitral valve is grossly normal. No evidence of mitral valve regurgitation.  8. The tricuspid valve is grossly normal.  9. The aortic valve was not well visualized. Aortic valve regurgitation is not visualized. 10. The pulmonic valve was grossly normal. Pulmonic valve regurgitation is not visualized. 11. 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 12. The inferior vena cava is normal in size with <50% respiratory variability, suggesting right atrial pressure of 8 mmHg. 13. The interatrial septum was not well visualized. FINDINGS  Left Ventricle: Left ventricular ejection fraction, by visual estimation, is 60 to 65%. The left ventricle has normal function. The left ventricle  has no regional wall motion abnormalities. There is moderately increased left ventricular hypertrophy. The  left ventricular diastology could not be evaluated due to atrial fibrillation. Left ventricular diastolic function could not be evaluated. Right Ventricle: The right ventricular size is not well visualized. Right vetricular wall thickness was not assessed. Global RV systolic function is was not well visualized. Left Atrium: Left atrial size was normal in size. Right Atrium: Right atrial size was normal in size Pericardium: There is no evidence of pericardial effusion. Mitral Valve: The mitral valve is grossly normal. No evidence of mitral valve regurgitation. Tricuspid Valve: The tricuspid valve is grossly normal. Tricuspid valve regurgitation is trivial. Aortic Valve: The aortic valve was not well visualized. Aortic valve regurgitation is not visualized. Pulmonic Valve: The pulmonic valve was grossly normal. Pulmonic valve regurgitation is not visualized. Pulmonic regurgitation is not visualized. Aorta: The aortic root and ascending aorta are structurally normal, with no evidence of dilitation. Venous: Left partial anomalous pulmonary venous return. The inferior vena cava is normal in size with less than 50% respiratory variability, suggesting right atrial pressure of 8 mmHg. IAS/Shunts: The interatrial septum was not well visualized.  LEFT VENTRICLE PLAX 2D LVIDd:         4.20 cm LVIDs:         2.60 cm LV PW:         1.40 cm LV IVS:        1.20 cm LVOT diam:     1.90 cm LV SV:         54 ml LV SV Index:   23.88 LVOT Area:     2.84 cm  RIGHT VENTRICLE RV S prime:     12.40 cm/s TAPSE (M-mode): 1.1 cm LEFT ATRIUM             Index       RIGHT ATRIUM           Index LA diam:        3.80 cm 1.73 cm/m  RA Area:     14.20 cm LA Vol (A2C):   51.8 ml 23.60 ml/m RA Volume:   35.00 ml  15.95 ml/m LA Vol (A4C):   50.4 ml 22.96 ml/m LA Biplane Vol: 53.6 ml 24.42 ml/m  AORTIC VALVE LVOT Vmax:   102.35 cm/s LVOT  Vmean:  74.100 cm/s LVOT VTI:    0.169 m  AORTA Ao Root diam: 3.10 cm  SHUNTS Systemic VTI:  0.17 m Systemic Diam: 1.90 cm  Lyman Bishop MD Electronically signed by Lyman Bishop MD Signature Date/Time: 05/26/2019/2:10:04 PM    Final    US Abdomen Limited RUQ  Result Date: 05/26/2019 CLINICAL DATA:  Increased LFTs EXAM: ULTRASOUND ABDOMEN LIMITED RIGHT UPPER QUADRANT COMPARISON:  None. FINDINGS: Gallbladder: No gallstones or wall thickening visualized. No sonographic Murphy sign noted by sonographer. Common bile duct: Diameter: Normal at 4 mm Liver: Increased liver echogenicity. No duct dilatation. Portal vein is patent on color Doppler imaging with normal direction of blood flow towards the liver. Other: None. IMPRESSION: 1. Normal gallbladder and common bile duct. 2. Increased liver echogenicity commonly represents hepatic steatosis. Electronically Signed   By: Suzy Bouchard M.D.   On: 05/26/2019 06:08    Time coordinating discharge: Over 30 minutes  SIGNED:   Guilford Shi, MD  [facility] 05/28/2019, 5:04 PM Pager : [phone]

## 2019-05-29 ENCOUNTER — Telehealth: Payer: Self-pay

## 2019-05-29 NOTE — Telephone Encounter (Signed)
Called patient after being discharged from the hospital on 05/28/2019, he did not pick up the phone I will try again tomorrow.

## 2019-05-30 NOTE — Telephone Encounter (Signed)
Transition Care Management Follow-up Telephone Call  Date of discharge and from where: 05/27/2018  How have you been since you were released from the hospital? Hypertensive   Any questions or concerns? No   Items Reviewed:  Did the pt receive and understand the discharge instructions provided? Yes   Medications obtained and verified? Yes   Any new allergies since your discharge? No   Dietary orders reviewed? Yes  Do you have support at home? No   Functional Questionnaire: (I = Independent and D = Dependent) ADLs: independent    Bathing/Dressing- Independent   Meal Prep-  Independent   Eating-  Independent   Maintaining continence- continence      Transferring/Ambulation-  Independent   Managing Meds-  Independent   Follow up appointments reviewed:   PCP Hospital f/u appt confirmed? Yes  Scheduled to see 06/08/2019 on @   12:00pm       Specialist Hospital f/u appt confirmed? Yes  Scheduled to see 06/10/2018 on  @ 2;00pm.   Are transportation arrangements needed? Yes   If their condition worsens, is the pt aware to call PCP or go to the Emergency Dept.? Yes  Was the patient provided with contact information for the PCP's office or ED? Yes  Was to pt encouraged to call back with questions or concerns? Yes

## 2019-05-30 NOTE — Addendum Note (Signed)
Addended by: Mady Haagensen on: 05/30/2019 12:35 PM   Modules accepted: Orders

## 2019-06-08 ENCOUNTER — Other Ambulatory Visit: Payer: Self-pay

## 2019-06-08 ENCOUNTER — Ambulatory Visit (INDEPENDENT_AMBULATORY_CARE_PROVIDER_SITE_OTHER): Payer: Self-pay | Admitting: Internal Medicine

## 2019-06-08 VITALS — BP 110/70 | HR 68 | Temp 98.0°F | Ht 71.0 in | Wt 227.0 lb

## 2019-06-08 DIAGNOSIS — E119 Type 2 diabetes mellitus without complications: Secondary | ICD-10-CM

## 2019-06-08 DIAGNOSIS — Z7901 Long term (current) use of anticoagulants: Secondary | ICD-10-CM

## 2019-06-08 DIAGNOSIS — F329 Major depressive disorder, single episode, unspecified: Secondary | ICD-10-CM

## 2019-06-08 DIAGNOSIS — Z87898 Personal history of other specified conditions: Secondary | ICD-10-CM

## 2019-06-08 DIAGNOSIS — F32A Depression, unspecified: Secondary | ICD-10-CM

## 2019-06-08 DIAGNOSIS — Z8679 Personal history of other diseases of the circulatory system: Secondary | ICD-10-CM

## 2019-06-08 DIAGNOSIS — R7989 Other specified abnormal findings of blood chemistry: Secondary | ICD-10-CM

## 2019-06-08 DIAGNOSIS — F419 Anxiety disorder, unspecified: Secondary | ICD-10-CM

## 2019-06-08 DIAGNOSIS — I10 Essential (primary) hypertension: Secondary | ICD-10-CM
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
## 2019-06-09 LAB — COMPLETE METABOLIC PANEL WITH GFR
AG Ratio: 1.7 (calc) (ref 1.0–2.5)
ALT: 48 U/L — ABNORMAL HIGH (ref 9–46)
AST: 21 U/L (ref 10–35)
Albumin: 4.3 g/dL (ref 3.6–5.1)
Alkaline phosphatase (APISO): 41 U/L (ref 35–144)
BUN: 20 mg/dL (ref 7–25)
CO2: 28 mmol/L (ref 20–32)
Calcium: 9.9 mg/dL (ref 8.6–10.3)
Chloride: 103 mmol/L (ref 98–110)
Creat: 1.23 mg/dL (ref 0.70–1.33)
GFR, Est African American: 76 mL/min/{1.73_m2} (ref >=60)
GFR, Est Non African American: 66 mL/min/{1.73_m2} (ref >=60)
Globulin: 2.5 g/dL (calc) (ref 1.9–3.7)
Glucose, Bld: 167 mg/dL — ABNORMAL HIGH (ref 65–99)
Potassium: 4.4 mmol/L (ref 3.5–5.3)
Sodium: 139 mmol/L (ref 135–146)
Total Bilirubin: 0.7 mg/dL (ref 0.2–1.2)
Total Protein: 6.8 g/dL (ref 6.1–8.1)

## 2019-06-09 LAB — CBC WITH DIFFERENTIAL/PLATELET
Absolute Monocytes: 590 cells/uL (ref 200–950)
Basophils Absolute: 71 cells/uL (ref 0–200)
Basophils Relative: 1.2 %
Eosinophils Absolute: 41 cells/uL (ref 15–500)
Eosinophils Relative: 0.7 %
HCT: 46.3 % (ref 38.5–50.0)
Hemoglobin: 16.2 g/dL (ref 13.2–17.1)
Lymphs Abs: 1168 cells/uL (ref 850–3900)
MCH: 32.5 pg (ref 27.0–33.0)
MCHC: 35 g/dL (ref 32.0–36.0)
MCV: 92.8 fL (ref 80.0–100.0)
MPV: 9.7 fL (ref 7.5–12.5)
Monocytes Relative: 10 %
Neutro Abs: 4030 cells/uL (ref 1500–7800)
Neutrophils Relative %: 68.3 %
Platelets: 300 10*3/uL (ref 140–400)
RBC: 4.99 10*6/uL (ref 4.20–5.80)
RDW: 12.9 % (ref 11.0–15.0)
Total Lymphocyte: 19.8 %
WBC: 5.9 10*3/uL (ref 3.8–10.8)

## 2019-06-09 NOTE — Patient Instructions (Signed)
Continue current medications and follow-up here in March for office visit and hemoglobin A1c.  Keep cardiology appointment.

## 2019-06-11 ENCOUNTER — Ambulatory Visit (INDEPENDENT_AMBULATORY_CARE_PROVIDER_SITE_OTHER): Payer: Self-pay | Admitting: Cardiology

## 2019-06-11 ENCOUNTER — Other Ambulatory Visit: Payer: Self-pay

## 2019-06-11 ENCOUNTER — Other Ambulatory Visit: Payer: Self-pay | Admitting: Cardiology

## 2019-06-11 ENCOUNTER — Encounter: Payer: Self-pay | Admitting: Cardiology

## 2019-06-11 VITALS — BP 126/66 | HR 71 | Ht 70.0 in | Wt 234.8 lb

## 2019-06-11 DIAGNOSIS — I480 Paroxysmal atrial fibrillation: Secondary | ICD-10-CM

## 2019-06-11 DIAGNOSIS — F1010 Alcohol abuse, uncomplicated: Secondary | ICD-10-CM

## 2019-06-11 DIAGNOSIS — Z6831 Body mass index (BMI) 31.0-31.9, adult: Secondary | ICD-10-CM

## 2019-06-11 DIAGNOSIS — E6609 Other obesity due to excess calories: Secondary | ICD-10-CM

## 2019-06-11 DIAGNOSIS — I119 Hypertensive heart disease without heart failure: Secondary | ICD-10-CM

## 2019-06-11 DIAGNOSIS — F32A Depression, unspecified: Secondary | ICD-10-CM

## 2019-06-11 DIAGNOSIS — K2100 Gastro-esophageal reflux disease with esophagitis, without bleeding: Secondary | ICD-10-CM

## 2019-06-11 DIAGNOSIS — E1159 Type 2 diabetes mellitus with other circulatory complications: Secondary | ICD-10-CM

## 2019-06-11 DIAGNOSIS — F329 Major depressive disorder, single episode, unspecified: Secondary | ICD-10-CM

## 2019-06-11 MED ORDER — APIXABAN 5 MG PO TABS: 5.0000 mg | ORAL_TABLET | Freq: Two times a day (BID) | ORAL | 5 refills | Status: DC

## 2019-06-11 MED ORDER — HYDRALAZINE HCL 25 MG PO TABS: 25.0000 mg | ORAL_TABLET | Freq: Three times a day (TID) | ORAL | 11 refills | Status: DC | PRN

## 2019-06-11 MED ORDER — DILTIAZEM HCL ER COATED BEADS 240 MG PO CP24: 240.0000 mg | ORAL_CAPSULE | Freq: Every day | ORAL | 3 refills | Status: DC

## 2019-06-11 MED ORDER — METOPROLOL SUCCINATE ER 100 MG PO TB24: 100.0000 mg | ORAL_TABLET | Freq: Every day | ORAL | 3 refills | Status: DC

## 2019-06-11 MED ORDER — IRBESARTAN 300 MG PO TABS: 300.0000 mg | ORAL_TABLET | Freq: Every day | ORAL | 3 refills | Status: DC

## 2019-06-11 NOTE — Telephone Encounter (Signed)
Prescription refill request for Eliquis received.  Last office visit: 6/30/2020[name] Scr: 1.23,  06/08/2019 Age: 54 y.o. Weight: 106.5 kg   Prescription refill sent.

## 2019-06-11 NOTE — Progress Notes (Signed)
Cardiology Office Note:    Date:  06/11/2019   ID:  AIRON SAHNI, DOB Oct 06, 1965, MRN [id number]  PCP:  Elby Showers, MD  Cardiologist:  Dorris Carnes, MD  Referring MD: Elby Showers, MD   Chief Complaint  Patient presents with  . Hospitalization Follow-up  . Kyle Dawson    History of Present Illness:    Kyle Dawson is a 54 y.o. male with a past medical history significant for paroxysmal Kyle Dawson, type 2 diabetes, hypertension, hyperlipidemia, alcohol abuse who is being seen today for hospital follow-up after admission for Kyle Dawson.  The patient had admitted to drinking large amounts of alcohol on the prior week which was felt to be a trigger for this episode.  The patient was noted to have social issues including loss of his job and insurance.  The patient is noted to have CAD noted on CCTA in 11/2017 with calcium score 838 and 50-75% LAD stenosis.  During the recent hospitalization echocardiogram showed systolic function with EF moderate LVH and valvular abnormalities. Diltiazem was added at 240 mg daily and Toprol-XL was increased to 100 mg daily for improved rate control. CHA2DS2-VASc score of at least 2 (HTN, DM).    He was started on Eliquis for stroke risk reduction.  Plan was to consider outpatient electrical cardioversion after 4 weeks of uninterrupted anticoagulation if continues in Kyle Dawson.  While in the hospital it was noted that he was having issues with esophageal irritation and felt most likely affected candidate for TEE at that time.   The patient had hyponatremia and hypokalemia during hospital admission felt to be related to recent alcohol ingestion.  With prior known CAD by CCTA, the patient was placed on atorvastatin 40 mg daily.  His LDL was noted to be 106.   Mr. Cosens is here today for hospital follow up. EKG shows normal sinus rhythm. Home BPs have been running high 160's but BP much better in PCP office and here today.  BP is high in morning, down to 130's-140's mid day after meds and taking prn hydralazine. Question if his home BP monitor is accurate.   Mr. Shaddock notes that he has made a lot of changes since his hospitalization. He has changed his diet, 90% vegetables and fruit, with salmon twice a week, anchovies, sardine and Kippers. Oatmeal with flax seeds, chia seeds, pumpkin seeds, berries. He has not had any alcohol since prior to the hospital. He is taking trazadone which he was told helps his alcohol cravings.    He is working on getting patient assistance Eliquis.   Cardiac studies   Echocardiogram 05/26/2019 IMPRESSIONS    1. Left ventricular ejection fraction, by visual estimation, is 60 to  65%. The left ventricle has normal function. There is moderately increased  left ventricular hypertrophy.  2. Left ventricular diastolic function could not be evaluated.  3. The left ventricle has no regional wall motion abnormalities.  4. Global right ventricle was not well visualized.The right ventricular  size is not well visualized. Right vetricular wall thickness was not  assessed.  5. Left Kyle size was normal.  6. Right Kyle size was normal.  7. The mitral valve is grossly normal. No evidence of mitral valve  regurgitation.  8. The tricuspid valve is grossly normal.  9. The aortic valve was not well visualized. Aortic valve regurgitation  is not visualized.  10. The pulmonic valve was grossly normal. Pulmonic valve regurgitation is  not visualized.  11. Left partial anomalous pulmonary venous return. (persisent left SVC)  -dilated coronary sinus.  12. The inferior vena cava is normal in size with <50% respiratory  variability, suggesting right Kyle pressure of 8 mmHg.  13. The interatrial septum was not well visualized.   Past Medical History:  Diagnosis Date  . Basal cell carcinoma   . Chest pain   . Diabetes mellitus without complication ([place])   . Edema    lower  extremity  . Hypertension   . Squamous cell carcinoma    R ear, nose, each side of face    Past Surgical History:  Procedure Laterality Date  . Removal basal cell carcinoma    . Removal squamous cell carcinoma      Current Medications: Current Meds  Medication Sig  . atorvastatin (LIPITOR) 10 MG tablet Take 1 tablet (10 mg total) by mouth daily.  Marland Kitchen diltiazem (CARDIZEM CD) 240 MG 24 hr capsule Take 1 capsule (240 mg total) by mouth daily.  . empagliflozin (JARDIANCE) 25 MG TABS tablet Take 25 mg by mouth daily.  Marland Kitchen FLUoxetine (PROZAC) 20 MG capsule Take 1 capsule (20 mg total) by mouth daily.  Marland Kitchen glipiZIDE (GLUCOTROL) 5 MG tablet Take 0.5 tablets (2.5 mg total) by mouth 2 (two) times daily before a meal.  . hydrALAZINE (APRESOLINE) 25 MG tablet Take 1 tablet (25 mg total) by mouth 3 (three) times daily as needed. As needed for systolic BP >945, WTU>882  . irbesartan (AVAPRO) 300 MG tablet Take 1 tablet (300 mg total) by mouth daily.  . metoprolol succinate (TOPROL-XL) 100 MG 24 hr tablet Take 1 tablet (100 mg total) by mouth daily. Take with or immediately following a meal.  . Omega-3 Fatty Acids (FISH OIL PO) Take 1 tablet by mouth daily.   Glory Rosebush Delica Lancets 80K MISC USE TO CHECK BLOOD SUGAR ONCE DAILY  . ONETOUCH VERIO test strip USE AS DIRECTED TO CHECK BLOOD SUGAR ONCE DAILY  . pantoprazole (PROTONIX) 40 MG tablet Take 1 tablet (40 mg total) by mouth 2 (two) times daily.  . traZODone (DESYREL) 100 MG tablet Take 1 tablet (100 mg total) by mouth at bedtime as needed for sleep.  . [DISCONTINUED] apixaban (ELIQUIS) 5 MG TABS tablet Take 1 tablet (5 mg total) by mouth 2 (two) times daily.  . [DISCONTINUED] diltiazem (CARDIZEM CD) 240 MG 24 hr capsule Take 1 capsule (240 mg total) by mouth daily.  . [DISCONTINUED] hydrALAZINE (APRESOLINE) 25 MG tablet Take 1 tablet (25 mg total) by mouth 3 (three) times daily as needed. As needed for systolic BP >349, ZPH>150  . [DISCONTINUED]  irbesartan (AVAPRO) 300 MG tablet Take 1 tablet (300 mg total) by mouth daily.  . [DISCONTINUED] metoprolol succinate (TOPROL-XL) 100 MG 24 hr tablet Take 1 tablet (100 mg total) by mouth daily. Take with or immediately following a meal.     Allergies:   Oxycodone   Social History   Socioeconomic History  . Marital status: Significant Other    Spouse name: Not on file  . Number of children: 0  . Years of education: ADN  . Highest education level: Not on file  Occupational History  . Occupation: ER [employer]    Employer: [employer]  . Occupation: [employer]  Tobacco Use  . Smoking status: Former Research scientist (life sciences)  . Smokeless tobacco: Never Used  Substance and Sexual Activity  . Alcohol use: Yes  . Drug use: No  . Sexual activity: Not on file  Other Topics  Concern  . Not on file  Social History Narrative   Patient drinks 1 cup of coffee a day    Social Determinants of Health   Financial Resource Strain:   . Difficulty of Paying Living Expenses: Not on file  Food Insecurity:   . Worried About Charity fundraiser in the Last Year: Not on file  . Ran Out of Food in the Last Year: Not on file  Transportation Needs:   . Lack of Transportation (Medical): Not on file  . Lack of Transportation (Non-Medical): Not on file  Physical Activity:   . Days of Exercise per Week: Not on file  . Minutes of Exercise per Session: Not on file  Stress:   . Feeling of Stress : Not on file  Social Connections:   . Frequency of Communication with Friends and Family: Not on file  . Frequency of Social Gatherings with Friends and Family: Not on file  . Attends Religious Services: Not on file  . Active Member of Clubs or Organizations: Not on file  . Attends Archivist Meetings: Not on file  . Marital Status: Not on file     Family History: The patient's family history includes CAD in his maternal grandfather; Heart failure in his mother and paternal grandfather; Hypertension in his father and mother;  Peripheral vascular disease (age of onset: 81) in his mother. ROS:   Please see the history of present illness.     All other systems reviewed and are negative.   EKG:  EKG is ordered today.  The ekg ordered today demonstrates normal sinus rhythm 71 bpm.   Recent Labs: 09/25/2018: B Natriuretic Peptide 42.9; TSH 1.383 05/27/2019: Magnesium 1.8 06/08/2019: ALT 48; BUN 20; Creat 1.23; Hemoglobin 16.2; Platelets 300; Potassium 4.4; Sodium 139   Recent Lipid Panel    Component Value Date/Time   CHOL 213 (H) 05/26/2019 0434   TRIG 142 05/26/2019 0434   HDL 79 05/26/2019 0434   CHOLHDL 2.7 05/26/2019 0434   VLDL 28 05/26/2019 0434   LDLCALC 106 (H) 05/26/2019 0434    Physical Exam:    VS:  BP 126/66   Pulse 71   Ht 5' 10"  (1.778 m)   Wt 234 lb 12.8 oz (106.5 kg)   SpO2 96%   BMI 33.69 kg/m     Wt Readings from Last 6 Encounters:  06/11/19 234 lb 12.8 oz (106.5 kg)  06/08/19 227 lb (103 kg)  05/28/19 220 lb 8 oz (100 kg)  11/07/18 220 lb 3.2 oz (99.9 kg)  10/23/18 228 lb 12.8 oz (103.8 kg)  10/13/18 226 lb (102.5 kg)     Physical Exam  Constitutional: He is oriented to person, place, and time. He appears well-developed and well-nourished. No distress.  HENT:  Head: Normocephalic and atraumatic.  Neck: No JVD present.  Cardiovascular: Normal rate, regular rhythm, normal heart sounds and intact distal pulses. Exam reveals no gallop and no friction rub.  No murmur heard. Pulmonary/Chest: Effort normal and breath sounds normal. No respiratory distress. He has no wheezes. He has no rales.  Abdominal: Soft. Bowel sounds are normal.  Musculoskeletal:        General: Normal range of motion.     Cervical back: Normal range of motion and neck supple.     Comments: Venous stasis skin changes of lower legs. Trace pretibial edema.   Neurological: He is alert and oriented to person, place, and time.  Skin: Skin is warm and dry.  Psychiatric: He has a normal mood and affect. His  behavior is normal. Judgment and thought content normal.  Vitals reviewed.    ASSESSMENT:    1. Paroxysmal Kyle Dawson (HCC)   2. Hypertensive heart disease without heart failure   3. Type 2 diabetes mellitus with other circulatory complication, without long-term current use of insulin ([place])   4. Gastroesophageal reflux disease with esophagitis without hemorrhage   5. Depression, unspecified depression type   6. Alcohol abuse   7. Class 1 obesity due to excess calories with body mass index (BMI) of 31.0 to 31.9 in adult, unspecified whether serious comorbidity present    PLAN:    In order of problems listed above:  Paroxysmal Kyle Dawson -Patient was hospitalized 05/25/2019-05/28/2019 with Kyle Dawson after binging on alcohol.  Cardizem was increased to 240 mg daily and Toprol-XL increased to 100 mg daily -CHA2DS2-VASc score of at least 2 (HTN, DM).    He was started on Eliquis for stroke risk reduction. -Today pt is in sinus rhythm at 71 bpm.  -Continue current therapy. -Hopefully he will maintain sinus rhythm with the lifestyle changes that he has made- abstinence from alcohol and much improved diet. Pt wants to start exercising but was afraid to with afib. We discussed that exercise will have a positive impact on his heart health and that he should start slowly and build up over time.  -Pt snores and has possible sleep apnea. Consider outpatient sleep study once patient has insurance and definitely if he has recurrent afib with Dawson.  Essential hypertension/hypertensive heart disease -Noted patient was switched to Cardizem at recent hospitalization for Kyle Dawson.  Toprol-XL was increased to 100 mg daily.  Continues on Avapro 300 mg.  He was prescribed hydralazine for as needed use at recent hospital discharge. -Pt reports elevated BP but BP is good here and at recent PCP visit. Upon my recheck, manually 126/66. I advised pt to bring his BP cuff to next appt, here or PCP  to check against manual.   Diabetes type 2, poorly controlled -A1c 9.2.  Continues on Jardiance and glipizide. -Pt has made a lot of positive dietary changes and reports improvement in blood sugars to 140's.   GERD with possible alcoholic gastritis/esophagitis -Patient was started on PPI, sucralfate added. -Pt reports that he was having some esophagitis, but with dietary changes and alcohol cessation, he has been able to stop the carafate. He has had no reflux issues for about 10 days.   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  He sees a Social worker.  Recently started on Prozac.  Alcohol abuse -Has not had alcohol since 05/21/19. He reports that it has not been hard and his stomach issues are greatly improved.   Obesity, Class 1 -Body mass index is 33.69 kg/m. -Pt is making a lot of dietary changes.   Medication Adjustments/Labs and Tests Ordered: Current medicines are reviewed at length with the patient today.  Concerns regarding medicines are outlined above. Labs and tests ordered and medication changes are outlined in the patient instructions below:  Patient Instructions  Medication Instructions:  Your physician recommends that you continue on your current medications as directed. Please refer to the Current Medication list given to you today.  *If you need a refill on your cardiac medications before your next appointment, please call your pharmacy*  Lab Work: None ordered   If you have labs (blood work) drawn today and your tests are  completely normal, you will receive your results only by: Marland Kitchen MyChart Message (if you have MyChart) OR . A paper copy in the mail If you have any lab test that is abnormal or we need to change your treatment, we will call you to review the results.  Testing/Procedures: None ordered  Follow-Up: You are scheduled to see Dorris Carnes, MD on 09/17/2019 @ 2:20 PM   Other Instructions Call [place]  [phone]  Lifestyle Modifications to Prevent and Treat Heart Disease -Recommend heart healthy/Mediterranean diet, with whole grains, fruits, vegetables, fish, lean meats, nuts, olive oil and avocado oil.  -Limit salt intake to less than 2000 mg per day.  -Recommend moderate walking, starting slowly with a few minutes and working up to 3-5 times/week for 30-50 minutes each session. Aim for at least 150 minutes.week. Goal should be pace of 3 miles/hours, or walking 1.5 miles in 30 minutes -Recommend avoidance of tobacco products. Avoid excess alcohol. -Keep blood pressure well controlled, ideally less than 130/80.      Signed, Daune Perch, NP  06/11/2019 4:58 PM    [facility]

## 2019-06-11 NOTE — Patient Instructions (Addendum)
Medication Instructions:  Your physician recommends that you continue on your current medications as directed. Please refer to the Current Medication list given to you today.  *If you need a refill on your cardiac medications before your next appointment, please call your pharmacy*  Lab Work: None ordered   If you have labs (blood work) drawn today and your tests are completely normal, you will receive your results only by: Marland Kitchen MyChart Message (if you have MyChart) OR . A paper copy in the mail If you have any lab test that is abnormal or we need to change your treatment, we will call you to review the results.  Testing/Procedures: None ordered  Follow-Up: You are scheduled to see Dorris Carnes, MD on 09/17/2019 @ 2:20 PM   Other Instructions Call [place] [phone]  Lifestyle Modifications to Prevent and Treat Heart Disease -Recommend heart healthy/Mediterranean diet, with whole grains, fruits, vegetables, fish, lean meats, nuts, olive oil and avocado oil.  -Limit salt intake to less than 2000 mg per day.  -Recommend moderate walking, starting slowly with a few minutes and working up to 3-5 times/week for 30-50 minutes each session. Aim for at least 150 minutes.week. Goal should be pace of 3 miles/hours, or walking 1.5 miles in 30 minutes -Recommend avoidance of tobacco products. Avoid excess alcohol. -Keep blood pressure well controlled, ideally less than 130/80.

## 2019-06-12 ENCOUNTER — Telehealth: Payer: Self-pay

## 2019-06-12 NOTE — Telephone Encounter (Signed)
**Note De-Identified Viola Kinnick Obfuscation** The pt emailed me his [employer] Pt Asst application for Eliquis. I completed the provider page of the application, scanned and emailed it along with a cover letter to Dr Alan Ripper nurse to obtain Dr Alan Ripper signature and to fax all to [place] program.

## 2019-06-12 NOTE — Telephone Encounter (Signed)
Received forms and will have signed by Dr. Harrington Challenger on next clinic day. (06/15/19)

## 2019-06-19 NOTE — Telephone Encounter (Signed)
Letter received from [employer] stating they have approved the pt for assistance with his Eliquis. Approval is good until 06/17/2020. Application Case#: [id number]  The letter states that they have notified the pt of this approval as well.

## 2019-07-02 ENCOUNTER — Other Ambulatory Visit: Payer: Self-pay | Admitting: Internal Medicine

## 2019-07-02 MED FILL — ATORVASTATIN 10 MG TABLET: 10 | 60 days supply | Qty: 60 | Fill #0

## 2019-07-02 MED FILL — CARTIA XT 240 MG CAPSULE SA: 240 | 90 days supply | Qty: 90 | Fill #0

## 2019-07-02 MED FILL — glipiZIDE 5 MG TABS: 5 | 90 days supply | Qty: 90 | Fill #0

## 2019-07-02 MED FILL — METOPROLOL SUCCINATE ER 100: 100 | 90 days supply | Qty: 90 | Fill #0

## 2019-07-02 MED FILL — hydrALAZINE HCL 25 MG TABS: 25 | 90 days supply | Qty: 270 | Fill #0

## 2019-07-02 MED FILL — IRBESARTAN 300 MG TABS: 300 | 90 days supply | Qty: 90 | Fill #0

## 2019-07-02 MED FILL — FREESTYLE LITE METER: 20 days supply | Qty: 1 | Fill #0

## 2019-07-02 NOTE — Telephone Encounter (Signed)
Marcoantonio Legault [phone]  Joaquim Lai called to say he needs refill on his medications, some of them have already be filled by his cardiologist. He has gotten new job that he starts on Wednesday so he wanted to reschedule his appointment in March until late May, because insurance takes effect 90 days after starting job. Ones he still needs are  atorvastatin (LIPITOR) 10 MG tablet FLUoxetine (PROZAC) 20 MG capsule  traZODone (DESYREL) 100 MG tablet  Does he need to get these from Dr Dwyane Dee? JARDIANCE) 25 MG TABS tablet glipiZIDE (GLUCOTROL) 5 MG tablet   [place], [place] Phone:  [phone]  Fax:  [phone]

## 2019-07-04 MED ORDER — ATORVASTATIN CALCIUM 10 MG PO TABS: 10.0000 mg | ORAL_TABLET | Freq: Every day | ORAL | 5 refills | Status: DC

## 2019-07-04 MED ORDER — TRAZODONE HCL 100 MG PO TABS: 100.0000 mg | ORAL_TABLET | Freq: Every evening | ORAL | 5 refills | Status: DC | PRN

## 2019-07-04 MED ORDER — FLUOXETINE HCL 20 MG PO CAPS: 20.0000 mg | ORAL_CAPSULE | Freq: Every day | ORAL | 5 refills | Status: DC

## 2019-07-04 MED FILL — FLUoxetine HCL 20 MG CAPS: 20 | 30 days supply | Qty: 30 | Fill #0

## 2019-07-04 MED FILL — traZODone HCL 100 MG TABS: 100 | 30 days supply | Qty: 30 | Fill #0

## 2019-07-23 ENCOUNTER — Ambulatory Visit: Payer: Self-pay | Admitting: Internal Medicine

## 2019-08-14 MED FILL — traZODone HCL 100 MG TABS: 100 | 30 days supply | Qty: 30 | Fill #1

## 2019-08-14 MED FILL — FUROSEMIDE 40 MG TAB: 40 | 60 days supply | Qty: 60 | Fill #1

## 2019-08-14 MED FILL — FLUoxetine HCL 20 MG CAPS: 20 | 30 days supply | Qty: 30 | Fill #1

## 2019-09-12 MED FILL — FLUoxetine HCL 20 MG CAPS: 20 | 30 days supply | Qty: 30 | Fill #2

## 2019-09-12 MED FILL — traZODone HCL 100 MG TABS: 100 | 30 days supply | Qty: 30 | Fill #2

## 2019-09-17 ENCOUNTER — Ambulatory Visit: Payer: Self-pay | Admitting: Internal Medicine

## 2019-09-18 ENCOUNTER — Telehealth: Payer: Self-pay

## 2019-09-18 ENCOUNTER — Telehealth (INDEPENDENT_AMBULATORY_CARE_PROVIDER_SITE_OTHER): Payer: Self-pay | Admitting: Internal Medicine

## 2019-09-18 ENCOUNTER — Encounter: Payer: Self-pay | Admitting: Internal Medicine

## 2019-09-18 ENCOUNTER — Ambulatory Visit
Admission: RE | Admit: 2019-09-18 | Discharge: 2019-09-18 | Disposition: A | Discharge status: Home / Self Care | Payer: No Typology Code available for payment source | Source: Ambulatory Visit | Attending: Internal Medicine | Admitting: Internal Medicine

## 2019-09-18 ENCOUNTER — Other Ambulatory Visit: Payer: Self-pay

## 2019-09-18 VITALS — BP 138/92 | HR 74 | Temp 98.2°F | Ht 70.0 in | Wt 225.0 lb

## 2019-09-18 DIAGNOSIS — J988 Other specified respiratory disorders: Secondary | ICD-10-CM

## 2019-09-18 DIAGNOSIS — R0602 Shortness of breath: Secondary | ICD-10-CM

## 2019-09-18 MED ORDER — PREDNISONE 10 MG PO TABS: ORAL_TABLET | ORAL | 0 refills | Status: DC

## 2019-09-18 MED ORDER — DOXYCYCLINE HYCLATE 100 MG PO TABS: 100.0000 mg | ORAL_TABLET | Freq: Two times a day (BID) | ORAL | 0 refills | Status: DC

## 2019-09-18 NOTE — Telephone Encounter (Signed)
OK 

## 2019-09-18 NOTE — Progress Notes (Signed)
   Subjective:    Patient ID: Kyle Dawson, male    DOB: 05-17-1965, 54 y.o.   MRN: [id number]  HPI 54 year old Male seen today by interactive audio and video telecommunications due to the coronavirus pandemic.  He is identified using 2 identifiers as Kyle Dawson. Petroni, a patient in this practice.  He is agreeable to visit in this format today.  Patient is currently at work.  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  He has had 2 COVID-19 immunizations the last 1 was August 17, 2019.  He was exposed recently to a coworker with a respiratory infection.  He now has a respiratory infection.  Has had chest congestion cough and some slight shortness of breath.  He has history of paroxysmal atrial fibrillation and is followed by Cardiology but feels that he has been in sinus rhythm.    Review of Systems he has malaise and fatigue.  Has chest congestion.  Reports his pulse oximetry is slightly low at 93%.     Objective:   Physical Exam He reports blood pressure is 138/92, pulse 74 and regular temperature 98.2 degrees weight 225 pounds  He is seen virtually and looks a bit pale.  Seems slightly short of breath and somewhat anxious.  No audible wheezing noted on virtual exam.       Assessment & Plan:  Acute lower respiratory infection-doubt COVID-19 because he has been immunized  Mild hypoxemia-continue to monitor closely  Plan: He is to have a chest x-ray.  He is to be out of work for the remainder of the work week, and a note will be written for him.  Due to wheezing and low O2 saturation he has been placed on prednisone and a short taper 4 tabs day 1, 3 tabs day 2, 2 tabs day 3, 1 tab day 4.  He does have a history of diabetes mellitus.  He will continue to watch his Accu-Cheks carefully.  He will be placed on doxycycline 100 mg twice daily for 10 days.  He is to rest at home and call if symptoms worsen.  He currently does not have insurance  coverage but expects to have some in the very near future.

## 2019-09-18 NOTE — Telephone Encounter (Signed)
Patient would like to be seen has had a sinus infection since Saturday he said one of his coworkers was sick and now he is feeling faigued, sinus drainage and congestion and headache no fever has had both Moderna shots last one was 08/20/19. He would like to be seen I told him it would probably be a virtual visit. Please advise.

## 2019-09-18 NOTE — Telephone Encounter (Signed)
12:30pm?

## 2019-09-18 NOTE — Telephone Encounter (Signed)
Agree with you should be virtual but where on schedule  today?

## 2019-09-21 ENCOUNTER — Other Ambulatory Visit: Payer: Self-pay | Admitting: Internal Medicine

## 2019-09-21 DIAGNOSIS — I4891 Unspecified atrial fibrillation: Secondary | ICD-10-CM

## 2019-09-21 DIAGNOSIS — E1159 Type 2 diabetes mellitus with other circulatory complications: Secondary | ICD-10-CM

## 2019-09-23 ENCOUNTER — Encounter: Payer: Self-pay | Admitting: Internal Medicine

## 2019-09-23 NOTE — Patient Instructions (Signed)
Take prednisone in tapering course over 4 days.  Take doxycycline 100 mg twice daily for 10 days.  Monitor O2 sats at home.  Rest and drink fluids.  Call if symptoms worsen.  Out of work for the remainder of the work week.  Note provided.

## 2019-09-28 MED FILL — IRBESARTAN 300 MG TAB: 300 | 90 days supply | Qty: 90 | Fill #1

## 2019-09-28 MED FILL — glipiZIDE 5 MG TABS: 5 | 90 days supply | Qty: 90 | Fill #1

## 2019-09-28 MED FILL — METOPROLOL SUCCINATE ER 100: 100 | 90 days supply | Qty: 90 | Fill #1

## 2019-09-28 MED FILL — DILTIAZEM 24HR ER 240 MG CA: 240 | 30 days supply | Qty: 30 | Fill #1

## 2019-10-02 ENCOUNTER — Ambulatory Visit: Payer: Self-pay | Admitting: Internal Medicine

## 2019-10-09 MED FILL — traZODone HCL 100 MG TABS: 100 | 30 days supply | Qty: 30 | Fill #3

## 2019-10-09 MED FILL — FLUoxetine HCL 20 MG CAPS: 20 | 30 days supply | Qty: 30 | Fill #3

## 2019-10-10 MED FILL — FUROSEMIDE 40 MG TAB: 40 | 60 days supply | Qty: 60 | Fill #2

## 2019-10-15 ENCOUNTER — Encounter: Payer: Self-pay | Admitting: Internal Medicine

## 2019-10-15 ENCOUNTER — Other Ambulatory Visit: Payer: Self-pay

## 2019-10-15 ENCOUNTER — Ambulatory Visit (INDEPENDENT_AMBULATORY_CARE_PROVIDER_SITE_OTHER): Payer: 59 | Admitting: Internal Medicine

## 2019-10-15 VITALS — BP 102/80 | HR 78 | Ht 70.0 in | Wt 232.0 lb

## 2019-10-15 DIAGNOSIS — Z7901 Long term (current) use of anticoagulants: Secondary | ICD-10-CM

## 2019-10-15 DIAGNOSIS — F419 Anxiety disorder, unspecified: Secondary | ICD-10-CM

## 2019-10-15 DIAGNOSIS — Z87898 Personal history of other specified conditions: Secondary | ICD-10-CM

## 2019-10-15 DIAGNOSIS — Z8679 Personal history of other diseases of the circulatory system: Secondary | ICD-10-CM | POA: Diagnosis not present

## 2019-10-15 DIAGNOSIS — F32A Depression, unspecified: Secondary | ICD-10-CM

## 2019-10-15 DIAGNOSIS — E119 Type 2 diabetes mellitus without complications: Secondary | ICD-10-CM | POA: Diagnosis not present

## 2019-10-15 DIAGNOSIS — I10 Essential (primary) hypertension: Secondary | ICD-10-CM

## 2019-10-15 DIAGNOSIS — F329 Major depressive disorder, single episode, unspecified: Secondary | ICD-10-CM

## 2019-10-15 NOTE — Progress Notes (Signed)
   Subjective:    Patient ID: Kyle Dawson, male    DOB: Nov 07, 1965, 54 y.o.   MRN: [id number]  HPI 54 year old Male seen for follow up on diabetes mellitus. Hgb AIC improved from 9.2% in January to 6.5%.  Compliant with meds and eating right.  Lipid panel not done today.  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  He likes the job.  Now worsening to suit him and it is not too long of a drive.  Reports no issues with alcohol abuse and doing much better.    Review of Systems no new complaints-no chest pain shortness of breath or lower extremity edema.  No palpitations.     Objective:   Physical Exam Blood pressure 102/80 pulse 78 pulse oximetry 97% weight 232 pounds, BMI 33.29  Skin warm and dry.  Nodes none.  Neck is supple without JVD thyromegaly or carotid bruits.  Chest clear to auscultation.  Cardiac exam regular rate and rhythm normal S1 and S2.  No lower extremity pitting edema       Assessment & Plan:  History of paroxysmal atrial fibrillation maintained on chronic anticoagulation therapy with Eliquis  Hyperlipidemia treated with Lipitor-not fasting today  Anxiety depression treated with Prozac and then Xarelto  Hypertension treated with Avapro, Cardizem, metoprolol  Diabetes mellitus treated with glipizide and hemoglobin A1c stable at 6.5%.  Has follow-up with cardiology in early July.  Last admitted with atrial fibrillation January 2021.  Needs health maintenance exam in the near future.  Needs tetanus immunization update as well.

## 2019-10-16 LAB — HEMOGLOBIN A1C
Hgb A1c MFr Bld: 6.5 % of total Hgb — ABNORMAL HIGH (ref <5.7)
Mean Plasma Glucose: 140 (calc)
eAG (mmol/L): 7.7 (calc)

## 2019-10-19 ENCOUNTER — Telehealth: Payer: Self-pay | Admitting: Endocrinology

## 2019-10-19 ENCOUNTER — Ambulatory Visit: Payer: 59 | Admitting: Internal Medicine

## 2019-10-19 DIAGNOSIS — Z23 Encounter for immunization: Secondary | ICD-10-CM | POA: Diagnosis not present

## 2019-10-19 MED ORDER — SILDENAFIL CITRATE 100 MG PO TABS
100.0000 mg | ORAL_TABLET | Freq: Every day | ORAL | 0 refills | Status: DC | PRN
Start: 2019-10-19 — End: 2019-12-11

## 2019-10-19 NOTE — Telephone Encounter (Signed)
Dr. Renold Genta - Per Dr. Ronnie Derby request, I am forwarding this refill request. Please review and refill if appropriate.  Vaughan Basta - Per Dr. Ronnie Derby request, I am routing this message to you for scheduling purposes. He is asking about a lab appt in addition to his follow up appt on 11/14/19.

## 2019-10-19 NOTE — Telephone Encounter (Signed)
Can you please tell me what this med is? I can't tell by these messages.

## 2019-10-19 NOTE — Telephone Encounter (Signed)
Done

## 2019-10-19 NOTE — Telephone Encounter (Signed)
Pt is requesting a refill of Sildenafil.

## 2019-10-19 NOTE — Telephone Encounter (Signed)
Please forward refill request to PCP.  Has he been scheduled for labs also?

## 2019-10-19 NOTE — Telephone Encounter (Signed)
LMTCB to schedule prior labs.

## 2019-10-19 NOTE — Telephone Encounter (Signed)
Per MyChart request -Patient  Requested appointment (appt scheduled for 11/14/19) and a request for a refill of Sildenafil.

## 2019-10-19 NOTE — Telephone Encounter (Signed)
Please advise about this refill request. Did not see this medication on his list of meds.

## 2019-10-19 NOTE — Telephone Encounter (Signed)
Araceli, please refill generic Viagra 100 mg #12 for Kyle Dawson

## 2019-10-22 ENCOUNTER — Ambulatory Visit: Payer: 59 | Admitting: Internal Medicine

## 2019-11-05 ENCOUNTER — Ambulatory Visit: Payer: 59 | Admitting: Internal Medicine

## 2019-11-05 MED FILL — traZODone HCL 100 MG TABS: 100 | 30 days supply | Qty: 30 | Fill #4

## 2019-11-05 MED FILL — FLUoxetine HCL 20 MG CAPS: 20 | 30 days supply | Qty: 30 | Fill #4

## 2019-11-05 NOTE — Progress Notes (Signed)
Was not able to keep appt for Tdap due to car issues.

## 2019-11-07 NOTE — Patient Instructions (Signed)
Needs health maintenance exam.  Needs tetanus immunization update.  Continue current medications.  Return in 3 months.  Has appointment with cardiology in July.  No change in medications.  Stable hemoglobin A1c.  Not fasting today so lipids could not be checked.

## 2019-11-08 ENCOUNTER — Other Ambulatory Visit: Payer: Self-pay | Admitting: Endocrinology

## 2019-11-08 DIAGNOSIS — E782 Mixed hyperlipidemia: Secondary | ICD-10-CM

## 2019-11-08 DIAGNOSIS — E1165 Type 2 diabetes mellitus with hyperglycemia: Secondary | ICD-10-CM

## 2019-11-09 ENCOUNTER — Other Ambulatory Visit: Payer: Self-pay

## 2019-11-14 ENCOUNTER — Ambulatory Visit: Payer: Self-pay | Admitting: Endocrinology

## 2019-11-22 MED FILL — DILTIAZEM 24HR ER 240 MG CA: 240 | 30 days supply | Qty: 30 | Fill #0

## 2019-11-30 ENCOUNTER — Other Ambulatory Visit: Payer: Self-pay

## 2019-12-03 ENCOUNTER — Telehealth: Payer: Self-pay | Admitting: Internal Medicine

## 2019-12-03 NOTE — Telephone Encounter (Signed)
Schedule virtual visit once we have results of Covid test

## 2019-12-03 NOTE — Telephone Encounter (Signed)
[facility] [phone]  Kyle Dawson called to say he has a fever 99.6, he wanted to know if we had rapid COVID test, I let him know we do not, so he is going to go somewhere local and have a rapid test and have results faxed to [place]. He also said he would need doctors note stating when he can return to work, I let him know he would need virtual visit to discuss in order to get a work note, and we would need to results of COVID test.

## 2019-12-04 ENCOUNTER — Telehealth (INDEPENDENT_AMBULATORY_CARE_PROVIDER_SITE_OTHER): Payer: 59 | Admitting: Internal Medicine

## 2019-12-04 ENCOUNTER — Encounter: Payer: Self-pay | Admitting: Internal Medicine

## 2019-12-04 ENCOUNTER — Ambulatory Visit: Payer: Self-pay | Admitting: Endocrinology

## 2019-12-04 ENCOUNTER — Other Ambulatory Visit: Payer: Self-pay

## 2019-12-04 VITALS — BP 128/84 | HR 86 | Temp 98.6°F

## 2019-12-04 DIAGNOSIS — Z20822 Contact with and (suspected) exposure to covid-19: Secondary | ICD-10-CM

## 2019-12-04 DIAGNOSIS — Z87898 Personal history of other specified conditions: Secondary | ICD-10-CM

## 2019-12-04 DIAGNOSIS — R509 Fever, unspecified: Secondary | ICD-10-CM

## 2019-12-04 DIAGNOSIS — I10 Essential (primary) hypertension: Secondary | ICD-10-CM

## 2019-12-04 DIAGNOSIS — R5381 Other malaise: Secondary | ICD-10-CM

## 2019-12-04 DIAGNOSIS — Z8679 Personal history of other diseases of the circulatory system: Secondary | ICD-10-CM | POA: Diagnosis not present

## 2019-12-04 DIAGNOSIS — R5383 Other fatigue: Secondary | ICD-10-CM

## 2019-12-04 DIAGNOSIS — E119 Type 2 diabetes mellitus without complications: Secondary | ICD-10-CM | POA: Diagnosis not present

## 2019-12-04 DIAGNOSIS — Z0289 Encounter for other administrative examinations: Secondary | ICD-10-CM

## 2019-12-04 NOTE — Telephone Encounter (Signed)
scheduled

## 2019-12-04 NOTE — Progress Notes (Signed)
   Subjective:    Patient ID: Kyle Dawson, male    DOB: 11-20-1965, 54 y.o.   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  Patient has had 2 COVID-19 immunizations in March and April of this year.  Patient has a history of alcohol abuse but currently is in recovery.  Employees come to his department seeking advice when they are ill.  Although he wears protective equipment, there is some potential exposure.  Some of these individuals have not been vaccinated for COVID-19.  This past Sunday, July 25, patient developed low-grade fever.  He was quite fatigued.  Says his temperature was 99.6 degrees orally.  He had no myalgias nausea vomiting diarrhea or headache.  No dysgeusia.  He went to an urgent care center and had a rapid COVID-19 test which was negative recently.  Staff at urgent care suggested he be retested in a few days.  Meanwhile, his work is interested in when he may return.  He has been out of work.  Patient is seen by [employer] video telecommunications today because of the coronavirus pandemic.  He is seen in his home.  I am at my office.  He is identified using 2 identifiers as Kyle Dawson.  Kyle Dawson, a patient in this practice.  Patient says that his temperature has decreased but he still feels quite fatigued.  He has been confining himself to his home.  He resides alone.    Review of Systems no nasal congestion or headache- main complaint is extreme fatigue     Objective:   Physical Exam Highest temperature reported 99.6 degrees.  Says he is currently afebrile.  Seen virtually and looks slightly pale and tired but in no acute distress and is able to give a clear concise history.       Assessment & Plan:  COVID-19  exposure with negative rapid COVID-19 test at urgent care  Fatigue and low-grade fever-concerning with recent COVID-19 exposure and delta variant circulating in the community  Diabetes mellitus  History of atrial fib with RVR  Remote history of alcohol abuse  Plan: His work is requesting some direction about how long he needs to be out of work.  I have advised him to come to this office tomorrow and we will do a car test for COVID-19 with PCR testing to [facility].  I would like for him to continue to quarantine for the remainder of the week pending that result and hopefully he will feel better.  Further instructions to follow once we have the PCR test result back.  Patient is agreeable to this plan and will present to the office tomorrow for a COVID-19 PCR test in his car to be performed by me in PPE note provided for him to be out of work until at least August 2 pending COVID-19 test results.

## 2019-12-05 ENCOUNTER — Encounter: Payer: Self-pay | Admitting: Internal Medicine

## 2019-12-05 ENCOUNTER — Ambulatory Visit (INDEPENDENT_AMBULATORY_CARE_PROVIDER_SITE_OTHER): Payer: 59 | Admitting: Internal Medicine

## 2019-12-05 DIAGNOSIS — Z20822 Contact with and (suspected) exposure to covid-19: Secondary | ICD-10-CM

## 2019-12-05 DIAGNOSIS — F1021 Alcohol dependence, in remission: Secondary | ICD-10-CM

## 2019-12-05 NOTE — Patient Instructions (Signed)
Nasal swab obtained for Covid-19 PCR testing by Dr. Renold Genta.

## 2019-12-05 NOTE — Progress Notes (Signed)
   Subjective:    Patient ID: Kyle Dawson, male    DOB: [birth year], 54 y.o.   MRN: [id number]  HPI 55 year old  Male seen in his automobile today for PCR testing for close exposure to Covid-19 virus infection at work. Patient seen virtually by me yesterday. Had rapid test at urgent care center that was negative yesterday. Still has malaise and fatigue. Has had low grade fever.  Out of work until at least Monday August 2nd.    Review of Systems see above     Objective:   Physical Exam  Not examined. Covid 19 PCR speimen obtained from left nostril. Patient in his car. I am out side car in protective equipment.      Assessment & Plan:  Close exposure to Covid-19 virus infection. Patient has been vaccinated but could have delta variant.  Plan: Continue to remain out of work until Monday due to symptoms. Await PCR test results from specimen obtained today.

## 2019-12-05 NOTE — Patient Instructions (Signed)
It was a pleasure to see you by video visit today.  Please continue to quarantine.  Stay well-hydrated.  May take Tylenol for low-grade fever if necessary.  Please come to the office tomorrow for a car visit for COVID-19 PCR testing.  Please continue to be out of work and a note will be provided for you.

## 2019-12-07 LAB — SARS-COV-2 RNA,(COVID-19) QUALITATIVE NAAT: SARS CoV2 RNA: NOT DETECTED

## 2019-12-11 ENCOUNTER — Other Ambulatory Visit: Payer: Self-pay

## 2019-12-11 MED ORDER — SILDENAFIL CITRATE 100 MG PO TABS: 100.0000 mg | ORAL_TABLET | Freq: Every day | ORAL | 0 refills | Status: DC | PRN

## 2019-12-11 MED FILL — SILDENAFIL CITRATE 100 MG T: 100 | 30 days supply | Qty: 12 | Fill #0

## 2019-12-29 MED FILL — JARDIANCE 25 MG TABLET: 25 | 30 days supply | Qty: 30 | Fill #0

## 2019-12-29 MED FILL — METOPROLOL SUCCINATE ER 100: 100 | 90 days supply | Qty: 90 | Fill #2

## 2019-12-29 MED FILL — FLUoxetine HCL 20 MG CAPS: 20 | 30 days supply | Qty: 30 | Fill #5

## 2019-12-29 MED FILL — IRBESARTAN 300 MG TAB: 300 | 90 days supply | Qty: 90 | Fill #2

## 2019-12-29 MED FILL — DILTIAZEM 24HR ER 240 MG CA: 240 | 30 days supply | Qty: 30 | Fill #1

## 2019-12-29 MED FILL — glipiZIDE 5 MG TABS: 5 | 90 days supply | Qty: 90 | Fill #2

## 2020-01-24 MED FILL — JARDIANCE 25 MG TABLET: 25 | 30 days supply | Qty: 30 | Fill #1

## 2020-02-12 ENCOUNTER — Other Ambulatory Visit: Payer: Self-pay | Admitting: Internal Medicine

## 2020-02-12 NOTE — Telephone Encounter (Signed)
Please call him no recent CPE

## 2020-02-13 ENCOUNTER — Other Ambulatory Visit: Payer: Self-pay | Admitting: Internal Medicine

## 2020-02-13 MED FILL — FUROSEMIDE 40 MG TAB: 40 | 60 days supply | Qty: 60 | Fill #0

## 2020-02-13 MED FILL — SILDENAFIL CITRATE 100 MG T: 100 | 30 days supply | Qty: 12 | Fill #0

## 2020-02-13 NOTE — Telephone Encounter (Signed)
CPE has been scheduled in January.

## 2020-02-14 ENCOUNTER — Other Ambulatory Visit: Payer: Self-pay | Admitting: Internal Medicine

## 2020-02-14 MED FILL — DILTIAZEM 24HR ER 240 MG CA: 240 | 30 days supply | Qty: 30 | Fill #0

## 2020-03-10 LAB — HM DIABETES EYE EXAM

## 2020-03-10 MED FILL — DILTIAZEM 24HR ER 240 MG CA: 240 | 30 days supply | Qty: 30 | Fill #1

## 2020-03-10 MED FILL — SILDENAFIL CITRATE 100 MG T: 100 | 30 days supply | Qty: 12 | Fill #1

## 2020-04-01 MED FILL — METOPROLOL SUCCINATE ER 100: 100 | 90 days supply | Qty: 90 | Fill #3

## 2020-04-01 MED FILL — glipiZIDE 5 MG TABS: 5 | 90 days supply | Qty: 90 | Fill #3

## 2020-04-01 MED FILL — IRBESARTAN 300 MG TABS: 300 | 90 days supply | Qty: 90 | Fill #3

## 2020-04-08 MED FILL — FUROSEMIDE 40 MG TAB: 40 | 60 days supply | Qty: 60 | Fill #1

## 2020-04-28 ENCOUNTER — Other Ambulatory Visit: Payer: Self-pay | Admitting: Internal Medicine

## 2020-04-28 MED FILL — SILDENAFIL CITRATE 100 MG T: 100 | 12 days supply | Qty: 12 | Fill #0

## 2020-04-29 ENCOUNTER — Other Ambulatory Visit: Payer: Self-pay | Admitting: *Deleted

## 2020-04-29 ENCOUNTER — Telehealth: Payer: Self-pay | Admitting: Internal Medicine

## 2020-04-29 ENCOUNTER — Other Ambulatory Visit: Payer: Self-pay | Admitting: Internal Medicine

## 2020-04-29 MED ORDER — DILTIAZEM HCL ER COATED BEADS 240 MG PO CP24: 240.0000 mg | ORAL_CAPSULE | Freq: Every day | ORAL | 1 refills | Status: DC

## 2020-04-29 MED FILL — DILTIAZEM 24HR ER 240 MG CA: 240 | 30 days supply | Qty: 30 | Fill #0

## 2020-04-29 NOTE — Telephone Encounter (Signed)
New Message  *STAT* If patient is at the pharmacy, call can be transferred to refill team.  1. Which medications need to be refilled? (please list name of each medication and dose if known) diltiazem (CARDIZEM CD) 240 MG 24 hr capsule  2. Which pharmacy/location (including street and city if local pharmacy) is medication to be sent to? [place], [place]  3. Do they need a 30 day or 90 day supply? [place]

## 2020-05-01 LAB — HM DIABETES EYE EXAM

## 2020-05-16 ENCOUNTER — Other Ambulatory Visit: Payer: Self-pay

## 2020-05-16 ENCOUNTER — Other Ambulatory Visit: Payer: 59 | Admitting: Internal Medicine

## 2020-05-16 DIAGNOSIS — F419 Anxiety disorder, unspecified: Secondary | ICD-10-CM

## 2020-05-16 DIAGNOSIS — F32A Depression, unspecified: Secondary | ICD-10-CM

## 2020-05-16 DIAGNOSIS — R0602 Shortness of breath: Secondary | ICD-10-CM

## 2020-05-16 DIAGNOSIS — E119 Type 2 diabetes mellitus without complications: Secondary | ICD-10-CM

## 2020-05-16 DIAGNOSIS — I10 Essential (primary) hypertension: Secondary | ICD-10-CM

## 2020-05-16 DIAGNOSIS — F1021 Alcohol dependence, in remission: Secondary | ICD-10-CM

## 2020-05-16 DIAGNOSIS — E785 Hyperlipidemia, unspecified: Secondary | ICD-10-CM

## 2020-05-16 DIAGNOSIS — Z87898 Personal history of other specified conditions: Secondary | ICD-10-CM

## 2020-05-16 DIAGNOSIS — Z8679 Personal history of other diseases of the circulatory system: Secondary | ICD-10-CM

## 2020-05-17 LAB — CBC WITH DIFFERENTIAL/PLATELET
Absolute Monocytes: 925 cells/uL (ref 200–950)
Basophils Absolute: 41 cells/uL (ref 0–200)
Basophils Relative: 0.6 %
Eosinophils Absolute: 136 cells/uL (ref 15–500)
Eosinophils Relative: 2 %
HCT: 43.8 % (ref 38.5–50.0)
Hemoglobin: 15.2 g/dL (ref 13.2–17.1)
Lymphs Abs: 1741 cells/uL (ref 850–3900)
MCH: 33 pg (ref 27.0–33.0)
MCHC: 34.7 g/dL (ref 32.0–36.0)
MCV: 95.2 fL (ref 80.0–100.0)
MPV: 10 fL (ref 7.5–12.5)
Monocytes Relative: 13.6 %
Neutro Abs: 3958 cells/uL (ref 1500–7800)
Neutrophils Relative %: 58.2 %
Platelets: 237 10*3/uL (ref 140–400)
RBC: 4.6 10*6/uL (ref 4.20–5.80)
RDW: 12.4 % (ref 11.0–15.0)
Total Lymphocyte: 25.6 %
WBC: 6.8 10*3/uL (ref 3.8–10.8)

## 2020-05-17 LAB — LIPID PANEL
Cholesterol: 175 mg/dL (ref <200)
HDL: 51 mg/dL (ref >=40)
LDL Cholesterol (Calc): 105 mg/dL (calc) — ABNORMAL HIGH
Non-HDL Cholesterol (Calc): 124 mg/dL (calc) (ref <130)
Total CHOL/HDL Ratio: 3.4 (calc) (ref <5.0)
Triglycerides: 96 mg/dL (ref <150)

## 2020-05-17 LAB — COMPLETE METABOLIC PANEL WITH GFR
AG Ratio: 1.6 (calc) (ref 1.0–2.5)
ALT: 21 U/L (ref 9–46)
AST: 16 U/L (ref 10–35)
Albumin: 4.1 g/dL (ref 3.6–5.1)
Alkaline phosphatase (APISO): 44 U/L (ref 35–144)
BUN: 23 mg/dL (ref 7–25)
CO2: 30 mmol/L (ref 20–32)
Calcium: 9.4 mg/dL (ref 8.6–10.3)
Chloride: 98 mmol/L (ref 98–110)
Creat: 1.17 mg/dL (ref 0.70–1.33)
GFR, Est African American: 80 mL/min/{1.73_m2} (ref >=60)
GFR, Est Non African American: 69 mL/min/{1.73_m2} (ref >=60)
Globulin: 2.6 g/dL (calc) (ref 1.9–3.7)
Glucose, Bld: 130 mg/dL — ABNORMAL HIGH (ref 65–99)
Potassium: 4.4 mmol/L (ref 3.5–5.3)
Sodium: 136 mmol/L (ref 135–146)
Total Bilirubin: 0.7 mg/dL (ref 0.2–1.2)
Total Protein: 6.7 g/dL (ref 6.1–8.1)

## 2020-05-17 LAB — HEMOGLOBIN A1C
Hgb A1c MFr Bld: 6.4 % of total Hgb — ABNORMAL HIGH (ref <5.7)
Mean Plasma Glucose: 137 mg/dL
eAG (mmol/L): 7.6 mmol/L

## 2020-05-17 LAB — MICROALBUMIN / CREATININE URINE RATIO
Creatinine, Urine: 141 mg/dL (ref 20–320)
Microalb Creat Ratio: 1 mcg/mg creat (ref <30)
Microalb, Ur: 0.2 mg/dL

## 2020-05-17 LAB — PSA: PSA: 0.78 ng/mL (ref <=4.0)

## 2020-05-19 ENCOUNTER — Other Ambulatory Visit: Payer: Self-pay | Admitting: *Deleted

## 2020-05-19 MED ORDER — APIXABAN 5 MG PO TABS: 5.0000 mg | ORAL_TABLET | Freq: Two times a day (BID) | ORAL | 1 refills | Status: DC

## 2020-05-19 NOTE — Telephone Encounter (Signed)
Eliquis 36m [facility] Patient is 55years old, weight-105.2kg, Crea-1.17 on 05/16/2020, Diagnosis-Afib, and last seen by JDaune Perchon 06/11/2019 and pending an appt 06/13/2020 with Dr. RHarrington Challenger Dose is appropriate based on dosing criteria. Will send in refill to requested pharmacy.

## 2020-05-23 ENCOUNTER — Other Ambulatory Visit: Payer: Self-pay

## 2020-05-23 ENCOUNTER — Other Ambulatory Visit: Payer: Self-pay | Admitting: Internal Medicine

## 2020-05-23 ENCOUNTER — Encounter: Payer: Self-pay | Admitting: Internal Medicine

## 2020-05-23 ENCOUNTER — Ambulatory Visit (INDEPENDENT_AMBULATORY_CARE_PROVIDER_SITE_OTHER): Payer: 59 | Admitting: Internal Medicine

## 2020-05-23 VITALS — BP 120/80 | HR 72 | Temp 98.2°F | Ht 70.0 in | Wt 254.0 lb

## 2020-05-23 DIAGNOSIS — Z0000 Encounter for general adult medical examination without abnormal findings: Secondary | ICD-10-CM | POA: Diagnosis not present

## 2020-05-23 DIAGNOSIS — I491 Atrial premature depolarization: Secondary | ICD-10-CM

## 2020-05-23 DIAGNOSIS — R351 Nocturia: Secondary | ICD-10-CM

## 2020-05-23 DIAGNOSIS — E1169 Type 2 diabetes mellitus with other specified complication: Secondary | ICD-10-CM

## 2020-05-23 DIAGNOSIS — I10 Essential (primary) hypertension: Secondary | ICD-10-CM

## 2020-05-23 DIAGNOSIS — Z7901 Long term (current) use of anticoagulants: Secondary | ICD-10-CM

## 2020-05-23 DIAGNOSIS — E785 Hyperlipidemia, unspecified: Secondary | ICD-10-CM

## 2020-05-23 DIAGNOSIS — N521 Erectile dysfunction due to diseases classified elsewhere: Secondary | ICD-10-CM

## 2020-05-23 DIAGNOSIS — Z8679 Personal history of other diseases of the circulatory system: Secondary | ICD-10-CM

## 2020-05-23 DIAGNOSIS — E119 Type 2 diabetes mellitus without complications: Secondary | ICD-10-CM | POA: Diagnosis not present

## 2020-05-23 DIAGNOSIS — F1021 Alcohol dependence, in remission: Secondary | ICD-10-CM

## 2020-05-23 DIAGNOSIS — R7989 Other specified abnormal findings of blood chemistry: Secondary | ICD-10-CM

## 2020-05-23 LAB — POCT URINALYSIS DIPSTICK
Appearance: NEGATIVE
Bilirubin, UA: NEGATIVE
Blood, UA: NEGATIVE
Glucose, UA: NEGATIVE
Ketones, UA: NEGATIVE
Leukocytes, UA: NEGATIVE
Nitrite, UA: NEGATIVE
Odor: NEGATIVE
Protein, UA: NEGATIVE
Spec Grav, UA: 1.01 (ref 1.010–1.025)
Urobilinogen, UA: 0.2 E.U./dL
pH, UA: 6.5 (ref 5.0–8.0)

## 2020-05-23 MED ORDER — TAMSULOSIN HCL 0.4 MG PO CAPS
0.4000 mg | ORAL_CAPSULE | Freq: Every day | ORAL | 3 refills | Status: DC
Start: 2020-05-23 — End: 2020-05-23

## 2020-05-23 MED ORDER — SILDENAFIL CITRATE 100 MG PO TABS: ORAL_TABLET | ORAL | 1 refills | Status: DC

## 2020-05-23 MED FILL — SILDENAFIL CITRATE 100 MG T: 100 | 30 days supply | Qty: 12 | Fill #0

## 2020-05-23 MED FILL — TAMSULOSIN HCL 0.4 MG CAP: 0.4 | 90 days supply | Qty: 90 | Fill #0

## 2020-05-23 NOTE — Progress Notes (Signed)
Subjective:    Patient ID: Kyle Dawson, male    DOB: 08-Jan-1966, 55 y.o.   MRN: [id number]  HPI 55 year old Male with history of paroxysmal atrial fibrillation seen today for health maintenance exam and evaluation of medical issues.  He also has history of dependent edema, glucose intolerance, obesity and history of alcohol abuse.  Weight was 227 last January and is now 254 pounds.  He has gained 27 pounds in the past year.  BMI is 36.45.  Social history: He is single.  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  There is considerable COVID exposure due to unvaccinated employees.  Patient has had 3 Moderna vaccines.  Does not smoke.  No recent relapses with alcohol.  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  His father was a Pharmacist, hospital.  Patient lived in [place] growing up and also in [place].  He is fluent in [place].  Has never been married.  Has a living girlfriend.  He formally resided in [place] until around 2007.  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  While in the [employer] where he served for 8 years he was in [place] in [place].  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  Says he has abused alcohol and marijuana.  He had a negative Cardiolite study in 2007.  He presented with chest pain in July 2021.  Cardiac evaluation was negative.  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  Family history: Mother with history of hypertension and has had carotid endarterectomy.  Maternal family history includes heart disease and hypertension.  Does not know a lot about father side of the family.  Says they lived to be fairly old.  Review of Systems apparently stopped taking statin medication but just resumed it recently when he saw his  results.  Issues with nocturia and will be started on Flomax.     Objective:   Physical Exam Blood pressure 120/80, pulse 72 regular temperature 98.2 degrees pulse oximetry 96% weight 254 pounds, BMI 36.45  Skin is warm and dry.  No cervical adenopathy.  No thyromegaly.  No carotid bruits.  Chest is clear to auscultation without rales or wheezing.  No JVD.  Cardiac exam: intermittent irreg ectopy.EKG obtained. No ventricular ectopy. PACs noted No pitting edema of LEs.  Brief neurological exam intact.  Diabetic foot exam shows no ulcerations.       Assessment & Plan:  Type 2 diabetes mellitus-followed by Dr. Dwyane Dee and stable with hemoglobin A1c stable at 6.4%  Hx of A-fib with RVR followed by cardiology  Obesity- continue to work on diet and exercise  Dependent edema-wears compression hose at work.  Has furosemide at home as needed  New complaint is nocturia and will be started on Flomax  Essential hypertension treated with metoprolol, Avapro and, hydralazine  History of depression treated with trazodone  Hx ETOH abuse- LFTs are normal and patient currently sober  Hx erectile dysfunction treated with Viagra. PSA is normal  Chronic anticoagulation treated with Eliquis by Dr. Harrington Challenger  History of BPH treated with Flomax  History of erectile dysfunction treated with Viagra  History of A. fib with rapid ventricular response admitted in January 2021 for 3  days after a bout of binge drinking for about a week.  He has been prescribed Eliquis by Cardiology.  He has an appointment for follow-up with Dr. Dorris Carnes February 4    History of PTSD and depression seen by counselor  BMI 36.45- continue diet and exercise efforts

## 2020-05-24 MED FILL — DILTIAZEM 24HR ER 240 MG CA: 240 | 30 days supply | Qty: 30 | Fill #1

## 2020-05-24 MED FILL — HYDRALAZINE HCL 25 MG TABS: 25 | 90 days supply | Qty: 270 | Fill #1

## 2020-05-26 ENCOUNTER — Encounter: Payer: Self-pay | Admitting: Internal Medicine

## 2020-05-26 NOTE — Patient Instructions (Addendum)
It was a pleasure to see you today.  Start Flomax for nocturia.  He will see Cardiology in the near future and continue follow-up of Diabetes with Endocrinology.  Return in 1 year or as needed.

## 2020-05-28 ENCOUNTER — Encounter: Payer: Self-pay | Admitting: Internal Medicine

## 2020-06-03 MED FILL — FUROSEMIDE 40 MG TAB: 40 | 60 days supply | Qty: 60 | Fill #2

## 2020-06-12 ENCOUNTER — Ambulatory Visit: Payer: Self-pay | Admitting: Internal Medicine

## 2020-06-13 ENCOUNTER — Ambulatory Visit: Payer: Self-pay | Admitting: Internal Medicine

## 2020-06-16 MED FILL — SILDENAFIL CITRATE 100 MG T: 100 | 30 days supply | Qty: 12 | Fill #1

## 2020-06-29 NOTE — Progress Notes (Signed)
Cardiology Office Note   Date:  06/30/2020   ID:  SHYLER HOLZMAN, DOB [birth year], MRN [id number]  PCP:  Elby Showers, MD  Cardiologist:   Dorris Carnes, MD   Pt presents for f/u of CAD and PAF      History of Present Illness: Kyle Dawson is a 55 y.o. male with a history of CAD (CCTA 67 to 75% LAD stenosis    Calcium score 838)  PAF, DM, HTN, HL, EtoH   Hosp in warly 2021 with afib with RVR    Had been drinding prior   Echo whoed moderate LVH  Diltiazem added   Started on Eliquis    The pt says he was seen by Dr Renold Genta  EKG done because she thought he was in afib   It showed SR with PAC  The pt notes some fatigue    He has had some LE swelling  Started taking lasix daily   First 20 and then increased to 40 mg   He says this has helped some   Denies CP   No palpitations       Current Meds  Medication Sig  . apixaban (ELIQUIS) 5 MG TABS tablet Take 1 tablet (5 mg total) by mouth 2 (two) times daily.  . furosemide (LASIX) 40 MG tablet TAKE 1 TABLET BY MOUTH DAILY. (Patient taking differently: 40MG DAILY, 2 TABS IF FLUID RETENTION IS BAD)  . glipiZIDE (GLUCOTROL) 5 MG tablet Take 0.5 tablets (2.5 mg total) by mouth 2 (two) times daily before a meal.  . hydrALAZINE (APRESOLINE) 25 MG tablet Take 1 tablet (25 mg total) by mouth 3 (three) times daily as needed. As needed for systolic BP >268, TMH>962  . irbesartan (AVAPRO) 300 MG tablet Take 1 tablet (300 mg total) by mouth daily.  . metoprolol succinate (TOPROL-XL) 100 MG 24 hr tablet Take 1 tablet (100 mg total) by mouth daily. Take with or immediately following a meal.  . Multiple Vitamin (MULTIVITAMIN ADULT PO) Take by mouth.  . Omega-3 Fatty Acids (FISH OIL PO) Take 1 tablet by mouth daily.   Glory Rosebush Delica Lancets 22L MISC USE TO CHECK BLOOD SUGAR ONCE DAILY  . rosuvastatin (CRESTOR) 10 MG tablet Take 1 tablet (10 mg total) by mouth daily.  . sildenafil (VIAGRA) 100 MG tablet TAKE 1 TABLET BY MOUTH DAILY AS NEEDED FOR  ERECTILE DYSFUNCTION.  . tamsulosin (FLOMAX) 0.4 MG CAPS capsule Take 1 capsule (0.4 mg total) by mouth daily.  . traZODone (DESYREL) 100 MG tablet Take 100 mg by mouth as needed for sleep.  . [DISCONTINUED] atorvastatin (LIPITOR) 10 MG tablet Take 10 mg by mouth daily.  . [DISCONTINUED] diltiazem (CARDIZEM CD) 240 MG 24 hr capsule Take 1 capsule (240 mg total) by mouth daily. Patient needs to keep 06/13/2020 appointment with Dr Harrington Challenger for further refills     Allergies:   Oxycodone   Past Medical History:  Diagnosis Date  . Basal cell carcinoma   . Chest pain   . Diabetes mellitus without complication ([place])   . Edema    lower extremity  . Hypertension   . Squamous cell carcinoma    R ear, nose, each side of face    Past Surgical History:  Procedure Laterality Date  . Removal basal cell carcinoma    . Removal squamous cell carcinoma       Social History:  The patient  reports that he has quit smoking. He has never used smokeless  tobacco. He reports current alcohol use. He reports that he does not use drugs.   Family History:  The patient's family history includes CAD in his maternal grandfather; Heart failure in his mother and paternal grandfather; Hypertension in his father and mother; Peripheral vascular disease (age of onset: 75) in his mother.    ROS:  Please see the history of present illness. All other systems are reviewed and  Negative to the above problem except as noted.    PHYSICAL EXAM: VS:  BP 120/70   Pulse 68   Ht 5' 10"  (1.778 m)   Wt 251 lb 12.8 oz (114.2 kg)   SpO2 97%   BMI 36.13 kg/m   YWV:PXTGG 55 yo in no acute distress  HEENT: normal  Neck: no JVD, carotid bruits  Cardiac: RRR with occasional skip  no murmurs,TRiv Le edema  Respiratory:  clear to auscultation bilaterally, normal work of breathing GI: soft, nontender, nondistended, + BS  No hepatomegaly  MS: no deformity   Skin: warm and dry, no rash Neuro:  Strength and sensation are  intact Psych: euthymic mood, full affect   EKG:  EKG is not  ordered today.   ECHO  05/26/19  1. Left ventricular ejection fraction, by visual estimation, is 60 to 65%. The left ventricle has normal function. There is moderately increased left ventricular hypertrophy. 2. Left ventricular diastolic function could not be evaluated. 3. The left ventricle has no regional wall motion abnormalities. 4. Global right ventricle was not well visualized.The right ventricular size is not well visualized. Right vetricular wall thickness was not assessed. 5. Left atrial size was normal. 6. Right atrial size was normal. 7. The mitral valve is grossly normal. No evidence of mitral valve regurgitation. 8. The tricuspid valve is grossly normal. 9. The aortic valve was not well visualized. Aortic valve regurgitation is not visualized. 10. The pulmonic valve was grossly normal. Pulmonic valve regurgitation is not visualized. 11. 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 12. The inferior vena cava is normal in size with <50% respiratory variability, suggesting right atrial pressure of 8 mmHg. 13. The interatrial septum was not well visualized. Lipid Panel    Component Value Date/Time   CHOL 175 05/16/2020 0920   TRIG 96 05/16/2020 0920   HDL 51 05/16/2020 0920   CHOLHDL 3.4 05/16/2020 0920   VLDL 28 05/26/2019 0434   LDLCALC 105 (H) 05/16/2020 0920      Wt Readings from Last 3 Encounters:  06/30/20 251 lb 12.8 oz (114.2 kg)  05/23/20 254 lb (115.2 kg)  10/15/19 232 lb (105.2 kg)      ASSESSMENT AND PLAN:  1  Hx PAF   Continues on diltiazem and Eliquis    Clinically in SR today   I wonder if he is having more afib than he knows   His head does not "sweat" as it did when he first was found to have afib   BUt he has had some dyspnea as well as edeam For now I would set up for an event monitor to document  2  CAD  Pt had CCTA  As noted above    I am not convinced that above SOB / some LE edema represents anginal equivalent  Will follow  3  HTN  Good control  4  HL   LDL needs to be better   I would stop lipitor and start on Crestor 10 mg   F/U lipdis and AST  in 8 wks     Current medicines are reviewed at length with the patient today.  The patient does not have concerns regarding medicines.  Signed, Dorris Carnes, MD  06/30/2020 [facility] [place], [place], [place]  [address] Phone: [phone]; Fax: [phone]

## 2020-06-30 ENCOUNTER — Encounter: Payer: Self-pay | Admitting: *Deleted

## 2020-06-30 ENCOUNTER — Other Ambulatory Visit: Payer: Self-pay

## 2020-06-30 ENCOUNTER — Encounter: Payer: Self-pay | Admitting: Internal Medicine

## 2020-06-30 ENCOUNTER — Other Ambulatory Visit: Payer: Self-pay | Admitting: Internal Medicine

## 2020-06-30 ENCOUNTER — Ambulatory Visit (INDEPENDENT_AMBULATORY_CARE_PROVIDER_SITE_OTHER): Payer: PRIVATE HEALTH INSURANCE | Admitting: Internal Medicine

## 2020-06-30 ENCOUNTER — Ambulatory Visit (INDEPENDENT_AMBULATORY_CARE_PROVIDER_SITE_OTHER): Payer: PRIVATE HEALTH INSURANCE

## 2020-06-30 VITALS — BP 120/70 | HR 68 | Ht 70.0 in | Wt 251.8 lb

## 2020-06-30 DIAGNOSIS — Z79899 Other long term (current) drug therapy: Secondary | ICD-10-CM

## 2020-06-30 DIAGNOSIS — I480 Paroxysmal atrial fibrillation: Secondary | ICD-10-CM

## 2020-06-30 DIAGNOSIS — E785 Hyperlipidemia, unspecified: Secondary | ICD-10-CM | POA: Diagnosis not present

## 2020-06-30 MED ORDER — DILTIAZEM HCL ER COATED BEADS 240 MG PO CP24: 240.0000 mg | ORAL_CAPSULE | Freq: Every day | ORAL | 3 refills | Status: DC

## 2020-06-30 MED ORDER — ROSUVASTATIN CALCIUM 10 MG PO TABS
10.0000 mg | ORAL_TABLET | Freq: Every day | ORAL | 3 refills | Status: DC
Start: 2020-06-30 — End: 2020-06-30

## 2020-06-30 MED FILL — ROSUVASTATIN CALCIUM 10 MG: 10 | 90 days supply | Qty: 90 | Fill #0

## 2020-06-30 MED FILL — DILTIAZEM 24HR ER 240 MG CA: 240 | 90 days supply | Qty: 90 | Fill #0

## 2020-06-30 NOTE — Progress Notes (Signed)
Patient ID: Kyle Dawson, male   DOB: 02-15-1966, 55 y.o.   MRN: [id number] Patient enrolled for [employer] to ship a 14 day ZIO XT to his home.

## 2020-06-30 NOTE — Patient Instructions (Signed)
Medication Instructions:  Your physician has recommended you make the following change in your medication: Stop Atorvastatin. Start Rosuvastatin 10 mg by mouth daily  *If you need a refill on your cardiac medications before your next appointment, please call your pharmacy*   Lab Work: Your physician recommends that you return for lab work on April 18,2022.  This will be fasting--Lipid and AST.  The lab opens at 7:30 AM  If you have labs (blood work) drawn today and your tests are completely normal, you will receive your results only by: Marland Kitchen MyChart Message (if you have MyChart) OR . A paper copy in the mail If you have any lab test that is abnormal or we need to change your treatment, we will call you to review the results.   Testing/Procedures: Your physician has recommended that you wear a holter monitor. Holter monitors are medical devices that record the heart's electrical activity. Doctors most often use these monitors to diagnose arrhythmias. Arrhythmias are problems with the speed or rhythm of the heartbeat. The monitor is a small, portable device. You can wear one while you do your normal daily activities. This is usually used to diagnose what is causing palpitations/syncope (passing out).     Follow-Up: At [facility], you and your health needs are our priority.  As part of our continuing mission to provide you with exceptional heart care, we have created designated Provider Care Teams.  These Care Teams include your primary Cardiologist (physician) and Advanced Practice Providers (APPs -  Physician Assistants and Nurse Practitioners) who all work together to provide you with the care you need, when you need it.  We recommend signing up for the patient portal called "MyChart".  Sign up information is provided on this After Visit Summary.  MyChart is used to connect with patients for Virtual Visits (Telemedicine).  Patients are able to view lab/test results, encounter notes,  upcoming appointments, etc.  Non-urgent messages can be sent to your provider as well.   To learn more about what you can do with MyChart, go to [email or web address].    Your next appointment:   9 month(s)--September  The format for your next appointment:   In Person  Provider:   You may see Dorris Carnes, MD or one of the following Advanced Practice Providers on your designated Care Team:    Richardson Dopp, PA-C  [place]in [place], [place]    Other Instructions [place] Monitor Instructions   Your physician has requested you wear your ZIO patch monitor__14_____days.   This is a single patch monitor.  [employer] supplies one patch monitor per enrollment.  Additional stickers are not available.   Please do not apply patch if you will be having a Nuclear Stress Test, Echocardiogram, Cardiac CT, MRI, or Chest Xray during the time frame you would be wearing the monitor. The patch cannot be worn during these tests.  You cannot remove and re-apply the ZIO XT patch monitor.   Your ZIO patch monitor will be sent USPS Priority mail from [facility] directly to your home address. The monitor may also be mailed to a PO BOX if home delivery is not available.   It may take 3-5 days to receive your monitor after you have been enrolled.   Once you have received you monitor, please review enclosed instructions.  Your monitor has already been registered assigning a specific monitor serial # to you.   Applying the monitor   Shave hair from upper left chest.  Hold abrader disc by orange tab.  Rub abrader in 40 strokes over left upper chest as indicated in your monitor instructions.   Clean area with 4 enclosed alcohol pads .  Use all pads to assure are is cleaned thoroughly.  Let dry.   Apply patch as indicated in monitor instructions.  Patch will be place under collarbone on left side of chest with arrow pointing upward.   Rub patch adhesive wings for 2 minutes.Remove white label marked  "1".  Remove white label marked "2".  Rub patch adhesive wings for 2 additional minutes.   While looking in a mirror, press and release button in center of patch.  A small green light will flash 3-4 times .  This will be your only indicator the monitor has been turned on.     Do not shower for the first 24 hours.  You may shower after the first 24 hours.   Press button if you feel a symptom. You will hear a small click.  Record Date, Time and Symptom in the Patient Log Book.   When you are ready to remove patch, follow instructions on last 2 pages of Patient Log Book.  Stick patch monitor onto last page of Patient Log Book.   Place Patient Log Book in [place] box.  Use locking tab on box and tape box closed securely.  The [employer] and [employer] has [employer] on it.  Please place in mailbox as soon as possible.  Your physician should have your test results approximately 7 days after the monitor has been mailed back to [facility].   Call [place] at [phone] if you have questions regarding your ZIO XT patch monitor.  Call them immediately if you see an orange light blinking on your monitor.   If your monitor falls off in less than 4 days contact our Monitor department at [phone].  If your monitor becomes loose or falls off after 4 days call [employer] at [phone] for suggestions on securing your monitor.

## 2020-07-06 DIAGNOSIS — I480 Paroxysmal atrial fibrillation: Secondary | ICD-10-CM | POA: Diagnosis not present

## 2020-07-22 ENCOUNTER — Telehealth: Payer: Self-pay | Admitting: *Deleted

## 2020-07-22 NOTE — Telephone Encounter (Signed)
Transition Care Management Follow-up Telephone Call  Date of discharge and from where: 07-21-2020  [facility]   How have you been since you were released from the hospital? Feeling better a little tired  Any questions or concerns? no  Items Reviewed:  Did the pt receive and understand the discharge instructions provided? Yes   Medications obtained and verified? Yes   Other? na  Any new allergies since your discharge? No   Dietary orders reviewed? yes  Do you have support at home? Yes   Home Care and Equipment/Supplies:Functional Questionnaire: (I = Independent and D = Dependent) ADLs: I  Bathing/Dressing- I  Meal Prep- I  Eating- I  Maintaining continence- I  Transferring/Ambulation- I  Managing Meds- I  Follow up appointments reviewed:   PCP Hospital f/u appt confirmed? No    Specialist Hospital f/u appt confirmed? No   Patient has left a message to schedule waiting on return call  Are transportation arrangements needed? No   If their condition worsens, is the pt aware to call PCP or go to the Emergency Dept.? yes  Was the patient provided with contact information for the PCP's office or ED? yes  Was to pt encouraged to call back with questions or concerns? yes

## 2020-07-29 ENCOUNTER — Other Ambulatory Visit (HOSPITAL_BASED_OUTPATIENT_CLINIC_OR_DEPARTMENT_OTHER): Payer: Self-pay

## 2020-08-07 ENCOUNTER — Telehealth: Payer: Self-pay | Admitting: Internal Medicine

## 2020-08-07 NOTE — Telephone Encounter (Signed)
Patient on eliquis 5 mg PO BID.

## 2020-08-07 NOTE — Telephone Encounter (Signed)
[place] is Calling to report a critical result

## 2020-08-07 NOTE — Telephone Encounter (Signed)
[place] with [employer] called with end of monitor report.  She reports Afib with a HR of 190 on strip #10.

## 2020-08-13 ENCOUNTER — Telehealth: Payer: Self-pay | Admitting: *Deleted

## 2020-08-13 ENCOUNTER — Other Ambulatory Visit (HOSPITAL_COMMUNITY): Payer: Self-pay

## 2020-08-13 ENCOUNTER — Other Ambulatory Visit: Payer: Self-pay

## 2020-08-13 ENCOUNTER — Other Ambulatory Visit: Payer: Self-pay | Admitting: Internal Medicine

## 2020-08-13 DIAGNOSIS — I480 Paroxysmal atrial fibrillation: Secondary | ICD-10-CM

## 2020-08-13 MED ORDER — METOPROLOL SUCCINATE ER 100 MG PO TB24: 150.0000 mg | ORAL_TABLET | Freq: Every day | ORAL | Status: DC

## 2020-08-13 MED ORDER — GLIPIZIDE 5 MG PO TABS
2.5000 mg | ORAL_TABLET | Freq: Two times a day (BID) | ORAL | 11 refills | Status: DC
  Filled 2020-08-13: qty 90, 90d supply, fill #0

## 2020-08-13 MED ORDER — FUROSEMIDE 40 MG PO TABS
ORAL_TABLET | Freq: Every day | ORAL | 0 refills | Status: DC
  Filled 2020-08-13: qty 90, 90d supply, fill #0

## 2020-08-13 NOTE — Telephone Encounter (Signed)
Spoke with patient. Adv he will be contacted to arrange EP appointment. Referral placed. Adv to increase Toprol Xl to 150 mg daily. He does not want a new prescription at this time, will monitor BP/HR on increased dose.

## 2020-08-13 NOTE — Telephone Encounter (Signed)
-----   Message from Fay Records, MD sent at 08/13/2020  9:50 AM EDT ----- I spoke with pt on phone   I would like him to be set up to see W Camnitz or J Allred for afib with rapid rates   Hx of CAD I did not tell pt but on review of med I would increase toprol to 150 mg   COntinue other meds   FOllow BP and HR Will need something else to keep him out of afib

## 2020-08-14 ENCOUNTER — Other Ambulatory Visit (HOSPITAL_COMMUNITY): Payer: Self-pay

## 2020-08-15 ENCOUNTER — Other Ambulatory Visit: Payer: Self-pay | Admitting: Internal Medicine

## 2020-08-15 ENCOUNTER — Other Ambulatory Visit (HOSPITAL_COMMUNITY): Payer: Self-pay

## 2020-08-15 MED ORDER — SILDENAFIL CITRATE 100 MG PO TABS
ORAL_TABLET | ORAL | 1 refills | Status: DC
  Filled 2020-08-15: qty 12, 12d supply, fill #0
  Filled 2020-09-26: qty 12, 30d supply, fill #1

## 2020-08-19 ENCOUNTER — Other Ambulatory Visit (HOSPITAL_COMMUNITY): Payer: Self-pay

## 2020-08-19 MED FILL — Tamsulosin HCl Cap 0.4 MG: ORAL | 90 days supply | Qty: 90 | Fill #0 | Status: AC

## 2020-08-20 ENCOUNTER — Other Ambulatory Visit (HOSPITAL_COMMUNITY): Payer: Self-pay

## 2020-08-23 MED FILL — Tamsulosin HCl Cap 0.4 MG: ORAL | 90 days supply | Qty: 90 | Fill #1 | Status: AC

## 2020-08-25 ENCOUNTER — Other Ambulatory Visit: Payer: PRIVATE HEALTH INSURANCE

## 2020-08-25 ENCOUNTER — Other Ambulatory Visit (HOSPITAL_COMMUNITY): Payer: Self-pay

## 2020-09-09 ENCOUNTER — Other Ambulatory Visit: Payer: Self-pay

## 2020-09-09 ENCOUNTER — Other Ambulatory Visit (HOSPITAL_COMMUNITY): Payer: Self-pay

## 2020-09-09 ENCOUNTER — Ambulatory Visit (INDEPENDENT_AMBULATORY_CARE_PROVIDER_SITE_OTHER): Payer: PRIVATE HEALTH INSURANCE | Admitting: Cardiology

## 2020-09-09 ENCOUNTER — Encounter: Payer: Self-pay | Admitting: Cardiology

## 2020-09-09 ENCOUNTER — Other Ambulatory Visit: Payer: PRIVATE HEALTH INSURANCE | Admitting: *Deleted

## 2020-09-09 VITALS — BP 128/80 | HR 138 | Ht 70.0 in | Wt 242.6 lb

## 2020-09-09 DIAGNOSIS — I480 Paroxysmal atrial fibrillation: Secondary | ICD-10-CM

## 2020-09-09 DIAGNOSIS — E785 Hyperlipidemia, unspecified: Secondary | ICD-10-CM

## 2020-09-09 DIAGNOSIS — Z79899 Other long term (current) drug therapy: Secondary | ICD-10-CM

## 2020-09-09 LAB — LIPID PANEL
Chol/HDL Ratio: 2.8 ratio (ref 0.0–5.0)
Cholesterol, Total: 145 mg/dL (ref 100–199)
HDL: 51 mg/dL (ref >39)
LDL Chol Calc (NIH): 75 mg/dL (ref 0–99)
Triglycerides: 106 mg/dL (ref 0–149)
VLDL Cholesterol Cal: 19 mg/dL (ref 5–40)

## 2020-09-09 LAB — AST: AST: 21 IU/L (ref 0–40)

## 2020-09-09 MED ORDER — METOPROLOL SUCCINATE ER 100 MG PO TB24
200.0000 mg | ORAL_TABLET | Freq: Every day | ORAL | 2 refills | Status: DC
  Filled 2020-09-09: qty 90, 45d supply, fill #0
  Filled 2020-11-23: qty 90, 45d supply, fill #1

## 2020-09-09 MED ORDER — DRONEDARONE HCL 400 MG PO TABS
400.0000 mg | ORAL_TABLET | Freq: Two times a day (BID) | ORAL | 3 refills | Status: DC
  Filled 2020-09-09 – 2020-09-29 (×2): qty 60, 30d supply, fill #0
  Filled 2020-12-15: qty 60, 30d supply, fill #1
  Filled 2021-01-29: qty 60, 30d supply, fill #2
  Filled 2021-03-30: qty 60, 30d supply, fill #3

## 2020-09-09 NOTE — Progress Notes (Signed)
Electrophysiology Office Note   Date:  09/09/2020   ID:  Kyle Dawson, DOB November 28, 1965, MRN [id number]  PCP:  Elby Showers, MD  Cardiologist:  Harrington Challenger Primary Electrophysiologist:  Kaylor Simenson Meredith Leeds, MD    Chief Complaint: AF   History of Present Illness: Kyle Dawson is a 55 y.o. male who is being seen today for the evaluation of AF at the request of Kyle Records, MD. Presenting today for electrophysiology evaluation.  He has a history significant for coronary artery disease, paroxysmal atrial fibrillation, diabetes, hypertension, hyperlipidemia, alcohol abuse.  He had a hospitalization in 2021 with atrial fibrillation and rapid rates.  He had been drinking drinking prior to that.  He has symptoms of fatigue, lower extremity swelling.    Today, he denies symptoms of palpitations, chest pain, orthopnea, PND, lower extremity edema, claudication, dizziness, presyncope, syncope, bleeding, or neurologic sequela. The patient is tolerating medications without difficulties.  He feels okay despite being in atrial fibrillation.  His main atrial fibrillation symptoms are fatigue and shortness of breath.  At times he feels that he is in atrial fibrillation due to the symptoms, but there also times that he is minimally symptomatic.  He does not get chest pain and has no shortness of breath.  He has continued to drink alcohol, though he has been sober for the past few months.  He understands alcohol is a trigger for his atrial fibrillation.  He is also been trying to lose weight and has lost approximately 10 pounds in the last month and a half.   Past Medical History:  Diagnosis Date  . Basal cell carcinoma   . Chest pain   . Diabetes mellitus without complication ([place])   . Edema    lower extremity  . Hypertension   . Squamous cell carcinoma    R ear, nose, each side of face   Past Surgical History:  Procedure Laterality Date  . Removal basal cell carcinoma    . Removal squamous cell  carcinoma       Current Outpatient Medications  Medication Sig Dispense Refill  . apixaban (ELIQUIS) 5 MG TABS tablet Take 1 tablet (5 mg total) by mouth 2 (two) times daily. 180 tablet 1  . diltiazem (CARDIZEM CD) 240 MG 24 hr capsule TAKE 1 CAPSULE BY MOUTH ONCE A DAY 90 capsule 3  . dronedarone (MULTAQ) 400 MG tablet Take 1 tablet (400 mg total) by mouth 2 (two) times daily with a meal. 60 tablet 3  . furosemide (LASIX) 40 MG tablet TAKE 1 TABLET BY MOUTH DAILY. 90 tablet 0  . glipiZIDE (GLUCOTROL) 5 MG tablet Take 0.5 tablets (2.5 mg total) by mouth 2 (two) times daily before a meal. 60 tablet 11  . irbesartan (AVAPRO) 300 MG tablet Take 1 tablet (300 mg total) by mouth daily. 90 tablet 3  . metoprolol succinate (TOPROL-XL) 100 MG 24 hr tablet Take 2 tablets (200 mg total) by mouth daily. Take with or immediately following a meal. 90 tablet 2  . Multiple Vitamin (MULTIVITAMIN ADULT PO) Take by mouth.    . Omega-3 Fatty Acids (FISH OIL PO) Take 1 tablet by mouth daily.     Glory Rosebush Delica Lancets 54O MISC USE TO CHECK BLOOD SUGAR ONCE DAILY 100 each 1  . rosuvastatin (CRESTOR) 10 MG tablet TAKE 1 TABLET BY MOUTH ONCE A DAY (STOP ATORVASTATIN) 90 tablet 3  . sildenafil (VIAGRA) 100 MG tablet TAKE 1 TABLET BY MOUTH DAILY AS NEEDED  FOR ERECTILE DYSFUNCTION. 12 tablet 1  . tamsulosin (FLOMAX) 0.4 MG CAPS capsule TAKE 1 CAPSULE BY MOUTH DAILY 90 capsule 3  . traZODone (DESYREL) 100 MG tablet Take 100 mg by mouth as needed for sleep.    . hydrALAZINE (APRESOLINE) 25 MG tablet TAKE 1 TABLET BY MOUTH 3 TIMES DAILY AS NEEDED. AS NEEDED FOR SYSTOLIC BP >056, PVX>480 120 tablet 11   No current facility-administered medications for this visit.    Allergies:   Oxycodone   Social History:  The patient  reports that he has quit smoking. He has never used smokeless tobacco. He reports current alcohol use. He reports that he does not use drugs.   Family History:  The patient's family history  includes CAD in his maternal grandfather; Heart failure in his mother and paternal grandfather; Hypertension in his father and mother; Peripheral vascular disease (age of onset: 27) in his mother.    ROS:  Please see the history of present illness.   Otherwise, review of systems is positive for none.   All other systems are reviewed and negative.    PHYSICAL EXAM: VS:  BP 128/80   Pulse (!) 138   Ht 5' 10"  (1.778 m)   Wt 242 lb 9.6 oz (110 kg)   SpO2 98%   BMI 34.81 kg/m  , BMI Body mass index is 34.81 kg/m. GEN: Well nourished, well developed, in no acute distress  HEENT: normal  Neck: no JVD, carotid bruits, or masses Cardiac: Irregular; no murmurs, rubs, or gallops,no edema  Respiratory:  clear to auscultation bilaterally, normal work of breathing GI: soft, nontender, nondistended, + BS MS: no deformity or atrophy  Skin: warm and dry Neuro:  Strength and sensation are intact Psych: euthymic mood, full affect  EKG:  EKG is ordered today. Personal review of the ekg ordered shows atrial fibrillation rate 138  Recent Labs: 05/16/2020: ALT 21; BUN 23; Creat 1.17; Hemoglobin 15.2; Platelets 237; Potassium 4.4; Sodium 136    Lipid Panel     Component Value Date/Time   CHOL 175 05/16/2020 0920   TRIG 96 05/16/2020 0920   HDL 51 05/16/2020 0920   CHOLHDL 3.4 05/16/2020 0920   VLDL 28 05/26/2019 0434   LDLCALC 105 (H) 05/16/2020 0920     Wt Readings from Last 3 Encounters:  09/09/20 242 lb 9.6 oz (110 kg)  06/30/20 251 lb 12.8 oz (114.2 kg)  05/23/20 254 lb (115.2 kg)      Other studies Reviewed: Additional studies/ Dawson that were reviewed today include: TTE 05/26/19  Review of the above Dawson today demonstrates:  1. Left ventricular ejection fraction, by visual estimation, is 60 to  65%. The left ventricle has normal function. There is moderately increased  left ventricular hypertrophy.  2. Left ventricular diastolic function could not be evaluated.  3. The  left ventricle has no regional wall motion abnormalities.  4. Global right ventricle was not well visualized.The right ventricular  size is not well visualized. Right vetricular wall thickness was not  assessed.  5. Left atrial size was normal.  6. Right atrial size was normal.  7. The mitral valve is grossly normal. No evidence of mitral valve  regurgitation.  8. The tricuspid valve is grossly normal.  9. The aortic valve was not well visualized. Aortic valve regurgitation  is not visualized.  10. The pulmonic valve was grossly normal. Pulmonic valve regurgitation is  not visualized.  11. Left partial anomalous pulmonary venous return. (persisent left SVC)  -  dilated coronary sinus.  12. The inferior vena cava is normal in size with <50% respiratory  variability, suggesting right atrial pressure of 8 mmHg.  13. The interatrial septum was not well visualized.   Cardiac monitor 08/11/20 personally reviewed Sinus rhythm and atrial fibrillation   Rates 44 to 226 bpm  Average HR 86 bpm   Predominent rhythm was SR. Occasional PACs, PVCs  4 episodes of NSVT   Longest 7 beats Atrial fibrillation burden: 17^  Rates 78 to 225 bpm   Average HR 135 bpm   Longest episode of afib 25 hours with average HR 126 bpm    1 triggered event for SOB correlated with afib with RVR   Majority of afib not sensed/triggered Events of palpitations correlated with isolated PVCs    NSVT was not sensed.     ASSESSMENT AND PLAN:  1.  Paroxysmal atrial fibrillation: Currently on diltiazem and Eliquis.  CHA2DS2-VASc of at least 2.  Cardiac monitor shows long episodes of atrial fibrillation.  I spoke with him about options of ablation versus medical management.  He does feel that ablation would be beneficial, though he would like to try medical management initially.  We Broughton Eppinger start him on Multaq today.  We did discuss alcohol cessation and the fact that alcohol is a trigger for his atrial fibrillation.  We Barron Vanloan also try  and get him set up for a sleep study as he does snore at night.  His atrial fibrillation is rapid today.  We Aiman Sonn increase his Toprol-XL to 200 mg.  2.  Coronary artery disease: Status post coronary CT.  Planning to manage medically.  No changes.  3.  Hypertension: Currently well controlled  4.  Hyperlipidemia: LDL goal less than 70.  Plan per primary cardiology.  5.  Alcohol abuse: Complete cessation encouraged  6.  Obesity: Diet and exercise encouraged.  He is lost 10 pounds over the last month.  He would like to continue to lose weight to see if this helps with his atrial fibrillation control.  Case discussed with primary cardiology  Current medicines are reviewed at length with the patient today.   The patient does not have concerns regarding his medicines.  The following changes were made today: Start Multaq, Toprol-XL  Labs/ tests ordered today include:  Orders Placed This Encounter  Procedures  . EKG 12-Lead     Disposition:   FU with Jerrod Damiano 3 months  Signed, Yanisa Goodgame Meredith Leeds, MD  09/09/2020 8:53 AM     [facility] [address] [place] [address] [phone] (office) [phone] (fax)

## 2020-09-09 NOTE — Patient Instructions (Signed)
Medication Instructions:  Your physician has recommended you make the following change in your medication:  1. INCREASE Toprol to 200 mg daily 2. START Multaq 400 mg twice daily  *If you need a refill on your cardiac medications before your next appointment, please call your pharmacy*   Lab Work: None ordered   Testing/Procedures: None ordered   Follow-Up: At [facility], you and your health needs are our priority.  As part of our continuing mission to provide you with exceptional heart care, we have created designated Provider Care Teams.  These Care Teams include your primary Cardiologist (physician) and Advanced Practice Providers (APPs -  Physician Assistants and Nurse Practitioners) who all work together to provide you with the care you need, when you need it.  We recommend signing up for the patient portal called "MyChart".  Sign up information is provided on this After Visit Summary.  MyChart is used to connect with patients for Virtual Visits (Telemedicine).  Patients are able to view lab/test results, encounter notes, upcoming appointments, etc.  Non-urgent messages can be sent to your provider as well.   To learn more about what you can do with MyChart, go to [email or web address].    Your physician recommends that you schedule a follow-up appointment in: 1 week for nurse visit EKG (after staring Multaq)  Your next appointment:   3 month(s)  The format for your next appointment:   In Person  Provider:   Allegra Lai, MD    Thank you for choosing [facility]!!   Trinidad Curet, RN [phone]   Other Instructions  [employer] Patient Assistance [employer]:   [phone]

## 2020-09-10 ENCOUNTER — Other Ambulatory Visit: Payer: Self-pay | Admitting: *Deleted

## 2020-09-10 ENCOUNTER — Other Ambulatory Visit (HOSPITAL_COMMUNITY): Payer: Self-pay

## 2020-09-10 ENCOUNTER — Telehealth: Payer: Self-pay

## 2020-09-10 DIAGNOSIS — E785 Hyperlipidemia, unspecified: Secondary | ICD-10-CM

## 2020-09-10 MED ORDER — ROSUVASTATIN CALCIUM 20 MG PO TABS
20.0000 mg | ORAL_TABLET | Freq: Every day | ORAL | 3 refills | Status: DC
  Filled 2020-09-10: qty 90, 90d supply, fill #0

## 2020-09-10 NOTE — Telephone Encounter (Addendum)
**Note De-Identified Chanise Habeck Obfuscation** Per a [facility] message from the pt I attempted to do a Multaq tier exception through covermymeds but the pts plan does not allow tier exceptions to be done through [facility].  I called the pts plan Maxor and s/w Aleida who advised me that a tier exception is not allowed for Multaq as the price is set by the plan and is a name brand medication.  Aleida recommended that the pt apply for assistance for his Multaq through [employer].  I have advised the pt of this out come through his [facility] message and provided him with [employer] Patient [place] phone number so he can call them with questions and to request a pt asst application be mailed to his home if it appears he would be elilable for approval.  Please see Patient Message in the pts chart from 5/3 for details.

## 2020-09-15 ENCOUNTER — Encounter: Payer: Self-pay | Admitting: Internal Medicine

## 2020-09-15 ENCOUNTER — Ambulatory Visit: Payer: 59 | Admitting: Internal Medicine

## 2020-09-15 ENCOUNTER — Other Ambulatory Visit: Payer: Self-pay

## 2020-09-15 ENCOUNTER — Ambulatory Visit (INDEPENDENT_AMBULATORY_CARE_PROVIDER_SITE_OTHER): Payer: 59 | Admitting: Internal Medicine

## 2020-09-15 ENCOUNTER — Other Ambulatory Visit (HOSPITAL_COMMUNITY): Payer: Self-pay

## 2020-09-15 VITALS — HR 124 | Temp 99.1°F

## 2020-09-15 DIAGNOSIS — R059 Cough, unspecified: Secondary | ICD-10-CM | POA: Diagnosis not present

## 2020-09-15 DIAGNOSIS — Z8679 Personal history of other diseases of the circulatory system: Secondary | ICD-10-CM | POA: Diagnosis not present

## 2020-09-15 DIAGNOSIS — Z7901 Long term (current) use of anticoagulants: Secondary | ICD-10-CM | POA: Diagnosis not present

## 2020-09-15 DIAGNOSIS — J22 Unspecified acute lower respiratory infection: Secondary | ICD-10-CM

## 2020-09-15 DIAGNOSIS — E119 Type 2 diabetes mellitus without complications: Secondary | ICD-10-CM

## 2020-09-15 MED ORDER — DOXYCYCLINE HYCLATE 100 MG PO TABS
100.0000 mg | ORAL_TABLET | Freq: Two times a day (BID) | ORAL | 0 refills | Status: DC
  Filled 2020-09-15: qty 20, 10d supply, fill #0

## 2020-09-15 MED ORDER — PREDNISONE 10 MG PO TABS
ORAL_TABLET | ORAL | 0 refills | Status: DC
  Filled 2020-09-15: qty 21, 6d supply, fill #0

## 2020-09-15 NOTE — Progress Notes (Signed)
   Subjective:    Patient ID: Kyle Dawson, male    DOB: [birth year], 55 y.o.   MRN: [id number]  HPI  55 year old Male seen today for coryza, dry cough, and sneezing for 3 weeks. Flew to [place] to visit family and left ear stopped up with flying and has not returned to normal. Now has productive cough and sputum is now discolored. Some SOB. Patient worried about pneumonia.  He had health maintenance exam in January 2022.  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  He has had 3 Moderna vaccines.  He has a history of paroxysmal atrial fibrillation, glucose intolerance, hypertension, dependent edema.  He is followed by cardiology.  He is on Eliquis.    Review of Systems no nausea vomiting or headache     Objective:   Physical Exam Pulses 124,  temperature 99.1 degrees, pulse oximetry 97%  TMs are clear.  Chest is clear to auscultation.  Cardiac exam: frequent irregular contractions. I think he is back in atrial fib.       Assessment & Plan:  Acute lower respiratory infection-rule out COVID-19.  PCR test obtained by nasal swab and proved to be negative  History of atrial fibrillation I think he is currently in atrial fib.  He is on anti- coagulation medication.  He will monitor his heart rate.  History of Diabetes mellitus  Rocephin 1 g IM given in office.  Doxycycline 100 mg twice daily for 10 days.  Out of work until [employer] are back. Stay hydrated. Rest. Monitor pulse ox.  Take prednisone in tapering course as directed.  Watch Accu-Cheks.

## 2020-09-16 ENCOUNTER — Other Ambulatory Visit (HOSPITAL_COMMUNITY): Payer: Self-pay

## 2020-09-16 LAB — SARS-COV-2 RNA,(COVID-19) QUALITATIVE NAAT: SARS CoV2 RNA: NOT DETECTED

## 2020-09-19 ENCOUNTER — Ambulatory Visit: Payer: PRIVATE HEALTH INSURANCE

## 2020-09-26 ENCOUNTER — Other Ambulatory Visit (HOSPITAL_COMMUNITY): Payer: Self-pay

## 2020-09-26 MED FILL — Diltiazem HCl Coated Beads Cap ER 24HR 240 MG: ORAL | 90 days supply | Qty: 90 | Fill #0 | Status: AC

## 2020-09-29 ENCOUNTER — Other Ambulatory Visit (HOSPITAL_COMMUNITY): Payer: Self-pay

## 2020-09-30 ENCOUNTER — Other Ambulatory Visit (HOSPITAL_COMMUNITY): Payer: Self-pay

## 2020-09-30 ENCOUNTER — Other Ambulatory Visit: Payer: Self-pay

## 2020-09-30 ENCOUNTER — Ambulatory Visit (INDEPENDENT_AMBULATORY_CARE_PROVIDER_SITE_OTHER): Payer: 59 | Admitting: Internal Medicine

## 2020-09-30 VITALS — BP 150/90 | HR 127 | Temp 97.7°F | Ht 70.0 in | Wt 246.0 lb

## 2020-09-30 DIAGNOSIS — H9202 Otalgia, left ear: Secondary | ICD-10-CM | POA: Diagnosis not present

## 2020-09-30 DIAGNOSIS — E119 Type 2 diabetes mellitus without complications: Secondary | ICD-10-CM | POA: Diagnosis not present

## 2020-09-30 DIAGNOSIS — H6692 Otitis media, unspecified, left ear: Secondary | ICD-10-CM | POA: Diagnosis not present

## 2020-09-30 DIAGNOSIS — I4891 Unspecified atrial fibrillation: Secondary | ICD-10-CM | POA: Diagnosis not present

## 2020-09-30 MED ORDER — CEFTRIAXONE SODIUM 1 G IJ SOLR
1.0000 g | Freq: Once | INTRAMUSCULAR | Status: AC
  Administered 2020-09-30: 1 g via INTRAMUSCULAR

## 2020-09-30 MED ORDER — METHYLPREDNISOLONE ACETATE 80 MG/ML IJ SUSP
80.0000 mg | Freq: Once | INTRAMUSCULAR | Status: AC
Start: 2020-09-30 — End: 2020-09-30
  Administered 2020-09-30: 80 mg via INTRAMUSCULAR

## 2020-09-30 MED ORDER — LEVOFLOXACIN 500 MG PO TABS
500.0000 mg | ORAL_TABLET | Freq: Every day | ORAL | 0 refills | Status: DC
  Filled 2020-09-30: qty 10, 10d supply, fill #0

## 2020-09-30 MED ORDER — SULFAMETHOXAZOLE-TRIMETHOPRIM 800-160 MG PO TABS
1.0000 | ORAL_TABLET | Freq: Two times a day (BID) | ORAL | 0 refills | Status: DC
  Filled 2020-09-30: qty 20, 10d supply, fill #0

## 2020-09-30 NOTE — Progress Notes (Signed)
   Subjective:    Patient ID: Kyle Dawson, male    DOB: [birth year], 55 y.o.   MRN: [id number]  HPI 55 year old Male with history of coronary artery disease, diabetes mellitus, hypertension, hyperlipidemia, alcohol abuse and atrial fibrillation followed by [facility].  Here today with complaint of ear pressure.  He was seen in May 9 with coryza, dry cough and sneezing for 3 weeks.  He had flown to [place] to visit family and his left ear stopped up with flying and had not returned to normal.  COVID test was negative.  He was treated with doxycycline as well as 1 g IM Rocephin.  He was also given a tapering course of prednisone when seen on May 9.  He sent My chart message on May 23 with persistent left ear congestion.  Also complained of persistent cough and drainage.    Review of Systems see above denies fever chills nausea or vomiting     Objective:   Physical Exam Blood pressure 150/90 pulse 127 and irregular temperature 97.7 degrees pulse oximetry 97% BMI 35.30  Left TM is full but not red.  Right TM clear.  Pharynx is clear.  Neck is supple.  Chest clear.  Cardiac exam: Irregular irregular rhythm.  I think he is in atrial fibrillation with RVR and he should contact Cardiology       Assessment & Plan:  Persistent left otitis media  Atrial fibrillation with RVR-patient should contact his Cardiologist.  Plan: He was given Depo-Medrol 80 mg IM and 1 g IM Rocephin for otitis media.  Also treated with Levaquin 500 mg daily for 10 days.  He is to contact Cardiology regarding dysrhythmia

## 2020-10-01 ENCOUNTER — Telehealth (HOSPITAL_COMMUNITY): Payer: Self-pay | Admitting: Nurse Practitioner

## 2020-10-01 NOTE — Telephone Encounter (Signed)
Spoke with patient and he is agreeable to appt 10/03/20 at 9 am with Roderic Palau, NP.

## 2020-10-01 NOTE — Telephone Encounter (Signed)
Called and left message for patient to call back to schedule appt with [facility].

## 2020-10-03 ENCOUNTER — Ambulatory Visit (HOSPITAL_COMMUNITY): Payer: PRIVATE HEALTH INSURANCE | Admitting: Nurse Practitioner

## 2020-10-06 ENCOUNTER — Encounter: Payer: Self-pay | Admitting: Internal Medicine

## 2020-10-06 NOTE — Patient Instructions (Addendum)
Rocephin 1 g IM.  Doxycycline 100 mg twice daily for 10 days.  Out of work until COVID-19 test result is back. Rest and stay hydrated. Monitor pulse ox.  Take prednisone in tapering course as directed.  Watch Accu-Cheks.

## 2020-10-09 ENCOUNTER — Encounter (HOSPITAL_COMMUNITY): Payer: Self-pay | Admitting: Physician Assistant

## 2020-10-09 ENCOUNTER — Other Ambulatory Visit: Payer: Self-pay

## 2020-10-09 ENCOUNTER — Ambulatory Visit (HOSPITAL_COMMUNITY)
Admission: RE | Admit: 2020-10-09 | Discharge: 2020-10-09 | Disposition: A | Discharge status: Home / Self Care | Payer: PRIVATE HEALTH INSURANCE | Source: Ambulatory Visit | Attending: Nurse Practitioner | Admitting: Nurse Practitioner

## 2020-10-09 VITALS — BP 128/72 | HR 114 | Ht 70.0 in | Wt 244.4 lb

## 2020-10-09 DIAGNOSIS — D6869 Other thrombophilia: Secondary | ICD-10-CM | POA: Insufficient documentation

## 2020-10-09 DIAGNOSIS — Z79899 Other long term (current) drug therapy: Secondary | ICD-10-CM | POA: Insufficient documentation

## 2020-10-09 DIAGNOSIS — I4819 Other persistent atrial fibrillation: Secondary | ICD-10-CM

## 2020-10-09 DIAGNOSIS — Z6835 Body mass index (BMI) 35.0-35.9, adult: Secondary | ICD-10-CM | POA: Diagnosis not present

## 2020-10-09 DIAGNOSIS — Z7901 Long term (current) use of anticoagulants: Secondary | ICD-10-CM | POA: Diagnosis not present

## 2020-10-09 DIAGNOSIS — I2510 Atherosclerotic heart disease of native coronary artery without angina pectoris: Secondary | ICD-10-CM | POA: Diagnosis not present

## 2020-10-09 DIAGNOSIS — R0683 Snoring: Secondary | ICD-10-CM | POA: Diagnosis not present

## 2020-10-09 DIAGNOSIS — E669 Obesity, unspecified: Secondary | ICD-10-CM | POA: Diagnosis not present

## 2020-10-09 DIAGNOSIS — I10 Essential (primary) hypertension: Secondary | ICD-10-CM | POA: Insufficient documentation

## 2020-10-09 DIAGNOSIS — E785 Hyperlipidemia, unspecified: Secondary | ICD-10-CM | POA: Diagnosis not present

## 2020-10-09 NOTE — Progress Notes (Signed)
Primary Care Physician: Elby Showers, MD Primary Cardiologist: Dr Harrington Challenger Primary Electrophysiologist: Dr Curt Bears Referring Physician: Dr Georgena Spurling Kyle Dawson is a 55 y.o. male with a history of CAD, DM, HTN, HLD, alcohol abuse, atrial fibrillation who presents for follow up in the [facility]. He had a hospitalization in 2021 with atrial fibrillation and rapid rates.  He had been drinking drinking prior to that. Patient is on Eliquis for a CHADS2VASC score of 3. He was seen by Dr Curt Bears on 09/09/20 and was in rapid afib. His metoprolol was increased and he was started on Multaq. He is mostly asymptomatic with his afib. His heart rate has been better controlled 100-110 bpm. He denies any bleeding issues on anticoagulation.   Today, he denies symptoms of palpitations, chest pain, shortness of breath, orthopnea, PND, lower extremity edema, dizziness, presyncope, syncope, snoring, daytime somnolence, bleeding, or neurologic sequela. The patient is tolerating medications without difficulties and is otherwise without complaint today.    Atrial Fibrillation Risk Factors:  he does have symptoms or diagnosis of sleep apnea. Sleep study ordered per Dr Curt Bears he does not have a history of rheumatic fever. he does have a history of alcohol use.   he has a BMI of Body mass index is 35.07 kg/m.Marland Kitchen Filed Weights   10/09/20 1027  Weight: 110.9 kg    Family History  Problem Relation Age of Onset  . Peripheral vascular disease Mother 46  . Hypertension Mother   . Heart failure Mother   . CAD Maternal Grandfather   . Heart failure Paternal Grandfather   . Hypertension Father      Atrial Fibrillation Management history:  Previous antiarrhythmic drugs: Multaq Previous cardioversions: none Previous ablations: none CHADS2VASC score: 3 Anticoagulation history: Eliquis   Past Medical History:  Diagnosis Date  . Basal cell carcinoma   . Chest pain   .  Diabetes mellitus without complication ([address])   . Edema    lower extremity  . Hypertension   . Squamous cell carcinoma    R ear, nose, each side of face   Past Surgical History:  Procedure Laterality Date  . Removal basal cell carcinoma    . Removal squamous cell carcinoma      Current Outpatient Medications  Medication Sig Dispense Refill  . apixaban (ELIQUIS) 5 MG TABS tablet Take 1 tablet (5 mg total) by mouth 2 (two) times daily. 180 tablet 1  . diltiazem (CARDIZEM CD) 240 MG 24 hr capsule TAKE 1 CAPSULE BY MOUTH ONCE A DAY 90 capsule 3  . dronedarone (MULTAQ) 400 MG tablet Take 1 tablet (400 mg total) by mouth 2 (two) times daily with a meal. 60 tablet 3  . furosemide (LASIX) 40 MG tablet TAKE 1 TABLET BY MOUTH DAILY. 90 tablet 0  . glipiZIDE (GLUCOTROL) 5 MG tablet Take 0.5 tablets (2.5 mg total) by mouth 2 (two) times daily before a meal. 60 tablet 11  . hydrALAZINE (APRESOLINE) 25 MG tablet TAKE 1 TABLET BY MOUTH 3 TIMES DAILY AS NEEDED. AS NEEDED FOR SYSTOLIC BP >433, IRJ>188 120 tablet 11  . irbesartan (AVAPRO) 300 MG tablet Take 1 tablet (300 mg total) by mouth daily. 90 tablet 3  . metoprolol succinate (TOPROL-XL) 100 MG 24 hr tablet Take 2 tablets (200 mg total) by mouth daily. Take with or immediately following a meal. 90 tablet 2  . Multiple Vitamin (MULTIVITAMIN ADULT PO) Take by mouth.    . Omega-3 Fatty  Acids (FISH OIL PO) Take 1 tablet by mouth daily.     Glory Rosebush Delica Lancets 44R MISC USE TO CHECK BLOOD SUGAR ONCE DAILY 100 each 1  . rosuvastatin (CRESTOR) 20 MG tablet Take 1 tablet (20 mg total) by mouth daily. 90 tablet 3  . sildenafil (VIAGRA) 100 MG tablet TAKE 1 TABLET BY MOUTH DAILY AS NEEDED FOR ERECTILE DYSFUNCTION. 12 tablet 1  . sulfamethoxazole-trimethoprim (BACTRIM DS) 800-160 MG tablet Take 1 tablet by mouth 2 (two) times daily. 20 tablet 0  . tamsulosin (FLOMAX) 0.4 MG CAPS capsule TAKE 1 CAPSULE BY MOUTH DAILY 90 capsule 3   No current  facility-administered medications for this encounter.    Allergies  Allergen Reactions  . Oxycodone Anxiety and Other (See Comments)    "I drank it with beer and it made me hyper"    Social History   Socioeconomic History  . Marital status: Significant Other    Spouse name: Not on file  . Number of children: 0  . Years of education: ADN  . Highest education level: Not on file  Occupational History  . Occupation: ER [employer]    Employer: [employer]  . Occupation: [employer]  Tobacco Use  . Smoking status: Never Smoker  . Smokeless tobacco: Never Used  Vaping Use  . Vaping Use: Never used  Substance and Sexual Activity  . Alcohol use: Yes    Alcohol/week: 1.0 standard drink    Types: 1 Shots of liquor per week  . Drug use: No  . Sexual activity: Not on file  Other Topics Concern  . Not on file  Social History Narrative   Patient drinks 1 cup of coffee a day    Social Determinants of Health   Financial Resource Strain: Not on file  Food Insecurity: Not on file  Transportation Needs: Not on file  Physical Activity: Not on file  Stress: Not on file  Social Connections: Not on file  Intimate Partner Violence: Not on file     ROS- All systems are reviewed and negative except as per the HPI above.  Physical Exam: Vitals:   10/09/20 1027  BP: 128/72  Pulse: (!) 114  Weight: 110.9 kg  Height: 5' 10"  (1.778 m)    GEN- The patient is a well appearing obese male, alert and oriented x 3 today.   Head- normocephalic, atraumatic Eyes-  Sclera clear, conjunctiva pink Ears- hearing intact Oropharynx- clear Neck- supple  Lungs- Clear to ausculation bilaterally, normal work of breathing Heart- irregular rate and rhythm, no murmurs, rubs or gallops  GI- soft, NT, ND, + BS Extremities- no clubbing, cyanosis, or edema MS- no significant deformity or atrophy Skin- no rash or lesion Psych- euthymic mood, full affect Neuro- strength and sensation are intact  Wt Readings from  Last 3 Encounters:  10/09/20 110.9 kg  09/30/20 111.6 kg  09/09/20 110 kg    EKG today demonstrates  Afib Vent. rate 114 BPM PR interval * ms QRS duration 90 ms QT/QTcB 352/485 ms  Echo 05/26/19 demonstrated  1. Left ventricular ejection fraction, by visual estimation, is 60 to  65%. The left ventricle has normal function. There is moderately increased left ventricular hypertrophy.  2. Left ventricular diastolic function could not be evaluated.  3. The left ventricle has no regional wall motion abnormalities.  4. Global right ventricle was not well visualized.The right ventricular  size is not well visualized. Right vetricular wall thickness was not  assessed.  5. Left atrial  size was normal.  6. Right atrial size was normal.  7. The mitral valve is grossly normal. No evidence of mitral valve  regurgitation.  8. The tricuspid valve is grossly normal.  9. The aortic valve was not well visualized. Aortic valve regurgitation  is not visualized.  10. The pulmonic valve was grossly normal. Pulmonic valve regurgitation is  not visualized.  11. 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  12. The inferior vena cava is normal in size with <50% respiratory  variability, suggesting right atrial pressure of 8 mmHg.  13. The interatrial septum was not well visualized.   Epic records are reviewed at length today  CHA2DS2-VASc Score = 3  The patient's score is based upon: CHF History: No HTN History: Yes Diabetes History: Yes Stroke History: No Vascular Disease History: Yes Age Score: 0 Gender Score: 0      ASSESSMENT AND PLAN: 1. Persistent Atrial Fibrillation (ICD10:  I48.19) The patient's CHA2DS2-VASc score is 3, indicating a 3.2% annual risk of stroke.   Patient remains in afib with rapid rates at times. We discussed therapeutic options today. Will arrange for DCCV once his work schedule allows. Will check bmet/cbc  prior to procedure.  Continue Eliquis 5 mg BID Continue diltiazem 240 mg daily Continue Toprol 200 mg daily Continue Multaq 400 mg BID  2. Secondary Hypercoagulable State (ICD10:  D68.69) The patient is at significant risk for stroke/thromboembolism based upon his CHA2DS2-VASc Score of 3.  Continue Apixaban (Eliquis).   3. Obesity Body mass index is 35.07 kg/m. Lifestyle modification was discussed at length including regular exercise and weight reduction.  4. Snoring/suspected OSA The importance of adequate treatment of sleep apnea was discussed today in order to improve our ability to maintain sinus rhythm long term. Sleep study per Dr Curt Bears.  5. CAD No anginal symptoms.  6. HTN Stable, no changes today.   Follow up in AF clinic for pre DCCV labs and instructions.    [facility] [address] [place], [place] [address] [phone] 10/09/2020 11:42 AM

## 2020-10-10 ENCOUNTER — Other Ambulatory Visit (HOSPITAL_COMMUNITY): Payer: Self-pay | Admitting: *Deleted

## 2020-10-28 ENCOUNTER — Telehealth (HOSPITAL_COMMUNITY): Payer: Self-pay | Admitting: *Deleted

## 2020-10-28 ENCOUNTER — Other Ambulatory Visit (HOSPITAL_COMMUNITY): Payer: PRIVATE HEALTH INSURANCE | Admitting: Physician Assistant

## 2020-10-28 NOTE — Telephone Encounter (Signed)
Patient was scheduled for cardioversion on 6/24. Pt self canceled appt as he has conflicts with work and will call back to reschedule his procedure.

## 2020-10-31 ENCOUNTER — Encounter (HOSPITAL_COMMUNITY): Admission: RE | Payer: Self-pay | Source: Home / Self Care

## 2020-10-31 ENCOUNTER — Ambulatory Visit (HOSPITAL_COMMUNITY)
Admission: RE | Admit: 2020-10-31 | Payer: PRIVATE HEALTH INSURANCE | Source: Home / Self Care | Admitting: Cardiovascular Disease

## 2020-10-31 SURGERY — CARDIOVERSION
Anesthesia: General

## 2020-11-03 ENCOUNTER — Other Ambulatory Visit: Payer: Self-pay | Admitting: Cardiology

## 2020-11-03 ENCOUNTER — Other Ambulatory Visit (HOSPITAL_COMMUNITY): Payer: Self-pay

## 2020-11-03 ENCOUNTER — Other Ambulatory Visit: Payer: Self-pay | Admitting: Internal Medicine

## 2020-11-03 MED ORDER — FUROSEMIDE 40 MG PO TABS
ORAL_TABLET | Freq: Every day | ORAL | 0 refills | Status: DC
  Filled 2020-11-03: qty 90, 90d supply, fill #0

## 2020-11-05 ENCOUNTER — Other Ambulatory Visit (HOSPITAL_COMMUNITY): Payer: Self-pay

## 2020-11-05 ENCOUNTER — Other Ambulatory Visit: Payer: Self-pay | Admitting: Internal Medicine

## 2020-11-11 ENCOUNTER — Other Ambulatory Visit: Payer: Self-pay | Admitting: Internal Medicine

## 2020-11-11 ENCOUNTER — Other Ambulatory Visit (HOSPITAL_COMMUNITY): Payer: Self-pay

## 2020-11-11 MED ORDER — IRBESARTAN 300 MG PO TABS
300.0000 mg | ORAL_TABLET | Freq: Every day | ORAL | 3 refills | Status: DC
  Filled 2020-11-11: qty 90, 90d supply, fill #0

## 2020-11-12 ENCOUNTER — Other Ambulatory Visit (HOSPITAL_COMMUNITY): Payer: Self-pay

## 2020-11-13 ENCOUNTER — Other Ambulatory Visit (HOSPITAL_COMMUNITY): Payer: Self-pay

## 2020-11-15 ENCOUNTER — Encounter: Payer: Self-pay | Admitting: Internal Medicine

## 2020-11-15 NOTE — Patient Instructions (Signed)
Please call Cardiology regarding atrial fib with RVR.  Given Depo-Medrol 80 mg IM and 1 g IM Rocephin.  Also prescribed Levaquin 500 mg daily for 10 days.

## 2020-11-22 IMAGING — CT CT ANGIOGRAPHY CHEST
2 of 8 series · 18 of 46 positions shown · IV contrast (omnipaque)
Comparison: CT chest dated 11/29/2017

CLINICAL DATA: Elevated heart rate.  Chest pain.  Concern for PE.

EXAM:
CT ANGIOGRAPHY CHEST WITH CONTRAST
TECHNIQUE: Multidetector CT imaging of the chest was performed using the
standard protocol during bolus administration of intravenous
contrast. Multiplanar CT image reconstructions and MIPs were
obtained to evaluate the vascular anatomy.
CONTRAST:  100mL OMNIPAQUE IOHEXOL 350 MG/ML SOLN

[Series 6: thins · axial · 0.88mm/px · z∈[+1168,+1454]mm · 15 of 316 slices shown]
[im 15/316  lung]
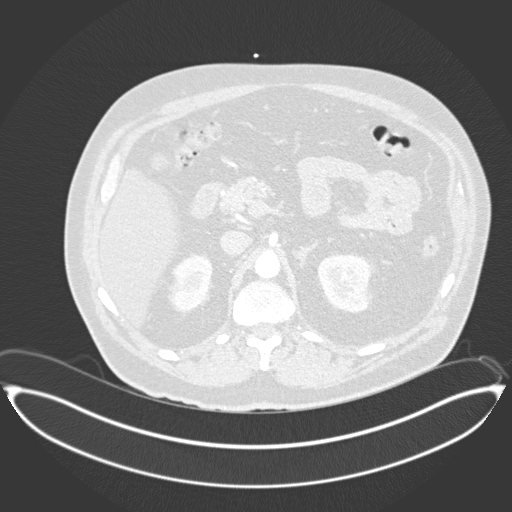
[im 43/316  soft-tissue]
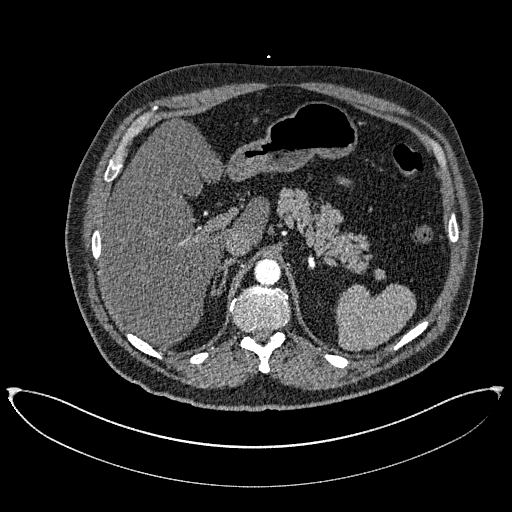
[im 58/316  lung]
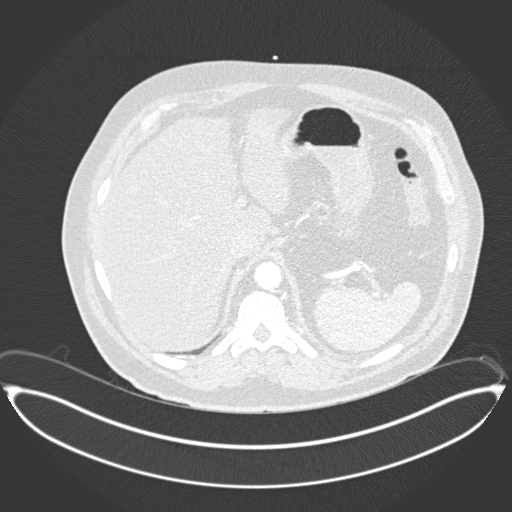
[im 72/316  soft-tissue]
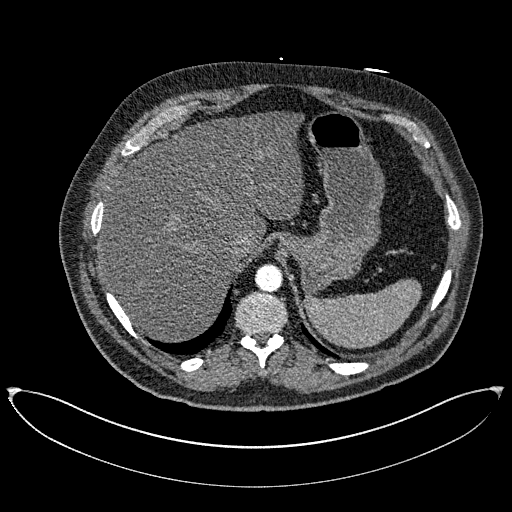
[im 101/316  lung]
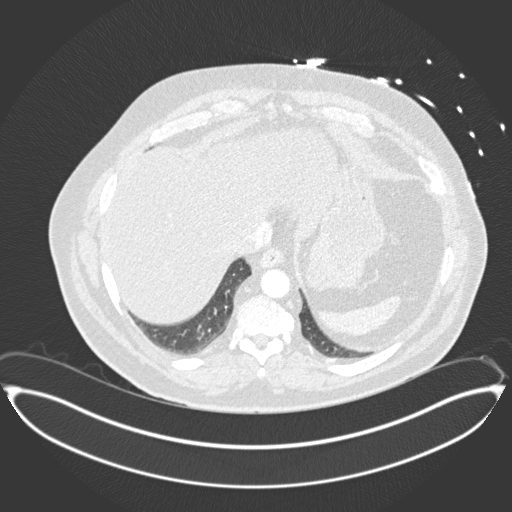
[im 115/316  soft-tissue]
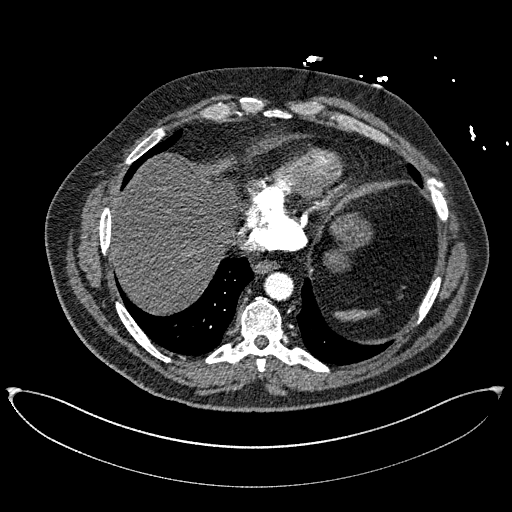
[im 144/316  lung]
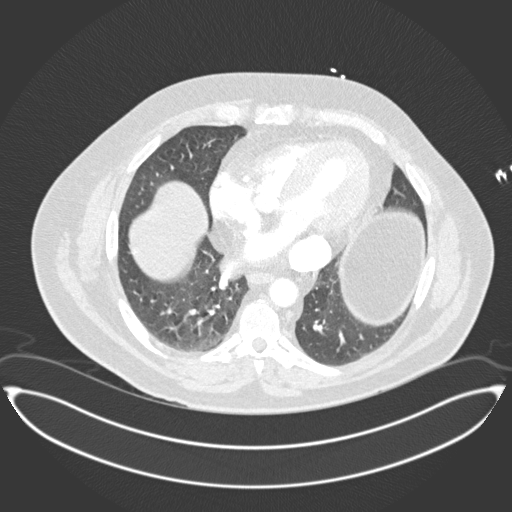
[im 158/316  soft-tissue]
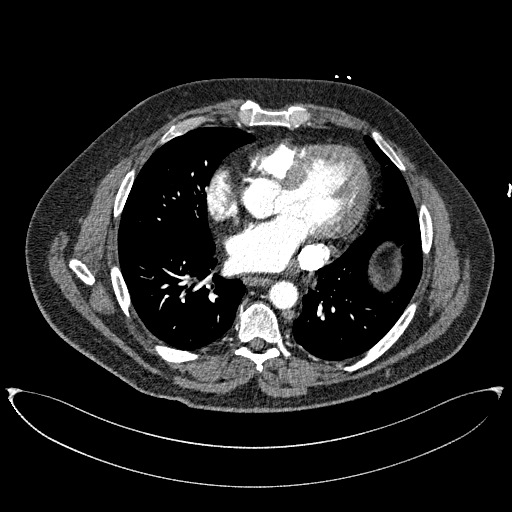
[im 172/316  lung]
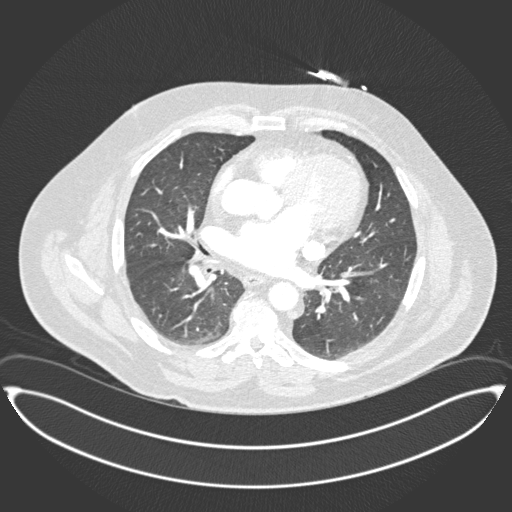
[im 201/316  soft-tissue]
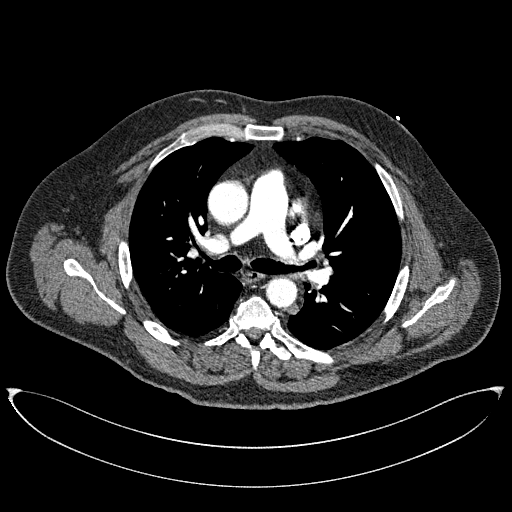
[im 215/316  lung]
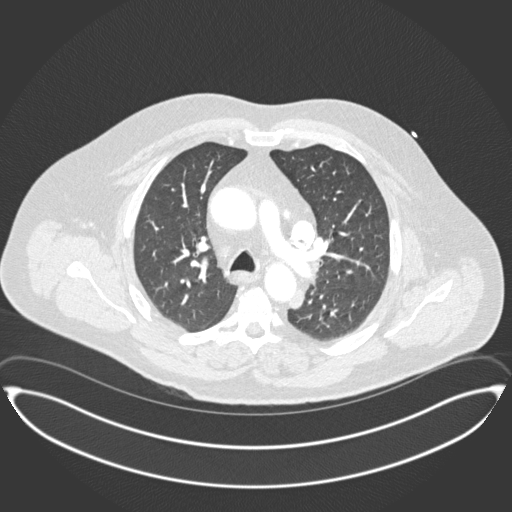
[im 244/316  soft-tissue]
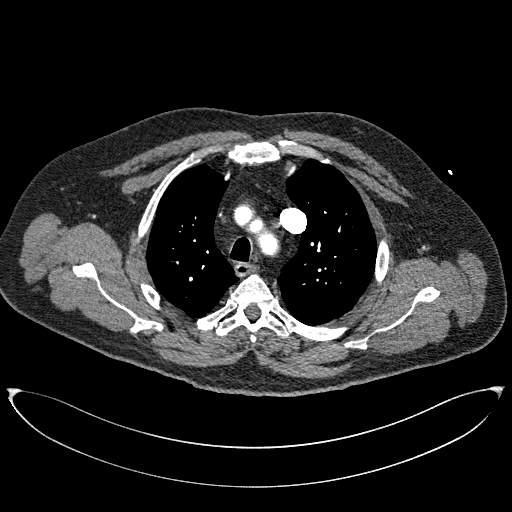
[im 258/316  lung]
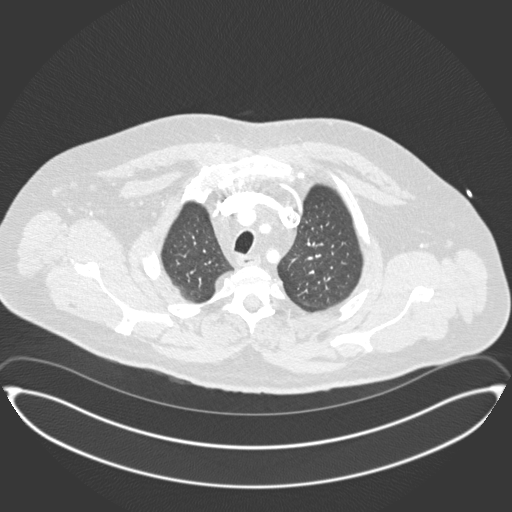
[im 273/316  soft-tissue]
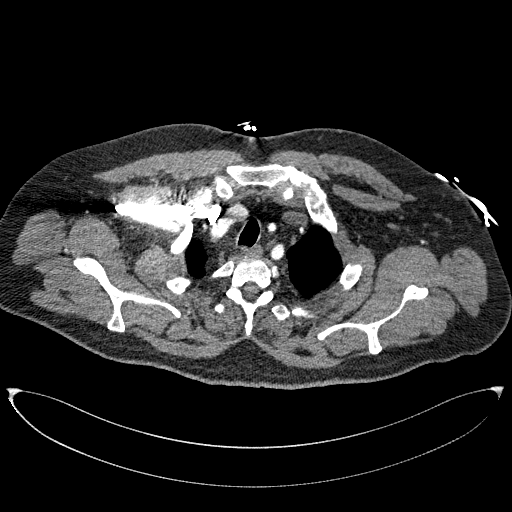
[im 301/316  lung]
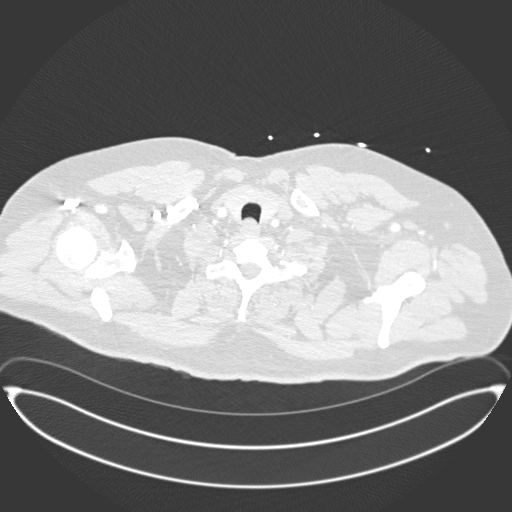

[Series 8: coronal mpr · coronal · 0.63mm/px · 3 of 164 slices shown]
[im 41/164  soft-tissue]
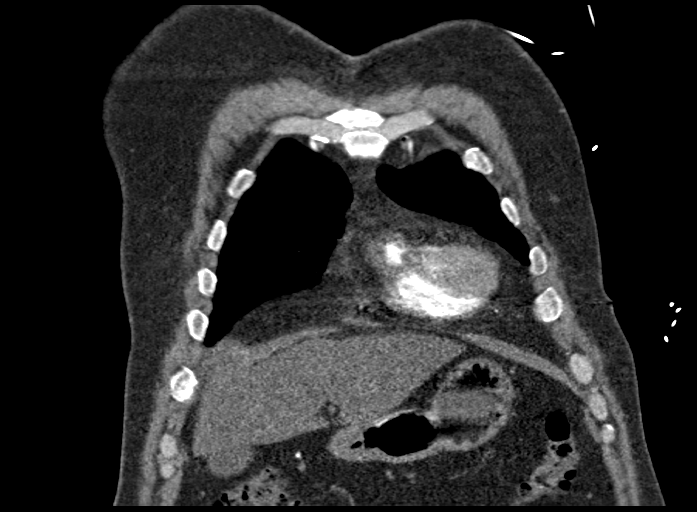
[im 82/164  soft-tissue]
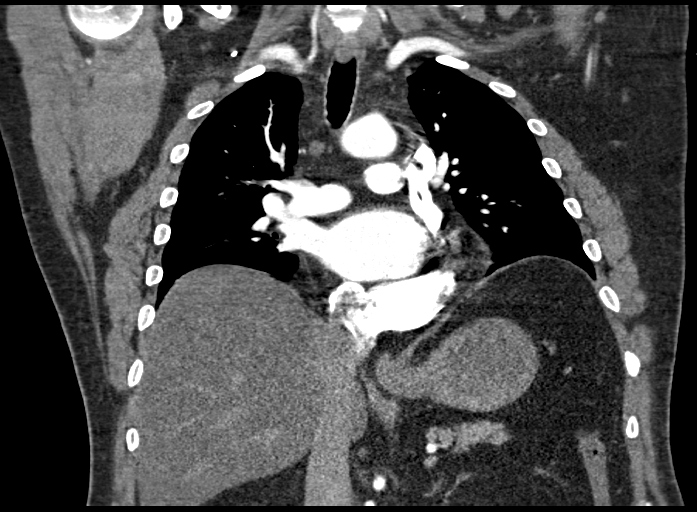
[im 123/164  soft-tissue]
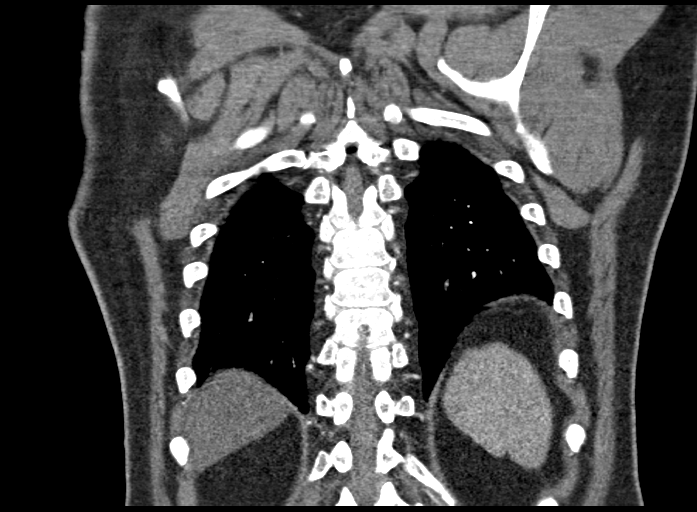

[18 of 46 positions shown; findings below may reference images not displayed]

FINDINGS: Cardiovascular:  Again identified is a left-sided SVC.

Mediastinum/Nodes: No enlarged mediastinal, hilar, or axillary lymph
nodes. Thyroid gland, trachea, and esophagus demonstrate no
significant findings.

Lungs/Pleura: Lungs are clear. No pleural effusion or pneumothorax.

Upper Abdomen: There is diffuse hepatic steatosis. There is some
scarring of the upper pole the left kidney.

Musculoskeletal: No chest wall abnormality. No acute or significant
osseous findings.

Review of the MIP images confirms the above findings.
IMPRESSION: 1. No PE identified.  No dissection.
2. The lungs are clear.
3. Again noted is a left-sided SVC, a normal variant.
4. Hepatic steatosis.

## 2020-11-24 ENCOUNTER — Other Ambulatory Visit (HOSPITAL_COMMUNITY): Payer: Self-pay

## 2020-12-02 ENCOUNTER — Other Ambulatory Visit (HOSPITAL_COMMUNITY): Payer: Self-pay

## 2020-12-02 MED ORDER — DICLOXACILLIN SODIUM 250 MG PO CAPS
ORAL_CAPSULE | ORAL | 0 refills | Status: DC
  Filled 2020-12-02: qty 80, 10d supply, fill #0
  Filled 2020-12-02: qty 80, 13d supply, fill #0

## 2020-12-03 ENCOUNTER — Other Ambulatory Visit (HOSPITAL_COMMUNITY): Payer: Self-pay

## 2020-12-04 ENCOUNTER — Telehealth: Payer: Self-pay | Admitting: Internal Medicine

## 2020-12-04 NOTE — Telephone Encounter (Signed)
Okay to schedule in the morning?

## 2020-12-04 NOTE — Telephone Encounter (Signed)
Scheduled

## 2020-12-04 NOTE — Telephone Encounter (Signed)
Pt returned call. Discussed issue, pt is going to discuss w/ PCP who has been prescribing/monitoring. Aware we will further address at [place] next week, but we have nothing prior to that and pt should be seen today/tomorrow w/ reported swelling.  Aware he needs blood work as well. He will reach out to PCP to further address and keep appt next week w/ cardiology. Pt appreciates the follow up/discussion.

## 2020-12-04 NOTE — Telephone Encounter (Signed)
Left message to call back  

## 2020-12-04 NOTE — Telephone Encounter (Signed)
Kyle Dawson [phone]  Kyle Dawson called to say both of his feet are swollen, no ankles at all, he has been taking his Lasix, has even tried taking 80 mg instead of 40 mg, did not help. He called Cardiologist and they wanted him to follow up with you since you prescribed Lasix, they will see him next week. He would like to see you in the morning if possible.

## 2020-12-05 ENCOUNTER — Encounter: Payer: Self-pay | Admitting: Internal Medicine

## 2020-12-05 ENCOUNTER — Other Ambulatory Visit (HOSPITAL_COMMUNITY): Payer: Self-pay

## 2020-12-05 ENCOUNTER — Other Ambulatory Visit: Payer: Self-pay

## 2020-12-05 ENCOUNTER — Ambulatory Visit (INDEPENDENT_AMBULATORY_CARE_PROVIDER_SITE_OTHER): Payer: 59 | Admitting: Internal Medicine

## 2020-12-05 VITALS — BP 100/80 | HR 112 | Temp 97.7°F | Ht 70.0 in | Wt 250.0 lb

## 2020-12-05 DIAGNOSIS — Z8679 Personal history of other diseases of the circulatory system: Secondary | ICD-10-CM | POA: Diagnosis not present

## 2020-12-05 DIAGNOSIS — M7989 Other specified soft tissue disorders: Secondary | ICD-10-CM

## 2020-12-05 DIAGNOSIS — S80212A Abrasion, left knee, initial encounter: Secondary | ICD-10-CM | POA: Diagnosis not present

## 2020-12-05 DIAGNOSIS — E119 Type 2 diabetes mellitus without complications: Secondary | ICD-10-CM | POA: Diagnosis not present

## 2020-12-05 DIAGNOSIS — I10 Essential (primary) hypertension: Secondary | ICD-10-CM

## 2020-12-05 DIAGNOSIS — L089 Local infection of the skin and subcutaneous tissue, unspecified: Secondary | ICD-10-CM

## 2020-12-05 NOTE — Progress Notes (Signed)
   Subjective:    Patient ID: Kyle Dawson, male    DOB: 01-Feb-1966, 55 y.o.   MRN: [id number]  HPI Patient has worsening of pedal edema. Saw Dr. Baird Kay in May and was placed on metoprolol 200 mg daily and Multaq 400 mg twice a day. Has been taking 80 mg Lasix q 12 hours for 36 hours.He thinks Lasix is not working for him as he has had less urination. Not taking Hydralazine until this past week but now  taking twice daily.  He has infected abrasion left knee area.  He saw a nurse practitioner at his work and was placed on dicloxacillin 4 times a day for 10 days.  He started this antibiotic last night.  He says he has Bactroban to put on the open lesion as well.  There is no drainage from the lesion.  He had annual wellness visit January 2022.  History of glucose intolerance, obesity and history of alcohol abuse.  Had negative Cardiolite study in 2007.  Presented with chest pain July 2021 and cardiac evaluation was negative.  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  Maternal family history includes heart disease and hypertension.  Mother with history of hypertension and has had carotid endarterectomy.   Review of Systems No chest pain. Has exertional chest pain with stair climbing.     Objective:   Physical Exam  Approximate 4.5 sonometer by 0.5 to 1 cm infected abrasion left knee area.  It has surrounding erythema but is currently clean without drainage.  BP 100/80 pulse 112 regular T 97.7 pulse ox 97% Weight 250 pounds. Ht 70f 10 in BMI 35.87- gained 6 pounds since June 2022.  His chest is clear to auscultation without rales or wheezing.  Cardiac exam: Mild tachycardia rate approximately 100 regular rate and rhythm without ectopy.  No murmur appreciated.  Abdomen obese nondistended without hepatosplenomegaly masses or tenderness.  He has 2-3+ pitting edema of the lower extremities.  Affect thought and judgment are  normal.      Assessment & Plan:  CHF-appears to be stable at the present time other than dependent edema  Infected abrasion left knee being treated with dicloxacillin orally and Bactroban ointment  Dependent edema he will try Demadex 40 mg twice daily and follow-up with Dr. CCurt Bearsnext week.  Discontinue Lasix.  History of atrial fibrillation with rapid ventricular response has appointment with Dr. CCurt Bearsnext week.  Pulse is 112 and regular with stable pulse oximetry of 97%  BPH treated with Flomax  Erectile dysfunction treated with Viagra  History of hyperlipidemia treated with Crestor 20 mg daily  Essential hypertension-blood pressure stable at 100/80  BMI 35.87-weight was 252 at nurse practitioner office July 26 and is now 250.  Plan: Health maintenance exam due January 2023.  Defer cardiac management to Dr. CCurt Bears  We have drawn today BNP, vein minute and CBC with differential.  These results are pending.  Take Demadex 40 mg every 12 hours.  Discontinue Lasix.  Tetanus immunization update given.  Use Bactroban on infected abrasion twice daily and finish course of dicloxacillin prescribed by nurse practitioner

## 2020-12-05 NOTE — Patient Instructions (Addendum)
Discontinue Lasix.  Take Demadex 40 mg every 12 hours.  Follow-up with Dr. Curt Bears next week.  Labs drawn and pending.  Tetanus immunization update given.  Finish course of dicloxacillin prescribed by nurse practitioner and use Bactroban on infected abrasion twice daily.  Labs drawn and pending.

## 2020-12-06 LAB — BASIC METABOLIC PANEL
BUN: 15 mg/dL (ref 7–25)
CO2: 28 mmol/L (ref 20–32)
Calcium: 8.4 mg/dL — ABNORMAL LOW (ref 8.6–10.3)
Chloride: 101 mmol/L (ref 98–110)
Creat: 1.24 mg/dL (ref 0.70–1.30)
Glucose, Bld: 234 mg/dL — ABNORMAL HIGH (ref 65–99)
Potassium: 3.4 mmol/L — ABNORMAL LOW (ref 3.5–5.3)
Sodium: 141 mmol/L (ref 135–146)

## 2020-12-06 LAB — CBC WITH DIFFERENTIAL/PLATELET
Absolute Monocytes: 686 cells/uL (ref 200–950)
Basophils Absolute: 58 cells/uL (ref 0–200)
Basophils Relative: 1.2 %
Eosinophils Absolute: 91 cells/uL (ref 15–500)
Eosinophils Relative: 1.9 %
HCT: 42.4 % (ref 38.5–50.0)
Hemoglobin: 14.6 g/dL (ref 13.2–17.1)
Lymphs Abs: 1272 cells/uL (ref 850–3900)
MCH: 33.4 pg — ABNORMAL HIGH (ref 27.0–33.0)
MCHC: 34.4 g/dL (ref 32.0–36.0)
MCV: 97 fL (ref 80.0–100.0)
MPV: 9.3 fL (ref 7.5–12.5)
Monocytes Relative: 14.3 %
Neutro Abs: 2693 cells/uL (ref 1500–7800)
Neutrophils Relative %: 56.1 %
Platelets: 240 10*3/uL (ref 140–400)
RBC: 4.37 10*6/uL (ref 4.20–5.80)
RDW: 14.8 % (ref 11.0–15.0)
Total Lymphocyte: 26.5 %
WBC: 4.8 10*3/uL (ref 3.8–10.8)

## 2020-12-06 LAB — BRAIN NATRIURETIC PEPTIDE: Brain Natriuretic Peptide: 232 pg/mL — ABNORMAL HIGH (ref <100)

## 2020-12-09 ENCOUNTER — Other Ambulatory Visit: Payer: Self-pay

## 2020-12-09 ENCOUNTER — Telehealth: Payer: Self-pay | Admitting: Internal Medicine

## 2020-12-09 ENCOUNTER — Other Ambulatory Visit (HOSPITAL_COMMUNITY): Payer: Self-pay

## 2020-12-09 ENCOUNTER — Ambulatory Visit: Payer: PRIVATE HEALTH INSURANCE | Admitting: Cardiology

## 2020-12-09 MED ORDER — POTASSIUM CHLORIDE CRYS ER 20 MEQ PO TBCR
20.0000 meq | EXTENDED_RELEASE_TABLET | Freq: Every day | ORAL | 0 refills | Status: DC
  Filled 2020-12-09: qty 60, 60d supply, fill #0

## 2020-12-09 MED ORDER — TORSEMIDE 20 MG PO TABS
ORAL_TABLET | ORAL | 0 refills | Status: DC
  Filled 2020-12-09: qty 120, 30d supply, fill #0

## 2020-12-09 MED ORDER — POTASSIUM CHLORIDE CRYS ER 20 MEQ PO TBCR
20.0000 meq | EXTENDED_RELEASE_TABLET | Freq: Every day | ORAL | 0 refills | Status: DC
  Filled 2020-12-09: qty 30, 30d supply, fill #0

## 2020-12-09 NOTE — Telephone Encounter (Signed)
Was seen last week and was told to take Demedex 40 mg twice daily. Available as 20 mg. Therefore dose would be 20 mg 2 tabs twice daily. Was having symptoms of fluid overload when seen lasr week. Needs Demadex 20 mg  2 tabs twice a day until can see Cardiologist.

## 2020-12-09 NOTE — Progress Notes (Deleted)
Electrophysiology Office Note   Date:  12/09/2020   ID:  Kyle Dawson, DOB 07/13/1965, MRN [id number]  PCP:  Elby Showers, MD  Cardiologist:  Harrington Challenger Primary Electrophysiologist:  Sabre Romberger Meredith Leeds, MD    Chief Complaint: AF   History of Present Illness: Kyle Dawson is a 55 y.o. male who is being seen today for the evaluation of AF at the request of Baxley, Cresenciano Lick, MD. Presenting today for electrophysiology evaluation.  He has a history significant for coronary artery disease, paroxysmal atrial fibrillation, diabetes, hypertension, hyperlipidemia, and alcohol abuse.  He was hospitalized in 2021 with atrial fibrillation rapid rates.  He had been drinking prior to that.  He has symptoms of fatigue and lower extremity swelling.  This last visit, he was put on Multaq.  Today, denies symptoms of palpitations, chest pain, shortness of breath, orthopnea, PND, lower extremity edema, claudication, dizziness, presyncope, syncope, bleeding, or neurologic sequela. The patient is tolerating medications without difficulties. ***    Past Medical History:  Diagnosis Date   Basal cell carcinoma    Chest pain    Diabetes mellitus without complication (HCC)    Edema    lower extremity   Hypertension    Squamous cell carcinoma    R ear, nose, each side of face   Past Surgical History:  Procedure Laterality Date   Removal basal cell carcinoma     Removal squamous cell carcinoma       Current Outpatient Medications  Medication Sig Dispense Refill   apixaban (ELIQUIS) 5 MG TABS tablet Take 1 tablet (5 mg total) by mouth 2 (two) times daily. 180 tablet 1   dicloxacillin (DYNAPEN) 250 MG capsule Take 2 capsules (519m) by mouth each morning, noon, evening and bedtime for 10 days as directed 80 capsule 0   diltiazem (CARDIZEM CD) 240 MG 24 hr capsule TAKE 1 CAPSULE BY MOUTH ONCE A DAY 90 capsule 3   dronedarone (MULTAQ) 400 MG tablet Take 1 tablet (400 mg total) by mouth 2 (two) times  daily with a meal. 60 tablet 3   glipiZIDE (GLUCOTROL) 5 MG tablet Take 0.5 tablets (2.5 mg total) by mouth 2 (two) times daily before a meal. 60 tablet 11   hydrALAZINE (APRESOLINE) 25 MG tablet TAKE 1 TABLET BY MOUTH 3 TIMES DAILY AS NEEDED. AS NEEDED FOR SYSTOLIC BP >>644 [id number] tablet 11   irbesartan (AVAPRO) 300 MG tablet Take 1 tablet (300 mg total) by mouth daily. 90 tablet 3   metoprolol succinate (TOPROL-XL) 100 MG 24 hr tablet Take 2 tablets (200 mg total) by mouth daily. Take with or immediately following a meal. 90 tablet 2   Multiple Vitamin (MULTIVITAMIN ADULT PO) Take by mouth.     Omega-3 Fatty Acids (FISH OIL PO) Take 1 tablet by mouth daily.      OneTouch Delica Lancets 359DMISC USE TO CHECK BLOOD SUGAR ONCE DAILY 100 each 1   rosuvastatin (CRESTOR) 20 MG tablet Take 1 tablet (20 mg total) by mouth daily. 90 tablet 3   sildenafil (VIAGRA) 100 MG tablet TAKE 1 TABLET BY MOUTH DAILY AS NEEDED FOR ERECTILE DYSFUNCTION. 12 tablet 1   sulfamethoxazole-trimethoprim (BACTRIM DS) 800-160 MG tablet Take 1 tablet by mouth 2 (two) times daily. 20 tablet 0   tamsulosin (FLOMAX) 0.4 MG CAPS capsule TAKE 1 CAPSULE BY MOUTH DAILY 90 capsule 3   No current facility-administered medications for this visit.    Allergies:   Oxycodone  Social History:  The patient  reports that he has never smoked. He has never used smokeless tobacco. He reports current alcohol use of about 1.0 standard drink of alcohol per week. He reports that he does not use drugs.   Family History:  The patient's family history includes CAD in his maternal grandfather; Heart failure in his mother and paternal grandfather; Hypertension in his father and mother; Peripheral vascular disease (age of onset: 77) in his mother.   ROS:  Please see the history of present illness.   Otherwise, review of systems is positive for none.   All other systems are reviewed and negative.   PHYSICAL EXAM: VS:  There were no vitals taken  for this visit. , BMI There is no height or weight on file to calculate BMI. GEN: Well nourished, well developed, in no acute distress  HEENT: normal  Neck: no JVD, carotid bruits, or masses Cardiac: ***RRR; no murmurs, rubs, or gallops,no edema  Respiratory:  clear to auscultation bilaterally, normal work of breathing GI: soft, nontender, nondistended, + BS MS: no deformity or atrophy  Skin: warm and dry Neuro:  Strength and sensation are intact Psych: euthymic mood, full affect  EKG:  EKG {ACTION; IS/IS [id number]} ordered today. Personal review of the ekg ordered *** shows ***   Recent Labs: 05/16/2020: ALT 21 12/05/2020: Brain Natriuretic Peptide 232; BUN 15; Creat 1.24; Hemoglobin 14.6; Platelets 240; Potassium 3.4; Sodium 141    Lipid Panel     Component Value Date/Time   CHOL 145 09/09/2020 0737   TRIG 106 09/09/2020 0737   HDL 51 09/09/2020 0737   CHOLHDL 2.8 09/09/2020 0737   CHOLHDL 3.4 05/16/2020 0920   VLDL 28 05/26/2019 0434   LDLCALC 75 09/09/2020 0737   LDLCALC 105 (H) 05/16/2020 0920     Wt Readings from Last 3 Encounters:  12/05/20 250 lb (113.4 kg)  10/09/20 244 lb 6.4 oz (110.9 kg)  09/30/20 246 lb (111.6 kg)      Other studies Reviewed: Additional studies/ records that were reviewed today include: TTE 05/26/19  Review of the above records today demonstrates:   1. Left ventricular ejection fraction, by visual estimation, is 60 to  65%. The left ventricle has normal function. There is moderately increased  left ventricular hypertrophy.   2. Left ventricular diastolic function could not be evaluated.   3. The left ventricle has no regional wall motion abnormalities.   4. Global right ventricle was not well visualized.The right ventricular  size is not well visualized. Right vetricular wall thickness was not  assessed.   5. Left atrial size was normal.   6. Right atrial size was normal.   7. The mitral valve is grossly normal. No evidence of mitral  valve  regurgitation.   8. The tricuspid valve is grossly normal.   9. The aortic valve was not well visualized. Aortic valve regurgitation  is not visualized.  10. The pulmonic valve was grossly normal. Pulmonic valve regurgitation is  not visualized.  11. Left partial anomalous pulmonary venous return. (persisent left SVC)  -dilated coronary sinus.  12. The inferior vena cava is normal in size with <50% respiratory  variability, suggesting right atrial pressure of 8 mmHg.  13. The interatrial septum was not well visualized.   Cardiac monitor 08/11/20 personally reviewed Sinus rhythm and atrial fibrillation   Rates 44 to 226 bpm  Average HR 86 bpm   Predominent rhythm was SR. Occasional PACs, PVCs  4 episodes of NSVT  Longest 7 beats Atrial fibrillation burden: 17^  Rates 78 to 225 bpm   Average HR 135 bpm   Longest episode of afib 25 hours with average HR 126 bpm     1 triggered event for SOB correlated with afib with RVR   Majority of afib not sensed/triggered Events of palpitations correlated with isolated PVCs    NSVT was not sensed.     ASSESSMENT AND PLAN:  1.  Paroxysmal atrial fibrillation: Currently on diltiazem and Eliquis.  CHA2DS2-VASc of at least 2.  Cardiac monitor showing long episodes of atrial fibrillation.  Currently on Multaq.  High risk medication monitoring.***  2.  Coronary artery disease: Managed medically.  No changes at this time.  3.  Hypertension:***  4.  Hyperlipidemia: LDL goal less than 70.  Plan per primary cardiology.  5.  Alcohol abuse: Complete cessation encouraged  6.  Obesity: Diet and exercise encouraged.   Current medicines are reviewed at length with the patient today.   The patient does not have concerns regarding his medicines.  The following changes were made today: ***  Labs/ tests ordered today include:  No orders of the defined types were placed in this encounter.    Disposition:   FU with Naylee Frankowski ***months  Signed, Areya Lemmerman  Meredith Leeds, MD  12/09/2020 7:47 AM     [facility] [address] [place] [address] [phone] (office) [phone] (fax)

## 2020-12-09 NOTE — Addendum Note (Signed)
Addended by: Mady Haagensen on: 12/09/2020 09:36 AM   Modules accepted: Orders

## 2020-12-09 NOTE — Telephone Encounter (Signed)
He was seen here July 29 with fluid overload. I changed him from Lasix to Demadex 40 mg twice daily but Rx not sent in. Has appt with cardiology PA on Thursday Aug 4th Says feet are quite swollen. K was 3.4 July 29 and Creatinine was 1.24 BUN was 15. BNP was 232.Sent in [place] 48mq once daily.  Cardiology can decide on subsequent diuresis therapy at visit this coming Thursday.

## 2020-12-10 ENCOUNTER — Other Ambulatory Visit (HOSPITAL_COMMUNITY): Payer: Self-pay

## 2020-12-10 NOTE — Progress Notes (Signed)
PCP:  Elby Showers, MD Primary Cardiologist: Dorris Carnes, MD Electrophysiologist: Will Meredith Leeds, MD   Kyle Dawson is a 55 y.o. male seen today for Will Meredith Leeds, MD for  acute visit due to edema and AF .    Since last being seen in our clinic the patient reports doing poorly over the past week. Seen by and switched from lasix to torsemide with markedly decreased UOP and ++edema. Edema has improved, and UOP has picked back up. He is having mild chest pressure at rest but not exertion. SOB with mild to moderate exertion. This has eased off as his swelling has improved.   Past Medical History:  Diagnosis Date   Basal cell carcinoma    Chest pain    Diabetes mellitus without complication (HCC)    Edema    lower extremity   Hypertension    Squamous cell carcinoma    R ear, nose, each side of face   Past Surgical History:  Procedure Laterality Date   Removal basal cell carcinoma     Removal squamous cell carcinoma      Current Outpatient Medications  Medication Sig Dispense Refill   apixaban (ELIQUIS) 5 MG TABS tablet Take 1 tablet (5 mg total) by mouth 2 (two) times daily. 180 tablet 1   dicloxacillin (DYNAPEN) 250 MG capsule Take 2 capsules (595m) by mouth each morning, noon, evening and bedtime for 10 days as directed 80 capsule 0   diltiazem (CARDIZEM CD) 240 MG 24 hr capsule TAKE 1 CAPSULE BY MOUTH ONCE A DAY 90 capsule 3   dronedarone (MULTAQ) 400 MG tablet Take 1 tablet (400 mg total) by mouth 2 (two) times daily with a meal. 60 tablet 3   glipiZIDE (GLUCOTROL) 5 MG tablet Take 0.5 tablets (2.5 mg total) by mouth 2 (two) times daily before a meal. 60 tablet 11   irbesartan (AVAPRO) 300 MG tablet Take 1 tablet (300 mg total) by mouth daily. 90 tablet 3   metoprolol succinate (TOPROL-XL) 100 MG 24 hr tablet Take 2 tablets (200 mg total) by mouth daily. Take with or immediately following a meal. 90 tablet 2   Multiple Vitamin (MULTIVITAMIN ADULT PO) Take by  mouth.     Omega-3 Fatty Acids (FISH OIL PO) Take 1 tablet by mouth daily.      OneTouch Delica Lancets 309XMISC USE TO CHECK BLOOD SUGAR ONCE DAILY 100 each 1   potassium chloride SA (KLOR-CON) 20 MEQ tablet Take 1 tablet (20 mEq total) by mouth daily. 60 tablet 0   sildenafil (VIAGRA) 100 MG tablet TAKE 1 TABLET BY MOUTH DAILY AS NEEDED FOR ERECTILE DYSFUNCTION. 12 tablet 1   sulfamethoxazole-trimethoprim (BACTRIM DS) 800-160 MG tablet Take 1 tablet by mouth 2 (two) times daily. 20 tablet 0   tamsulosin (FLOMAX) 0.4 MG CAPS capsule TAKE 1 CAPSULE BY MOUTH DAILY 90 capsule 3   torsemide (DEMADEX) 20 MG tablet TAKE TWO TABLETS BY MOUTH TWICE DAILY 120 tablet 0   hydrALAZINE (APRESOLINE) 25 MG tablet TAKE 1 TABLET BY MOUTH 3 TIMES DAILY AS NEEDED. AS NEEDED FOR SYSTOLIC BP >>[id number] tablet 11   rosuvastatin (CRESTOR) 20 MG tablet Take 1 tablet (20 mg total) by mouth daily. 90 tablet 3   No current facility-administered medications for this visit.    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[place]    Employer: [employer]   Occupation: [employer]  Tobacco Use   Smoking status: Never   Smokeless tobacco: Never  Vaping Use   Vaping Use: Never used  Substance and Sexual Activity   Alcohol use: Yes    Alcohol/week: 1.0 standard drink    Types: 1 Shots of liquor per week   Drug use: No   Sexual activity: Not on file  Other Topics Concern   Not on file  Social History Narrative   Patient drinks 1 cup of coffee a day    Social Determinants of Health   Financial Resource Strain: Not on file  Food Insecurity: Not on file  Transportation Needs: Not on file  Physical  Activity: Not on file  Stress: Not on file  Social Connections: Not on file  Intimate Partner Violence: Not on file     Review of Systems: General: No chills, fever, night sweats or weight changes  Cardiovascular:  No chest pain, dyspnea on exertion, edema, orthopnea, palpitations, paroxysmal nocturnal dyspnea Dermatological: No rash, lesions or masses Respiratory: No cough, dyspnea Urologic: No hematuria, dysuria Abdominal: No nausea, vomiting, diarrhea, bright red blood per rectum, melena, or hematemesis Neurologic: No visual changes, weakness, changes in mental status All other systems reviewed and are otherwise negative except as noted above.  Physical Exam: Vitals:   12/11/20 0926  BP: 102/60  Pulse: 100  Weight: 249 lb 12.8 oz (113.3 kg)  Height: 5' 10"  (1.778 m)    GEN- The patient is well appearing, alert and oriented x 3 today.   HEENT: normocephalic, atraumatic; sclera clear, conjunctiva pink; hearing intact; oropharynx clear; neck supple, no JVP Lymph- no cervical lymphadenopathy Lungs- Clear to ausculation bilaterally, normal work of breathing.  No wheezes, rales, rhonchi Heart- Regular rate and rhythm, no murmurs, rubs or gallops, PMI not laterally displaced GI- soft, non-tender, non-distended, bowel sounds present, no hepatosplenomegaly Extremities- no clubbing, cyanosis, or edema; DP/PT/radial pulses 2+ bilaterally MS- no significant deformity or atrophy Skin- warm and dry, no rash or lesion Psych- euthymic mood, full affect Neuro- strength and sensation are intact  EKG is ordered. Personal review of EKG from today shows Atrial fibrillation at 100 bpm   Additional studies reviewed include: Previous EP and AF notes  Assessment and Plan:  1. Persistent Atrial Fibrillation (ICD10:  I48.19) AF today. Last time we sent him for [facility] he was in sinus on arrival Continue eliquis 5 mg BID for CHA2DS2-VASc of at least 3.  Continue diltiazem 240 mg daily Continue  Toprol 200 mg daily Continue Multaq 400 mg BID for now Have recommended [place] to help keep track of whether he is in NSR or AF at home.  Will plan on updating Echo and ischemic work up in 1-2 weeks after repeat visit and labs.    2. Secondary Hypercoagulable State (ICD10:  D68.69) Continue Apixaban (Eliquis) as above.  Labs today. Denies bleeding.    3. Obesity Body mass index is 35.84 kg/m.  Lifestyle modification was discussed at length including regular exercise and weight reduction.   4. Snoring/suspected OSA -> Sleep disordered breathing Needs sleep study after acute issues managed.   5. CAD Pt has mild chest discomfort, not related  to exertion in setting of volume overload.  No hemodynamically significant coronary artery lesions by CT FFR 12/2017.  Will diurese and then order Myoview for completeness.    6. HTN Stable on current regiment.   7. ETOH use Encouraged cessation. He is drinking 2-3 servings of whiskey nightly.  Stressed that any amount of ETOH increased risk of AF.   8. Acute on chronic diastolic CHF Volume status elevated but improving on torsemide 40 mg BID.  Labs again today with low K on last BMET Update Echo once diuresed.  Labs today and close follow up. Will defer [facility] for now as last time he was scheduled he arrived in sinus. Recommended [employer]/ [employer].  RTC 2 weeks. Sooner or ED with worsening symptoms.   Shirley Friar, PA-C  12/11/20 9:36 AM

## 2020-12-11 ENCOUNTER — Ambulatory Visit (INDEPENDENT_AMBULATORY_CARE_PROVIDER_SITE_OTHER): Payer: PRIVATE HEALTH INSURANCE | Admitting: Student

## 2020-12-11 ENCOUNTER — Encounter: Payer: Self-pay | Admitting: Student

## 2020-12-11 ENCOUNTER — Other Ambulatory Visit: Payer: Self-pay

## 2020-12-11 ENCOUNTER — Telehealth: Payer: Self-pay | Admitting: Internal Medicine

## 2020-12-11 VITALS — BP 102/60 | HR 100 | Ht 70.0 in | Wt 249.8 lb

## 2020-12-11 DIAGNOSIS — I4819 Other persistent atrial fibrillation: Secondary | ICD-10-CM | POA: Diagnosis not present

## 2020-12-11 DIAGNOSIS — F1010 Alcohol abuse, uncomplicated: Secondary | ICD-10-CM | POA: Diagnosis not present

## 2020-12-11 DIAGNOSIS — D6869 Other thrombophilia: Secondary | ICD-10-CM | POA: Diagnosis not present

## 2020-12-11 DIAGNOSIS — I119 Hypertensive heart disease without heart failure: Secondary | ICD-10-CM

## 2020-12-11 DIAGNOSIS — E785 Hyperlipidemia, unspecified: Secondary | ICD-10-CM

## 2020-12-11 LAB — LIPID PANEL
Chol/HDL Ratio: 2 ratio (ref 0.0–5.0)
Cholesterol, Total: 158 mg/dL (ref 100–199)
HDL: 81 mg/dL (ref >39)
LDL Chol Calc (NIH): 62 mg/dL (ref 0–99)
Triglycerides: 83 mg/dL (ref 0–149)
VLDL Cholesterol Cal: 15 mg/dL (ref 5–40)

## 2020-12-11 LAB — BASIC METABOLIC PANEL
BUN/Creatinine Ratio: 13 (ref 9–20)
BUN: 17 mg/dL (ref 6–24)
CO2: 35 mmol/L — ABNORMAL HIGH (ref 20–29)
Calcium: 8.7 mg/dL (ref 8.7–10.2)
Chloride: 99 mmol/L (ref 96–106)
Creatinine, Ser: 1.31 mg/dL — ABNORMAL HIGH (ref 0.76–1.27)
Glucose: 117 mg/dL — ABNORMAL HIGH (ref 65–99)
Potassium: 3.9 mmol/L (ref 3.5–5.2)
Sodium: 142 mmol/L (ref 134–144)
eGFR: 63 mL/min/{1.73_m2} (ref >59)

## 2020-12-11 LAB — MAGNESIUM: Magnesium: 1.7 mg/dL (ref 1.6–2.3)

## 2020-12-11 NOTE — Telephone Encounter (Signed)
Patient aware,advised he must follow up with cardiologist in two weeks.

## 2020-12-11 NOTE — Telephone Encounter (Signed)
Kyle Dawson called to see if he still needed to come in the morning for labs, he stated they did a lot of labs at cardiology today. Please call him when you can.

## 2020-12-11 NOTE — Patient Instructions (Signed)
Medication Instructions:  Your physician recommends that you continue on your current medications as directed. Please refer to the Current Medication list given to you today.  *If you need a refill on your cardiac medications before your next appointment, please call your pharmacy*   Lab Work: TODAY: BMET, Mg - STAT ~ [place]  If you have labs (blood work) drawn today and your tests are completely normal, you will receive your results only by: [place] (if you have MyChart) OR A paper copy in the mail If you have any lab test that is abnormal or we need to change your treatment, we will call you to review the results.  Follow-Up: At [facility], you and your health needs are our priority.  As part of our continuing mission to provide you with exceptional heart care, we have created designated Provider Care Teams.  These Care Teams include your primary Cardiologist (physician) and Advanced Practice Providers (APPs -  Physician Assistants and Nurse Practitioners) who all work together to provide you with the care you need, when you need it.   Your next appointment:   As scheduled  Other Instructions [employer]  FDA-cleared EKG at your fingertips. - [employer]   Agricultural engineer, [employer]. [email or web address]   FDA-cleared, clinical grade mobile EKG monitor: Jodelle Red is the most clinically-validated mobile EKG used by the world's leading cardiac care medical professionals.  This may be useful in monitoring palpitations.  We do not have access to have them emailed and reviewed but will be glad to review while in the office.

## 2020-12-11 NOTE — Telephone Encounter (Signed)
FYI  Patient had bmet done this morning at cardiologist office. Okay to cancel bmet that was scheduled for tomorrow morning?

## 2020-12-16 ENCOUNTER — Other Ambulatory Visit (HOSPITAL_COMMUNITY): Payer: Self-pay

## 2020-12-24 NOTE — Progress Notes (Signed)
PCP:  Elby Showers, MD Primary Cardiologist: Dorris Carnes, MD Electrophysiologist: Will Meredith Leeds, MD   Kyle Dawson is a 55 y.o. male seen today for Will Meredith Leeds, MD for routine electrophysiology followup.  Since last being seen in our clinic the patient reports doing poorly.   He had syncope and was seen at [facility] yesterday. It occurred when he turned quickly after standing helping in clinic. Was felt to be dehydrated with AKI and torsemide adjusted.   Today, he is feeling OK despite rapid HR. He states at home his HR settles into the 70-80s and is regular at times, and others he is jumping up and down. He was told yesterday to hold his metoprolol and diltiazem until seeing [place], so has had neither today.    he denies chest pain, palpitations, dyspnea, PND, orthopnea, nausea, vomiting, dizziness, syncope, edema, weight gain, or early satiety.  Past Medical History:  Diagnosis Date   Basal cell carcinoma    Chest pain    Diabetes mellitus without complication (HCC)    Edema    lower extremity   Hypertension    Squamous cell carcinoma    R ear, nose, each side of face   Past Surgical History:  Procedure Laterality Date   Removal basal cell carcinoma     Removal squamous cell carcinoma      Current Outpatient Medications  Medication Sig Dispense Refill   apixaban (ELIQUIS) 5 MG TABS tablet Take 1 tablet (5 mg total) by mouth 2 (two) times daily. 180 tablet 1   dicloxacillin (DYNAPEN) 250 MG capsule Take 2 capsules (535m) by mouth each morning, noon, evening and bedtime for 10 days as directed 80 capsule 0   diltiazem (CARDIZEM CD) 240 MG 24 hr capsule TAKE 1 CAPSULE BY MOUTH ONCE A DAY 90 capsule 3   dronedarone (MULTAQ) 400 MG tablet Take 1 tablet (400 mg total) by mouth 2 (two) times daily with a meal. 60 tablet 3   glipiZIDE (GLUCOTROL) 5 MG tablet Take 0.5 tablets (2.5 mg total) by mouth 2 (two) times daily before a meal. 60 tablet 11   irbesartan  (AVAPRO) 300 MG tablet Take 1 tablet (300 mg total) by mouth daily. 90 tablet 3   metoprolol succinate (TOPROL-XL) 100 MG 24 hr tablet Take 2 tablets (200 mg total) by mouth daily. Take with or immediately following a meal. 90 tablet 2   Multiple Vitamin (MULTIVITAMIN ADULT PO) Take by mouth.     Omega-3 Fatty Acids (FISH OIL PO) Take 1 tablet by mouth daily.      OneTouch Delica Lancets 386VMISC USE TO CHECK BLOOD SUGAR ONCE DAILY 100 each 1   potassium chloride SA (KLOR-CON) 20 MEQ tablet Take 1 tablet (20 mEq total) by mouth daily. 60 tablet 0   sildenafil (VIAGRA) 100 MG tablet TAKE 1 TABLET BY MOUTH DAILY AS NEEDED FOR ERECTILE DYSFUNCTION. 12 tablet 1   sulfamethoxazole-trimethoprim (BACTRIM DS) 800-160 MG tablet Take 1 tablet by mouth 2 (two) times daily. 20 tablet 0   tamsulosin (FLOMAX) 0.4 MG CAPS capsule TAKE 1 CAPSULE BY MOUTH DAILY 90 capsule 3   torsemide (DEMADEX) 20 MG tablet TAKE TWO TABLETS BY MOUTH TWICE DAILY 120 tablet 0   rosuvastatin (CRESTOR) 20 MG tablet Take 1 tablet (20 mg total) by mouth daily. 90 tablet 3   No current facility-administered medications for this visit.    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: [employer]   Occupation: [employer]  Tobacco Use   Smoking status: Never   Smokeless tobacco: Never  Vaping Use   Vaping Use: Never used  Substance and Sexual Activity   Alcohol use: Yes    Alcohol/week: 1.0 standard drink    Types: 1 Shots of liquor per week   Drug use: No   Sexual activity: Not on file  Other Topics Concern   Not on file  Social History Narrative   Patient drinks 1 cup of coffee a day    Social  Determinants of Health   Financial Resource Strain: Not on file  Food Insecurity: Not on file  Transportation Needs: Not on file  Physical Activity: Not on file  Stress: Not on file  Social Connections: Not on file  Intimate Partner Violence: Not on file    Review of Systems: All other systems reviewed and are otherwise negative except as noted above.  Physical Exam: Vitals:   12/25/20 0912  BP: (!) 110/58  Pulse: (!) 138  SpO2: 97%  Weight: 247 lb 3.2 oz (112.1 kg)  Height: 5' 11"  (1.803 m)    GEN- The patient is well appearing, alert and oriented x 3 today.   HEENT: normocephalic, scattered ecchymosis from fall; sclera clear, conjunctiva pink; hearing intact; oropharynx clear; neck supple, no JVP Lymph- no cervical lymphadenopathy Lungs- Clear to ausculation bilaterally, normal work of breathing.  No wheezes, rales, rhonchi Heart- Rapid and irregular rate and rhythm, no murmurs, rubs or gallops, PMI not laterally displaced GI- soft, non-tender, non-distended, bowel sounds present, no hepatosplenomegaly Extremities- no clubbing or cyanosis. 1+ LLE edema in setting of knee injury over the weekend. DP/PT/radial pulses 2+ bilaterally MS- no significant deformity or atrophy Skin- warm and dry, no rash or lesion Psych- euthymic mood, full affect Neuro- strength and sensation are intact  EKG is ordered. Personal review of EKG from today shows AF with RVR at 138 bpm  Additional studies reviewed include: Previous EP office notes.   Assessment and Plan:  1. Persistent Atrial Fibrillation (ICD10:  I48.19) EKG today shows AF with RVR at 138 bpm, having not taken diltiazem or toprol today.  HRs improved to 100s at rest.  Continue eliquis 5 mg BID for CHA2DS2-VASc of at least 3.  Resume diltiazem 240 mg daily Change Toprol to 50 mg q am and 100 mg q pm.  Continue Multaq 400 mg BID for now Have recommended [place] to help keep track of whether he is in NSR or AF at home. He  maintains that he is going in and out.  Consider Echo once fib better controlled.  He would like to be considered for ablation. Not interested in tikosyn admission at this time. Poor candidate for amio given young age.    2. Secondary Hypercoagulable State (ICD10:  D68.69) Continue Apixaban (Eliquis) as above. Denies bleeding.    3. Obesity Body mass index is 34.48 kg/m.  Lifestyle modification was discussed at length including regular exercise and weight reduction.   4. Snoring/suspected OSA -> Sleep disordered breathing Needs sleep study after acute issues managed.   5.  CAD Pt has mild chest discomfort, not related to exertion in setting of volume overload.  No hemodynamically significant coronary artery lesions by CT FFR 12/2017.  Low threshold to consider Myoview    6. HTN Stable on current regiment.    7. ETOH use Encouraged cessation. He is drinking 2-3 servings of whiskey nightly.  Stressed that any amount of ETOH increases risk of AF.    8. Acute on chronic diastolic CHF Volume status looks OK today. Swelling in R leg related to fall and hurting his knee. He has not missed any eliquis.   9. Syncope In setting of rapid position change, AKI, and low BP at the time more likely vasovagal/orthostatic AKI noted on labwork.  DDx includes arhythmia (brady more likely than tachy), orthostasis, or vasovagal.   With his multiple co-morbidities and AF RVR, I did offer to have him sent over to the hospital. He and I both suspect that his HR will improve further once he takes his prescribed medications. He understands to call EMS if he has recurrent syncope. He refuses ED or admission at this time.  Will monitor HRs over the weekend and return the log with his labs early next week.   RTC 2 weeks. If rate doesn't come down after home medicine, if he has further syncope, worsening SOB, or chest pain, he verbalizes understanding of recommendation to call EMS.  Shirley Friar,  PA-C  12/25/20 9:27 AM

## 2020-12-25 ENCOUNTER — Ambulatory Visit (INDEPENDENT_AMBULATORY_CARE_PROVIDER_SITE_OTHER): Payer: PRIVATE HEALTH INSURANCE | Admitting: Student

## 2020-12-25 ENCOUNTER — Encounter: Payer: Self-pay | Admitting: Student

## 2020-12-25 ENCOUNTER — Other Ambulatory Visit: Payer: Self-pay

## 2020-12-25 VITALS — BP 110/58 | HR 138 | Ht 70.0 in | Wt 247.2 lb

## 2020-12-25 DIAGNOSIS — I480 Paroxysmal atrial fibrillation: Secondary | ICD-10-CM | POA: Diagnosis not present

## 2020-12-25 DIAGNOSIS — I4819 Other persistent atrial fibrillation: Secondary | ICD-10-CM

## 2020-12-25 DIAGNOSIS — E1159 Type 2 diabetes mellitus with other circulatory complications: Secondary | ICD-10-CM

## 2020-12-25 MED ORDER — IRBESARTAN 300 MG PO TABS: 150.0000 mg | ORAL_TABLET | Freq: Every day | ORAL | 3 refills | Status: DC

## 2020-12-25 MED ORDER — METOPROLOL SUCCINATE ER 100 MG PO TB24: ORAL_TABLET | ORAL | 2 refills | Status: DC

## 2020-12-25 NOTE — Patient Instructions (Signed)
Medication Instructions:  Your physician has recommended you make the following change in your medication:   ** Decrease your Irbesartan 364m to Irbesartan 1544m- 1/2 tablet by mouth daily.  ** Decrease Metoprolol to 5016m 1/2 tablet by mouth in the morning and 100m56m1 tablet by mouth at bed time.  *If you need a refill on your cardiac medications before your next appointment, please call your pharmacy*   Lab Work: CBC and BMET on Monday 12/29/2020  If you have labs (blood work) drawn today and your tests are completely normal, you will receive your results only by: [place] you have MyChart) OR A paper copy in the mail If you have any lab test that is abnormal or we need to change your treatment, we will call you to review the results.   Testing/Procedures: None ordered.    Follow-Up: [facility] and your health needs are our priority.  As part of our continuing mission to provide you with exceptional heart care, we have created designated Provider Care Teams.  These Care Teams include your primary Cardiologist (physician) and Advanced Practice Providers (APPs -  Physician Assistants and Nurse Practitioners) who all work together to provide you with the care you need, when you need it.  We recommend signing up for the patient portal called "MyChart".  Sign up information is provided on this After Visit Summary.  MyChart is used to connect with patients for Virtual Visits (Telemedicine).  Patients are able to view lab/test results, encounter notes, upcoming appointments, etc.  Non-urgent messages can be sent to your provider as well.   To learn more about what you can do with MyChart, go to [email or web address] Your next appointment:   2 weeks with AndyOda Kilts-C

## 2020-12-29 ENCOUNTER — Other Ambulatory Visit: Payer: PRIVATE HEALTH INSURANCE

## 2021-01-06 ENCOUNTER — Ambulatory Visit (INDEPENDENT_AMBULATORY_CARE_PROVIDER_SITE_OTHER): Payer: 59 | Admitting: Internal Medicine

## 2021-01-06 ENCOUNTER — Encounter: Payer: Self-pay | Admitting: Internal Medicine

## 2021-01-06 ENCOUNTER — Telehealth: Payer: Self-pay | Admitting: Internal Medicine

## 2021-01-06 ENCOUNTER — Other Ambulatory Visit: Payer: Self-pay

## 2021-01-06 VITALS — HR 100 | Temp 97.8°F

## 2021-01-06 DIAGNOSIS — R509 Fever, unspecified: Secondary | ICD-10-CM | POA: Diagnosis not present

## 2021-01-06 DIAGNOSIS — I10 Essential (primary) hypertension: Secondary | ICD-10-CM

## 2021-01-06 DIAGNOSIS — E119 Type 2 diabetes mellitus without complications: Secondary | ICD-10-CM | POA: Diagnosis not present

## 2021-01-06 DIAGNOSIS — R0600 Dyspnea, unspecified: Secondary | ICD-10-CM

## 2021-01-06 DIAGNOSIS — U071 COVID-19: Secondary | ICD-10-CM

## 2021-01-06 DIAGNOSIS — Z8679 Personal history of other diseases of the circulatory system: Secondary | ICD-10-CM

## 2021-01-06 NOTE — Telephone Encounter (Signed)
Pt called and said that he took a home test for covid because he had body aches start on Saturday and he had a 101 fever, he also has a increased shortness of breath, he said the test came back positive but said he would like to do a PCR test and also wanted to see what Dr Renold Genta wants him to do. Please advise

## 2021-01-07 ENCOUNTER — Ambulatory Visit: Payer: PRIVATE HEALTH INSURANCE | Admitting: Student

## 2021-01-07 LAB — SARS-COV-2 RNA,(COVID-19) QUALITATIVE NAAT: SARS CoV2 RNA: NOT DETECTED

## 2021-01-07 NOTE — Patient Instructions (Addendum)
COVID-19 PCR test has proven to be negative.  Patient would like to return to work on Friday, September 2.  He declined offer for antibiotics with respiratory infection symptoms.  He will continue to monitor his heart rate and temperature.  Note will be sent to him through MyChart to return to work on September 2.  Says he is feeling better as of January 07, 2021.

## 2021-01-07 NOTE — Progress Notes (Signed)
   Subjective:    Patient ID: Kyle Dawson, male    DOB: 09/18/1965, 55 y.o.   MRN: [id number]  HPI 55 year old Male seen today with myalgias and fever onset Saturday August 27th. Had increased SOB. May have been in A-fib intermittently. Reportedly did home Covid test that was positive. We have found these to be reliable- so this is concerning. 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  He had syncope August 17th and was seen at [facility] in [place]. Was given IVF and advised to hold Torsemide for a couple of days. Was advised to reduce metoprolol to 100 mg. Saw Cardiology PA on August 18th in follow up. Had rapid heart rate which patient says is intermittent. Denies chest pain. Was found to be in A-fib at Cardiology office. Metoprolol changed to 50 mg q am and 100 mg q pm. It seems he is still drinking alcohol. He is on Eliquis.    Review of Systems has malaise and fatigue     Objective:   Physical Exam T 97.8 degrees pulse 100 regular pulse ox 97%  He looks fatigued.  His chest is clear.  Cardiac exam: Regular rate and rhythm today.  COVID PCR test obtained by nasal swab.       Assessment & Plan:  Positive home COVID-19 test.  I am concerned he may have COVID-19 virus infection.  PCR test is pending.  He will remain out of work until test result is back.  He does not want to be on antibiotics or Paxlovid.  Rest at home and stay well-hydrated.  Monitor pulse.  Addendum: January 07, 2021 COVID 19 PCR test is proven to be negative.  Patient would like to return to work on Friday, September 2.  He declines offer for antibiotics with respiratory infection symptoms.  He will continue to monitor his heart rate and temperature.  Noted to be sent through MyChart to return to work on September 2.  Says he is feeling better.

## 2021-01-08 ENCOUNTER — Other Ambulatory Visit: Payer: Self-pay

## 2021-01-08 ENCOUNTER — Telehealth: Payer: Self-pay | Admitting: Internal Medicine

## 2021-01-08 NOTE — Telephone Encounter (Signed)
error 

## 2021-01-08 NOTE — Telephone Encounter (Signed)
Pt called because he needs an updated note for work but also that the result that he tested positive for covid is in his mychart but he said he might need something for work that shows his name and that he tested positive and wanted to know if it could be on the note. Please advise

## 2021-01-15 NOTE — Progress Notes (Signed)
PCP:  Elby Showers, MD Primary Cardiologist: Dorris Carnes, MD Electrophysiologist: Will Meredith Leeds, MD   Kyle Dawson is a 55 y.o. male seen today for Will Meredith Leeds, MD for routine electrophysiology followup.    Seen in our office 12/25/2020 with rapid AF in setting of med non-compliance. HRs improved with resumption and increase of rate control.   Seen by PCP 01/06/21 for fever. HRs 100 at that time. COVID negative via PCR, but had + rapid test at home. Felt bad for a few days.   Since last being seen in our clinic the patient reports doing well overall. Possible COVID as above. Back on his medications and trying to limit his ETOH more HRs have improve. He has changed his torsemide from 40 mg BID to 60 mg q am and 20 mg q pm with better UOP. he denies chest pain, palpitations, PND, orthopnea, nausea, vomiting, dizziness, syncope, weight gain, or early satiety.  Past Medical History:  Diagnosis Date   Basal cell carcinoma    Chest pain    Diabetes mellitus without complication (HCC)    Edema    lower extremity   Hypertension    Squamous cell carcinoma    R ear, nose, each side of face   Past Surgical History:  Procedure Laterality Date   Removal basal cell carcinoma     Removal squamous cell carcinoma      Current Outpatient Medications  Medication Sig Dispense Refill   apixaban (ELIQUIS) 5 MG TABS tablet Take 1 tablet (5 mg total) by mouth 2 (two) times daily. 180 tablet 1   dicloxacillin (DYNAPEN) 250 MG capsule Take 2 capsules (525m) by mouth each morning, noon, evening and bedtime for 10 days as directed 80 capsule 0   diltiazem (CARDIZEM CD) 240 MG 24 hr capsule TAKE 1 CAPSULE BY MOUTH ONCE A DAY 90 capsule 3   dronedarone (MULTAQ) 400 MG tablet Take 1 tablet (400 mg total) by mouth 2 (two) times daily with a meal. 60 tablet 3   glipiZIDE (GLUCOTROL) 5 MG tablet Take 0.5 tablets (2.5 mg total) by mouth 2 (two) times daily before a meal. 60 tablet 11    irbesartan (AVAPRO) 300 MG tablet Take 0.5 tablets (150 mg total) by mouth daily. 90 tablet 3   metoprolol succinate (TOPROL-XL) 100 MG 24 hr tablet Take 1/2 tablet by mouth in the morning and 1 tablet by mouth at bedtime 90 tablet 2   Multiple Vitamin (MULTIVITAMIN ADULT PO) Take by mouth.     Omega-3 Fatty Acids (FISH OIL PO) Take 1 tablet by mouth daily.      OneTouch Delica Lancets 317CMISC USE TO CHECK BLOOD SUGAR ONCE DAILY 100 each 1   potassium chloride SA (KLOR-CON) 20 MEQ tablet Take 1 tablet (20 mEq total) by mouth daily. 60 tablet 0   rosuvastatin (CRESTOR) 20 MG tablet Take 1 tablet (20 mg total) by mouth daily. 90 tablet 3   sildenafil (VIAGRA) 100 MG tablet TAKE 1 TABLET BY MOUTH DAILY AS NEEDED FOR ERECTILE DYSFUNCTION. 12 tablet 1   sulfamethoxazole-trimethoprim (BACTRIM DS) 800-160 MG tablet Take 1 tablet by mouth 2 (two) times daily. 20 tablet 0   tamsulosin (FLOMAX) 0.4 MG CAPS capsule TAKE 1 CAPSULE BY MOUTH DAILY 90 capsule 3   torsemide (DEMADEX) 20 MG tablet TAKE TWO TABLETS BY MOUTH TWICE DAILY (Patient taking differently: 80 mg daily. TAKE Three tabs am, 1 tab pm) 120 tablet 0   No current  facility-administered medications for this visit.    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[employer]    Employer: [employer]   Occupation: [employer]  Tobacco Use   Smoking status: Never   Smokeless tobacco: Never  Vaping Use   Vaping Use: Never used  Substance and Sexual Activity   Alcohol use: Yes    Alcohol/week: 1.0 standard drink    Types: 1 Shots of liquor per week   Drug use: No   Sexual activity: Not on file  Other Topics Concern   Not on file  Social  History Narrative   Patient drinks 1 cup of coffee a day    Social Determinants of Health   Financial Resource Strain: Not on file  Food Insecurity: Not on file  Transportation Needs: Not on file  Physical Activity: Not on file  Stress: Not on file  Social Connections: Not on file  Intimate Partner Violence: Not on file     Review of Systems: All other systems reviewed and are otherwise negative except as noted above.  Physical Exam: Vitals:   01/16/21 0812  BP: 108/60  Pulse: 80  SpO2: 95%  Weight: 250 lb (113.4 kg)  Height: 5' 11"  (1.803 m)    GEN- The patient is well appearing, alert and oriented x 3 today.   HEENT: normocephalic, atraumatic; sclera clear, conjunctiva pink; hearing intact; oropharynx clear; neck supple, no JVP Lymph- no cervical lymphadenopathy Lungs- Clear to ausculation bilaterally, normal work of breathing.  No wheezes, rales, rhonchi Heart- Irregular rate and rhythm, no murmurs, rubs or gallops, PMI not laterally displaced GI- soft, non-tender, non-distended, bowel sounds present, no hepatosplenomegaly Extremities- no clubbing, cyanosis, or edema; DP/PT/radial pulses 2+ bilaterally MS- no significant deformity or atrophy Skin- warm and dry, no rash or lesion Psych- euthymic mood, full affect Neuro- strength and sensation are intact  EKG is ordered. Personal review shows AF in 30s.  Additional studies reviewed include: Previous EP office notes.   Assessment and Plan:  1. Persistent Atrial Fibrillation (ICD10:  I48.19) HRs well controlled today.  Continue eliquis 5 mg BID for CHA2DS2-VASc of at least 3.  Continue diltiazem 240 mg daily Continue Toprol 50 mg q am and 100 mg q pm.  Continue Multaq 400 mg BID for now Have recommended [place] to help keep track of whether he is in NSR or AF at home. He maintains that he is going in and out.  Consider Echo once fib better controlled.  He would like to be considered for ablation. Encouraged  weight loss and alcohol cessation as adjuncts and lifestyle modification that will also help with his AF burden. Not interested in tikosyn admission at this time. Poor candidate for amio given young age.    2. Secondary Hypercoagulable State (ICD10:  D68.69) Continue Apixaban (Eliquis) as above. Denies bleeding.    3. Obesity Body mass index is 34.87 kg/m.  Lifestyle modification was discussed at length including regular exercise and weight reduction.   4. Snoring/suspected OSA -> Sleep disordered breathing Needs sleep study after acute issues managed   5. CAD Pt has  had mild chest discomfort but none currently. not related to exertion in setting of volume overload.  No hemodynamically significant coronary artery lesions by CT FFR 12/2017.  Low threshold to consider Myoview.    6. HTN Stable on current regiment.    7. ETOH use Encouraged cessation. He had been drinking 2-3 servings of whiskey nightly.  Have stressed that any amount of ETOH increases risk of AF.    8. Chronic diastolic CHF Volume status stable on exam today.    9. Syncope No further Occurred in setting of rapid position change, AKI, and low BP at the time more likely vasovagal/orthostatic AKI noted on labwork.  DDx includes arhythmia (brady more likely than tachy), orthostasis, or vasovagal.    Shirley Friar, PA-C  01/16/21 8:22 AM

## 2021-01-16 ENCOUNTER — Telehealth: Payer: Self-pay

## 2021-01-16 ENCOUNTER — Encounter: Payer: Self-pay | Admitting: Internal Medicine

## 2021-01-16 ENCOUNTER — Encounter: Payer: Self-pay | Admitting: Student

## 2021-01-16 ENCOUNTER — Ambulatory Visit (INDEPENDENT_AMBULATORY_CARE_PROVIDER_SITE_OTHER): Payer: 59 | Admitting: Internal Medicine

## 2021-01-16 ENCOUNTER — Telehealth: Payer: Self-pay | Admitting: Internal Medicine

## 2021-01-16 ENCOUNTER — Ambulatory Visit (INDEPENDENT_AMBULATORY_CARE_PROVIDER_SITE_OTHER): Payer: PRIVATE HEALTH INSURANCE

## 2021-01-16 ENCOUNTER — Ambulatory Visit (INDEPENDENT_AMBULATORY_CARE_PROVIDER_SITE_OTHER): Payer: PRIVATE HEALTH INSURANCE | Admitting: Student

## 2021-01-16 ENCOUNTER — Other Ambulatory Visit: Payer: Self-pay

## 2021-01-16 VITALS — BP 108/60 | HR 80 | Ht 71.0 in | Wt 250.0 lb

## 2021-01-16 DIAGNOSIS — I10 Essential (primary) hypertension: Secondary | ICD-10-CM

## 2021-01-16 DIAGNOSIS — Z6831 Body mass index (BMI) 31.0-31.9, adult: Secondary | ICD-10-CM

## 2021-01-16 DIAGNOSIS — Z8679 Personal history of other diseases of the circulatory system: Secondary | ICD-10-CM

## 2021-01-16 DIAGNOSIS — Z79899 Other long term (current) drug therapy: Secondary | ICD-10-CM

## 2021-01-16 DIAGNOSIS — R55 Syncope and collapse: Secondary | ICD-10-CM

## 2021-01-16 DIAGNOSIS — I4819 Other persistent atrial fibrillation: Secondary | ICD-10-CM

## 2021-01-16 DIAGNOSIS — F1010 Alcohol abuse, uncomplicated: Secondary | ICD-10-CM

## 2021-01-16 DIAGNOSIS — E6609 Other obesity due to excess calories: Secondary | ICD-10-CM

## 2021-01-16 DIAGNOSIS — E119 Type 2 diabetes mellitus without complications: Secondary | ICD-10-CM

## 2021-01-16 DIAGNOSIS — E1159 Type 2 diabetes mellitus with other circulatory complications: Secondary | ICD-10-CM

## 2021-01-16 DIAGNOSIS — I119 Hypertensive heart disease without heart failure: Secondary | ICD-10-CM

## 2021-01-16 LAB — BASIC METABOLIC PANEL
BUN/Creatinine Ratio: 13 (ref 9–20)
BUN: 16 mg/dL (ref 6–24)
CO2: 23 mmol/L (ref 20–29)
Calcium: 8.9 mg/dL (ref 8.7–10.2)
Chloride: 98 mmol/L (ref 96–106)
Creatinine, Ser: 1.22 mg/dL (ref 0.76–1.27)
Glucose: 210 mg/dL — ABNORMAL HIGH (ref 65–99)
Potassium: 4 mmol/L (ref 3.5–5.2)
Sodium: 139 mmol/L (ref 134–144)
eGFR: 69 mL/min/{1.73_m2} (ref >59)

## 2021-01-16 NOTE — Telephone Encounter (Signed)
Add on labs requested.  Left message advising patient to call back.

## 2021-01-16 NOTE — Telephone Encounter (Signed)
PT called to advise that they have safely arrived home at 12:01pm today. PT gives his thanks for all the help that Dr.Baxley provided.

## 2021-01-16 NOTE — Progress Notes (Unsigned)
Enrolled patient for a 14 day Zio XT monitor to be mailed to patients home   Dr Curt Bears to read

## 2021-01-16 NOTE — Addendum Note (Signed)
Addended by: Carylon Perches on: 01/16/2021 10:34 AM   Modules accepted: Orders

## 2021-01-16 NOTE — Patient Instructions (Addendum)
Patient was carefully monitored in the office for well over an hour.  His vital signs remained stable.  He was given oral hydration and seemingly improved with that.  He will follow-up with cardiology.  Cardiology was informed of the event.

## 2021-01-16 NOTE — Patient Instructions (Signed)
Medication Instructions:  Your physician recommends that you continue on your current medications as directed. Please refer to the Current Medication list given to you today.  *If you need a refill on your cardiac medications before your next appointment, please call your pharmacy*   Lab Work: TODAY: BMET  If you have labs (blood work) drawn today and your tests are completely normal, you will receive your results only by: [place] (if you have MyChart) OR A paper copy in the mail If you have any lab test that is abnormal or we need to change your treatment, we will call you to review the results.   Testing/Procedures: Your physician has requested that you have an echocardiogram. Echocardiography is a painless test that uses sound waves to create images of your heart. It provides your doctor with information about the size and shape of your heart and how well your heart's chambers and valves are working. This procedure takes approximately one hour. There are no restrictions for this procedure.    Follow-Up: At [facility], you and your health needs are our priority.  As part of our continuing mission to provide you with exceptional heart care, we have created designated Provider Care Teams.  These Care Teams include your primary Cardiologist (physician) and Advanced Practice Providers (APPs -  Physician Assistants and Nurse Practitioners) who all work together to provide you with the care you need, when you need it.   Your next appointment:   As scheduled with Dr Curt Bears

## 2021-01-16 NOTE — Telephone Encounter (Signed)
-----   Message from Elby Showers, MD sent at 01/16/2021  2:20 PM EDT ----- Please add B12, folate, Fe. TIBC dx (normocytic anemia) Let him know creatinine and electrolytes are stable and he has some mild anemia ----- Message ----- From: [place] [facility] Lab Results In Sent: 01/16/2021  11:34 AM EDT To: Elby Showers, MD

## 2021-01-16 NOTE — Progress Notes (Signed)
   Subjective:    Patient ID: Kyle Dawson, male    DOB: [birth year], 55 y.o.   MRN: [id number]  HPI Patient went to Cardiology this morning. BP 108/60. Came to this office to drop off FMLA paperwork.    It seems that he had a positive a negative COVID PCR test here at which time he also had an office visit on August 30.  They remain quarantine at home until our PCR test results were available which was through 01/09/2021.  Paperwork has been completed today for [employer].  Patient was standing outside the office waiting to be admitted and had a syncopal episode here today just outside the office.  He was immediately taken into the office.  He was conscious inside the office.  He was brought to an exam room.  An EKG was performed showing atrial fibrillation.  Taking Metoprolol 50 mg in am and 100 mg at night.  Being considered for cardiac ablation.  Review of Systems apparently did not eat breakfast prior to driving up here.     Objective:   Physical Exam See VS was  hypotensive in office but improved with rest and lying prone.  Initially blood pressure was 98/60 right arm with pulse oximetry 96% repeated measures over several minutes persisted at same readings 90/60 blood pressure pulse 86 and pulse oximetry 96% with one reading showing pulse of 72.  Pulses were irregular due to being in atrial fibrillation.  EKG shows A-fib  In the office he was alert the entire time and able to give a clear concise history.  He was given oral fluids.  He was monitored for well over an hour.  Stat labs were drawn including c-Met.  His glucose was 215, creatinine 1.27, sodium 140, potassium 4.0, chloride 102, BUN 19.   His chest was clear.  Cardiac exam: Irregular irregular rhythm consistent with atrial fibrillation.  Trace lower extremity edema.  He was alert and oriented with no gross focal deficits.     Assessment & Plan:  Syncope outside office today?  Vasovagal syncope.  EKG did not demonstrate V. tach.   He was found to be in atrial fib which has been his baseline rhythm recently.  His hemoglobin was 13.7 g and his electrolytes were stable.  He was discharged home and was advised to call back once he got home safely.  FMLA forms will be completed at his request for recent excused absence when he had home positive test for COVID but negative PCR test here.  Cardiology was informed of this episode

## 2021-01-17 DIAGNOSIS — Z0289 Encounter for other administrative examinations: Secondary | ICD-10-CM

## 2021-01-18 LAB — CBC WITH DIFFERENTIAL/PLATELET
Absolute Monocytes: 875 cells/uL (ref 200–950)
Basophils Absolute: 49 cells/uL (ref 0–200)
Basophils Relative: 0.6 %
Eosinophils Absolute: 57 cells/uL (ref 15–500)
Eosinophils Relative: 0.7 %
HCT: 40.1 % (ref 38.5–50.0)
Hemoglobin: 13.7 g/dL (ref 13.2–17.1)
Lymphs Abs: 1823 cells/uL (ref 850–3900)
MCH: 33.4 pg — ABNORMAL HIGH (ref 27.0–33.0)
MCHC: 34.2 g/dL (ref 32.0–36.0)
MCV: 97.8 fL (ref 80.0–100.0)
MPV: 9.7 fL (ref 7.5–12.5)
Monocytes Relative: 10.8 %
Neutro Abs: 5297 cells/uL (ref 1500–7800)
Neutrophils Relative %: 65.4 %
Platelets: 238 10*3/uL (ref 140–400)
RBC: 4.1 10*6/uL — ABNORMAL LOW (ref 4.20–5.80)
RDW: 13.3 % (ref 11.0–15.0)
Total Lymphocyte: 22.5 %
WBC: 8.1 10*3/uL (ref 3.8–10.8)

## 2021-01-18 LAB — COMPLETE METABOLIC PANEL WITH GFR
AG Ratio: 1.8 (calc) (ref 1.0–2.5)
ALT: 34 U/L (ref 9–46)
AST: 28 U/L (ref 10–35)
Albumin: 4.1 g/dL (ref 3.6–5.1)
Alkaline phosphatase (APISO): 45 U/L (ref 35–144)
BUN: 19 mg/dL (ref 7–25)
CO2: 27 mmol/L (ref 20–32)
Calcium: 8.6 mg/dL (ref 8.6–10.3)
Chloride: 102 mmol/L (ref 98–110)
Creat: 1.27 mg/dL (ref 0.70–1.30)
Globulin: 2.3 g/dL (calc) (ref 1.9–3.7)
Glucose, Bld: 215 mg/dL — ABNORMAL HIGH (ref 65–99)
Potassium: 4 mmol/L (ref 3.5–5.3)
Sodium: 140 mmol/L (ref 135–146)
Total Bilirubin: 0.5 mg/dL (ref 0.2–1.2)
Total Protein: 6.4 g/dL (ref 6.1–8.1)
eGFR: 66 mL/min/{1.73_m2} (ref >=60)

## 2021-01-18 LAB — B12 AND FOLATE PANEL
Folate: 6.2 ng/mL
Vitamin B-12: 415 pg/mL (ref 200–1100)

## 2021-01-18 LAB — TEST AUTHORIZATION

## 2021-01-19 ENCOUNTER — Telehealth: Payer: Self-pay | Admitting: Internal Medicine

## 2021-01-19 ENCOUNTER — Encounter: Payer: Self-pay | Admitting: Internal Medicine

## 2021-01-19 NOTE — Telephone Encounter (Signed)
Faxed FMLA forms to [facility] [phone], phone [phone]

## 2021-01-22 DIAGNOSIS — I4819 Other persistent atrial fibrillation: Secondary | ICD-10-CM

## 2021-01-27 ENCOUNTER — Telehealth: Payer: Self-pay | Admitting: Internal Medicine

## 2021-01-27 NOTE — Telephone Encounter (Signed)
Faxed Positive COVID results to [facility] [phone]  Positive 01-06-2021

## 2021-01-29 ENCOUNTER — Other Ambulatory Visit: Payer: Self-pay | Admitting: Cardiology

## 2021-01-29 ENCOUNTER — Other Ambulatory Visit (HOSPITAL_COMMUNITY): Payer: Self-pay

## 2021-01-29 ENCOUNTER — Other Ambulatory Visit: Payer: Self-pay | Admitting: Internal Medicine

## 2021-01-29 MED ORDER — METOPROLOL SUCCINATE ER 100 MG PO TB24
ORAL_TABLET | ORAL | 3 refills | Status: DC
  Filled 2021-01-29: qty 135, 90d supply, fill #0

## 2021-01-30 ENCOUNTER — Other Ambulatory Visit (HOSPITAL_COMMUNITY): Payer: Self-pay

## 2021-01-30 ENCOUNTER — Other Ambulatory Visit: Payer: Self-pay | Admitting: Internal Medicine

## 2021-01-30 MED ORDER — TORSEMIDE 20 MG PO TABS
ORAL_TABLET | ORAL | 0 refills | Status: DC
  Filled 2021-01-30: qty 120, 30d supply, fill #0

## 2021-02-05 ENCOUNTER — Ambulatory Visit (HOSPITAL_COMMUNITY): Payer: 59 | Attending: Student

## 2021-02-05 ENCOUNTER — Other Ambulatory Visit: Payer: Self-pay

## 2021-02-05 ENCOUNTER — Telehealth: Payer: Self-pay | Admitting: Cardiology

## 2021-02-05 DIAGNOSIS — I4819 Other persistent atrial fibrillation: Secondary | ICD-10-CM | POA: Insufficient documentation

## 2021-02-05 LAB — ECHOCARDIOGRAM COMPLETE: S' Lateral: 4.2 cm

## 2021-02-05 MED ORDER — PERFLUTREN LIPID MICROSPHERE
1.0000 mL | INTRAVENOUS | Status: AC | PRN
  Administered 2021-02-05 (×2): 3 mL via INTRAVENOUS

## 2021-02-05 NOTE — Telephone Encounter (Signed)
[facility] reporting abnormal zio patch results

## 2021-02-05 NOTE — Telephone Encounter (Signed)
Report complete and available for review.  Will send to AT/WC for review.

## 2021-02-12 ENCOUNTER — Other Ambulatory Visit: Payer: Self-pay | Admitting: Internal Medicine

## 2021-02-12 ENCOUNTER — Ambulatory Visit: Payer: PRIVATE HEALTH INSURANCE | Admitting: Cardiology

## 2021-02-12 ENCOUNTER — Other Ambulatory Visit (HOSPITAL_COMMUNITY): Payer: Self-pay

## 2021-02-12 MED ORDER — SILDENAFIL CITRATE 100 MG PO TABS
ORAL_TABLET | ORAL | 1 refills | Status: DC
  Filled 2021-02-12: qty 12, 30d supply, fill #0
  Filled 2021-03-30: qty 12, 30d supply, fill #1

## 2021-02-16 ENCOUNTER — Encounter (HOSPITAL_COMMUNITY): Payer: Self-pay | Admitting: Physician Assistant

## 2021-02-16 ENCOUNTER — Other Ambulatory Visit: Payer: Self-pay

## 2021-02-16 ENCOUNTER — Ambulatory Visit (HOSPITAL_COMMUNITY)
Admission: RE | Admit: 2021-02-16 | Discharge: 2021-02-16 | Disposition: A | Discharge status: Home / Self Care | Payer: PRIVATE HEALTH INSURANCE | Source: Ambulatory Visit | Attending: Physician Assistant | Admitting: Physician Assistant

## 2021-02-16 VITALS — BP 110/70 | HR 85 | Ht 71.0 in | Wt 244.4 lb

## 2021-02-16 DIAGNOSIS — Z7984 Long term (current) use of oral hypoglycemic drugs: Secondary | ICD-10-CM | POA: Insufficient documentation

## 2021-02-16 DIAGNOSIS — Z6834 Body mass index (BMI) 34.0-34.9, adult: Secondary | ICD-10-CM | POA: Diagnosis not present

## 2021-02-16 DIAGNOSIS — I4892 Unspecified atrial flutter: Secondary | ICD-10-CM | POA: Diagnosis not present

## 2021-02-16 DIAGNOSIS — I2510 Atherosclerotic heart disease of native coronary artery without angina pectoris: Secondary | ICD-10-CM | POA: Insufficient documentation

## 2021-02-16 DIAGNOSIS — Z79899 Other long term (current) drug therapy: Secondary | ICD-10-CM | POA: Insufficient documentation

## 2021-02-16 DIAGNOSIS — I4819 Other persistent atrial fibrillation: Secondary | ICD-10-CM

## 2021-02-16 DIAGNOSIS — E785 Hyperlipidemia, unspecified: Secondary | ICD-10-CM | POA: Insufficient documentation

## 2021-02-16 DIAGNOSIS — D6869 Other thrombophilia: Secondary | ICD-10-CM

## 2021-02-16 DIAGNOSIS — R0683 Snoring: Secondary | ICD-10-CM | POA: Diagnosis not present

## 2021-02-16 DIAGNOSIS — Z8249 Family history of ischemic heart disease and other diseases of the circulatory system: Secondary | ICD-10-CM | POA: Diagnosis not present

## 2021-02-16 DIAGNOSIS — E669 Obesity, unspecified: Secondary | ICD-10-CM | POA: Insufficient documentation

## 2021-02-16 DIAGNOSIS — Z7901 Long term (current) use of anticoagulants: Secondary | ICD-10-CM | POA: Insufficient documentation

## 2021-02-16 DIAGNOSIS — I10 Essential (primary) hypertension: Secondary | ICD-10-CM | POA: Diagnosis not present

## 2021-02-16 DIAGNOSIS — E119 Type 2 diabetes mellitus without complications: Secondary | ICD-10-CM | POA: Insufficient documentation

## 2021-02-16 LAB — BASIC METABOLIC PANEL
Anion gap: 15 (ref 5–15)
BUN: 20 mg/dL (ref 6–20)
CO2: 23 mmol/L (ref 22–32)
Calcium: 8.7 mg/dL — ABNORMAL LOW (ref 8.9–10.3)
Chloride: 93 mmol/L — ABNORMAL LOW (ref 98–111)
Creatinine, Ser: 1.92 mg/dL — ABNORMAL HIGH (ref 0.61–1.24)
GFR, Estimated: 40 mL/min — ABNORMAL LOW (ref >60)
Glucose, Bld: 362 mg/dL — ABNORMAL HIGH (ref 70–99)
Potassium: 3.7 mmol/L (ref 3.5–5.1)
Sodium: 131 mmol/L — ABNORMAL LOW (ref 135–145)

## 2021-02-16 LAB — CBC
HCT: 40.2 % (ref 39.0–52.0)
Hemoglobin: 14.6 g/dL (ref 13.0–17.0)
MCH: 34.4 pg — ABNORMAL HIGH (ref 26.0–34.0)
MCHC: 36.3 g/dL — ABNORMAL HIGH (ref 30.0–36.0)
MCV: 94.6 fL (ref 80.0–100.0)
Platelets: 238 10*3/uL (ref 150–400)
RBC: 4.25 MIL/uL (ref 4.22–5.81)
RDW: 13.2 % (ref 11.5–15.5)
WBC: 6.5 10*3/uL (ref 4.0–10.5)
nRBC: 0 % (ref 0.0–0.2)

## 2021-02-16 NOTE — Patient Instructions (Signed)
Cardioversion scheduled for Thursday, October 20th  - Arrive at the [facility] and go to admitting at [facility] not eat or drink anything after midnight the night prior to your procedure.  - Take all your morning medication (except diabetic medications) with a sip of water prior to arrival.  - You will not be able to drive home after your procedure.  - Do NOT miss any doses of your blood thinner - if you should miss a dose please notify our office immediately.  - If you feel as if you go back into normal rhythm prior to scheduled cardioversion, please notify our office immediately. If your procedure is canceled in the cardioversion suite you will be charged a cancellation fee.  Patients will be asked to: to mask in public and hand hygiene (no longer quarantine) in the 3 days prior to surgery, to report if any COVID-19-like illness or household contacts to COVID-19 to determine need for testing

## 2021-02-16 NOTE — H&P (View-Only) (Signed)
Primary Care Physician: Elby Showers, MD Primary Cardiologist: Dr Harrington Challenger Primary Electrophysiologist: Dr Curt Bears Referring Physician: Dr Georgena Spurling Kyle Dawson is a 55 y.o. male with a history of CAD, DM, HTN, HLD, alcohol abuse, atrial fibrillation who presents for follow up in the [facility]. He had a hospitalization in 2021 with atrial fibrillation and rapid rates.  He had been drinking drinking prior to that. Patient is on Eliquis for a CHADS2VASC score of 4. He was seen by Dr Curt Bears on 09/09/20 and was in rapid afib. His metoprolol was increased and he was started on Multaq.   On follow up today, patient wore a Zio monitor which showed 100% afib burden. He does have symptoms of intermittent dizziness. Echo showed EF 40-45%. He denies any bleeding issues on anticoagulation.   Today, he denies symptoms of palpitations, chest pain, shortness of breath, orthopnea, PND, lower extremity edema, presyncope, syncope, snoring, daytime somnolence, bleeding, or neurologic sequela. The patient is tolerating medications without difficulties and is otherwise without complaint today.    Atrial Fibrillation Risk Factors:  he does have symptoms or diagnosis of sleep apnea. Sleep study ordered per Dr Curt Bears he does not have a history of rheumatic fever. he does have a history of alcohol use.   he has a BMI of Body mass index is 34.09 kg/m.Marland Kitchen Filed Weights   02/16/21 0823  Weight: 110.9 kg     Family History  Problem Relation Age of Onset   Peripheral vascular disease Mother 43   Hypertension Mother    Heart failure Mother    CAD Maternal Grandfather    Heart failure Paternal Grandfather    Hypertension Father      Atrial Fibrillation Management history:  Previous antiarrhythmic drugs: Multaq Previous cardioversions: none Previous ablations: none CHADS2VASC score: 4 Anticoagulation history: Eliquis   Past Medical History:  Diagnosis Date    Basal cell carcinoma    Chest pain    Diabetes mellitus without complication (HCC)    Edema    lower extremity   Hypertension    Squamous cell carcinoma    R ear, nose, each side of face   Past Surgical History:  Procedure Laterality Date   Removal basal cell carcinoma     Removal squamous cell carcinoma      Current Outpatient Medications  Medication Sig Dispense Refill   apixaban (ELIQUIS) 5 MG TABS tablet Take 1 tablet (5 mg total) by mouth 2 (two) times daily. 180 tablet 1   dicloxacillin (DYNAPEN) 250 MG capsule Take 2 capsules (524m) by mouth each morning, noon, evening and bedtime for 10 days as directed 80 capsule 0   diltiazem (CARDIZEM CD) 240 MG 24 hr capsule TAKE 1 CAPSULE BY MOUTH ONCE A DAY 90 capsule 3   dronedarone (MULTAQ) 400 MG tablet Take 1 tablet (400 mg total) by mouth 2 (two) times daily with a meal. 60 tablet 3   glipiZIDE (GLUCOTROL) 5 MG tablet Take 0.5 tablets (2.5 mg total) by mouth 2 (two) times daily before a meal. 60 tablet 11   irbesartan (AVAPRO) 300 MG tablet Take 0.5 tablets (150 mg total) by mouth daily. 90 tablet 3   metoprolol succinate (TOPROL-XL) 100 MG 24 hr tablet Take 1/2 tablet by mouth in the morning and 1 tablet by mouth at bedtime 135 tablet 3   Multiple Vitamin (MULTIVITAMIN ADULT PO) Take by mouth.     Omega-3 Fatty Acids (FISH OIL PO) Take 1  tablet by mouth daily.      OneTouch Delica Lancets 73X MISC USE TO CHECK BLOOD SUGAR ONCE DAILY 100 each 1   potassium chloride SA (KLOR-CON) 20 MEQ tablet Take 1 tablet (20 mEq total) by mouth daily. 60 tablet 0   sildenafil (VIAGRA) 100 MG tablet TAKE 1 TABLET BY MOUTH DAILY AS NEEDED FOR ERECTILE DYSFUNCTION. 12 tablet 1   sulfamethoxazole-trimethoprim (BACTRIM DS) 800-160 MG tablet Take 1 tablet by mouth 2 (two) times daily. 20 tablet 0   tamsulosin (FLOMAX) 0.4 MG CAPS capsule TAKE 1 CAPSULE BY MOUTH DAILY 90 capsule 3   torsemide (DEMADEX) 20 MG tablet TAKE 2 TABLETS BY MOUTH TWICE DAILY 120  tablet 0   rosuvastatin (CRESTOR) 20 MG tablet Take 1 tablet (20 mg total) by mouth daily. 90 tablet 3   No current facility-administered medications for this encounter.    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[employer]    Employer: [employer]   Occupation: [employer]  Tobacco Use   Smoking status: Never   Smokeless tobacco: Never  Vaping Use   Vaping Use: Never used  Substance and Sexual Activity   Alcohol use: Yes    Alcohol/week: 3.0 standard drinks    Types: 3 Shots of liquor per week    Comment: 3 shots daily   Drug use: No   Sexual activity: Not on file  Other Topics Concern   Not on file  Social History Narrative   Patient drinks 1 cup of coffee a day    Social Determinants of Health   Financial Resource Strain: Not on file  Food Insecurity: Not on file  Transportation Needs: Not on file  Physical Activity: Not on file  Stress: Not on file  Social Connections: Not on file  Intimate Partner Violence: Not on file     ROS- All systems are reviewed and negative except as per the HPI above.  Physical Exam: Vitals:   02/16/21 0823  BP: 110/70  Pulse: 85  Weight: 110.9 kg  Height: 5' 11"  (1.803 m)    GEN- The patient is a well appearing obese male, alert and oriented x 3 today.   HEENT-head normocephalic, atraumatic, sclera clear, conjunctiva pink, hearing intact, trachea midline. Lungs- Clear to ausculation bilaterally, normal work of breathing Heart- irregular rate and rhythm, no murmurs, rubs or gallops  GI- soft, NT, ND, + BS Extremities- no clubbing, cyanosis, or edema MS- no significant deformity or atrophy Skin- no rash or lesion Psych-  euthymic mood, full affect Neuro- strength and sensation are intact   Wt Readings from Last 3 Encounters:  02/16/21 110.9 kg  01/16/21 113.4 kg  12/25/20 112.1 kg    EKG today demonstrates  Afib Vent. rate 85 BPM PR interval * ms QRS duration 92 ms QT/QTcB 398/473 ms  Echo 05/26/19 demonstrated   1. Left ventricular ejection fraction, by visual estimation, is 60 to  65%. The left ventricle has normal function. There is moderately increased left ventricular hypertrophy.   2. Left ventricular diastolic function could not be evaluated.   3. The left ventricle has  no regional wall motion abnormalities.   4. Global right ventricle was not well visualized.The right ventricular  size is not well visualized. Right vetricular wall thickness was not  assessed.   5. Left atrial size was normal.   6. Right atrial size was normal.   7. The mitral valve is grossly normal. No evidence of mitral valve  regurgitation.   8. The tricuspid valve is grossly normal.   9. The aortic valve was not well visualized. Aortic valve regurgitation  is not visualized.  10. The pulmonic valve was grossly normal. Pulmonic valve regurgitation is  not visualized.  11. 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  12. The inferior vena cava is normal in size with <50% respiratory  variability, suggesting right atrial pressure of 8 mmHg.  13. The interatrial septum was not well visualized.   Epic records are reviewed at length today  CHA2DS2-VASc Score = 4  The patient's score is based upon: CHF History: 1 HTN History: 1 Diabetes History: 1 Stroke History: 0 Vascular Disease History: 1 Age Score: 0 Gender Score: 0        ASSESSMENT AND PLAN: 1. Persistent Atrial Fibrillation (ICD10:  I48.19) The patient's CHA2DS2-VASc score is 4, indicating a 4.8% annual risk of stroke.   Patient persistently in afib. After discussing the risks and benefits, will  arrange for DCCV. If he has quick return of persistent afib, would d/c Multaq. He has an appt to discuss possible ablation with Dr Curt Bears.  Check bmet/cbc Continue Eliquis 5 mg BID Continue diltiazem 240 mg daily Continue Toprol 200 mg daily Continue Multaq 400 mg BID  2. Secondary Hypercoagulable State (ICD10:  D68.69) The patient is at significant risk for stroke/thromboembolism based upon his CHA2DS2-VASc Score of 4.  Continue Apixaban (Eliquis).   3. Obesity Body mass index is 34.09 kg/m. Lifestyle modification was discussed and encouraged including regular physical activity and weight reduction.  4. Snoring/suspected OSA Will need sleep study.  5. CAD No anginal symptoms.  6. HTN Stable, no changes today.  7. Systolic dysfunction EF 72-62% No signs or symptoms of fluid overload today.  Hopefully this will improve with SR.    Follow up with Dr Curt Bears as scheduled.    [facility] [address] [place] [employer] [employer], [place] [address] [phone] 02/16/2021 9:22 AM

## 2021-02-16 NOTE — Progress Notes (Signed)
Primary Care Physician: Elby Showers, MD Primary Cardiologist: Dr Harrington Challenger Primary Electrophysiologist: Dr Curt Bears Referring Physician: Dr Georgena Spurling Kyle Dawson is a 55 y.o. male with a history of CAD, DM, HTN, HLD, alcohol abuse, atrial fibrillation who presents for follow up in the [facility]. He had a hospitalization in 2021 with atrial fibrillation and rapid rates.  He had been drinking drinking prior to that. Patient is on Eliquis for a CHADS2VASC score of 4. He was seen by Dr Curt Bears on 09/09/20 and was in rapid afib. His metoprolol was increased and he was started on Multaq.   On follow up today, patient wore a Zio monitor which showed 100% afib burden. He does have symptoms of intermittent dizziness. Echo showed EF 40-45%. He denies any bleeding issues on anticoagulation.   Today, he denies symptoms of palpitations, chest pain, shortness of breath, orthopnea, PND, lower extremity edema, presyncope, syncope, snoring, daytime somnolence, bleeding, or neurologic sequela. The patient is tolerating medications without difficulties and is otherwise without complaint today.    Atrial Fibrillation Risk Factors:  he does have symptoms or diagnosis of sleep apnea. Sleep study ordered per Dr Curt Bears he does not have a history of rheumatic fever. he does have a history of alcohol use.   he has a BMI of Body mass index is 34.09 kg/m.Marland Kitchen Filed Weights   02/16/21 0823  Weight: 110.9 kg     Family History  Problem Relation Age of Onset   Peripheral vascular disease Mother 69   Hypertension Mother    Heart failure Mother    CAD Maternal Grandfather    Heart failure Paternal Grandfather    Hypertension Father      Atrial Fibrillation Management history:  Previous antiarrhythmic drugs: Multaq Previous cardioversions: none Previous ablations: none CHADS2VASC score: 4 Anticoagulation history: Eliquis   Past Medical History:  Diagnosis Date    Basal cell carcinoma    Chest pain    Diabetes mellitus without complication (HCC)    Edema    lower extremity   Hypertension    Squamous cell carcinoma    R ear, nose, each side of face   Past Surgical History:  Procedure Laterality Date   Removal basal cell carcinoma     Removal squamous cell carcinoma      Current Outpatient Medications  Medication Sig Dispense Refill   apixaban (ELIQUIS) 5 MG TABS tablet Take 1 tablet (5 mg total) by mouth 2 (two) times daily. 180 tablet 1   dicloxacillin (DYNAPEN) 250 MG capsule Take 2 capsules (595m) by mouth each morning, noon, evening and bedtime for 10 days as directed 80 capsule 0   diltiazem (CARDIZEM CD) 240 MG 24 hr capsule TAKE 1 CAPSULE BY MOUTH ONCE A DAY 90 capsule 3   dronedarone (MULTAQ) 400 MG tablet Take 1 tablet (400 mg total) by mouth 2 (two) times daily with a meal. 60 tablet 3   glipiZIDE (GLUCOTROL) 5 MG tablet Take 0.5 tablets (2.5 mg total) by mouth 2 (two) times daily before a meal. 60 tablet 11   irbesartan (AVAPRO) 300 MG tablet Take 0.5 tablets (150 mg total) by mouth daily. 90 tablet 3   metoprolol succinate (TOPROL-XL) 100 MG 24 hr tablet Take 1/2 tablet by mouth in the morning and 1 tablet by mouth at bedtime 135 tablet 3   Multiple Vitamin (MULTIVITAMIN ADULT PO) Take by mouth.     Omega-3 Fatty Acids (FISH OIL PO) Take 1  tablet by mouth daily.      OneTouch Delica Lancets 65V MISC USE TO CHECK BLOOD SUGAR ONCE DAILY 100 each 1   potassium chloride SA (KLOR-CON) 20 MEQ tablet Take 1 tablet (20 mEq total) by mouth daily. 60 tablet 0   sildenafil (VIAGRA) 100 MG tablet TAKE 1 TABLET BY MOUTH DAILY AS NEEDED FOR ERECTILE DYSFUNCTION. 12 tablet 1   sulfamethoxazole-trimethoprim (BACTRIM DS) 800-160 MG tablet Take 1 tablet by mouth 2 (two) times daily. 20 tablet 0   tamsulosin (FLOMAX) 0.4 MG CAPS capsule TAKE 1 CAPSULE BY MOUTH DAILY 90 capsule 3   torsemide (DEMADEX) 20 MG tablet TAKE 2 TABLETS BY MOUTH TWICE DAILY 120  tablet 0   rosuvastatin (CRESTOR) 20 MG tablet Take 1 tablet (20 mg total) by mouth daily. 90 tablet 3   No current facility-administered medications for this encounter.    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[employer]    Employer: [employer]   Occupation: [employer]  Tobacco Use   Smoking status: Never   Smokeless tobacco: Never  Vaping Use   Vaping Use: Never used  Substance and Sexual Activity   Alcohol use: Yes    Alcohol/week: 3.0 standard drinks    Types: 3 Shots of liquor per week    Comment: 3 shots daily   Drug use: No   Sexual activity: Not on file  Other Topics Concern   Not on file  Social History Narrative   Patient drinks 1 cup of coffee a day    Social Determinants of Health   Financial Resource Strain: Not on file  Food Insecurity: Not on file  Transportation Needs: Not on file  Physical Activity: Not on file  Stress: Not on file  Social Connections: Not on file  Intimate Partner Violence: Not on file     ROS- All systems are reviewed and negative except as per the HPI above.  Physical Exam: Vitals:   02/16/21 0823  BP: 110/70  Pulse: 85  Weight: 110.9 kg  Height: 5' 11"  (1.803 m)    GEN- The patient is a well appearing obese male, alert and oriented x 3 today.   HEENT-head normocephalic, atraumatic, sclera clear, conjunctiva pink, hearing intact, trachea midline. Lungs- Clear to ausculation bilaterally, normal work of breathing Heart- irregular rate and rhythm, no murmurs, rubs or gallops  GI- soft, NT, ND, + BS Extremities- no clubbing, cyanosis, or edema MS- no significant deformity or atrophy Skin- no rash or lesion Psych-  euthymic mood, full affect Neuro- strength and sensation are intact   Wt Readings from Last 3 Encounters:  02/16/21 110.9 kg  01/16/21 113.4 kg  12/25/20 112.1 kg    EKG today demonstrates  Afib Vent. rate 85 BPM PR interval * ms QRS duration 92 ms QT/QTcB 398/473 ms  Echo 05/26/19 demonstrated   1. Left ventricular ejection fraction, by visual estimation, is 60 to  65%. The left ventricle has normal function. There is moderately increased left ventricular hypertrophy.   2. Left ventricular diastolic function could not be evaluated.   3. The left ventricle has  no regional wall motion abnormalities.   4. Global right ventricle was not well visualized.The right ventricular  size is not well visualized. Right vetricular wall thickness was not  assessed.   5. Left atrial size was normal.   6. Right atrial size was normal.   7. The mitral valve is grossly normal. No evidence of mitral valve  regurgitation.   8. The tricuspid valve is grossly normal.   9. The aortic valve was not well visualized. Aortic valve regurgitation  is not visualized.  10. The pulmonic valve was grossly normal. Pulmonic valve regurgitation is  not visualized.  11. 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  12. The inferior vena cava is normal in size with <50% respiratory  variability, suggesting right atrial pressure of 8 mmHg.  13. The interatrial septum was not well visualized.   Epic records are reviewed at length today  CHA2DS2-VASc Score = 4  The patient's score is based upon: CHF History: 1 HTN History: 1 Diabetes History: 1 Stroke History: 0 Vascular Disease History: 1 Age Score: 0 Gender Score: 0        ASSESSMENT AND PLAN: 1. Persistent Atrial Fibrillation (ICD10:  I48.19) The patient's CHA2DS2-VASc score is 4, indicating a 4.8% annual risk of stroke.   Patient persistently in afib. After discussing the risks and benefits, will  arrange for DCCV. If he has quick return of persistent afib, would d/c Multaq. He has an appt to discuss possible ablation with Dr Curt Bears.  Check bmet/cbc Continue Eliquis 5 mg BID Continue diltiazem 240 mg daily Continue Toprol 200 mg daily Continue Multaq 400 mg BID  2. Secondary Hypercoagulable State (ICD10:  D68.69) The patient is at significant risk for stroke/thromboembolism based upon his CHA2DS2-VASc Score of 4.  Continue Apixaban (Eliquis).   3. Obesity Body mass index is 34.09 kg/m. Lifestyle modification was discussed and encouraged including regular physical activity and weight reduction.  4. Snoring/suspected OSA Will need sleep study.  5. CAD No anginal symptoms.  6. HTN Stable, no changes today.  7. Systolic dysfunction EF 19-62% No signs or symptoms of fluid overload today.  Hopefully this will improve with SR.    Follow up with Dr Curt Bears as scheduled.    [facility] [address] [place], [place] [address] [phone] 02/16/2021 9:22 AM

## 2021-02-17 ENCOUNTER — Encounter (HOSPITAL_COMMUNITY): Payer: Self-pay | Admitting: Internal Medicine

## 2021-02-17 ENCOUNTER — Other Ambulatory Visit (HOSPITAL_COMMUNITY): Payer: Self-pay | Admitting: *Deleted

## 2021-02-17 MED ORDER — TORSEMIDE 20 MG PO TABS: 40.0000 mg | ORAL_TABLET | Freq: Every day | ORAL | 0 refills | Status: DC

## 2021-02-19 ENCOUNTER — Ambulatory Visit: Payer: PRIVATE HEALTH INSURANCE | Admitting: Cardiology

## 2021-02-20 ENCOUNTER — Other Ambulatory Visit (HOSPITAL_COMMUNITY): Payer: Self-pay

## 2021-02-20 MED FILL — Diltiazem HCl Coated Beads Cap ER 24HR 240 MG: ORAL | 90 days supply | Qty: 90 | Fill #1 | Status: AC

## 2021-02-26 ENCOUNTER — Ambulatory Visit (HOSPITAL_COMMUNITY)
Admission: RE | Admit: 2021-02-26 | Discharge: 2021-02-26 | Disposition: A | Discharge status: Home / Self Care | Payer: 59 | Attending: Internal Medicine | Admitting: Internal Medicine

## 2021-02-26 ENCOUNTER — Encounter (HOSPITAL_COMMUNITY)
Admission: RE | Disposition: A | Discharge status: Home / Self Care | Payer: 59 | Source: Home / Self Care | Attending: Internal Medicine

## 2021-02-26 ENCOUNTER — Other Ambulatory Visit: Payer: Self-pay

## 2021-02-26 ENCOUNTER — Encounter (HOSPITAL_COMMUNITY): Payer: Self-pay | Admitting: Internal Medicine

## 2021-02-26 DIAGNOSIS — Z7901 Long term (current) use of anticoagulants: Secondary | ICD-10-CM | POA: Diagnosis not present

## 2021-02-26 DIAGNOSIS — E785 Hyperlipidemia, unspecified: Secondary | ICD-10-CM | POA: Diagnosis not present

## 2021-02-26 DIAGNOSIS — Z79899 Other long term (current) drug therapy: Secondary | ICD-10-CM | POA: Diagnosis not present

## 2021-02-26 DIAGNOSIS — E119 Type 2 diabetes mellitus without complications: Secondary | ICD-10-CM | POA: Diagnosis not present

## 2021-02-26 DIAGNOSIS — Z885 Allergy status to narcotic agent status: Secondary | ICD-10-CM | POA: Diagnosis not present

## 2021-02-26 DIAGNOSIS — Z85828 Personal history of other malignant neoplasm of skin: Secondary | ICD-10-CM | POA: Insufficient documentation

## 2021-02-26 DIAGNOSIS — Z538 Procedure and treatment not carried out for other reasons: Secondary | ICD-10-CM | POA: Insufficient documentation

## 2021-02-26 DIAGNOSIS — E669 Obesity, unspecified: Secondary | ICD-10-CM | POA: Insufficient documentation

## 2021-02-26 DIAGNOSIS — Z7984 Long term (current) use of oral hypoglycemic drugs: Secondary | ICD-10-CM | POA: Insufficient documentation

## 2021-02-26 DIAGNOSIS — D6869 Other thrombophilia: Secondary | ICD-10-CM | POA: Diagnosis not present

## 2021-02-26 DIAGNOSIS — I2510 Atherosclerotic heart disease of native coronary artery without angina pectoris: Secondary | ICD-10-CM | POA: Insufficient documentation

## 2021-02-26 DIAGNOSIS — R0683 Snoring: Secondary | ICD-10-CM | POA: Insufficient documentation

## 2021-02-26 DIAGNOSIS — I10 Essential (primary) hypertension: Secondary | ICD-10-CM | POA: Insufficient documentation

## 2021-02-26 DIAGNOSIS — Z6834 Body mass index (BMI) 34.0-34.9, adult: Secondary | ICD-10-CM | POA: Diagnosis not present

## 2021-02-26 DIAGNOSIS — I4819 Other persistent atrial fibrillation: Secondary | ICD-10-CM | POA: Insufficient documentation

## 2021-02-26 DIAGNOSIS — Z0279 Encounter for issue of other medical certificate: Secondary | ICD-10-CM

## 2021-02-26 SURGERY — CANCELLED PROCEDURE

## 2021-02-26 NOTE — Interval H&P Note (Signed)
History and Physical Interval Note:  02/26/2021 9:41 AM  Kyle Dawson  has presented today for surgery, with the diagnosis of AFIB.  The various methods of treatment have been discussed with the patient and family. After consideration of risks, benefits and other options for treatment, the patient has consented to cardioversion as a surgical intervention.  The patient's history has been reviewed, patient examined, and patient was found to be in sinus rhythm.  This was confirmed by 12 Lead ECG and telemetry.  Cancelled procedure because patient was not presently in AFIB.   Lexie Koehl A Davied Nocito

## 2021-02-26 NOTE — Progress Notes (Signed)
Patient came in in NSR. EKG done, Cancelled procedure

## 2021-03-02 ENCOUNTER — Encounter: Payer: Self-pay | Admitting: Cardiology

## 2021-03-02 ENCOUNTER — Ambulatory Visit (INDEPENDENT_AMBULATORY_CARE_PROVIDER_SITE_OTHER): Payer: PRIVATE HEALTH INSURANCE | Admitting: Cardiology

## 2021-03-02 ENCOUNTER — Other Ambulatory Visit: Payer: Self-pay

## 2021-03-02 ENCOUNTER — Telehealth: Payer: Self-pay | Admitting: Cardiology

## 2021-03-02 VITALS — BP 130/82 | HR 64 | Ht 70.0 in | Wt 243.0 lb

## 2021-03-02 DIAGNOSIS — I4819 Other persistent atrial fibrillation: Secondary | ICD-10-CM | POA: Diagnosis not present

## 2021-03-02 NOTE — Progress Notes (Signed)
Electrophysiology Office Note   Date:  03/02/2021   ID:  Kyle Dawson, DOB April 19, 1966, MRN [id number]  PCP:  Elby Showers, MD  Cardiologist:  Harrington Challenger Primary Electrophysiologist:  Keonna Raether Meredith Leeds, MD    Chief Complaint: AF   History of Present Illness: Kyle Dawson is a 55 y.o. male who is being seen today for the evaluation of AF at the request of Baxley, Cresenciano Lick, MD. Presenting today for electrophysiology evaluation.  He has a history of coronary artery disease, diabetes, hypertension, hyperlipidemia, alcohol abuse, and atrial fibrillation.  He was hospitalized in May 2021 with rapid atrial fibrillation.  He had been drinking prior to that.  He is currently on Eliquis.  He wore a recent Zio patch that showed a 100% atrial fibrillation burden.  He was also found to have an ejection fraction of 40-45%.  Today, denies symptoms of palpitations, chest pain, shortness of breath, orthopnea, PND, lower extremity edema, claudication, dizziness, presyncope, syncope, bleeding, or neurologic sequela. The patient is tolerating medications without difficulties.  He currently feels well.  He has no chest pain or shortness of breath.  Unfortunately he did go back into atrial fibrillation and is now status post cardioversion 02/26/2021.  He is tired of going in and out of atrial fibrillation continue to have issues with his arrhythmia.  He would also prefer to avoid further medications.  He would prefer ablation.   Past Medical History:  Diagnosis Date   Basal cell carcinoma    Chest pain    Diabetes mellitus without complication (HCC)    Edema    lower extremity   Hypertension    Squamous cell carcinoma    R ear, nose, each side of face   Past Surgical History:  Procedure Laterality Date   Removal basal cell carcinoma     Removal squamous cell carcinoma       Current Outpatient Medications  Medication Sig Dispense Refill   apixaban (ELIQUIS) 5 MG TABS tablet Take 1 tablet (5  mg total) by mouth 2 (two) times daily. 180 tablet 1   diltiazem (CARDIZEM CD) 240 MG 24 hr capsule TAKE 1 CAPSULE BY MOUTH ONCE A DAY 90 capsule 3   dronedarone (MULTAQ) 400 MG tablet Take 1 tablet (400 mg total) by mouth 2 (two) times daily with a meal. 60 tablet 3   irbesartan (AVAPRO) 300 MG tablet Take 0.5 tablets (150 mg total) by mouth daily. 90 tablet 3   metoprolol succinate (TOPROL-XL) 100 MG 24 hr tablet Take 1/2 tablet by mouth in the morning and 1 tablet by mouth at bedtime 135 tablet 3   Multiple Vitamin (MULTIVITAMIN ADULT PO) Take by mouth.     Omega-3 Fatty Acids (FISH OIL PO) Take 1 tablet by mouth daily.      OneTouch Delica Lancets 73S MISC USE TO CHECK BLOOD SUGAR ONCE DAILY 100 each 1   rosuvastatin (CRESTOR) 20 MG tablet Take 1 tablet (20 mg total) by mouth daily. 90 tablet 3   sildenafil (VIAGRA) 100 MG tablet TAKE 1 TABLET BY MOUTH DAILY AS NEEDED FOR ERECTILE DYSFUNCTION. (Patient taking differently: as needed.) 12 tablet 1   sulfamethoxazole-trimethoprim (BACTRIM DS) 800-160 MG tablet Take 1 tablet by mouth 2 (two) times daily. 20 tablet 0   tamsulosin (FLOMAX) 0.4 MG CAPS capsule TAKE 1 CAPSULE BY MOUTH DAILY 90 capsule 3   torsemide (DEMADEX) 20 MG tablet Take 2 tablets (40 mg total) by mouth daily. 120 tablet 0  No current facility-administered medications for this visit.    Allergies:   Oxycodone   Social History:  The patient  reports that he has never smoked. He has never used smokeless tobacco. He reports current alcohol use of about 3.0 standard drinks per week. He reports that he does not use drugs.   Family History:  The patient's family history includes CAD in his maternal grandfather; Heart failure in his mother and paternal grandfather; Hypertension in his father and mother; Peripheral vascular disease (age of onset: 69) in his mother.   ROS:  Please see the history of present illness.   Otherwise, review of systems is positive for none.   All other  systems are reviewed and negative.   PHYSICAL EXAM: VS:  BP 130/82   Pulse 64   Ht 5' 10"  (1.778 m)   Wt 243 lb (110.2 kg)   SpO2 96%   BMI 34.87 kg/m  , BMI Body mass index is 34.87 kg/m. GEN: Well nourished, well developed, in no acute distress  HEENT: normal  Neck: no JVD, carotid bruits, or masses Cardiac: RRR; no murmurs, rubs, or gallops,no edema  Respiratory:  clear to auscultation bilaterally, normal work of breathing GI: soft, nontender, nondistended, + BS MS: no deformity or atrophy  Skin: warm and dry Neuro:  Strength and sensation are intact Psych: euthymic mood, full affect  EKG:  EKG is not ordered today. Personal review of the ekg ordered 10*20*22 shows sinus rhythm, rate 55  Recent Labs: 12/05/2020: Brain Natriuretic Peptide 232 12/11/2020: Magnesium 1.7 01/16/2021: ALT 34 02/16/2021: BUN 20; Creatinine, Ser 1.92; Hemoglobin 14.6; Platelets 238; Potassium 3.7; Sodium 131    Lipid Panel     Component Value Date/Time   CHOL 158 12/11/2020 1011   TRIG 83 12/11/2020 1011   HDL 81 12/11/2020 1011   CHOLHDL 2.0 12/11/2020 1011   CHOLHDL 3.4 05/16/2020 0920   VLDL 28 05/26/2019 0434   LDLCALC 62 12/11/2020 1011   LDLCALC 105 (H) 05/16/2020 0920     Wt Readings from Last 3 Encounters:  03/02/21 243 lb (110.2 kg)  02/26/21 244 lb 7.8 oz (110.9 kg)  02/16/21 244 lb 6.4 oz (110.9 kg)      Other studies Reviewed: Additional studies/ records that were reviewed today include: TTE 02/05/21  Review of the above records today demonstrates:   1. Left ventricular ejection fraction, by estimation, is 40 to 45%. The  left ventricle has mildly decreased function. The left ventricle  demonstrates global hypokinesis. Left ventricular diastolic parameters are  consistent with Grade I diastolic  dysfunction (impaired relaxation).   2. Right ventricular systolic function is normal. The right ventricular  size is normal.   3. The mitral valve is normal in structure. Mild  mitral valve  regurgitation. No evidence of mitral stenosis.   4. The aortic valve is normal in structure. Aortic valve regurgitation is  not visualized. No aortic stenosis is present.   5. The inferior vena cava is normal in size with greater than 50%  respiratory variability, suggesting right atrial pressure of 3 mmHg.  Cardiac monitor 02/06/2021 personally reviewed 100% atrial fibrillation burden rage 57-208 bpm Avg 128 bpm Rare ventricular and supraventricular ectopy  ASSESSMENT AND PLAN:  1.  Persistent atrial fibrillation: Currently on Toprol-XL 50 mg daily, Multaq 400 mg twice daily, Eliquis 5 mg twice daily.  High risk medication monitoring via ECG for Multaq.  CHA2DS2-VASc of 4.  He has unfortunately continued to have episodes of atrial fibrillation.  He feels short of breath.  He would like to avoid further medications and would prefer ablation.  We discussed the procedure.  He understands the risks and is agreed to ablation.  Risk, benefits, and alternatives to EP study and radiofrequency ablation for afib were also discussed in detail today. These risks include but are not limited to stroke, bleeding, vascular damage, tamponade, perforation, damage to the esophagus, lungs, and other structures, pulmonary vein stenosis, worsening renal function, and death. The patient understands these risk and wishes to proceed.  We Drucella Karbowski therefore proceed with catheter ablation at the next available time.  Carto, ICE, anesthesia are requested for the procedure.  Alisia Vanengen also obtain CT PV protocol prior to the procedure to exclude LAA thrombus and further evaluate atrial anatomy.   2.  Coronary artery disease: Coronary CT.  Plan for medical management.  3.  Hypertension: Currently well controlled  4.  Hyperlipidemia: Goal LDL less than 70.  Continue Crestor 20 mg per primary cardiology.  5.  Alcohol abuse: Complete cessation encouraged  6.  Obesity:Body mass index is 34.87 kg/m. Diet and exercise  encouraged  7.  Snoring: OSA suspected.  Gailen Venne need sleep study.  Case discussed with primary cardiology  Current medicines are reviewed at length with the patient today.   The patient does not have concerns regarding his medicines.  The following changes were made today: None  Labs/ tests ordered today include:  No orders of the defined types were placed in this encounter.    Disposition:   FU with Kawhi Diebold 3 months  Signed, Esa Raden Meredith Leeds, MD  03/02/2021 9:07 AM     [facility] [address] [place] [address] [phone] (office) [phone] (fax)

## 2021-03-02 NOTE — Telephone Encounter (Signed)
Forms from [employer] have been received on 03/02/2021. Completed patient auth attached. Took form to Dr. Curt Bears box for completion. 03/02/2021 JMM

## 2021-03-12 LAB — HM DIABETES EYE EXAM

## 2021-03-24 ENCOUNTER — Telehealth: Payer: Self-pay | Admitting: Cardiology

## 2021-03-24 NOTE — Telephone Encounter (Signed)
Form completed by Dr. Curt Bears and faxed to [facility] on 03/24/2021. Patient notified and ready for pick up. 03/24/2021 JMM

## 2021-03-30 ENCOUNTER — Other Ambulatory Visit: Payer: Self-pay | Admitting: Internal Medicine

## 2021-03-30 ENCOUNTER — Other Ambulatory Visit (HOSPITAL_COMMUNITY): Payer: Self-pay

## 2021-03-30 MED ORDER — TORSEMIDE 20 MG PO TABS
ORAL_TABLET | ORAL | 2 refills | Status: DC
  Filled 2021-03-30: qty 120, 30d supply, fill #0
  Filled 2021-11-18: qty 120, 30d supply, fill #1

## 2021-03-31 ENCOUNTER — Other Ambulatory Visit (HOSPITAL_COMMUNITY): Payer: Self-pay

## 2021-04-13 ENCOUNTER — Other Ambulatory Visit: Payer: Self-pay | Admitting: *Deleted

## 2021-04-13 ENCOUNTER — Encounter: Payer: Self-pay | Admitting: Cardiology

## 2021-04-13 DIAGNOSIS — I480 Paroxysmal atrial fibrillation: Secondary | ICD-10-CM

## 2021-04-15 ENCOUNTER — Other Ambulatory Visit: Payer: Self-pay

## 2021-04-15 ENCOUNTER — Telehealth (HOSPITAL_COMMUNITY): Payer: Self-pay | Admitting: *Deleted

## 2021-04-15 ENCOUNTER — Other Ambulatory Visit: Payer: PRIVATE HEALTH INSURANCE

## 2021-04-15 ENCOUNTER — Other Ambulatory Visit (HOSPITAL_COMMUNITY): Payer: Self-pay | Admitting: *Deleted

## 2021-04-15 ENCOUNTER — Telehealth: Payer: Self-pay | Admitting: Cardiology

## 2021-04-15 DIAGNOSIS — I480 Paroxysmal atrial fibrillation: Secondary | ICD-10-CM

## 2021-04-15 DIAGNOSIS — I4819 Other persistent atrial fibrillation: Secondary | ICD-10-CM

## 2021-04-15 DIAGNOSIS — Z01812 Encounter for preprocedural laboratory examination: Secondary | ICD-10-CM

## 2021-04-15 DIAGNOSIS — E1159 Type 2 diabetes mellitus with other circulatory complications: Secondary | ICD-10-CM

## 2021-04-15 NOTE — Telephone Encounter (Signed)
Patient states he is returning a call from [place]. He states he needs to have a CT done, but needs lab work prior. He was scheduled to have the lab work done today, but states he cannot make that. He wants to know if can have the lab done tomorrow instead of today. He also says the CT nurse put in the lab orders and she was not sure if they would be accepted by the lab to draw. He wants to make sure they will be before he tries to come.

## 2021-04-15 NOTE — Telephone Encounter (Signed)
Reaching out to patient to offer assistance regarding upcoming cardiac imaging study; pt verbalizes understanding of appt date/time, parking situation and where to check in, pre-test NPO status, and verified current allergies; name and call back number provided for further questions should they arise  Kyle Clement RN [facility] [phone] office [phone] cell  Patient aware to obtain blood work prior to cardiac CT scan and is aware to arrive at 7:30am for his 8am scan.

## 2021-04-15 NOTE — Telephone Encounter (Signed)
Returned call.  Pt will stop by the office tomorrow for blood work.

## 2021-04-16 ENCOUNTER — Other Ambulatory Visit: Payer: PRIVATE HEALTH INSURANCE

## 2021-04-16 DIAGNOSIS — I4819 Other persistent atrial fibrillation: Secondary | ICD-10-CM

## 2021-04-16 DIAGNOSIS — Z01812 Encounter for preprocedural laboratory examination: Secondary | ICD-10-CM

## 2021-04-16 LAB — BASIC METABOLIC PANEL
BUN/Creatinine Ratio: 10 (ref 9–20)
BUN: 13 mg/dL (ref 6–24)
CO2: 34 mmol/L — ABNORMAL HIGH (ref 20–29)
Calcium: 9.8 mg/dL (ref 8.7–10.2)
Chloride: 91 mmol/L — ABNORMAL LOW (ref 96–106)
Creatinine, Ser: 1.25 mg/dL (ref 0.76–1.27)
Glucose: 303 mg/dL — ABNORMAL HIGH (ref 70–99)
Potassium: 4.8 mmol/L (ref 3.5–5.2)
Sodium: 134 mmol/L (ref 134–144)
eGFR: 67 mL/min/{1.73_m2} (ref >59)

## 2021-04-16 LAB — LIPID PANEL
Chol/HDL Ratio: 2.8 ratio (ref 0.0–5.0)
Cholesterol, Total: 190 mg/dL (ref 100–199)
HDL: 69 mg/dL (ref >39)
LDL Chol Calc (NIH): 103 mg/dL — ABNORMAL HIGH (ref 0–99)
Triglycerides: 100 mg/dL (ref 0–149)
VLDL Cholesterol Cal: 18 mg/dL (ref 5–40)

## 2021-04-16 LAB — CBC
Hematocrit: 44.7 % (ref 37.5–51.0)
Hemoglobin: 15.6 g/dL (ref 13.0–17.7)
MCH: 33.2 pg — ABNORMAL HIGH (ref 26.6–33.0)
MCHC: 34.9 g/dL (ref 31.5–35.7)
MCV: 95 fL (ref 79–97)
Platelets: 228 10*3/uL (ref 150–450)
RBC: 4.7 x10E6/uL (ref 4.14–5.80)
RDW: 13 % (ref 11.6–15.4)
WBC: 4.9 10*3/uL (ref 3.4–10.8)

## 2021-04-17 ENCOUNTER — Other Ambulatory Visit: Payer: Self-pay

## 2021-04-17 ENCOUNTER — Telehealth: Payer: Self-pay

## 2021-04-17 ENCOUNTER — Encounter (HOSPITAL_COMMUNITY): Payer: Self-pay

## 2021-04-17 ENCOUNTER — Ambulatory Visit (HOSPITAL_COMMUNITY)
Admission: RE | Admit: 2021-04-17 | Discharge: 2021-04-17 | Disposition: A | Discharge status: Home / Self Care | Payer: 59 | Source: Ambulatory Visit | Attending: Cardiology | Admitting: Cardiology

## 2021-04-17 DIAGNOSIS — I480 Paroxysmal atrial fibrillation: Secondary | ICD-10-CM

## 2021-04-17 MED ORDER — IOHEXOL 350 MG/ML SOLN
80.0000 mL | Freq: Once | INTRAVENOUS | Status: AC | PRN
  Administered 2021-04-17: 80 mL via INTRAVENOUS

## 2021-04-17 NOTE — Telephone Encounter (Signed)
I spoke with the pt re: his Lipid results and he reports that he has not been consistent taking his Crestor 20 mg daily... he has only been taking maybe 4 days a week... he says he will start taking daily and he is starting to work on his diet.   He is having an Ablation 04/22/21 and he prefers to hold off on anything new until after.   He says for now he will take it regularly and let us know if he would like to pursue talking with  the pharmacist about helping him get back on track.

## 2021-04-17 NOTE — Telephone Encounter (Signed)
-----   Message from Fay Records, MD sent at 04/16/2021 11:44 PM EST ----- LDL is 103  Has been better   ? What is she taking  COnfirm if on crestor 20 mg  Needs to be better  LDL less than 70  If taking Crestor need to consider Repatha

## 2021-04-20 ENCOUNTER — Other Ambulatory Visit: Payer: PRIVATE HEALTH INSURANCE

## 2021-04-21 NOTE — Pre-Procedure Instructions (Signed)
Instructed patient on the following items: Arrival time 0930 Nothing to eat or drink after midnight No meds AM of procedure Responsible person to drive you home and stay with you for 24 hrs  Have you missed any doses of anti-coagulant Eliquis- hasn't missed any doses

## 2021-04-22 ENCOUNTER — Ambulatory Visit (HOSPITAL_COMMUNITY)
Admission: RE | Admit: 2021-04-22 | Discharge: 2021-04-22 | Disposition: A | Discharge status: Home / Self Care | Payer: 59 | Attending: Cardiology | Admitting: Cardiology

## 2021-04-22 ENCOUNTER — Other Ambulatory Visit (HOSPITAL_COMMUNITY): Payer: Self-pay

## 2021-04-22 ENCOUNTER — Encounter (HOSPITAL_COMMUNITY)
Admission: RE | Disposition: A | Discharge status: Home / Self Care | Payer: 59 | Source: Home / Self Care | Attending: Cardiology

## 2021-04-22 ENCOUNTER — Other Ambulatory Visit: Payer: Self-pay

## 2021-04-22 ENCOUNTER — Ambulatory Visit (HOSPITAL_COMMUNITY): Payer: 59 | Admitting: Anesthesiology

## 2021-04-22 ENCOUNTER — Encounter (HOSPITAL_COMMUNITY): Payer: Self-pay | Admitting: Cardiology

## 2021-04-22 DIAGNOSIS — Z7901 Long term (current) use of anticoagulants: Secondary | ICD-10-CM | POA: Diagnosis not present

## 2021-04-22 DIAGNOSIS — E119 Type 2 diabetes mellitus without complications: Secondary | ICD-10-CM | POA: Insufficient documentation

## 2021-04-22 DIAGNOSIS — I2510 Atherosclerotic heart disease of native coronary artery without angina pectoris: Secondary | ICD-10-CM | POA: Diagnosis not present

## 2021-04-22 DIAGNOSIS — I10 Essential (primary) hypertension: Secondary | ICD-10-CM | POA: Diagnosis not present

## 2021-04-22 DIAGNOSIS — E785 Hyperlipidemia, unspecified: Secondary | ICD-10-CM | POA: Diagnosis not present

## 2021-04-22 DIAGNOSIS — I4819 Other persistent atrial fibrillation: Secondary | ICD-10-CM | POA: Insufficient documentation

## 2021-04-22 HISTORY — PX: ATRIAL FIBRILLATION ABLATION: EP1191

## 2021-04-22 LAB — GLUCOSE, CAPILLARY
Glucose-Capillary: 254 mg/dL — ABNORMAL HIGH (ref 70–99)
Glucose-Capillary: 233 mg/dL — ABNORMAL HIGH (ref 70–99)

## 2021-04-22 LAB — POCT ACTIVATED CLOTTING TIME
Activated Clotting Time: 371 seconds
Activated Clotting Time: 335 seconds
Activated Clotting Time: 353 seconds
Activated Clotting Time: 335 seconds
Activated Clotting Time: 347 seconds

## 2021-04-22 SURGERY — ATRIAL FIBRILLATION ABLATION
Anesthesia: General

## 2021-04-22 MED ORDER — HEPARIN (PORCINE) IN NACL 2000-0.9 UNIT/L-% IV SOLN
INTRAVENOUS | Status: AC
  Filled 2021-04-22: qty 1000

## 2021-04-22 MED ORDER — SODIUM CHLORIDE 0.9 % IV SOLN: 250.0000 mL | INTRAVENOUS | Status: DC | PRN

## 2021-04-22 MED ORDER — HEPARIN (PORCINE) IN NACL 2000-0.9 UNIT/L-% IV SOLN
INTRAVENOUS | Status: DC | PRN
  Administered 2021-04-22 (×3): 1000 mL

## 2021-04-22 MED ORDER — DOBUTAMINE INFUSION FOR EP/ECHO/NUC (1000 MCG/ML)
INTRAVENOUS | Status: DC | PRN
  Administered 2021-04-22: 20 ug/kg/min via INTRAVENOUS

## 2021-04-22 MED ORDER — ACETAMINOPHEN 500 MG PO TABS
1000.0000 mg | ORAL_TABLET | Freq: Once | ORAL | Status: AC
  Administered 2021-04-22: 10:00:00 1000 mg via ORAL
  Filled 2021-04-22: qty 2

## 2021-04-22 MED ORDER — PHENYLEPHRINE HCL-NACL 20-0.9 MG/250ML-% IV SOLN
INTRAVENOUS | Status: DC | PRN
  Administered 2021-04-22: 50 ug/min via INTRAVENOUS

## 2021-04-22 MED ORDER — ROCURONIUM BROMIDE 10 MG/ML (PF) SYRINGE
PREFILLED_SYRINGE | INTRAVENOUS | Status: DC | PRN
  Administered 2021-04-22: 30 mg via INTRAVENOUS
  Administered 2021-04-22: 60 mg via INTRAVENOUS

## 2021-04-22 MED ORDER — HEPARIN SODIUM (PORCINE) 1000 UNIT/ML IJ SOLN
INTRAMUSCULAR | Status: DC | PRN
  Administered 2021-04-22: 1000 [IU] via INTRAVENOUS

## 2021-04-22 MED ORDER — PROTAMINE SULFATE 10 MG/ML IV SOLN
INTRAVENOUS | Status: DC | PRN
  Administered 2021-04-22: 40 mg via INTRAVENOUS

## 2021-04-22 MED ORDER — COLCHICINE 0.6 MG PO TABS
0.6000 mg | ORAL_TABLET | Freq: Two times a day (BID) | ORAL | 0 refills | Status: DC
  Filled 2021-04-22: qty 14, 7d supply, fill #0

## 2021-04-22 MED ORDER — ACETAMINOPHEN 325 MG PO TABS
650.0000 mg | ORAL_TABLET | ORAL | Status: DC | PRN
  Filled 2021-04-22: qty 2

## 2021-04-22 MED ORDER — DEXAMETHASONE SODIUM PHOSPHATE 10 MG/ML IJ SOLN
INTRAMUSCULAR | Status: DC | PRN
  Administered 2021-04-22: 10 mg via INTRAVENOUS

## 2021-04-22 MED ORDER — EPHEDRINE SULFATE-NACL 50-0.9 MG/10ML-% IV SOSY
PREFILLED_SYRINGE | INTRAVENOUS | Status: DC | PRN
  Administered 2021-04-22: 10 mg via INTRAVENOUS

## 2021-04-22 MED ORDER — INSULIN ASPART 100 UNIT/ML IJ SOLN
INTRAMUSCULAR | Status: AC
  Filled 2021-04-22: qty 1

## 2021-04-22 MED ORDER — HEPARIN SODIUM (PORCINE) 1000 UNIT/ML IJ SOLN
INTRAMUSCULAR | Status: AC
  Filled 2021-04-22: qty 10

## 2021-04-22 MED ORDER — FENTANYL CITRATE (PF) 100 MCG/2ML IJ SOLN
INTRAMUSCULAR | Status: DC | PRN
  Administered 2021-04-22 (×2): 50 ug via INTRAVENOUS

## 2021-04-22 MED ORDER — ONDANSETRON HCL 4 MG/2ML IJ SOLN: 4.0000 mg | Freq: Four times a day (QID) | INTRAMUSCULAR | Status: DC | PRN

## 2021-04-22 MED ORDER — SUGAMMADEX SODIUM 200 MG/2ML IV SOLN
INTRAVENOUS | Status: DC | PRN
Start: 2021-04-22 — End: 2021-04-22
  Administered 2021-04-22: 200 mg via INTRAVENOUS

## 2021-04-22 MED ORDER — MIDAZOLAM HCL 2 MG/2ML IJ SOLN
INTRAMUSCULAR | Status: DC | PRN
  Administered 2021-04-22: 2 mg via INTRAVENOUS

## 2021-04-22 MED ORDER — SODIUM CHLORIDE 0.9% FLUSH: 3.0000 mL | INTRAVENOUS | Status: DC | PRN

## 2021-04-22 MED ORDER — ONDANSETRON HCL 4 MG/2ML IJ SOLN
INTRAMUSCULAR | Status: DC | PRN
  Administered 2021-04-22: 4 mg via INTRAVENOUS

## 2021-04-22 MED ORDER — LIDOCAINE 2% (20 MG/ML) 5 ML SYRINGE
INTRAMUSCULAR | Status: DC | PRN
  Administered 2021-04-22: 100 mg via INTRAVENOUS

## 2021-04-22 MED ORDER — INSULIN ASPART 100 UNIT/ML IJ SOLN
5.0000 [IU] | Freq: Once | INTRAMUSCULAR | Status: AC
  Administered 2021-04-22: 10:00:00 5 [IU] via SUBCUTANEOUS
  Filled 2021-04-22: qty 0.05

## 2021-04-22 MED ORDER — SODIUM CHLORIDE 0.9 % IV SOLN: INTRAVENOUS | Status: DC

## 2021-04-22 MED ORDER — PROPOFOL 10 MG/ML IV BOLUS
INTRAVENOUS | Status: DC | PRN
  Administered 2021-04-22: 140 mg via INTRAVENOUS

## 2021-04-22 MED ORDER — HEPARIN SODIUM (PORCINE) 1000 UNIT/ML IJ SOLN
INTRAMUSCULAR | Status: DC | PRN
  Administered 2021-04-22: 2000 [IU] via INTRAVENOUS
  Administered 2021-04-22 (×2): 1000 [IU] via INTRAVENOUS
  Administered 2021-04-22: 15000 [IU] via INTRAVENOUS

## 2021-04-22 MED ORDER — SODIUM CHLORIDE 0.9% FLUSH: 3.0000 mL | Freq: Two times a day (BID) | INTRAVENOUS | Status: DC

## 2021-04-22 MED ORDER — COLCHICINE 0.6 MG PO TABS
0.6000 mg | ORAL_TABLET | ORAL | Status: AC
  Administered 2021-04-22: 16:00:00 0.6 mg via ORAL
  Filled 2021-04-22: qty 1

## 2021-04-22 MED ORDER — DOBUTAMINE INFUSION FOR EP/ECHO/NUC (1000 MCG/ML)
INTRAVENOUS | Status: AC
  Filled 2021-04-22: qty 250

## 2021-04-22 MED ORDER — PHENYLEPHRINE 40 MCG/ML (10ML) SYRINGE FOR IV PUSH (FOR BLOOD PRESSURE SUPPORT)
PREFILLED_SYRINGE | INTRAVENOUS | Status: DC | PRN
  Administered 2021-04-22: 120 ug via INTRAVENOUS
  Administered 2021-04-22: 160 ug via INTRAVENOUS

## 2021-04-22 SURGICAL SUPPLY — 20 items
BAG SNAP BAND KOVER 36X36 (MISCELLANEOUS) ×2 IMPLANT
CATH OCTARAY 2.0 F 3-3-3-3-3 (CATHETERS) ×2 IMPLANT
CATH S CIRCA THERM PROBE 10F (CATHETERS) ×2 IMPLANT
CATH SMTCH THERMOCOOL SF DF (CATHETERS) ×2 IMPLANT
CATH SOUNDSTAR ECO 8FR (CATHETERS) ×2 IMPLANT
CATH WEB BI DIR CSDF CRV REPRO (CATHETERS) ×2 IMPLANT
CLOSURE PERCLOSE PROSTYLE (VASCULAR PRODUCTS) ×8 IMPLANT
COVER SWIFTLINK CONNECTOR (BAG) ×3 IMPLANT
KIT VERSACROSS STEERABLE D1 (CATHETERS) ×2 IMPLANT
MAT PREVALON FULL STRYKER (MISCELLANEOUS) ×2 IMPLANT
PACK EP LATEX FREE (CUSTOM PROCEDURE TRAY) ×3
PACK EP LF (CUSTOM PROCEDURE TRAY) ×1 IMPLANT
PAD DEFIB RADIO PHYSIO CONN (PAD) ×3 IMPLANT
PATCH CARTO3 (PAD) ×2 IMPLANT
SHEATH CARTO VIZIGO SM CVD (SHEATH) ×2 IMPLANT
SHEATH PINNACLE 7F 10CM (SHEATH) ×2 IMPLANT
SHEATH PINNACLE 8F 10CM (SHEATH) ×4 IMPLANT
SHEATH PINNACLE 9F 10CM (SHEATH) ×2 IMPLANT
SHEATH PROBE COVER 6X72 (BAG) ×2 IMPLANT
TUBING SMART ABLATE COOLFLOW (TUBING) ×2 IMPLANT

## 2021-04-22 NOTE — Anesthesia Postprocedure Evaluation (Signed)
Anesthesia Post Note  Patient: Kyle Dawson  Procedure(s) Performed: ATRIAL FIBRILLATION ABLATION     Patient location during evaluation: PACU Anesthesia Type: General Level of consciousness: awake and alert Pain management: pain level controlled Vital Signs Assessment: post-procedure vital signs reviewed and stable Respiratory status: spontaneous breathing, nonlabored ventilation, respiratory function stable and patient connected to nasal cannula oxygen Cardiovascular status: blood pressure returned to baseline and stable Postop Assessment: no apparent nausea or vomiting Anesthetic complications: no   There were no known notable events for this encounter.  Last Vitals:  Vitals:   04/22/21 1537 04/22/21 1540  BP: (!) 142/85 (!) 142/85  Pulse: 76 76  Resp: 20 (!) 22  Temp:    SpO2: 94% 94%    Last Pain:  Vitals:   04/22/21 1458  TempSrc:   PainSc: 0-No pain                 Catalina Gravel

## 2021-04-22 NOTE — H&P (Signed)
Electrophysiology Office Note   Date:  04/22/2021   ID:  Kyle Dawson, DOB Feb 19, 1966, MRN [id number]  PCP:  Elby Showers, MD  Cardiologist:  Harrington Challenger Primary Electrophysiologist:  Sakinah Rosamond Meredith Leeds, MD    Chief Complaint: AF   History of Present Illness: Kyle Dawson is a 55 y.o. male who is being seen today for the evaluation of AF at the request of No ref. provider found. Presenting today for electrophysiology evaluation.  He has a history of coronary artery disease, diabetes, hypertension, hyperlipidemia, alcohol abuse, and atrial fibrillation.  He was hospitalized in May 2021 with rapid atrial fibrillation.  He had been drinking prior to that.  He is currently on Eliquis.  He wore a recent Zio patch that showed a 100% atrial fibrillation burden.  He was also found to have an ejection fraction of 40-45%.  Today, denies symptoms of palpitations, chest pain, shortness of breath, orthopnea, PND, lower extremity edema, claudication, dizziness, presyncope, syncope, bleeding, or neurologic sequela. The patient is tolerating medications without difficulties. Plan ablation today.    Past Medical History:  Diagnosis Date   Basal cell carcinoma    Chest pain    Diabetes mellitus without complication (HCC)    Edema    lower extremity   Hypertension    Squamous cell carcinoma    R ear, nose, each side of face   Past Surgical History:  Procedure Laterality Date   Removal basal cell carcinoma     Removal squamous cell carcinoma       Current Facility-Administered Medications  Medication Dose Route Frequency Provider Last Rate Last Admin   0.9 %  sodium chloride infusion   Intravenous Continuous Yoshika Vensel Hassell Done, MD 50 mL/hr at 04/22/21 1036 Continued from Pre-op at 04/22/21 1036   insulin aspart (novoLOG) 100 UNIT/ML injection            insulin aspart (novoLOG) 100 UNIT/ML injection             Allergies:   Oxycodone   Social History:  The patient  reports that  he has never smoked. He has never used smokeless tobacco. He reports current alcohol use of about 3.0 standard drinks per week. He reports that he does not use drugs.   Family History:  The patient's family history includes CAD in his maternal grandfather; Heart failure in his mother and paternal grandfather; Hypertension in his father and mother; Peripheral vascular disease (age of onset: 63) in his mother.   ROS:  Please see the history of present illness.   Otherwise, review of systems is positive for none.   All other systems are reviewed and negative.   PHYSICAL EXAM: VS:  BP 137/90    Pulse 63    Temp 98.3 F (36.8 C) (Oral)    Ht 5' 10"  (1.778 m)    Wt 97.5 kg    SpO2 99%    BMI 30.85 kg/m  , BMI Body mass index is 30.85 kg/m. GEN: Well nourished, well developed, in no acute distress  HEENT: normal  Neck: no JVD, carotid bruits, or masses Cardiac: RRR; no murmurs, rubs, or gallops,no edema  Respiratory:  clear to auscultation bilaterally, normal work of breathing GI: soft, nontender, nondistended, + BS MS: no deformity or atrophy  Skin: warm and dry Neuro:  Strength and sensation are intact Psych: euthymic mood, full affect  Recent Labs: 12/05/2020: Brain Natriuretic Peptide 232 12/11/2020: Magnesium 1.7 01/16/2021: ALT 34 04/16/2021: BUN 13; Creatinine, Ser  1.25; Hemoglobin 15.6; Platelets 228; Potassium 4.8; Sodium 134    Lipid Panel     Component Value Date/Time   CHOL 190 04/16/2021 0755   TRIG 100 04/16/2021 0755   HDL 69 04/16/2021 0755   CHOLHDL 2.8 04/16/2021 0755   CHOLHDL 3.4 05/16/2020 0920   VLDL 28 05/26/2019 0434   LDLCALC 103 (H) 04/16/2021 0755   LDLCALC 105 (H) 05/16/2020 0920     Wt Readings from Last 3 Encounters:  04/22/21 97.5 kg  03/02/21 110.2 kg  02/26/21 110.9 kg      Other studies Reviewed: Additional studies/ records that were reviewed today include: TTE 02/05/21  Review of the above records today demonstrates:   1. Left ventricular  ejection fraction, by estimation, is 40 to 45%. The  left ventricle has mildly decreased function. The left ventricle  demonstrates global hypokinesis. Left ventricular diastolic parameters are  consistent with Grade I diastolic  dysfunction (impaired relaxation).   2. Right ventricular systolic function is normal. The right ventricular  size is normal.   3. The mitral valve is normal in structure. Mild mitral valve  regurgitation. No evidence of mitral stenosis.   4. The aortic valve is normal in structure. Aortic valve regurgitation is  not visualized. No aortic stenosis is present.   5. The inferior vena cava is normal in size with greater than 50%  respiratory variability, suggesting right atrial pressure of 3 mmHg.  Cardiac monitor 02/06/2021 personally reviewed 100% atrial fibrillation burden rage 57-208 bpm Avg 128 bpm Rare ventricular and supraventricular ectopy  ASSESSMENT AND PLAN:  1.  Persistent atrial fibrillation: Kyle Dawson has presented today for surgery, with the diagnosis of atrial fibrillation.  The various methods of treatment have been discussed with the patient and family. After consideration of risks, benefits and other options for treatment, the patient has consented to  Procedure(s): Catheter ablation as a surgical intervention .  Risks include but not limited to complete heart block, stroke, esophageal damage, nerve damage, bleeding, vascular damage, tamponade, perforation, MI, and death. The patient's history has been reviewed, patient examined, no change in status, stable for surgery.  I have reviewed the patient's chart and labs.  Questions were answered to the patient's satisfaction.    Kyle Fread Curt Bears, MD 04/22/2021 10:58 AM

## 2021-04-22 NOTE — Anesthesia Preprocedure Evaluation (Signed)
Anesthesia Evaluation  Patient identified by MRN, date of birth, ID band Patient awake    Reviewed: Allergy & Precautions, NPO status , Patient's Chart, lab work & pertinent test results, reviewed documented beta blocker date and time   Airway Mallampati: II  TM Distance: >3 FB Neck ROM: Full    Dental  (+) Teeth Intact, Dental Advisory Given   Pulmonary neg pulmonary ROS,    Pulmonary exam normal breath sounds clear to auscultation       Cardiovascular hypertension, Pt. on medications and Pt. on home beta blockers + dysrhythmias Atrial Fibrillation  Rhythm:Irregular Rate:Abnormal     Neuro/Psych PSYCHIATRIC DISORDERS Anxiety negative neurological ROS     GI/Hepatic negative GI ROS, (+)     substance abuse  alcohol use,   Endo/Other  diabetes, Type 2Obesity   Renal/GU negative Renal ROS     Musculoskeletal negative musculoskeletal ROS (+)   Abdominal   Peds  Hematology  (+) Blood dyscrasia (Eliquis), ,   Anesthesia Other Findings Day of surgery medications reviewed with the patient.  Reproductive/Obstetrics                             Anesthesia Physical Anesthesia Plan  ASA: 3  Anesthesia Plan: General   Post-op Pain Management: Tylenol PO (pre-op)   Induction: Intravenous  PONV Risk Score and Plan: 2 and Midazolam, Dexamethasone and Ondansetron  Airway Management Planned: Oral ETT  Additional Equipment:   Intra-op Plan:   Post-operative Plan: Extubation in OR  Informed Consent: I have reviewed the patients History and Physical, chart, labs and discussed the procedure including the risks, benefits and alternatives for the proposed anesthesia with the patient or authorized representative who has indicated his/her understanding and acceptance.     Dental advisory given  Plan Discussed with: CRNA  Anesthesia Plan Comments:         Anesthesia Quick Evaluation

## 2021-04-22 NOTE — Transfer of Care (Signed)
Immediate Anesthesia Transfer of Care Note  Patient: Kyle Dawson  Procedure(s) Performed: ATRIAL FIBRILLATION ABLATION  Patient Location: Cath Lab  Anesthesia Type:General  Level of Consciousness: awake, alert  and oriented  Airway & Oxygen Therapy: Patient Spontanous Breathing and Patient connected to nasal cannula oxygen  Post-op Assessment: Report given to RN and Post -op Vital signs reviewed and stable  Post vital signs: Reviewed and stable  Last Vitals:  Vitals Value Taken Time  BP 139/71 04/22/21 1449  Temp    Pulse 83 04/22/21 1453  Resp 20 04/22/21 1453  SpO2 93 % 04/22/21 1453  Vitals shown include unvalidated device data.  Last Pain:  Vitals:   04/22/21 1007  TempSrc:   PainSc: 0-No pain      Patients Stated Pain Goal: 3 (45/03/88 8280)  Complications: There were no known notable events for this encounter.

## 2021-04-22 NOTE — Anesthesia Procedure Notes (Signed)
Procedure Name: Intubation Date/Time: 04/22/2021 11:47 AM Performed by: Dorthea Cove, CRNA Pre-anesthesia Checklist: Patient identified, Emergency Drugs available, Suction available and Patient being monitored Patient Re-evaluated:Patient Re-evaluated prior to induction Oxygen Delivery Method: Circle system utilized Preoxygenation: Pre-oxygenation with 100% oxygen Induction Type: IV induction Ventilation: Mask ventilation without difficulty and Two handed mask ventilation required Laryngoscope Size: Mac and 4 Grade View: Grade II Tube type: Oral Tube size: 7.5 mm Number of attempts: 1 Airway Equipment and Method: Stylet and Oral airway Placement Confirmation: ETT inserted through vocal cords under direct vision, positive ETCO2 and breath sounds checked- equal and bilateral Secured at: 23 cm Tube secured with: Tape Dental Injury: Teeth and Oropharynx as per pre-operative assessment

## 2021-04-23 ENCOUNTER — Other Ambulatory Visit (HOSPITAL_COMMUNITY): Payer: Self-pay

## 2021-04-23 ENCOUNTER — Encounter: Payer: Self-pay | Admitting: Cardiology

## 2021-04-23 ENCOUNTER — Other Ambulatory Visit: Payer: PRIVATE HEALTH INSURANCE

## 2021-04-23 ENCOUNTER — Encounter (HOSPITAL_COMMUNITY): Payer: Self-pay | Admitting: Cardiology

## 2021-04-23 ENCOUNTER — Encounter: Payer: Self-pay | Admitting: Endocrinology

## 2021-04-23 DIAGNOSIS — E1165 Type 2 diabetes mellitus with hyperglycemia: Secondary | ICD-10-CM

## 2021-04-24 ENCOUNTER — Other Ambulatory Visit (HOSPITAL_COMMUNITY): Payer: Self-pay

## 2021-04-24 MED ORDER — GLIPIZIDE 5 MG PO TABS
5.0000 mg | ORAL_TABLET | Freq: Two times a day (BID) | ORAL | 0 refills | Status: DC
  Filled 2021-04-24: qty 60, 30d supply, fill #0

## 2021-04-28 ENCOUNTER — Ambulatory Visit: Payer: PRIVATE HEALTH INSURANCE | Admitting: Endocrinology

## 2021-04-30 ENCOUNTER — Other Ambulatory Visit: Payer: Self-pay | Admitting: Internal Medicine

## 2021-04-30 ENCOUNTER — Other Ambulatory Visit (HOSPITAL_COMMUNITY): Payer: Self-pay

## 2021-04-30 MED ORDER — IRBESARTAN 300 MG PO TABS
300.0000 mg | ORAL_TABLET | Freq: Every day | ORAL | 3 refills | Status: DC
  Filled 2021-04-30: qty 90, 90d supply, fill #0

## 2021-04-30 MED FILL — Tamsulosin HCl Cap 0.4 MG: ORAL | 90 days supply | Qty: 90 | Fill #2 | Status: AC

## 2021-04-30 NOTE — Telephone Encounter (Signed)
Approve

## 2021-04-30 NOTE — Telephone Encounter (Signed)
Patient needs to follow BP closely

## 2021-05-01 ENCOUNTER — Telehealth: Payer: Self-pay

## 2021-05-01 NOTE — Telephone Encounter (Signed)
irbesartan (AVAPRO) 300 MG tablet        Sig: Take 1 tablet by mouth daily.   Disp:  90 tablet    Refills:  3   Start: 04/30/2021   Class: Normal   Authorized by: Stephani Police, RN   Non-formulary   Pharmacy comment: RN D/C'D IN [place]      To be filled at: [facility]           Per Dr. Harrington Challenger... approved to refill and pt advised to watch his BP and keep recording for review at his next appt with Clint Fenton 05/22/21.    Medication Refill (Newest Message First) Fay Records, MD  You 13 hours ago (10:32 PM)   Patient needs to follow BP closely      Note    You routed conversation to Fay Records, MD 19 hours ago (5:05 PM)   Fay Records, MD  You 19 hours ago (4:27 PM)   Florentina Jenny

## 2021-05-21 ENCOUNTER — Ambulatory Visit (HOSPITAL_COMMUNITY): Payer: 59 | Admitting: Physician Assistant

## 2021-05-28 ENCOUNTER — Telehealth (HOSPITAL_COMMUNITY): Payer: Self-pay | Admitting: *Deleted

## 2021-05-28 NOTE — Telephone Encounter (Signed)
Patient called in stating for the last several days he has been experiencing dizziness but this morning while at work "passed out for a minute".  BP is currently 88/67 HR 75. Feeling better now. Per Adline Peals PA hold avapro until and PM dose of metoprolol follow up tomorrow. Increase PO intake, salty snack and slow sit/standing positions. Pt in agreement.

## 2021-05-29 ENCOUNTER — Other Ambulatory Visit (HOSPITAL_COMMUNITY): Payer: Self-pay

## 2021-05-29 ENCOUNTER — Other Ambulatory Visit: Payer: Self-pay

## 2021-05-29 ENCOUNTER — Ambulatory Visit (HOSPITAL_COMMUNITY)
Admission: RE | Admit: 2021-05-29 | Discharge: 2021-05-29 | Disposition: A | Discharge status: Home / Self Care | Payer: 59 | Source: Ambulatory Visit | Attending: Physician Assistant | Admitting: Physician Assistant

## 2021-05-29 VITALS — BP 168/80 | HR 65 | Ht 70.0 in | Wt 242.8 lb

## 2021-05-29 DIAGNOSIS — E785 Hyperlipidemia, unspecified: Secondary | ICD-10-CM | POA: Insufficient documentation

## 2021-05-29 DIAGNOSIS — I4819 Other persistent atrial fibrillation: Secondary | ICD-10-CM | POA: Insufficient documentation

## 2021-05-29 DIAGNOSIS — E669 Obesity, unspecified: Secondary | ICD-10-CM | POA: Diagnosis not present

## 2021-05-29 DIAGNOSIS — R0683 Snoring: Secondary | ICD-10-CM | POA: Insufficient documentation

## 2021-05-29 DIAGNOSIS — I10 Essential (primary) hypertension: Secondary | ICD-10-CM | POA: Diagnosis not present

## 2021-05-29 DIAGNOSIS — Z7901 Long term (current) use of anticoagulants: Secondary | ICD-10-CM | POA: Insufficient documentation

## 2021-05-29 DIAGNOSIS — E119 Type 2 diabetes mellitus without complications: Secondary | ICD-10-CM | POA: Diagnosis not present

## 2021-05-29 DIAGNOSIS — D6869 Other thrombophilia: Secondary | ICD-10-CM | POA: Diagnosis not present

## 2021-05-29 DIAGNOSIS — Z6834 Body mass index (BMI) 34.0-34.9, adult: Secondary | ICD-10-CM | POA: Diagnosis not present

## 2021-05-29 DIAGNOSIS — I2510 Atherosclerotic heart disease of native coronary artery without angina pectoris: Secondary | ICD-10-CM | POA: Diagnosis not present

## 2021-05-29 MED ORDER — DRONEDARONE HCL 400 MG PO TABS
400.0000 mg | ORAL_TABLET | Freq: Two times a day (BID) | ORAL | 3 refills | Status: DC
  Filled 2021-05-29: qty 60, 30d supply, fill #0
  Filled 2021-11-18: qty 60, 30d supply, fill #1

## 2021-05-29 MED ORDER — IRBESARTAN 300 MG PO TABS: 150.0000 mg | ORAL_TABLET | Freq: Every day | ORAL | 3 refills | Status: DC

## 2021-05-29 MED ORDER — METOPROLOL SUCCINATE ER 100 MG PO TB24: 50.0000 mg | ORAL_TABLET | Freq: Two times a day (BID) | ORAL | 3 refills | Status: DC

## 2021-05-29 NOTE — Patient Instructions (Signed)
Stop cardizem (diltiazem)  Decrease metoprolol to 32m twice a day  Decrease avapro to 1/2 tablet once a day

## 2021-05-29 NOTE — Progress Notes (Signed)
Primary Care Physician: Elby Showers, MD Primary Cardiologist: Dr Harrington Challenger Primary Electrophysiologist: Dr Curt Bears Referring Physician: Dr Georgena Spurling Kyle Dawson is a 56 y.o. male with a history of CAD, DM, HTN, HLD, alcohol abuse, atrial fibrillation who presents for follow up in the [facility]. He had a hospitalization in 2021 with atrial fibrillation and rapid rates.  He had been drinking drinking prior to that. Patient is on Eliquis for a CHADS2VASC score of 4. He was seen by Dr Curt Bears on 09/09/20 and was in rapid afib. His metoprolol was increased and he was started on Multaq.   On follow up today, patient is s/p afib ablation with Dr Curt Bears on 04/22/21. He has not had any episodes of heart racing since the procedure. However, he has had several episodes of dizziness and an episode of "passing out" at work. His BP at the time was 88/67 HR 75. He does have dizziness when he stands or moves his head quickly. His BB and ARB were held last night.  Today, he denies symptoms of palpitations, chest pain, shortness of breath, orthopnea, PND, lower extremity edema, presyncope, syncope, snoring, daytime somnolence, bleeding, or neurologic sequela. The patient is tolerating medications without difficulties and is otherwise without complaint today.    Atrial Fibrillation Risk Factors:  he does have symptoms or diagnosis of sleep apnea. he does not have a history of rheumatic fever. he does have a history of alcohol use.   he has a BMI of Body mass index is 34.84 kg/m.Marland Kitchen Filed Weights   05/29/21 0834  Weight: 110.1 kg      Family History  Problem Relation Age of Onset   Peripheral vascular disease Mother 31   Hypertension Mother    Heart failure Mother    CAD Maternal Grandfather    Heart failure Paternal Grandfather    Hypertension Father      Atrial Fibrillation Management history:  Previous antiarrhythmic drugs: Multaq Previous cardioversions:  none Previous ablations: 04/22/21 CHADS2VASC score: 4 Anticoagulation history: Eliquis   Past Medical History:  Diagnosis Date   Basal cell carcinoma    Chest pain    Diabetes mellitus without complication (HCC)    Edema    lower extremity   Hypertension    Squamous cell carcinoma    R ear, nose, each side of face   Past Surgical History:  Procedure Laterality Date   ATRIAL FIBRILLATION ABLATION N/A 04/22/2021   Procedure: ATRIAL FIBRILLATION ABLATION;  Surgeon: Constance Haw, MD;  Location: [facility];  Service: Cardiovascular;  Laterality: N/A;   Removal basal cell carcinoma     Removal squamous cell carcinoma      Current Outpatient Medications  Medication Sig Dispense Refill   apixaban (ELIQUIS) 5 MG TABS tablet Take 1 tablet (5 mg total) by mouth 2 (two) times daily. 180 tablet 1   glipiZIDE (GLUCOTROL) 5 MG tablet Take 1 tablet by mouth 2  times daily before a meal. 60 tablet 0   OneTouch Delica Lancets 16B MISC USE TO CHECK BLOOD SUGAR ONCE DAILY 100 each 1   POTASSIUM PO Take 1 tablet by mouth daily.     rosuvastatin (CRESTOR) 20 MG tablet Take 1 tablet (20 mg total) by mouth daily. 90 tablet 3   sildenafil (VIAGRA) 100 MG tablet TAKE 1 TABLET BY MOUTH DAILY AS NEEDED FOR ERECTILE DYSFUNCTION. (Patient taking differently: Take 100 mg by mouth as needed for erectile dysfunction.) 12 tablet  1   tamsulosin (FLOMAX) 0.4 MG CAPS capsule TAKE 1 CAPSULE BY MOUTH DAILY 90 capsule 3   torsemide (DEMADEX) 20 MG tablet TAKE 2 TABLETS BY MOUTH TWICE DAILY (Patient taking differently: Take 40 mg by mouth daily.) 120 tablet 2   dronedarone (MULTAQ) 400 MG tablet Take 1 tablet (400 mg total) by mouth 2 (two) times daily with a meal. 60 tablet 3   irbesartan (AVAPRO) 300 MG tablet Take 0.5 tablets (150 mg total) by mouth daily. 90 tablet 3   metoprolol succinate (TOPROL-XL) 100 MG 24 hr tablet Take 0.5 tablets (50 mg total) by mouth 2 (two) times daily. 135 tablet 3   No  current facility-administered medications for this encounter.    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[employer]    Employer: [employer]   Occupation: [employer]  Tobacco Use   Smoking status: Never   Smokeless tobacco: Never  Vaping Use   Vaping Use: Never used  Substance and Sexual Activity   Alcohol use: Yes    Alcohol/week: 3.0 standard drinks    Types: 3 Shots of liquor per week    Comment: 3 shots daily   Drug use: No   Sexual activity: Not on file  Other Topics Concern   Not on file  Social History Narrative   Patient drinks 1 cup of coffee a day    Social Determinants of Health   Financial Resource Strain: Not on file  Food Insecurity: Not on file  Transportation Needs: Not on file  Physical Activity: Not on file  Stress: Not on file  Social Connections: Not on file  Intimate Partner Violence: Not on file     ROS- All systems are reviewed and negative except as per the HPI above.  Physical Exam: Vitals:   05/29/21 0834  BP: (!) 168/80  Pulse: 65  Weight: 110.1 kg  Height: 5' 10"  (1.778 m)    GEN- The patient is a well appearing obese male, alert and oriented x 3 today.   HEENT-head normocephalic, atraumatic, sclera clear, conjunctiva pink, hearing intact, trachea midline. Lungs- Clear to ausculation bilaterally, normal work of breathing Heart- Regular rate and rhythm, no murmurs, rubs or gallops  GI- soft, NT, ND, + BS Extremities- no clubbing, cyanosis, or edema MS- no significant deformity or atrophy Skin- no rash or lesion Psych- euthymic mood, full affect Neuro- strength and sensation are intact   Wt Readings from Last 3 Encounters:   05/29/21 110.1 kg  04/22/21 97.5 kg  03/02/21 110.2 kg    EKG today demonstrates  SR Vent. rate 65 BPM PR interval 132 ms QRS duration 82 ms QT/QTcB 436/453 ms  Echo 05/26/19 demonstrated   1. Left ventricular ejection fraction, by visual estimation, is 60 to  65%. The left ventricle has normal function. There is moderately increased left ventricular hypertrophy.   2. Left ventricular diastolic function could not be evaluated.   3. The left ventricle has no regional wall motion abnormalities.   4. Global right ventricle was not well visualized.The right ventricular  size is not well visualized. Right vetricular  wall thickness was not  assessed.   5. Left atrial size was normal.   6. Right atrial size was normal.   7. The mitral valve is grossly normal. No evidence of mitral valve  regurgitation.   8. The tricuspid valve is grossly normal.   9. The aortic valve was not well visualized. Aortic valve regurgitation  is not visualized.  10. The pulmonic valve was grossly normal. Pulmonic valve regurgitation is not visualized.  11. 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  12. The inferior vena cava is normal in size with <50% respiratory  variability, suggesting right atrial pressure of 8 mmHg.  13. The interatrial septum was not well visualized.   Epic records are reviewed at length today  CHA2DS2-VASc Score = 4  The patient's score is based upon: CHF History: 1 HTN History: 1 Diabetes History: 1 Stroke History: 0 Vascular Disease History: 1 Age Score: 0 Gender Score: 0       ASSESSMENT AND PLAN: 1. Persistent Atrial Fibrillation (ICD10:  I48.19) The patient's CHA2DS2-VASc score is 4, indicating a 4.8% annual risk of stroke.   S/p afib ablation 04/22/22 Patient appears to be maintaining SR. Continue Eliquis 5 mg BID with no missed doses for 3 months post ablation.  Stop diltiazem 240 mg daily Decrease Toprol to 50  mg BID Continue Multaq 400 mg BID  2. Secondary Hypercoagulable State (ICD10:  D68.69) The patient is at significant risk for stroke/thromboembolism based upon his CHA2DS2-VASc Score of 4.  Continue Apixaban (Eliquis).   3. Obesity Body mass index is 34.84 kg/m. Lifestyle modification was discussed and encouraged including regular physical activity and weight reduction.  4. Snoring/suspected OSA The importance of adequate treatment of sleep apnea was discussed today in order to improve our ability to maintain sinus rhythm long term. Sleep study previously ordered but not completed.   5. CAD No anginal symptoms.  6. HTN Having symptomatic hypotension.  Will stop diltiazem and reduce Toprol as above.  Will continue irbesartan 150 mg daily.   7. Systolic dysfunction EF 70-48% No signs or symptoms of fluid overload.   Follow up in AF clinic in 2-3 weeks and with Dr Curt Bears as scheduled.    [facility] [address] [place], [place] [address] [phone] 05/29/2021 9:18 AM

## 2021-06-01 ENCOUNTER — Other Ambulatory Visit (HOSPITAL_COMMUNITY): Payer: Self-pay

## 2021-06-10 ENCOUNTER — Ambulatory Visit: Payer: Self-pay | Admitting: Endocrinology

## 2021-06-16 ENCOUNTER — Encounter: Payer: Self-pay | Admitting: Internal Medicine

## 2021-06-18 NOTE — Progress Notes (Incomplete)
Primary Care Physician: Elby Showers, MD Primary Cardiologist: Dr Harrington Challenger Primary Electrophysiologist: Dr Curt Bears Referring Physician: Dr Georgena Spurling Kyle Dawson is a 56 y.o. male with a history of CAD, DM, HTN, HLD, alcohol abuse, atrial fibrillation who presents for follow up in the [facility]. He had a hospitalization in 2021 with atrial fibrillation and rapid rates.  He had been drinking drinking prior to that. Patient is on Eliquis for a CHADS2VASC score of 4. He was seen by Dr Curt Bears on 09/09/20 and was in rapid afib. His metoprolol was increased and he was started on Multaq. Patient is s/p afib ablation with Dr Curt Bears on 04/22/21.   On follow up today, ***  Today, he denies symptoms of ***palpitations, chest pain, shortness of breath, orthopnea, PND, lower extremity edema, presyncope, syncope, snoring, daytime somnolence, bleeding, or neurologic sequela. The patient is tolerating medications without difficulties and is otherwise without complaint today.    Atrial Fibrillation Risk Factors:  he does have symptoms or diagnosis of sleep apnea. he does not have a history of rheumatic fever. he does have a history of alcohol use.   he has a BMI of There is no height or weight on file to calculate BMI.. There were no vitals filed for this visit.     Family History  Problem Relation Age of Onset   Peripheral vascular disease Mother 18   Hypertension Mother    Heart failure Mother    CAD Maternal Grandfather    Heart failure Paternal Grandfather    Hypertension Father      Atrial Fibrillation Management history:  Previous antiarrhythmic drugs: Multaq Previous cardioversions: none Previous ablations: 04/22/21 CHADS2VASC score: 4 Anticoagulation history: Eliquis   Past Medical History:  Diagnosis Date   Basal cell carcinoma    Chest pain    Diabetes mellitus without complication (HCC)    Edema    lower extremity   Hypertension     Squamous cell carcinoma    R ear, nose, each side of face   Past Surgical History:  Procedure Laterality Date   ATRIAL FIBRILLATION ABLATION N/A 04/22/2021   Procedure: ATRIAL FIBRILLATION ABLATION;  Surgeon: Constance Haw, MD;  Location: [facility];  Service: Cardiovascular;  Laterality: N/A;   Removal basal cell carcinoma     Removal squamous cell carcinoma      Current Outpatient Medications  Medication Sig Dispense Refill   apixaban (ELIQUIS) 5 MG TABS tablet Take 1 tablet (5 mg total) by mouth 2 (two) times daily. 180 tablet 1   dronedarone (MULTAQ) 400 MG tablet Take 1 tablet (400 mg total) by mouth 2 (two) times daily with a meal. 60 tablet 3   glipiZIDE (GLUCOTROL) 5 MG tablet Take 1 tablet by mouth 2  times daily before a meal. 60 tablet 0   irbesartan (AVAPRO) 300 MG tablet Take 0.5 tablets (150 mg total) by mouth daily. 90 tablet 3   metoprolol succinate (TOPROL-XL) 100 MG 24 hr tablet Take 0.5 tablets (50 mg total) by mouth 2 (two) times daily. 135 tablet 3   OneTouch Delica Lancets 16X MISC USE TO CHECK BLOOD SUGAR ONCE DAILY 100 each 1   POTASSIUM PO Take 1 tablet by mouth daily.     rosuvastatin (CRESTOR) 20 MG tablet Take 1 tablet (20 mg total) by mouth daily. 90 tablet 3   sildenafil (VIAGRA) 100 MG tablet TAKE 1 TABLET BY MOUTH DAILY AS NEEDED FOR ERECTILE DYSFUNCTION. (  Patient taking differently: Take 100 mg by mouth as needed for erectile dysfunction.) 12 tablet 1   tamsulosin (FLOMAX) 0.4 MG CAPS capsule TAKE 1 CAPSULE BY MOUTH DAILY 90 capsule 3   torsemide (DEMADEX) 20 MG tablet TAKE 2 TABLETS BY MOUTH TWICE DAILY (Patient taking differently: Take 40 mg by mouth daily.) 120 tablet 2   No current facility-administered medications for this visit.    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[employer]    Employer: [employer]   Occupation: [employer]  Tobacco Use   Smoking status: Never   Smokeless tobacco: Never  Vaping Use   Vaping Use: Never used  Substance and Sexual Activity   Alcohol use: Yes    Alcohol/week: 3.0 standard drinks    Types: 3 Shots of liquor per week    Comment: 3 shots daily   Drug use: No   Sexual activity: Not on file  Other Topics Concern   Not on file  Social History Narrative   Patient drinks 1 cup of coffee a day    Social Determinants of Health   Financial Resource Strain: Not on file  Food Insecurity: Not on file  Transportation Needs: Not on file  Physical Activity: Not on file  Stress: Not on file  Social Connections: Not on file  Intimate Partner Violence: Not on file     ROS- All systems are reviewed and negative except as per the HPI above.  Physical Exam: There were no vitals filed for this visit.  GEN- The patient is a well appearing *** {Desc; male/male:11659}, alert and oriented x 3 today.   HEENT-head normocephalic, atraumatic, sclera clear, conjunctiva pink, hearing intact, trachea midline. Lungs- Clear to ausculation bilaterally, normal work of breathing Heart- ***Regular rate and rhythm, no murmurs, rubs or gallops  GI- soft, NT, ND, + BS Extremities- no clubbing, cyanosis, or edema MS- no significant deformity or atrophy Skin- no rash or lesion Psych- euthymic mood, full affect Neuro- strength and sensation are intact   Wt Readings from Last 3 Encounters:  05/29/21 110.1 kg  04/22/21 97.5 kg  03/02/21 110.2 kg    EKG today demonstrates  ***  Echo 05/26/19 demonstrated   1. Left ventricular ejection fraction, by visual estimation, is 60 to  65%. The left ventricle has normal function. There is moderately increased left ventricular hypertrophy.   2.  Left ventricular diastolic function could not be evaluated.   3. The left ventricle has no regional wall motion abnormalities.   4. Global right ventricle was not well visualized.The right ventricular  size is not well visualized. Right vetricular wall thickness was not  assessed.   5. Left atrial size was normal.   6. Right atrial size was normal.   7. The mitral valve is grossly normal. No evidence of mitral valve  regurgitation.   8. The tricuspid valve is grossly normal.   9. The aortic valve was not well visualized. Aortic valve regurgitation  is not visualized.  10. The pulmonic valve was grossly normal. Pulmonic valve regurgitation is not visualized.  11. Left partial anomalous pulmonary  venous return. (persisent left SVC) -dilated coronary sinus.  12. The inferior vena cava is normal in size with <50% respiratory  variability, suggesting right atrial pressure of 8 mmHg.  13. The interatrial septum was not well visualized.   Epic records are reviewed at length today  CHA2DS2-VASc Score = 4  The patient's score is based upon: CHF History: 1 HTN History: 1 Diabetes History: 1 Stroke History: 0 Vascular Disease History: 1 Age Score: 0 Gender Score: 0       ASSESSMENT AND PLAN: 1. Persistent Atrial Fibrillation (ICD10:  I48.19) The patient's CHA2DS2-VASc score is 4, indicating a 4.8% annual risk of stroke.   S/p afib ablation 04/22/21 *** Continue Eliquis 5 mg BID with no missed doses for 3 months post ablation.  Continue Toprol 50 mg BID Continue Multaq 400 mg BID  2. Secondary Hypercoagulable State (ICD10:  D68.69) The patient is at significant risk for stroke/thromboembolism based upon his CHA2DS2-VASc Score of 4.  Continue Apixaban (Eliquis).   3. Obesity There is no height or weight on file to calculate BMI. Lifestyle modification was discussed and encouraged including regular physical activity and weight reduction. ***  4. Snoring/suspected OSA Sleep study  previously ordered but not completed.   5. CAD No anginal symptoms. ***  6. HTN ***Having symptomatic hypotension.  Will stop diltiazem and reduce Toprol as above.  Will continue irbesartan 150 mg daily.   7. Systolic dysfunction EF 14-43% No signs or symptoms of fluid overload. ***   Follow up ***   Adline Peals PA-C [facility] [address] [place], [place] [address] [phone] 06/18/2021 3:11 PM

## 2021-06-19 ENCOUNTER — Inpatient Hospital Stay (HOSPITAL_COMMUNITY): Admission: RE | Admit: 2021-06-19 | Payer: 59 | Source: Ambulatory Visit | Admitting: Physician Assistant

## 2021-06-26 ENCOUNTER — Other Ambulatory Visit: Payer: Self-pay

## 2021-06-26 ENCOUNTER — Other Ambulatory Visit (HOSPITAL_COMMUNITY): Payer: Self-pay

## 2021-06-26 ENCOUNTER — Encounter (HOSPITAL_COMMUNITY): Payer: Self-pay | Admitting: Physician Assistant

## 2021-06-26 ENCOUNTER — Ambulatory Visit (HOSPITAL_COMMUNITY)
Admission: RE | Admit: 2021-06-26 | Discharge: 2021-06-26 | Disposition: A | Discharge status: Home / Self Care | Payer: 59 | Source: Ambulatory Visit | Attending: Physician Assistant | Admitting: Physician Assistant

## 2021-06-26 VITALS — BP 100/70 | HR 83 | Ht 70.0 in | Wt 225.0 lb

## 2021-06-26 DIAGNOSIS — E119 Type 2 diabetes mellitus without complications: Secondary | ICD-10-CM | POA: Insufficient documentation

## 2021-06-26 DIAGNOSIS — Z6832 Body mass index (BMI) 32.0-32.9, adult: Secondary | ICD-10-CM | POA: Diagnosis not present

## 2021-06-26 DIAGNOSIS — F1010 Alcohol abuse, uncomplicated: Secondary | ICD-10-CM | POA: Diagnosis not present

## 2021-06-26 DIAGNOSIS — I10 Essential (primary) hypertension: Secondary | ICD-10-CM | POA: Diagnosis not present

## 2021-06-26 DIAGNOSIS — I4819 Other persistent atrial fibrillation: Secondary | ICD-10-CM

## 2021-06-26 DIAGNOSIS — E785 Hyperlipidemia, unspecified: Secondary | ICD-10-CM | POA: Insufficient documentation

## 2021-06-26 DIAGNOSIS — D6869 Other thrombophilia: Secondary | ICD-10-CM | POA: Diagnosis not present

## 2021-06-26 DIAGNOSIS — R0683 Snoring: Secondary | ICD-10-CM | POA: Diagnosis not present

## 2021-06-26 DIAGNOSIS — E669 Obesity, unspecified: Secondary | ICD-10-CM | POA: Insufficient documentation

## 2021-06-26 DIAGNOSIS — Z7901 Long term (current) use of anticoagulants: Secondary | ICD-10-CM | POA: Insufficient documentation

## 2021-06-26 DIAGNOSIS — I2510 Atherosclerotic heart disease of native coronary artery without angina pectoris: Secondary | ICD-10-CM | POA: Diagnosis not present

## 2021-06-26 MED ORDER — IRBESARTAN 75 MG PO TABS
75.0000 mg | ORAL_TABLET | Freq: Every day | ORAL | 3 refills | Status: DC
  Filled 2021-06-26: qty 30, 30d supply, fill #0

## 2021-06-26 NOTE — Patient Instructions (Signed)
Decrease avapro 40m once a day

## 2021-06-26 NOTE — Progress Notes (Signed)
Primary Care Physician: Elby Showers, MD Primary Cardiologist: Dr Harrington Challenger Primary Electrophysiologist: Dr Curt Bears Referring Physician: Dr Georgena Spurling Kyle Dawson is a 56 y.o. male with a history of CAD, DM, HTN, HLD, alcohol abuse, atrial fibrillation who presents for follow up in the [facility]. He had a hospitalization in 2021 with atrial fibrillation and rapid rates.  He had been drinking drinking prior to that. Patient is on Eliquis for a CHADS2VASC score of 4. He was seen by Dr Curt Bears on 09/09/20 and was in rapid afib. His metoprolol was increased and he was started on Multaq. Patient is s/p afib ablation with Dr Curt Bears on 04/22/21.   On follow up today, patient reports that his dizziness an BP have improved since his last visit. He does still have intermittent lightheadedness when standing. He remains in SR.  Today, he denies symptoms of palpitations, chest pain, shortness of breath, orthopnea, PND, lower extremity edema, presyncope, syncope, snoring, daytime somnolence, bleeding, or neurologic sequela. The patient is tolerating medications without difficulties and is otherwise without complaint today.    Atrial Fibrillation Risk Factors:  he does have symptoms or diagnosis of sleep apnea. he does not have a history of rheumatic fever. he does have a history of alcohol use.   he has a BMI of Body mass index is 32.28 kg/m.Marland Kitchen Filed Weights   06/26/21 1134  Weight: 102.1 kg       Family History  Problem Relation Age of Onset   Peripheral vascular disease Mother 56   Hypertension Mother    Heart failure Mother    CAD Maternal Grandfather    Heart failure Paternal Grandfather    Hypertension Father      Atrial Fibrillation Management history:  Previous antiarrhythmic drugs: Multaq Previous cardioversions: none Previous ablations: 04/22/21 CHADS2VASC score: 4 Anticoagulation history: Eliquis   Past Medical History:  Diagnosis Date    Basal cell carcinoma    Chest pain    Diabetes mellitus without complication (HCC)    Edema    lower extremity   Hypertension    Squamous cell carcinoma    R ear, nose, each side of face   Past Surgical History:  Procedure Laterality Date   ATRIAL FIBRILLATION ABLATION N/A 04/22/2021   Procedure: ATRIAL FIBRILLATION ABLATION;  Surgeon: Constance Haw, MD;  Location: [facility];  Service: Cardiovascular;  Laterality: N/A;   Removal basal cell carcinoma     Removal squamous cell carcinoma      Current Outpatient Medications  Medication Sig Dispense Refill   apixaban (ELIQUIS) 5 MG TABS tablet Take 1 tablet (5 mg total) by mouth 2 (two) times daily. 180 tablet 1   dronedarone (MULTAQ) 400 MG tablet Take 1 tablet (400 mg total) by mouth 2 (two) times daily with a meal. 60 tablet 3   glipiZIDE (GLUCOTROL) 5 MG tablet Take 1 tablet by mouth 2  times daily before a meal. 60 tablet 0   metoprolol succinate (TOPROL-XL) 100 MG 24 hr tablet Take 0.5 tablets (50 mg total) by mouth 2 (two) times daily. 135 tablet 3   OneTouch Delica Lancets 06C MISC USE TO CHECK BLOOD SUGAR ONCE DAILY 100 each 1   POTASSIUM PO Take 1 tablet by mouth daily.     rosuvastatin (CRESTOR) 20 MG tablet Take 1 tablet (20 mg total) by mouth daily. 90 tablet 3   sildenafil (VIAGRA) 100 MG tablet TAKE 1 TABLET BY MOUTH DAILY AS  NEEDED FOR ERECTILE DYSFUNCTION. (Patient taking differently: Take 100 mg by mouth as needed for erectile dysfunction.) 12 tablet 1   tamsulosin (FLOMAX) 0.4 MG CAPS capsule TAKE 1 CAPSULE BY MOUTH DAILY 90 capsule 3   torsemide (DEMADEX) 20 MG tablet TAKE 2 TABLETS BY MOUTH TWICE DAILY 120 tablet 2   irbesartan (AVAPRO) 75 MG tablet Take 1 tablet (75 mg total) by mouth daily. 30 tablet 3   No current facility-administered medications for this encounter.    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: [employer]   Occupation: [employer]  Tobacco Use   Smoking status: Never   Smokeless tobacco: Never  Vaping Use   Vaping Use: Never used  Substance and Sexual Activity   Alcohol use: Yes    Alcohol/week: 3.0 - 6.0 standard drinks    Types: 3 - 6 Shots of liquor per week    Comment: 1-2 shots every 2-3 days 06/26/2021   Drug use: No   Sexual activity: Not on file  Other Topics Concern   Not on file  Social History Narrative   Patient drinks 1 cup of coffee a day    Social Determinants of Health   Financial Resource Strain: Not on file  Food Insecurity: Not on file  Transportation Needs: Not on file  Physical Activity: Not on file  Stress: Not on file  Social Connections: Not on file  Intimate Partner Violence: Not on file     ROS- All systems are reviewed and negative except as per the HPI above.  Physical Exam: Vitals:   06/26/21 1134  BP: 100/70  Pulse: 83  Weight: 102.1 kg  Height: 5' 10"  (1.778 m)    GEN- The patient is a well appearing obese male, alert and oriented x 3 today.   HEENT-head normocephalic, atraumatic, sclera clear, conjunctiva pink, hearing intact, trachea midline. Lungs- Clear to ausculation bilaterally, normal work of breathing Heart- Regular rate and rhythm, no murmurs, rubs or gallops  GI- soft, NT, ND, + BS Extremities- no clubbing, cyanosis, or edema MS- no significant deformity or atrophy Skin- no rash or lesion Psych- euthymic mood, full affect Neuro- strength and sensation are intact   Wt Readings from Last 3 Encounters:  06/26/21 102.1 kg  05/29/21 110.1 kg  04/22/21 97.5 kg    EKG today demonstrates  SR, NST Vent. rate 83 BPM PR interval 128 ms QRS duration 80  ms QT/QTcB 380/446 ms  Echo 05/26/19 demonstrated   1. Left ventricular ejection fraction, by visual estimation, is 60 to  65%. The left ventricle has normal function. There is moderately increased left ventricular hypertrophy.   2. Left ventricular diastolic function could not be evaluated.   3. The left ventricle has no regional wall motion abnormalities.   4. Global right ventricle was not well visualized.The right ventricular  size is not well visualized. Right vetricular wall thickness was not  assessed.   5. Left atrial size was normal.   6. Right atrial size was normal.   7. The mitral valve is grossly normal. No evidence  of mitral valve  regurgitation.   8. The tricuspid valve is grossly normal.   9. The aortic valve was not well visualized. Aortic valve regurgitation  is not visualized.  10. The pulmonic valve was grossly normal. Pulmonic valve regurgitation is not visualized.  11. 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  12. The inferior vena cava is normal in size with <50% respiratory  variability, suggesting right atrial pressure of 8 mmHg.  13. The interatrial septum was not well visualized.   Epic records are reviewed at length today  CHA2DS2-VASc Score = 4  The patient's score is based upon: CHF History: 1 HTN History: 1 Diabetes History: 1 Stroke History: 0 Vascular Disease History: 1 Age Score: 0 Gender Score: 0       ASSESSMENT AND PLAN: 1. Persistent Atrial Fibrillation (ICD10:  I48.19) The patient's CHA2DS2-VASc score is 4, indicating a 4.8% annual risk of stroke.   S/p afib ablation 04/22/21 Patient appears to be maintaining SR. Continue Eliquis 5 mg BID with no missed doses for 3 months post ablation.  Continue Toprol 50 mg BID Continue Multaq 400 mg BID  2. Secondary Hypercoagulable State (ICD10:  D68.69) The patient is at significant risk for stroke/thromboembolism based upon his CHA2DS2-VASc  Score of 4.  Continue Apixaban (Eliquis).   3. Obesity Body mass index is 32.28 kg/m. Lifestyle modification was discussed and encouraged including regular physical activity and weight reduction.  4. Snoring/suspected OSA Sleep study previously ordered but not completed.   5. CAD No anginal symptoms.  6. HTN BP still borderline today with intermittent lightheadedness. Decrease irbesartan to 75 mg daily  7. Systolic dysfunction EF 69-62% No signs or symptoms of fluid overload.   Follow up with Dr Curt Bears as scheduled.    [facility] [address] [place], [place] [address] [phone] 06/26/2021 12:05 PM

## 2021-07-22 ENCOUNTER — Encounter: Payer: Self-pay | Admitting: Internal Medicine

## 2021-07-24 ENCOUNTER — Ambulatory Visit: Payer: 59 | Admitting: Cardiology

## 2021-07-24 NOTE — Progress Notes (Deleted)
? ?Electrophysiology Office Note ? ? ?Date:  07/24/2021  ? ?ID:  Kyle Dawson, DOB 1965-06-11, MRN [id number] ? ?PCP:  Kyle Showers, MD  ?Cardiologist:  Harrington Challenger ?Primary Electrophysiologist:  Owain Eckerman Meredith Leeds, MD   ? ?Chief Complaint: AF ?  ?History of Present Illness: ?Kyle Dawson is a 56 y.o. male who is being seen today for the evaluation of AF at the request of Baxley, Cresenciano Lick, MD. Presenting today for electrophysiology evaluation. ? ?He has a history of coronary artery disease, diabetes, hypertension, hyperlipidemia, alcohol abuse, and atrial fibrillation.  He was hospitalized in May 2021 with rapid atrial fibrillation.  He had been drinking prior to that.  He is currently on Eliquis.  He wore a recent Zio patch that showed a 100% atrial fibrillation burden.  He was also found to have an ejection fraction of 40-45%. ? ?Today, denies symptoms of palpitations, chest pain, shortness of breath, orthopnea, PND, lower extremity edema, claudication, dizziness, presyncope, syncope, bleeding, or neurologic sequela. The patient is tolerating medications without difficulties.  He currently feels well.  He has no chest pain or shortness of breath.  Unfortunately he did go back into atrial fibrillation and is now status post cardioversion 02/26/2021.  He is tired of going in and out of atrial fibrillation continue to have issues with his arrhythmia.  He would also prefer to avoid further medications.  He would prefer ablation. ? ? ?Past Medical History:  ?Diagnosis Date  ? Basal cell carcinoma   ? Chest pain   ? Diabetes mellitus without complication ([place])   ? Edema   ? lower extremity  ? Hypertension   ? Squamous cell carcinoma   ? R ear, nose, each side of face  ? ?Past Surgical History:  ?Procedure Laterality Date  ? ATRIAL FIBRILLATION ABLATION N/A 04/22/2021  ? Procedure: ATRIAL FIBRILLATION ABLATION;  Surgeon: Constance Haw, MD;  Location: [facility];  Service: Cardiovascular;  Laterality: N/A;   ? Removal basal cell carcinoma    ? Removal squamous cell carcinoma    ? ? ? ?Current Outpatient Medications  ?Medication Sig Dispense Refill  ? apixaban (ELIQUIS) 5 MG TABS tablet Take 1 tablet (5 mg total) by mouth 2 (two) times daily. 180 tablet 1  ? dronedarone (MULTAQ) 400 MG tablet Take 1 tablet (400 mg total) by mouth 2 (two) times daily with a meal. 60 tablet 3  ? glipiZIDE (GLUCOTROL) 5 MG tablet Take 1 tablet by mouth 2  times daily before a meal. 60 tablet 0  ? irbesartan (AVAPRO) 75 MG tablet Take 1 tablet (75 mg total) by mouth daily. 30 tablet 3  ? metoprolol succinate (TOPROL-XL) 100 MG 24 hr tablet Take 0.5 tablets (50 mg total) by mouth 2 (two) times daily. 135 tablet 3  ? OneTouch Delica Lancets 42H MISC USE TO CHECK BLOOD SUGAR ONCE DAILY 100 each 1  ? POTASSIUM PO Take 1 tablet by mouth daily.    ? rosuvastatin (CRESTOR) 20 MG tablet Take 1 tablet (20 mg total) by mouth daily. 90 tablet 3  ? sildenafil (VIAGRA) 100 MG tablet TAKE 1 TABLET BY MOUTH DAILY AS NEEDED FOR ERECTILE DYSFUNCTION. (Patient taking differently: Take 100 mg by mouth as needed for erectile dysfunction.) 12 tablet 1  ? tamsulosin (FLOMAX) 0.4 MG CAPS capsule TAKE 1 CAPSULE BY MOUTH DAILY 90 capsule 3  ? torsemide (DEMADEX) 20 MG tablet TAKE 2 TABLETS BY MOUTH TWICE DAILY 120 tablet 2  ? ?No  current facility-administered medications for this visit.  ? ? ?Allergies:   Oxycodone  ? ?Social History:  The patient  reports that he has never smoked. He has never used smokeless tobacco. He reports current alcohol use of about 3.0 - 6.0 standard drinks per week. He reports that he does not use drugs.  ? ?Family History:  The patient's family history includes CAD in his maternal grandfather; Heart failure in his mother and paternal grandfather; Hypertension in his father and mother; Peripheral vascular disease (age of onset: 47) in his mother.  ? ?ROS:  Please see the history of present illness.   Otherwise, review of systems is  positive for none.   All other systems are reviewed and negative.  ? ?PHYSICAL EXAM: ?VS:  There were no vitals taken for this visit. , BMI There is no height or weight on file to calculate BMI. ?GEN: Well nourished, well developed, in no acute distress  ?HEENT: normal  ?Neck: no JVD, carotid bruits, or masses ?Cardiac: RRR; no murmurs, rubs, or gallops,no edema  ?Respiratory:  clear to auscultation bilaterally, normal work of breathing ?GI: soft, nontender, nondistended, + BS ?MS: no deformity or atrophy  ?Skin: warm and dry ?Neuro:  Strength and sensation are intact ?Psych: euthymic mood, full affect ? ?EKG:  EKG is not ordered today. ?Personal review of the ekg ordered 10*20*22 shows sinus rhythm, rate 55 ? ?Recent Labs: ?12/05/2020: Brain Natriuretic Peptide 232 ?12/11/2020: Magnesium 1.7 ?01/16/2021: ALT 34 ?04/16/2021: BUN 13; Creatinine, Ser 1.25; Hemoglobin 15.6; Platelets 228; Potassium 4.8; Sodium 134  ? ? ?Lipid Panel  ?   ?Component Value Date/Time  ? CHOL 190 04/16/2021 0755  ? TRIG 100 04/16/2021 0755  ? HDL 69 04/16/2021 0755  ? CHOLHDL 2.8 04/16/2021 0755  ? CHOLHDL 3.4 05/16/2020 0920  ? VLDL 28 05/26/2019 0434  ? [place] 103 (H) 04/16/2021 0755  ? [place] 105 (H) 05/16/2020 0920  ? ? ? ?Wt Readings from Last 3 Encounters:  ?06/26/21 225 lb (102.1 kg)  ?05/29/21 242 lb 12.8 oz (110.1 kg)  ?04/22/21 215 lb (97.5 kg)  ?  ? ? ?Other studies Reviewed: ?Additional studies/ records that were reviewed today include: TTE 02/05/21  ?Review of the above records today demonstrates:  ? 1. Left ventricular ejection fraction, by estimation, is 40 to 45%. The  ?left ventricle has mildly decreased function. The left ventricle  ?demonstrates global hypokinesis. Left ventricular diastolic parameters are  ?consistent with Grade I diastolic  ?dysfunction (impaired relaxation).  ? 2. Right ventricular systolic function is normal. The right ventricular  ?size is normal.  ? 3. The mitral valve is normal in structure. Mild mitral  valve  ?regurgitation. No evidence of mitral stenosis.  ? 4. The aortic valve is normal in structure. Aortic valve regurgitation is  ?not visualized. No aortic stenosis is present.  ? 5. The inferior vena cava is normal in size with greater than 50%  ?respiratory variability, suggesting right atrial pressure of 3 mmHg. ? ?Cardiac monitor 02/06/2021 personally reviewed ?100% atrial fibrillation burden rage 57-208 bpm ?Avg 128 bpm ?Rare ventricular and supraventricular ectopy ? ?ASSESSMENT AND PLAN: ? ?1.  Persistent atrial fibrillation: Currently on Toprol-XL 50 mg daily, Multaq 400 mg twice daily, Eliquis 5 mg twice daily.  High risk medication monitoring for Multaq.  CHA2DS2-VASc of 4.  He is now status post atrial fibrillation ablation on 04/22/2021.*** ? ?2.  Coronary artery disease: Found on coronary CT.  Plan for medical management. ? ?3.  Hypertension:*** ? ?  4.  Hyperlipidemia: Continue Crestor 20 mg per primary cardiology ? ?5.  Alcohol abuse: Complete cessation encouraged ? ?6.  Obesity: There is no height or weight on file to calculate BMI. ?Diet and exercise encouraged ? ?7.  Snoring: OSA suspected.  Leevon Upperman need sleep study. ? ?8.  Secondary hypercoagulable state: Continue Eliquis for atrial fibrillation as above. ? ?Current medicines are reviewed at length with the patient today.   ?The patient does not have concerns regarding his medicines.  The following changes were made today: *** ? ?Labs/ tests ordered today include:  ?No orders of the defined types were placed in this encounter. ? ? ? ? ?Disposition:   FU with Salihah Peckham *** months ? ?Signed, ?Kiyona Mcnall Meredith Leeds, MD  ?07/24/2021 9:11 AM    ? ?[facility] ?[address] ?[address] ?[place] [address] ?[phone] (office) ?[phone] (fax) ? ?

## 2021-11-18 ENCOUNTER — Other Ambulatory Visit (HOSPITAL_COMMUNITY): Payer: Self-pay

## 2021-11-23 ENCOUNTER — Other Ambulatory Visit (HOSPITAL_COMMUNITY): Payer: Self-pay

## 2021-11-24 ENCOUNTER — Other Ambulatory Visit: Payer: Self-pay | Admitting: Internal Medicine

## 2021-11-24 ENCOUNTER — Other Ambulatory Visit (HOSPITAL_COMMUNITY): Payer: Self-pay

## 2021-11-24 MED ORDER — TAMSULOSIN HCL 0.4 MG PO CAPS
ORAL_CAPSULE | Freq: Every day | ORAL | 3 refills | Status: AC
  Filled 2021-11-24: qty 90, 90d supply, fill #0

## 2021-11-30 ENCOUNTER — Other Ambulatory Visit (HOSPITAL_COMMUNITY): Payer: Self-pay

## 2021-12-02 ENCOUNTER — Other Ambulatory Visit (HOSPITAL_COMMUNITY): Payer: Self-pay

## 2021-12-04 ENCOUNTER — Other Ambulatory Visit (HOSPITAL_COMMUNITY): Payer: Self-pay

## 2022-06-17 ENCOUNTER — Encounter (HOSPITAL_COMMUNITY): Payer: Self-pay | Admitting: *Deleted

## 2022-11-15 ENCOUNTER — Other Ambulatory Visit: Payer: Self-pay | Admitting: Internal Medicine

## 2022-11-15 ENCOUNTER — Other Ambulatory Visit (HOSPITAL_COMMUNITY): Payer: Self-pay

## 2022-11-19 LAB — LAB REPORT - SCANNED: A1c: 8.2

## 2022-11-20 ENCOUNTER — Other Ambulatory Visit (HOSPITAL_COMMUNITY): Payer: Self-pay

## 2022-11-25 ENCOUNTER — Encounter: Payer: Self-pay | Admitting: Internal Medicine

## 2022-11-25 ENCOUNTER — Telehealth: Payer: Self-pay | Admitting: Internal Medicine

## 2022-11-25 NOTE — Telephone Encounter (Addendum)
Tukker Byrns [phone]  [name] left a voice mail that he needs an appointment next week, I am assuming it is because his below medication was denied.  Last office visit here was 01/16/2021  We mailed letter to make appt for CPE on 07/22/2021 and received no response   torsemide (DEMADEX) 20 MG tablet   I have spoken with patient by phone. He is currently in [place].He was hospitalized there recently.  He is NOT out of medication. I told him I cannot call in Rx to [place] as I am not licensed there. He understands this. He is returning home soon.He says he has been looking after family member in poor health there. Advised patient to call [facility] for an appt  to be seen there in the  very near future when he will be here in [place]. I am also advising him that we are discharging him from our practice for lack of follow up since Sept 2022.

## 2022-11-26 ENCOUNTER — Encounter: Payer: Self-pay | Admitting: Internal Medicine

## 2022-11-26 NOTE — Telephone Encounter (Signed)
Dismissal letter mailed

## 2022-12-13 ENCOUNTER — Telehealth: Payer: Self-pay | Admitting: Internal Medicine

## 2022-12-13 NOTE — Telephone Encounter (Signed)
This was sent via mychart to the scheduling pool:    Hello,  I recently was in [place] July 7-December 01, 2022.  At the time I had 4+ leg and pedal edema and SOB.    I went to the Hospital there, in [place], July 11-July 14.  I entered at 56 # with again edema and SOB.  They tried Lasix IV 120 mg over a 18-hour period and got 1.2 liters of fluid out.  The next day they put me on Tamulosin 80 mg and I urinated 7.8 L in 12 hours and weighed 237.  The tamulosin worked but I have since run out of it.  Presently I am not gaining any weight and do not have SOB.  However, I have noticed edema around my ankle and was concerned that without my fluid medication this might increase and I would be back in the same situation as I was in [place].  I am not sure ya'll can prescribe me the medication, and I do have an appointment next week.  I just don't want to come this far and revert back to where I was.  Presently I am weighing on my home scale at 211 and that has been steady for about 9 days now.  I have been taking 40 mg Tamulosin and I am urinating but mainly about for about 4-6 hours about an hour after taking the medication.  I am in the process of getting my [place] records together and I will attempt to send them to you through MyChart if not I will bring them with me.  Not sure if you would be able to access them from [place].  I stayed at [facility].  Thanks for returning my message.  Kyle Dawson [name],  Can you tell me a little about what is going on?     If swelling, where is the swelling located?   How much weight have you gained and in what time span?   Have you gained 2 pounds in a day or 5 pounds in a week?   Do you have a log of your daily weights (if so, list)?   Are you currently taking a fluid pill?   Are you currently SOB?   Have you traveled recently in a car or plane for an extended period of time?    ----- Message -----       From:Avik Ancil Linsey      Sent:12/13/2022 10:07 AM EDT        NG:[id number] Appointment Schedule Request Message List   Subject:Appointment Scheduled  Good Day,  I was wondering if I could get a message to Ronie Spies in regards to my condition and my appointment.  Either that or the Nurse?  Thank you,   Raford Pitcher    Appt Status: Scheduled    Appt Instructions:    Please arrive 15 minutes prior to your appointment. This will allow [place] to  verify and update your medical record and ensure a full appointment for you  within the time allotted.

## 2022-12-13 NOTE — Telephone Encounter (Signed)
Spoke with patient regarding his message below. Patient corrected himself and said he is taking Torsemide 40mg  daily and is out of this medication.  Patient would like to know if Dr. Tenny Craw would be agreeable to refill this medication for him to prevent fluid overload. He has an appt with Ronie Spies, PA-C on 12/20/22. He was last seen by Dr. Tenny Craw on 06/30/2020.  He reports he has not had any torsemide today and notes his ankles/feet are starting to swell a bit.  Will forward to Dr. Tenny Craw to review and advise.

## 2022-12-15 ENCOUNTER — Other Ambulatory Visit (HOSPITAL_COMMUNITY): Payer: Self-pay

## 2022-12-15 ENCOUNTER — Other Ambulatory Visit: Payer: Self-pay

## 2022-12-15 MED ORDER — TORSEMIDE 20 MG PO TABS
40.0000 mg | ORAL_TABLET | Freq: Every day | ORAL | 2 refills | Status: DC
  Filled 2022-12-15 (×2): qty 180, 90d supply, fill #0

## 2022-12-15 NOTE — Telephone Encounter (Signed)
Spoke with patient, informed him Dr. Tenny Craw is agreeable to sending in Rx for Torsemide 40mg  daily. Rx sent to [facility]. Patient verbalized understanding and expressed appreciation for follow-up.

## 2022-12-17 NOTE — Progress Notes (Signed)
Cardiology Office Note:  .   Date:  12/20/2022  ID:  Kyle Dawson, DOB 1966-04-21, MRN [id number] PCP: No primary care provider on file.  [facility] Providers Cardiologist:  Dietrich Pates, MD Electrophysiologist:  Will Jorja Loa, MD    History of Present Illness: .   Kyle Dawson is a 57 y.o. male with past medical history of CAD by CT, PAF s/p prior ablations 04/2021, TIA, DM, HTN, HLD, ETOH use, CKD stage 2 by prior labs, hepatic steatosis, prolonged QT, cardiomyopathy of undetermined etiology and chronic HFrEF.   In 12/2017 he presented to the emergency room with chest discomfort that was atypical.  Coronary CTA showed nonobstructive CAD with 50 to 75% proximal to mid LAD lesion involving D2 with negative FFR.  Echocardiogram at that time indicated EF 55 to 60%, moderate LVH, small to moderate pericardial effusion.  On 09/2018 he presented to the emergency room and was found to be in atrial fibrillation with RVR.  While inpatient he had spontaneous conversion from atrial fibrillation back to normal sinus rhythm.  On echocardiogram at that time he was noted to have moderate to severe LVH consistent with hypertensive heart disease.  He reported running out of his blood pressure medications 2 weeks prior to event. Over subsequent years, he had additional issues with persistent atrial fibrillation despite DCCV as well as echo in 01/2021 demonstrating decline in LVEF to 40-45%. Therefore he underwent ablation with Dr. Elberta Fortis on 04/22/21. He was supposed to follow-up with EP 3 months thereafter, but has not been seen by [facility] since then. He was last seen in clinic on 06/26/2021 by Alphonzo Severance, PA for follow up regarding intermittent hypotension so irbesartan was decreased.   Per patient in May/June he started noting heart rates in the 120's-140's on pulse ox. He started taking his mothers sotalol in addition to [place]. He also started noting increased lower extremity edema, started  taking his mothers furosemide. He did present to an urgent care for evaluation, requested Torsemide, it was recommended he present to the emergency room, he declined at that time.   On chart review he presented to [facility] on 11/18/22 for increased shortness of breath and lower extremity edema.  He was admitted for acute exacerbation of CHF and AKI. BNP 2862. Troponins were negative. Not on anticoagulation prior to admission, was restarted on Eliquis, noted he had not been taking multiple medications due to cost. Sotalol and dronderone were discontinued during admission for prolonged QTC. AKI resolved while inpatient, was transitioned from Lasix to Torsemide. Echocardiogram indicated EF 35-40%. Normal RV. Mild MR/TR Normal atrial sizes. RVSP . He was discharged on 11/21/22 with recommendation of close follow up with cardiology.   On 12/13/2022 he notified the office of 4+ leg and pedal edema as well as shortness of breath while vacationing in [place] in early July, he was starting to run out of Torsemide. Dr. Tenny Craw provided prescription of Torsemide 20mg  daily.   Today he presents for follow-up regarding lower extremity edema. Reports he is now doing better overall, notes slight edema in his ankles, denies shortness of breath. He denies chest pain, orthopnea, pnd. He does report significant history of alcohol abuse, up until 07/15/22 he reports drinking a quart of bourbon a night, states he has since stopped drinking. 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   ROS:  Today he denies chest pain, fatigue, palpitations, melena, hematuria, hemoptysis, diaphoresis, weakness,  presyncope, syncope, orthopnea, and PND.   Studies Reviewed: Marland Kitchen   EKG Interpretation Date/Time:  Monday December 20 2022 08:57:29 EDT Ventricular Rate:  58 PR Interval:  142 QRS Duration:  76 QT Interval:  430 QTC Calculation: 422 R Axis:   4  Text Interpretation: Sinus bradycardia No  acute changes Confirmed by Reather Littler [phone]) on 12/20/2022 4:56:53 PM   Cardiac Studies & Procedures       ECHOCARDIOGRAM  ECHOCARDIOGRAM COMPLETE 02/05/2021  Narrative ECHOCARDIOGRAM REPORT    Patient Name:   Kyle Dawson Date of Exam: 02/05/2021 Medical Rec #:  [id number]        Height:       71.0 in Accession #:    [id number]       Weight:       250.0 lb Date of Birth:  1965-06-29        BSA:          2.318 m Patient Age:    55 years         BP:           108/60 mmHg Patient Gender: M                HR:           86 bpm. Exam Location:  [address]  Procedure: 2D Echo, Cardiac Doppler, Color Doppler and Intracardiac Opacification Agent  Indications:    I48.91* Unspecified atrial fibrillation  History:        Patient has prior history of Echocardiogram examinations, most recent 05/26/2019. TIA, Signs/Symptoms:Chest Pain and Shortness of Breath; Risk Factors:Diabetes. Obesity. Hypertensive heart disease.  Sonographer:    Cathie Beams RCS Referring Phys: [id number] MICHAEL ANDREW TILLERY  IMPRESSIONS   1. Left ventricular ejection fraction, by estimation, is 40 to 45%. The left ventricle has mildly decreased function. The left ventricle demonstrates global hypokinesis. Left ventricular diastolic parameters are consistent with Grade I diastolic dysfunction (impaired relaxation). 2. Right ventricular systolic function is normal. The right ventricular size is normal. 3. The mitral valve is normal in structure. Mild mitral valve regurgitation. No evidence of mitral stenosis. 4. The aortic valve is normal in structure. Aortic valve regurgitation is not visualized. No aortic stenosis is present. 5. The inferior vena cava is normal in size with greater than 50% respiratory variability, suggesting right atrial pressure of 3 mmHg.  FINDINGS Left Ventricle: Left ventricular ejection fraction, by estimation, is 40 to 45%. The left ventricle has mildly decreased function. The left  ventricle demonstrates global hypokinesis. Definity contrast agent was given IV to delineate the left ventricular endocardial borders. The left ventricular internal cavity size was normal in size. There is no left ventricular hypertrophy. Left ventricular diastolic parameters are consistent with Grade I diastolic dysfunction (impaired relaxation).  Right Ventricle: The right ventricular size is normal. No increase in right ventricular wall thickness. Right ventricular systolic function is normal.  Left Atrium: Left atrial size was normal in size.  Right Atrium: Right atrial size was normal in size.  Pericardium: There is no evidence of pericardial effusion.  Mitral Valve: The mitral valve is normal in structure. Mild mitral valve regurgitation. No evidence of mitral valve stenosis.  Tricuspid Valve: The tricuspid valve is normal in structure. Tricuspid valve regurgitation is not demonstrated. No evidence of tricuspid stenosis.  Aortic Valve: The aortic valve is normal in structure. Aortic valve regurgitation is not visualized. No aortic stenosis is present.  Pulmonic Valve: The pulmonic valve was normal in

## 2022-12-17 NOTE — H&P (View-Only) (Signed)
Cardiology Office Note:  .   Date:  12/20/2022  ID:  Kyle Dawson, DOB 1966-04-21, MRN [id number] PCP: No primary care provider on file.  [facility] Providers Cardiologist:  Dietrich Pates, MD Electrophysiologist:  Will Jorja Loa, MD    History of Present Illness: .   Kyle Dawson is a 57 y.o. male with past medical history of CAD by CT, PAF s/p prior ablations 04/2021, TIA, DM, HTN, HLD, ETOH use, CKD stage 2 by prior labs, hepatic steatosis, prolonged QT, cardiomyopathy of undetermined etiology and chronic HFrEF.   In 12/2017 he presented to the emergency room with chest discomfort that was atypical.  Coronary CTA showed nonobstructive CAD with 50 to 75% proximal to mid LAD lesion involving D2 with negative FFR.  Echocardiogram at that time indicated EF 55 to 60%, moderate LVH, small to moderate pericardial effusion.  On 09/2018 he presented to the emergency room and was found to be in atrial fibrillation with RVR.  While inpatient he had spontaneous conversion from atrial fibrillation back to normal sinus rhythm.  On echocardiogram at that time he was noted to have moderate to severe LVH consistent with hypertensive heart disease.  He reported running out of his blood pressure medications 2 weeks prior to event. Over subsequent years, he had additional issues with persistent atrial fibrillation despite DCCV as well as echo in 01/2021 demonstrating decline in LVEF to 40-45%. Therefore he underwent ablation with Dr. Elberta Fortis on 04/22/21. He was supposed to follow-up with EP 3 months thereafter, but has not been seen by [facility] since then. He was last seen in clinic on 06/26/2021 by Alphonzo Severance, PA for follow up regarding intermittent hypotension so irbesartan was decreased.   Per patient in May/June he started noting heart rates in the 120's-140's on pulse ox. He started taking his mothers sotalol in addition to [place]. He also started noting increased lower extremity edema, started  taking his mothers furosemide. He did present to an urgent care for evaluation, requested Torsemide, it was recommended he present to the emergency room, he declined at that time.   On chart review he presented to [facility] on 11/18/22 for increased shortness of breath and lower extremity edema.  He was admitted for acute exacerbation of CHF and AKI. BNP 2862. Troponins were negative. Not on anticoagulation prior to admission, was restarted on Eliquis, noted he had not been taking multiple medications due to cost. Sotalol and dronderone were discontinued during admission for prolonged QTC. AKI resolved while inpatient, was transitioned from Lasix to Torsemide. Echocardiogram indicated EF 35-40%. Normal RV. Mild MR/TR Normal atrial sizes. RVSP . He was discharged on 11/21/22 with recommendation of close follow up with cardiology.   On 12/13/2022 he notified the office of 4+ leg and pedal edema as well as shortness of breath while vacationing in [place] in early July, he was starting to run out of Torsemide. Dr. Tenny Craw provided prescription of Torsemide 20mg  daily.   Today he presents for follow-up regarding lower extremity edema. Reports he is now doing better overall, notes slight edema in his ankles, denies shortness of breath. He denies chest pain, orthopnea, pnd. He does report significant history of alcohol abuse, up until 07/15/22 he reports drinking a quart of bourbon a night, states he has since stopped drinking. 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   ROS:  Today he denies chest pain, fatigue, palpitations, melena, hematuria, hemoptysis, diaphoresis, weakness,  presyncope, syncope, orthopnea, and PND.   Studies Reviewed: Marland Kitchen   EKG Interpretation Date/Time:  Monday December 20 2022 08:57:29 EDT Ventricular Rate:  58 PR Interval:  142 QRS Duration:  76 QT Interval:  430 QTC Calculation: 422 R Axis:   4  Text Interpretation: Sinus bradycardia No  acute changes Confirmed by Reather Littler [phone]) on 12/20/2022 4:56:53 PM   Cardiac Studies & Procedures       ECHOCARDIOGRAM  ECHOCARDIOGRAM COMPLETE 02/05/2021  Narrative ECHOCARDIOGRAM REPORT    Patient Name:   Kyle Dawson Date of Exam: 02/05/2021 Medical Rec #:  [id number]        Height:       71.0 in Accession #:    [id number]       Weight:       250.0 lb Date of Birth:  1965-06-29        BSA:          2.318 m Patient Age:    55 years         BP:           108/60 mmHg Patient Gender: M                HR:           86 bpm. Exam Location:  [address]  Procedure: 2D Echo, Cardiac Doppler, Color Doppler and Intracardiac Opacification Agent  Indications:    I48.91* Unspecified atrial fibrillation  History:        Patient has prior history of Echocardiogram examinations, most recent 05/26/2019. TIA, Signs/Symptoms:Chest Pain and Shortness of Breath; Risk Factors:Diabetes. Obesity. Hypertensive heart disease.  Sonographer:    Cathie Beams RCS Referring Phys: [id number] MICHAEL ANDREW TILLERY  IMPRESSIONS   1. Left ventricular ejection fraction, by estimation, is 40 to 45%. The left ventricle has mildly decreased function. The left ventricle demonstrates global hypokinesis. Left ventricular diastolic parameters are consistent with Grade I diastolic dysfunction (impaired relaxation). 2. Right ventricular systolic function is normal. The right ventricular size is normal. 3. The mitral valve is normal in structure. Mild mitral valve regurgitation. No evidence of mitral stenosis. 4. The aortic valve is normal in structure. Aortic valve regurgitation is not visualized. No aortic stenosis is present. 5. The inferior vena cava is normal in size with greater than 50% respiratory variability, suggesting right atrial pressure of 3 mmHg.  FINDINGS Left Ventricle: Left ventricular ejection fraction, by estimation, is 40 to 45%. The left ventricle has mildly decreased function. The left  ventricle demonstrates global hypokinesis. Definity contrast agent was given IV to delineate the left ventricular endocardial borders. The left ventricular internal cavity size was normal in size. There is no left ventricular hypertrophy. Left ventricular diastolic parameters are consistent with Grade I diastolic dysfunction (impaired relaxation).  Right Ventricle: The right ventricular size is normal. No increase in right ventricular wall thickness. Right ventricular systolic function is normal.  Left Atrium: Left atrial size was normal in size.  Right Atrium: Right atrial size was normal in size.  Pericardium: There is no evidence of pericardial effusion.  Mitral Valve: The mitral valve is normal in structure. Mild mitral valve regurgitation. No evidence of mitral valve stenosis.  Tricuspid Valve: The tricuspid valve is normal in structure. Tricuspid valve regurgitation is not demonstrated. No evidence of tricuspid stenosis.  Aortic Valve: The aortic valve is normal in structure. Aortic valve regurgitation is not visualized. No aortic stenosis is present.  Pulmonic Valve: The pulmonic valve was normal in  structure. Pulmonic valve regurgitation is not visualized. No evidence of pulmonic stenosis.  Aorta: The aortic root is normal in size and structure.  Venous: The inferior vena cava is normal in size with greater than 50% respiratory variability, suggesting right atrial pressure of 3 mmHg.  IAS/Shunts: No atrial level shunt detected by color flow Doppler.   LEFT VENTRICLE PLAX 2D LVIDd:         5.20 cm LVIDs:         4.20 cm LV PW:         1.30 cm LV IVS:        1.10 cm LVOT diam:     2.50 cm LV SV:         78 LV SV Index:   34 LVOT Area:     4.91 cm   RIGHT VENTRICLE RV Basal diam:  2.60 cm TAPSE (M-mode): 1.0 cm  LEFT ATRIUM             Index       RIGHT ATRIUM           Index LA diam:        3.20 cm 1.38 cm/m  RA Area:     15.60 cm LA Vol (A2C):   43.6 ml 18.81  ml/m RA Volume:   40.80 ml  17.60 ml/m LA Vol (A4C):   59.6 ml 25.71 ml/m LA Biplane Vol: 54.7 ml 23.59 ml/m AORTIC VALVE LVOT Vmax:   85.97 cm/s LVOT Vmean:  56.100 cm/s LVOT VTI:    0.160 m  AORTA Ao Root diam: 3.50 cm   SHUNTS Systemic VTI:  0.16 m Systemic Diam: 2.50 cm  Donato Schultz MD Electronically signed by Donato Schultz MD Signature Date/Time: 02/05/2021/1:03:29 PM    Final    MONITORS  LONG TERM MONITOR (3-14 DAYS) 02/06/2021  Narrative Patch Wear Time:  11 days and 22 hours  100% atrial fibrillation burden rage 57-208 bpm Avg 128 bpm Rare ventricular and supraventricular ectopy  Will Camnitz, MD   CT SCANS  CT CORONARY MORPH W/CTA COR W/SCORE 11/29/2017  Addendum 11/29/2017  3:26 PM ADDENDUM REPORT: 11/29/2017 15:24  EXAM: OVER-READ INTERPRETATION  CT CHEST  The following report is an over-read performed by radiologist Dr. Nadara Eaton [facility] on 11/29/2017. This over-read does not include interpretation of cardiac or coronary anatomy or pathology. The coronary CTA interpretation by the cardiologist is attached.  COMPARISON:  Chest radiograph 11/26/2017  FINDINGS: Normal caliber of the visualized thoracic aorta without dissection. Minimal pericardial fluid. Central pulmonary arteries are patent. Incidentally, the patient has a left-sided SVC. A right-sided SVC is not identified on these images. Mildly prominent lymph node in the left paraesophageal region on sequence 11, image 27 measures 0.9 cm in the short axis and nonspecific. Overall, there is no significant lymph node enlargement.  Markedly decreased attenuation of the liver and probably represents hepatic steatosis. Tiny nodular density in the medial left lower lobe on sequence 12, image 33 is nonspecific but probably an incidental finding. Otherwise, the lungs are clear. No pleural effusions. No acute bone abnormality.  IMPRESSION: Left-sided SVC.  4 mm nodule in  the left lower lobe. No follow-up needed if patient is low-risk. Non-contrast chest CT can be considered in 12 months if patient is high-risk. This recommendation follows the consensus statement: Guidelines for Management of Incidental Pulmonary Nodules Detected on CT Images: From the Fleischner Society 2017; Radiology 2017; 284:228-243.  Hepatic steatosis.   Electronically Signed By: Madelaine Bhat  Lowella Dandy M.D. On: 11/29/2017 15:24  Narrative CLINICAL DATA:  Chest pain  EXAM: Cardiac CTA  MEDICATIONS: Sub lingual nitro. 4mg  and lopressor 10mg   TECHNIQUE: The patient was scanned on a Siemens Force 192 slice scanner. Gantry rotation speed was 250 msecs. Collimation was .6 mm. A 100 kV prospective scan was triggered in the ascending thoracic aorta at 140 HU's Full mA was used between 35% and 75% of the R-R interval. Average HR during the scan was 59 bpm. The 3D data set was interpreted on a dedicated work station using MPR, MIP and VRT modes. A total of 80 cc of contrast was used.  FINDINGS: Non-cardiac: See separate report from [facility]. No significant findings on limited lung and soft tissue windows.  Calcium Score: Marked calcification of the proximal and mid LAD and scattered calcification of RCA and circumflex  Coronary Arteries: Right dominant with no anomalies  LM: Less than 30% calcified disease in ostium  LAD: 50-75% mixed plaque but heavily calcified in proximal and mid LAD  D1: Small less than 30% mixed plaque proximally  D2: 50-75% proximal stenosis calcified  Circumflex: Less than 30% calcified stenosis in proximal and mid vessel  OM1: Small and normal  OM2: Less than 30% proximal calcific stenosis  OM3: Normal  RCA: Less than 30% calcified stenosis in the proximal mid and distal vessel  PDA: Normal  PLA: Normal  IMPRESSION: 1. Calcium score 838 which is 96 th percentile for age and sex  2. 50-75% heavily calcified stenosis in the  proximal/mid LAD and D2 study will be sent for FFR CT  3.  Normal aortic root 3.7 cm  Charlton Haws  Electronically Signed: By: Charlton Haws M.D. On: 11/29/2017 14:35           Risk Assessment/Calculations:    CHA2DS2-VASc Score = 6   This indicates a 9.7% annual risk of stroke. The patient's score is based upon: CHF History: 1 HTN History: 1 Diabetes History: 1 Stroke History: 2 Vascular Disease History: 1 Age Score: 0 Gender Score: 0            Physical Exam:   VS:  BP 126/70   Pulse (!) 58   Ht 5\' 10"  (1.778 m)   Wt 212 lb (96.2 kg)   SpO2 95%   BMI 30.42 kg/m    Wt Readings from Last 3 Encounters:  12/20/22 212 lb (96.2 kg)  06/26/21 225 lb (102.1 kg)  05/29/21 242 lb 12.8 oz (110.1 kg)    GEN: Well nourished, well developed in no acute distress NECK: No JVD; No carotid bruits CARDIAC: RRR, no murmurs, rubs, gallops RESPIRATORY:  Clear to auscultation without rales, wheezing or rhonchi  ABDOMEN: Soft, non-tender, non-distended EXTREMITIES:  +1 pitting edema in bilateral ankles; No deformity   ASSESSMENT AND PLAN: .    Chronic HFrEF with progressive cardiomyopathy of unknown etiology: Echo on 02/05/2021 indicated EF 40 to 45%, LV with mildly decreased function, LV with global hypokinesis G1 DD.  No significant valvular abnormalities. I suspect this was presumed to be due to atrial fibrillation prompting ablation but echo from [place] admission notes on 11/18/22 indicated further decline in LVEF to 35-40% despite being in NSR that visit. Otherwise this showed normal RV. Mild MR/TR Normal atrial sizes. RVSP . We will try to obtain a scanned copy of the full report. Given history of CAD, alcohol abuse and atrial fibrillation cardiomyopathy etiology unknown at this time. Patient reports he has now been sober since  07/2022. Discussed case with DOD Dr. Elease Hashimoto - with worsened EF and acute CHF exacerbation while in [place] he is agreeable to undergo right and left  cardiac catheterization to assess for possible ischemic component. If nonischemic would consider related to ETOH vs possible recurrent atrial arrhythmias. Check CBC, CMET, MAG and TSH today. Cardiac cath hold parameters for his Eliquis, glipizide, losartan and torsemide provided. Noted to have AKI during admission in July, will defer GDMT medication adjustments until after catheterization to monitor/assess kidney function.   CAD: Coronary CTA in 2019 showed nonobstructive CAD with 50 to 75% proximal to mid LAD lesion involving D2 with negative FFR. He was admitted to [facility] in 11/2022 for CHF exacerbation, EF decreased to 35-40%. Today he denies chest pain or shortness of breath. Reviewed case with Dr. Elease Hashimoto. Will plan for right and left heart catheterization for evaluation of CAD resulting in possible ischemic cardiomyopathy, he is in agreement for cardiac catheterization, see consent below. Reviewed ED precautions. Continue Eliquis, losartan, metoprolol succinate, amlodipine and torsemide. Hold off adding standing ASA pending results of cath but was instructed to take 81mg  day of cath.  Paroxysmal atrial fibrillation: S/p ablation in 04/22/21. Previously followed by afib clinic and EP, was on Multaq prior to admission in [place], where he was found to prolonged QTC. Patient reported he was taking Multaq as well as his mothers Sotalol, as he felt he was going in and out of atrial fibrillation. We discussed that he should not be taking medications not prescribed to him. Multaq is also contraindicated given his CHF. Earlier this spring his pulse ox indicated his heart rate was fluctuating between 120-140 bpm, he was not cardiac aware. EKG today shows NSR with 1st degree AV block, he denies palpitations. CHA2DS2-VASc Score = 6 [CHF History: 1, HTN History: 1, Diabetes History: 1, Stroke History: 2, Vascular Disease History: 1, Age Score: 0, Gender Score: 0].  Therefore, the patient's annual risk of  stroke is 9.7 %. Continue Eliquis, and Toprol. Urgent follow up with atrial fibrillation clinic to help re-address plan for AAD if needed.  HTN: Initial blood pressure today 126/70. Continue Losartan, metoprolol succinate, amlodipine and torsemide, will refill. He has been taking tamsulosin for non HTN reasons and requests refill - will provide 30 day rx and ask he follow-up with PCP for these.  AKI: Noted to have AKI on admission in [place] 11/18/22, creatinine 1.66 with increase to 1.95 on 11/19/22. On 11/21/22 creatinine 1.39. Check CMET today.   Hx alcohol abuse: Per patient he was drinking quarter of galloon of bourbon a day up until March 7th. Reports that he is no longer drinking.   Prolonged QT: Noted to have prolonged QT of 497 ms on admission on 11/18/22 while he was taking Multaq and Sotalol. QTC today 422 ms.   Type 2 diabetes mellitus: A1C 8.2% on 11/18/22. He requests refill of glipizide. Provided 30 day rx. Recommended establishing with PCP for ongoing monitoring and management. Consider SGLT2i post cath.  Dyslipidemia: Last lipid profile on 11/18/22 indicated totla cholesterol 123, triglyceride 43, HDL 52 and LDL of 62. Patient reports he is not currently taking his Crestor as his LDL is 62. Can further address following results of cardiac cath.    Informed Consent   Shared Decision Making/Informed Consent The risks [stroke (1 in 1000), death (1 in 1000), kidney failure [usually temporary] (1 in 500), bleeding (1 in 200), allergic reaction [possibly serious] (1 in 200)], benefits (diagnostic support and management of  coronary artery disease) and alternatives of a cardiac catheterization were discussed in detail with Mr. Teaff and he is willing to proceed.     Dispo: Follow up with any APP 2 weeks following cardiac catheterization. Urgent follow up with atrial fibrillation clinic.   Signed, Rip Harbour, NP

## 2022-12-20 ENCOUNTER — Other Ambulatory Visit (HOSPITAL_COMMUNITY): Payer: Self-pay

## 2022-12-20 ENCOUNTER — Ambulatory Visit: Payer: Self-pay | Attending: Physician Assistant | Admitting: Cardiology

## 2022-12-20 ENCOUNTER — Encounter: Payer: Self-pay | Admitting: Physician Assistant

## 2022-12-20 VITALS — BP 126/70 | HR 58 | Ht 70.0 in | Wt 212.0 lb

## 2022-12-20 DIAGNOSIS — E1165 Type 2 diabetes mellitus with hyperglycemia: Secondary | ICD-10-CM

## 2022-12-20 DIAGNOSIS — I480 Paroxysmal atrial fibrillation: Secondary | ICD-10-CM

## 2022-12-20 DIAGNOSIS — I429 Cardiomyopathy, unspecified: Secondary | ICD-10-CM

## 2022-12-20 DIAGNOSIS — I2510 Atherosclerotic heart disease of native coronary artery without angina pectoris: Secondary | ICD-10-CM

## 2022-12-20 DIAGNOSIS — I10 Essential (primary) hypertension: Secondary | ICD-10-CM

## 2022-12-20 DIAGNOSIS — Z7984 Long term (current) use of oral hypoglycemic drugs: Secondary | ICD-10-CM

## 2022-12-20 DIAGNOSIS — E785 Hyperlipidemia, unspecified: Secondary | ICD-10-CM

## 2022-12-20 DIAGNOSIS — I5020 Unspecified systolic (congestive) heart failure: Secondary | ICD-10-CM

## 2022-12-20 DIAGNOSIS — Z01812 Encounter for preprocedural laboratory examination: Secondary | ICD-10-CM

## 2022-12-20 DIAGNOSIS — F1010 Alcohol abuse, uncomplicated: Secondary | ICD-10-CM

## 2022-12-20 LAB — BASIC METABOLIC PANEL
BUN/Creatinine Ratio: 23 — ABNORMAL HIGH (ref 9–20)
BUN: 24 mg/dL (ref 6–24)
CO2: 28 mmol/L (ref 20–29)
Calcium: 10.2 mg/dL (ref 8.7–10.2)
Chloride: 100 mmol/L (ref 96–106)
Creatinine, Ser: 1.06 mg/dL (ref 0.76–1.27)
Glucose: 171 mg/dL — ABNORMAL HIGH (ref 70–99)
Potassium: 4.3 mmol/L (ref 3.5–5.2)
Sodium: 140 mmol/L (ref 134–144)
eGFR: 81 mL/min/{1.73_m2} (ref >59)

## 2022-12-20 LAB — CBC

## 2022-12-20 LAB — MAGNESIUM: Magnesium: 1.9 mg/dL (ref 1.6–2.3)

## 2022-12-20 LAB — TSH: TSH: 3.72 u[IU]/mL (ref 0.450–4.500)

## 2022-12-20 MED ORDER — APIXABAN 5 MG PO TABS
5.0000 mg | ORAL_TABLET | Freq: Two times a day (BID) | ORAL | 11 refills | Status: DC
  Filled 2022-12-20: qty 60, 30d supply, fill #0

## 2022-12-20 MED ORDER — LOSARTAN POTASSIUM 50 MG PO TABS
50.0000 mg | ORAL_TABLET | Freq: Every day | ORAL | 11 refills | Status: DC
  Filled 2022-12-20 (×2): qty 30, 30d supply, fill #0
  Filled 2023-01-14: qty 30, 30d supply, fill #1
  Filled 2023-02-19: qty 30, 30d supply, fill #2
  Filled 2023-03-18: qty 30, 30d supply, fill #3
  Filled 2023-04-15: qty 30, 30d supply, fill #4
  Filled 2023-05-18: qty 30, 30d supply, fill #5
  Filled 2023-06-15: qty 90, 90d supply, fill #6
  Filled 2023-09-11: qty 90, 90d supply, fill #7

## 2022-12-20 MED ORDER — GLIPIZIDE 5 MG PO TABS
5.0000 mg | ORAL_TABLET | Freq: Two times a day (BID) | ORAL | 0 refills | Status: DC
  Filled 2022-12-20 (×2): qty 30, 15d supply, fill #0

## 2022-12-20 MED ORDER — AMLODIPINE BESYLATE 2.5 MG PO TABS
2.5000 mg | ORAL_TABLET | Freq: Every day | ORAL | 11 refills | Status: DC
  Filled 2022-12-20 (×2): qty 30, 30d supply, fill #0

## 2022-12-20 MED ORDER — TORSEMIDE 20 MG PO TABS
40.0000 mg | ORAL_TABLET | Freq: Every day | ORAL | 11 refills | Status: DC
  Filled 2022-12-20: qty 60, 30d supply, fill #0

## 2022-12-20 MED ORDER — SILDENAFIL CITRATE 100 MG PO TABS
100.0000 mg | ORAL_TABLET | Freq: Every day | ORAL | 0 refills | Status: DC
  Filled 2022-12-20: qty 30, 30d supply, fill #0
  Filled 2022-12-20: qty 30, fill #0

## 2022-12-20 MED ORDER — TAMSULOSIN HCL 0.4 MG PO CAPS
0.4000 mg | ORAL_CAPSULE | Freq: Every day | ORAL | 0 refills | Status: DC
  Filled 2022-12-20 (×2): qty 30, 30d supply, fill #0

## 2022-12-20 MED ORDER — METOPROLOL SUCCINATE ER 50 MG PO TB24
50.0000 mg | ORAL_TABLET | Freq: Two times a day (BID) | ORAL | 11 refills | Status: DC
  Filled 2022-12-20 (×2): qty 60, 30d supply, fill #0
  Filled 2023-01-14: qty 60, 30d supply, fill #1
  Filled 2023-02-19: qty 60, 30d supply, fill #2
  Filled 2023-03-19: qty 60, 30d supply, fill #3
  Filled 2023-05-03: qty 60, 30d supply, fill #4
  Filled 2023-05-29: qty 60, 30d supply, fill #5
  Filled 2023-07-02: qty 60, 30d supply, fill #6
  Filled 2023-07-31: qty 60, 30d supply, fill #7
  Filled 2023-09-03: qty 60, 30d supply, fill #8
  Filled 2023-10-01: qty 60, 30d supply, fill #9
  Filled 2023-10-31: qty 60, 30d supply, fill #10
  Filled 2023-12-02: qty 60, 30d supply, fill #11

## 2022-12-20 NOTE — Patient Instructions (Signed)
Medication Instructions:  Your physician recommends that you continue on your current medications as directed. Please refer to the Current Medication list given to you today.  *If you need a refill on your cardiac medications before your next appointment, please call your pharmacy*   Lab Work: Cmp, Tsh, Cbc, Magnesium - Today   If you have labs (blood work) drawn today and your tests are completely normal, you will receive your results only by: MyChart Message (if you have MyChart) OR A paper copy in the mail If you have any lab test that is abnormal or we need to change your treatment, we will call you to review the results.   Testing/Procedures: Your physician has requested that you have a cardiac catheterization. Cardiac catheterization is used to diagnose and/or treat various heart conditions. Doctors may recommend this procedure for a number of different reasons. The most common reason is to evaluate chest pain. Chest pain can be a symptom of coronary artery disease (CAD), and cardiac catheterization can show whether plaque is narrowing or blocking your heart's arteries. This procedure is also used to evaluate the valves, as well as measure the blood flow and oxygen levels in different parts of your heart. For further information please visit [email or web address] Please follow instruction sheet, as given.    Follow-Up: At [facility], you and your health needs are our priority.  As part of our continuing mission to provide you with exceptional heart care, we have created designated Provider Care Teams.  These Care Teams include your primary Cardiologist (physician) and Advanced Practice Providers (APPs -  Physician Assistants and Nurse Practitioners) who all work together to provide you with the care you need, when you need it.  We recommend signing up for the patient portal called "MyChart".  Sign up information is provided on this After Visit Summary.  MyChart is used to  connect with patients for Virtual Visits (Telemedicine).  Patients are able to view lab/test results, encounter notes, upcoming appointments, etc.  Non-urgent messages can be sent to your provider as well.   To learn more about what you can do with MyChart, go to [email or web address].    Your next appointment:   2 -3week(s)  Follow up with the A-Fib Clinic ASAP  Provider:   Dietrich Pates, MD or APP   Other Instructions  [facility] A DEPT OF [place]. [facility] AT [facility] [address] [place], [place] [address] [place] [address] Dept: [phone] Loc: [phone]  Kyle Dawson  12/20/2022  You are scheduled for a Cardiac Catheterization on Thursday, August 15 with Dr. Alverda Skeans.  1. Please arrive at the [facility] (Main Entrance A) at [facility]: [address] [place], [place] [address] at 8:30 AM (This time is 2 hour(s) before your procedure to ensure your preparation). Free valet parking service is available. You will check in at ADMITTING. The support person will be asked to wait in the waiting room.  It is OK to have someone drop you off and come back when you are ready to be discharged.    Special note: Every effort is made to have your procedure done on time. Please understand that emergencies sometimes delay scheduled procedures.  2. Diet: Do not eat solid foods after midnight.  The patient may have clear liquids until 5am upon the day of the procedure.  3. Medication instructions in preparation for your procedure:  Stop taking Eliquis (Apixiban) on Tuesday, August 13.  Stop taking, Cozaar (Losartan) Wednesday, August 14,  Hold Glipizide the day of your procedure  Hold Torsemide the day of your procedure   On the morning of your procedure, take your Aspirin 81 mg and any morning medicines NOT listed above.  You may use sips of water.  5. Plan to go home the same day, you will only stay overnight if  medically necessary. 6. Bring a current list of your medications and current insurance cards. 7. You MUST have a responsible person to drive you home. 8. Someone MUST be with you the first 24 hours after you arrive home or your discharge will be delayed. 9. Please wear clothes that are easy to get on and off and wear slip-on shoes.  Thank you for allowing [place] to care for you!   -- [facility]

## 2022-12-21 ENCOUNTER — Ambulatory Visit (INDEPENDENT_AMBULATORY_CARE_PROVIDER_SITE_OTHER): Payer: Self-pay | Admitting: Endocrinology

## 2022-12-21 ENCOUNTER — Encounter: Payer: Self-pay | Admitting: Endocrinology

## 2022-12-21 ENCOUNTER — Other Ambulatory Visit (HOSPITAL_COMMUNITY): Payer: Self-pay

## 2022-12-21 VITALS — BP 118/70 | HR 71 | Ht 70.0 in | Wt 211.8 lb

## 2022-12-21 DIAGNOSIS — E1165 Type 2 diabetes mellitus with hyperglycemia: Secondary | ICD-10-CM

## 2022-12-21 LAB — POCT GLYCOSYLATED HEMOGLOBIN (HGB A1C): Hemoglobin A1C: 7.3 % — AB (ref 4.0–5.6)

## 2022-12-21 MED ORDER — GLIPIZIDE 5 MG PO TABS
5.0000 mg | ORAL_TABLET | Freq: Two times a day (BID) | ORAL | 3 refills | Status: DC
Start: 2022-12-21 — End: 2023-07-15
  Filled 2022-12-21 – 2022-12-25 (×2): qty 90, 45d supply, fill #0
  Filled 2023-01-14: qty 90, 45d supply, fill #1
  Filled 2023-04-15: qty 90, 45d supply, fill #2
  Filled 2023-05-29: qty 90, 45d supply, fill #3

## 2022-12-21 NOTE — Progress Notes (Unsigned)
Outpatient Endocrinology Note [place] , MD  12/22/22  Patient's Name: Kyle Dawson    DOB: 07/31/1965    MRN: [id number]                                                    REASON OF VISIT: Type 2 diabetes mellitus  PCP: Pcp, No  HISTORY OF PRESENT ILLNESS:   Kyle Dawson is a 57 y.o. old male with past medical history listed below, is here for type 2 diabetes mellitus.  Patient was last time seen by Dr. Lucianne Muss in June 2020.  Pertinent Diabetes History: Patient was diagnosed with type 2 diabetes mellitus around 2005.  He lost follow-up with endocrinology, he reports she had no medical insurance and not able to keep up with medical care.  He was not taking any diabetic medication for more than a year.  He was recently in July hospitalized in [place] due to CHF and hemoglobin at that time was 8.3%, he was treated with insulin therapy while hospitalized and discharged on glipizide.  He had fair control of diabetes mellitus in the past with hemoglobin A1c in the range of 6 to 9% mostly.  Chronic Diabetes Complications : Retinopathy: no. Last ophthalmology exam was done on due Nephropathy: no Peripheral neuropathy: no Coronary artery disease: no, has CHF Stroke: no  Relevant comorbidities and cardiovascular risk factors: Obesity: yes Body mass index is 30.39 kg/m.  Hypertension: yes Hyperlipidemia. yes  Current / Home Diabetic regimen includes: Glipizide 5 mg 2 times a day with meals.  Prior diabetic medications: Januvia, metformin extended release, Jardiance.  Glycemic data:   No glucometer data available to review.  He has not been checking blood sugar at home lately.  He has glucometer and test supplies at home.  Factors modifying glucose control: 1.  Diabetic diet assessment: He started to eat better with mainly vegetables and meat/cheese.  Decreasing carbohydrate contents.  2.  Staying active or exercising: No formal exercise.  3.  Medication compliance:  compliant most of the time.  Interval history 12/22/22 He is currently taking glipizide 5 mg 2 times a day, I started treating July.  Prior to that he was not taking any diabetic medication for more than a year.  Hemoglobin A1c was 8.2% in July in outside hospital in [place].  His hemoglobin A1c today 7.3%.  He has also been controlling his diet.  He reports he has plan to have heart cath next week.  REVIEW OF SYSTEMS As per history of present illness.   PAST MEDICAL HISTORY: Past Medical History:  Diagnosis Date   Basal cell carcinoma    Chest pain    Diabetes mellitus without complication (HCC)    Edema    lower extremity   Hypertension    Squamous cell carcinoma    R ear, nose, each side of face    PAST SURGICAL HISTORY: Past Surgical History:  Procedure Laterality Date   ATRIAL FIBRILLATION ABLATION N/A 04/22/2021   Procedure: ATRIAL FIBRILLATION ABLATION;  Surgeon: Regan Lemming, MD;  Location: [facility];  Service: Cardiovascular;  Laterality: N/A;   Removal basal cell carcinoma     Removal squamous cell carcinoma      ALLERGIES: Allergies  Allergen Reactions   Oxycodone Anxiety and Other (See Comments)    "I drank it  with beer and it made me hyper"    FAMILY HISTORY:  Family History  Problem Relation Age of Onset   Peripheral vascular disease Mother 56   Hypertension Mother    Heart failure Mother    CAD Maternal Grandfather    Heart failure Paternal Grandfather    Hypertension Father     SOCIAL HISTORY: Social History   Socioeconomic History   Marital status: Significant Other    Spouse name: Not on file   Number of children: 0   Years of education: ADN   Highest education level: Not on file  Occupational History   Occupation: ER [employer]    Employer: [employer]   Occupation: [employer]  Tobacco Use   Smoking status: Never   Smokeless tobacco: Never  Vaping Use   Vaping status: Never Used  Substance and Sexual Activity   Alcohol use: Yes     Alcohol/week: 3.0 - 6.0 standard drinks of alcohol    Types: 3 - 6 Shots of liquor per week    Comment: 1-2 shots every 2-3 days 06/26/2021   Drug use: No   Sexual activity: Not on file  Other Topics Concern   Not on file  Social History Narrative   Patient drinks 1 cup of coffee a day    Social Determinants of Health   Financial Resource Strain: Not on file  Food Insecurity: Not on file  Transportation Needs: Not on file  Physical Activity: Not on file  Stress: Not on file  Social Connections: Not on file    MEDICATIONS:  Current Outpatient Medications  Medication Sig Dispense Refill   amLODipine (NORVASC) 2.5 MG tablet Take 1 tablet (2.5 mg total) by mouth daily. 30 tablet 11   apixaban (ELIQUIS) 5 MG TABS tablet Take 1 tablet (5 mg) by mouth 2 times daily. 60 tablet 11   losartan (COZAAR) 50 MG tablet Take 1 tablet (50 mg total) by mouth daily. 30 tablet 11   metoprolol succinate (TOPROL-XL) 50 MG 24 hr tablet Take 1 tablet (50 mg total) by mouth in the morning and at bedtime. 60 tablet 11   OneTouch Delica Lancets 33G MISC USE TO CHECK BLOOD SUGAR ONCE DAILY 100 each 1   sildenafil (VIAGRA) 100 MG tablet Take 1 tablet (100 mg total) by mouth daily as needed for erectile dysfunction 30 tablet 0   tamsulosin (FLOMAX) 0.4 MG CAPS capsule Take 1 capsule (0.4 mg total) by mouth daily after supper. 30 capsule 0   torsemide (DEMADEX) 20 MG tablet Take 2 tablets (40 mg) by mouth daily. 60 tablet 11   glipiZIDE (GLUCOTROL) 5 MG tablet Take 1 tablet by mouth 2  times daily before a meal. 90 tablet 3   Multiple Vitamins-Minerals (MULTIVITAMIN WITH MINERALS) tablet Take 1 tablet by mouth daily.     No current facility-administered medications for this visit.    PHYSICAL EXAM: Vitals:   12/21/22 0957  BP: 118/70  Pulse: 71  SpO2: 98%  Weight: 211 lb 12.8 oz (96.1 kg)  Height: 5\' 10"  (1.778 m)   Body mass index is 30.39 kg/m.  Wt Readings from Last 3 Encounters:  12/21/22  211 lb 12.8 oz (96.1 kg)  12/20/22 212 lb (96.2 kg)  06/26/21 225 lb (102.1 kg)    General: Well developed, well nourished male in no apparent distress.  HEENT: AT/, no external lesions.  Eyes: Conjunctiva clear and no icterus. Neck: Neck supple  Lungs: Respirations not labored Neurologic: Alert, oriented, normal speech  Extremities / Skin: Dry. No sores or rashes noted. No acanthosis nigricans Psychiatric: Does not appear depressed or anxious  Diabetic Foot Exam - Simple   No data filed    LABS Reviewed Lab Results  Component Value Date   HGBA1C 7.3 (A) 12/21/2022   HGBA1C 6.4 (H) 05/16/2020   HGBA1C 6.5 (H) 10/15/2019   Lab Results  Component Value Date   FRUCTOSAMINE 401 (H) 10/25/2018   FRUCTOSAMINE 283 03/12/2016   Lab Results  Component Value Date   CHOL 190 04/16/2021   HDL 69 04/16/2021   LDLCALC 103 (H) 04/16/2021   TRIG 100 04/16/2021   CHOLHDL 2.8 04/16/2021   Lab Results  Component Value Date   MICRALBCREAT 1 05/16/2020   MICRALBCREAT 1.2 02/23/2017   Lab Results  Component Value Date   CREATININE 1.06 12/20/2022   Lab Results  Component Value Date   GFR 98.65 02/23/2017    ASSESSMENT / PLAN  1. Uncontrolled type 2 diabetes mellitus with hyperglycemia, without long-term current use of insulin (HCC)     Diabetes Mellitus type 2, complicated by no known complications. - Diabetic status / severity: Uncontrolled.  Lab Results  Component Value Date   HGBA1C 7.3 (A) 12/21/2022    - Hemoglobin A1c goal : <7%  Discussed about diabetes mellitus and importance of controlling blood sugar and compliance with medication.  He has limitation with medications due to no insurance at this time.  Reports he has a new job provide we have medical insurance in coming days.  Hemoglobin A1c today 7.3% improved from 8.2 from outside hospital.  Discussed about staying on diet plan and exercise as tolerated.  - Medications: No change  I) continue glipizide  5 mg 2 times a day.  - Home glucose testing: Check at least in the morning fasting and bedtime daily. - Discussed/ Gave Hypoglycemia treatment plan.  # Consult : not required at this time.   # Annual urine for microalbuminuria/ creatinine ratio, no microalbuminuria currently, continue ACE/ARB on losartan. Last  Lab Results  Component Value Date   MICRALBCREAT 1 05/16/2020    # Foot check nightly / neuropathy.  # Annual dilated diabetic eye exams.  Advised to have diabetic eye exam.  - Diet: Make healthy diabetic food choices - Life style / activity / exercise: Discussed.  2. Blood pressure  -  BP Readings from Last 1 Encounters:  12/21/22 118/70    - Control is in target.  - No change in current plans.  3. Lipid status / Hyperlipidemia - Last  Lab Results  Component Value Date   LDLCALC 103 (H) 04/16/2021   -need statin therapy.  Following with cardiology and PCP at this time.  Diagnoses and all orders for this visit:  Uncontrolled type 2 diabetes mellitus with hyperglycemia, without long-term current use of insulin (HCC) -     POCT glycosylated hemoglobin (Hb A1C) -     glipiZIDE (GLUCOTROL) 5 MG tablet; Take 1 tablet by mouth 2  times daily before a meal.    DISPOSITION Follow up in clinic in 3  months suggested.   All questions answered and patient verbalized understanding of the plan.  [place] , MD [facility] Endocrinology [facility] [address] [place], [address] [place], [place] [address] Phone # [phone]  At least part of this note was generated using voice recognition software. Inadvertent word errors may have occurred, which were not recognized during the proofreading process.

## 2022-12-21 NOTE — Patient Instructions (Signed)
Continue glipizide 5mg  two times a day with meals.  Check glucose in the morning fasting and at bedtime.  Once you have medical insurance, let me know we can plan for CGM and other diabetic medications.

## 2022-12-22 ENCOUNTER — Other Ambulatory Visit: Payer: Self-pay

## 2022-12-22 ENCOUNTER — Telehealth: Payer: Self-pay | Admitting: *Deleted

## 2022-12-22 ENCOUNTER — Encounter: Payer: Self-pay | Admitting: Endocrinology

## 2022-12-22 ENCOUNTER — Telehealth: Payer: Self-pay

## 2022-12-22 NOTE — Telephone Encounter (Signed)
Pt call put through to me at 4:51 pm and the pt reports that he has top be at the hops tomorrow morning at 8:30 am for his R and L heart cath... he says he does not have a ride home even if someone drops him off and comes back later... I advised  him that I will message the appropriate staff and alert tham to this issue since he cannot drive home, under any circumstance and for now he will still plan to arrive at 8:30 unless he is told other wise to cancel. Pt says he does no have  another day at this point that he can reschedule to that is more convenient for him to get a ride.

## 2022-12-22 NOTE — Telephone Encounter (Addendum)
Cardiac Catheterization scheduled at [facility] for: Thursday December 23, 2022 10:30 AM Arrival time [facility] Main Entrance A at: 8:30 AM  Nothing to eat after midnight prior to procedure, clear liquids until 5 AM day of procedure.  Medication instructions: -Hold:  Eliquis-none 12/21/22 until post procedure  Glipizide-AM of procedure  Torsemide-AM of procedure -Other usual morning medications can be taken with sips of water including aspirin 81 mg.  Plan to go home the same day, you will only stay overnight if medically necessary.  You must have responsible adult to drive you home.  Someone must be with you the first 24 hours after you arrive home.  Reviewed procedure instructions with patient.

## 2022-12-23 ENCOUNTER — Other Ambulatory Visit: Payer: Self-pay

## 2022-12-23 ENCOUNTER — Observation Stay (HOSPITAL_COMMUNITY)
Admission: RE | Admit: 2022-12-23 | Discharge: 2022-12-24 | Disposition: A | Discharge status: Home / Self Care | Payer: Self-pay | Attending: Internal Medicine | Admitting: Internal Medicine

## 2022-12-23 ENCOUNTER — Ambulatory Visit (HOSPITAL_COMMUNITY)
Admission: RE | Disposition: A | Discharge status: Home / Self Care | Payer: Self-pay | Source: Home / Self Care | Attending: Internal Medicine

## 2022-12-23 DIAGNOSIS — Z8673 Personal history of transient ischemic attack (TIA), and cerebral infarction without residual deficits: Secondary | ICD-10-CM | POA: Insufficient documentation

## 2022-12-23 DIAGNOSIS — Q261 Persistent left superior vena cava: Secondary | ICD-10-CM

## 2022-12-23 DIAGNOSIS — F1021 Alcohol dependence, in remission: Secondary | ICD-10-CM

## 2022-12-23 DIAGNOSIS — N179 Acute kidney failure, unspecified: Secondary | ICD-10-CM | POA: Insufficient documentation

## 2022-12-23 DIAGNOSIS — I2510 Atherosclerotic heart disease of native coronary artery without angina pectoris: Secondary | ICD-10-CM | POA: Insufficient documentation

## 2022-12-23 DIAGNOSIS — E1122 Type 2 diabetes mellitus with diabetic chronic kidney disease: Secondary | ICD-10-CM | POA: Insufficient documentation

## 2022-12-23 DIAGNOSIS — D6869 Other thrombophilia: Secondary | ICD-10-CM | POA: Diagnosis present

## 2022-12-23 DIAGNOSIS — I5020 Unspecified systolic (congestive) heart failure: Secondary | ICD-10-CM

## 2022-12-23 DIAGNOSIS — I428 Other cardiomyopathies: Secondary | ICD-10-CM

## 2022-12-23 DIAGNOSIS — I5023 Acute on chronic systolic (congestive) heart failure: Principal | ICD-10-CM | POA: Insufficient documentation

## 2022-12-23 DIAGNOSIS — I4819 Other persistent atrial fibrillation: Secondary | ICD-10-CM | POA: Insufficient documentation

## 2022-12-23 DIAGNOSIS — E119 Type 2 diabetes mellitus without complications: Secondary | ICD-10-CM

## 2022-12-23 DIAGNOSIS — I130 Hypertensive heart and chronic kidney disease with heart failure and stage 1 through stage 4 chronic kidney disease, or unspecified chronic kidney disease: Secondary | ICD-10-CM | POA: Insufficient documentation

## 2022-12-23 DIAGNOSIS — I480 Paroxysmal atrial fibrillation: Secondary | ICD-10-CM | POA: Diagnosis present

## 2022-12-23 DIAGNOSIS — N182 Chronic kidney disease, stage 2 (mild): Secondary | ICD-10-CM | POA: Insufficient documentation

## 2022-12-23 DIAGNOSIS — I119 Hypertensive heart disease without heart failure: Secondary | ICD-10-CM | POA: Diagnosis present

## 2022-12-23 HISTORY — PX: RIGHT/LEFT HEART CATH AND CORONARY ANGIOGRAPHY: CATH118266

## 2022-12-23 LAB — POCT I-STAT 7, (LYTES, BLD GAS, ICA,H+H)
Acid-Base Excess: 2 mmol/L (ref 0.0–2.0)
Bicarbonate: 26.9 mmol/L (ref 20.0–28.0)
Calcium, Ion: 1.23 mmol/L (ref 1.15–1.40)
HCT: 37 % — ABNORMAL LOW (ref 39.0–52.0)
Hemoglobin: 12.6 g/dL — ABNORMAL LOW (ref 13.0–17.0)
O2 Saturation: 97 %
Potassium: 3.6 mmol/L (ref 3.5–5.1)
Sodium: 138 mmol/L (ref 135–145)
TCO2: 28 mmol/L (ref 22–32)
pCO2 arterial: 42.7 mmHg (ref 32–48)
pH, Arterial: 7.408 (ref 7.35–7.45)
pO2, Arterial: 88 mmHg (ref 83–108)

## 2022-12-23 LAB — GLUCOSE, CAPILLARY: Glucose-Capillary: 161 mg/dL — ABNORMAL HIGH (ref 70–99)

## 2022-12-23 LAB — POCT I-STAT EG7
Acid-base deficit: 4 mmol/L — ABNORMAL HIGH (ref 0.0–2.0)
Acid-base deficit: 4 mmol/L — ABNORMAL HIGH (ref 0.0–2.0)
Bicarbonate: 22.7 mmol/L (ref 20.0–28.0)
Bicarbonate: 23 mmol/L (ref 20.0–28.0)
Calcium, Ion: 1.11 mmol/L — ABNORMAL LOW (ref 1.15–1.40)
Calcium, Ion: 1.17 mmol/L (ref 1.15–1.40)
HCT: 36 % — ABNORMAL LOW (ref 39.0–52.0)
HCT: 38 % — ABNORMAL LOW (ref 39.0–52.0)
Hemoglobin: 12.2 g/dL — ABNORMAL LOW (ref 13.0–17.0)
Hemoglobin: 12.9 g/dL — ABNORMAL LOW (ref 13.0–17.0)
O2 Saturation: 73 %
O2 Saturation: 75 %
Potassium: 3.1 mmol/L — ABNORMAL LOW (ref 3.5–5.1)
Potassium: 3.4 mmol/L — ABNORMAL LOW (ref 3.5–5.1)
Sodium: 123 mmol/L — ABNORMAL LOW (ref 135–145)
Sodium: 140 mmol/L (ref 135–145)
TCO2: 24 mmol/L (ref 22–32)
TCO2: 24 mmol/L (ref 22–32)
pCO2, Ven: 49.5 mmHg (ref 44–60)
pCO2, Ven: 50.5 mmHg (ref 44–60)
pH, Ven: 7.266 (ref 7.25–7.43)
pH, Ven: 7.27 (ref 7.25–7.43)
pO2, Ven: 44 mmHg (ref 32–45)
pO2, Ven: 47 mmHg — ABNORMAL HIGH (ref 32–45)

## 2022-12-23 SURGERY — RIGHT/LEFT HEART CATH AND CORONARY ANGIOGRAPHY
Anesthesia: LOCAL

## 2022-12-23 MED ORDER — HEPARIN SODIUM (PORCINE) 1000 UNIT/ML IJ SOLN
INTRAMUSCULAR | Status: AC
  Filled 2022-12-23: qty 10

## 2022-12-23 MED ORDER — HEPARIN SODIUM (PORCINE) 1000 UNIT/ML IJ SOLN
INTRAMUSCULAR | Status: DC | PRN
  Administered 2022-12-23: 5000 [IU] via INTRAVENOUS

## 2022-12-23 MED ORDER — MIDAZOLAM HCL 2 MG/2ML IJ SOLN
INTRAMUSCULAR | Status: AC
  Filled 2022-12-23: qty 2

## 2022-12-23 MED ORDER — ONDANSETRON HCL 4 MG/2ML IJ SOLN: 4.0000 mg | Freq: Four times a day (QID) | INTRAMUSCULAR | Status: DC | PRN

## 2022-12-23 MED ORDER — SODIUM CHLORIDE 0.9 % IV SOLN: 250.0000 mL | INTRAVENOUS | Status: DC | PRN

## 2022-12-23 MED ORDER — VERAPAMIL HCL 2.5 MG/ML IV SOLN
INTRAVENOUS | Status: AC
  Filled 2022-12-23: qty 2

## 2022-12-23 MED ORDER — LABETALOL HCL 5 MG/ML IV SOLN: 10.0000 mg | INTRAVENOUS | Status: AC | PRN

## 2022-12-23 MED ORDER — LIDOCAINE HCL (PF) 1 % IJ SOLN
INTRAMUSCULAR | Status: AC
  Filled 2022-12-23: qty 30

## 2022-12-23 MED ORDER — LIDOCAINE HCL (PF) 1 % IJ SOLN
INTRAMUSCULAR | Status: DC | PRN
  Administered 2022-12-23 (×2): 2 mL

## 2022-12-23 MED ORDER — FENTANYL CITRATE (PF) 100 MCG/2ML IJ SOLN
INTRAMUSCULAR | Status: DC | PRN
  Administered 2022-12-23: 25 ug via INTRAVENOUS

## 2022-12-23 MED ORDER — VERAPAMIL HCL 2.5 MG/ML IV SOLN
INTRAVENOUS | Status: DC | PRN
  Administered 2022-12-23: 10 mL via INTRA_ARTERIAL

## 2022-12-23 MED ORDER — HEPARIN (PORCINE) IN NACL 1000-0.9 UT/500ML-% IV SOLN
INTRAVENOUS | Status: DC | PRN
  Administered 2022-12-23 (×2): 500 mL

## 2022-12-23 MED ORDER — SODIUM CHLORIDE 0.9% FLUSH
3.0000 mL | Freq: Two times a day (BID) | INTRAVENOUS | Status: DC
  Administered 2022-12-23 – 2022-12-24 (×2): 3 mL via INTRAVENOUS

## 2022-12-23 MED ORDER — FUROSEMIDE 10 MG/ML IJ SOLN
60.0000 mg | Freq: Two times a day (BID) | INTRAMUSCULAR | Status: DC
  Administered 2022-12-23: 60 mg via INTRAVENOUS
  Filled 2022-12-23: qty 6

## 2022-12-23 MED ORDER — HYDRALAZINE HCL 20 MG/ML IJ SOLN: 10.0000 mg | INTRAMUSCULAR | Status: AC | PRN

## 2022-12-23 MED ORDER — ACETAMINOPHEN 325 MG PO TABS: 650.0000 mg | ORAL_TABLET | ORAL | Status: DC | PRN

## 2022-12-23 MED ORDER — ASPIRIN 81 MG PO CHEW: 81.0000 mg | CHEWABLE_TABLET | ORAL | Status: DC

## 2022-12-23 MED ORDER — SODIUM CHLORIDE 0.9% FLUSH: 3.0000 mL | INTRAVENOUS | Status: DC | PRN

## 2022-12-23 MED ORDER — SODIUM CHLORIDE 0.9 % IV SOLN: INTRAVENOUS | Status: DC

## 2022-12-23 MED ORDER — FUROSEMIDE 10 MG/ML IJ SOLN
INTRAMUSCULAR | Status: DC | PRN
  Administered 2022-12-23: 40 mg via INTRAVENOUS

## 2022-12-23 MED ORDER — MIDAZOLAM HCL 2 MG/2ML IJ SOLN
INTRAMUSCULAR | Status: DC | PRN
  Administered 2022-12-23: 1 mg via INTRAVENOUS

## 2022-12-23 MED ORDER — IOHEXOL 350 MG/ML SOLN
INTRAVENOUS | Status: DC | PRN
  Administered 2022-12-23: 50 mL

## 2022-12-23 MED ORDER — FENTANYL CITRATE (PF) 100 MCG/2ML IJ SOLN
INTRAMUSCULAR | Status: AC
  Filled 2022-12-23: qty 2

## 2022-12-23 MED ORDER — FUROSEMIDE 10 MG/ML IJ SOLN
INTRAMUSCULAR | Status: AC
  Filled 2022-12-23: qty 4

## 2022-12-23 SURGICAL SUPPLY — 12 items
CARD KEY FFR CATHWORX (MISCELLANEOUS) IMPLANT
CATH BALLN WEDGE 5F 110CM (CATHETERS) IMPLANT
CATH INFINITI 5FR ANG PIGTAIL (CATHETERS) IMPLANT
CATH INFINITI AMBI 6FR TG (CATHETERS) IMPLANT
DEVICE RAD COMP TR BAND LRG (VASCULAR PRODUCTS) IMPLANT
FFR CATHWORX KEY CARD (MISCELLANEOUS) ×1
GLIDESHEATH SLEND SS 6F .021 (SHEATH) IMPLANT
GUIDEWIRE .025 260CM (WIRE) IMPLANT
PACK CARDIAC CATHETERIZATION (CUSTOM PROCEDURE TRAY) ×1 IMPLANT
SET ATX-X65L (MISCELLANEOUS) IMPLANT
SHEATH GLIDE SLENDER 4/5FR (SHEATH) IMPLANT
WIRE EMERALD 3MM-J .035X260CM (WIRE) IMPLANT

## 2022-12-23 NOTE — Interval H&P Note (Signed)
History and Physical Interval Note:  12/23/2022 8:50 AM  Kyle Dawson  has presented today for surgery, with the diagnosis of heart failure - cardiomyopathy.  The various methods of treatment have been discussed with the patient and family. After consideration of risks, benefits and other options for treatment, the patient has consented to  Procedure(s): RIGHT/LEFT HEART CATH AND CORONARY ANGIOGRAPHY (N/A) as a surgical intervention.  The patient's history has been reviewed, patient examined, no change in status, stable for surgery.  I have reviewed the patient's chart and labs.  Questions were answered to the patient's satisfaction.     [name]

## 2022-12-23 NOTE — Telephone Encounter (Signed)
I received message late yesterday that patient would not have anyone to be with him if same day discharge. Call placed to patient to discuss situation-pt reports he does not have anyone to be with him if same day discharge. Patient reports his plan is to drive himself to the hospital, stay overnight and drive himself home the following day. I have notified cath lab of his plan.

## 2022-12-23 NOTE — Plan of Care (Signed)
  Problem: Activity: Goal: Ability to return to baseline activity level will improve Outcome: Progressing   Problem: Health Behavior/Discharge Planning: Goal: Ability to manage health-related needs will improve Outcome: Progressing   Problem: Clinical Measurements: Goal: Ability to maintain clinical measurements within normal limits will improve Outcome: Progressing   Problem: Activity: Goal: Risk for activity intolerance will decrease Outcome: Progressing   Problem: Nutrition: Goal: Adequate nutrition will be maintained Outcome: Progressing   Problem: Coping: Goal: Level of anxiety will decrease Outcome: Progressing   Problem: Pain Managment: Goal: General experience of comfort will improve Outcome: Progressing   Problem: Safety: Goal: Ability to remain free from injury will improve Outcome: Progressing   Problem: Skin Integrity: Goal: Risk for impaired skin integrity will decrease Outcome: Progressing

## 2022-12-23 NOTE — Plan of Care (Signed)

## 2022-12-23 NOTE — Discharge Instructions (Signed)
Increase torsemide to 40mg  BID, BMP in 5 days Restart Eliquis tonight

## 2022-12-24 ENCOUNTER — Encounter (HOSPITAL_COMMUNITY): Payer: Self-pay

## 2022-12-24 ENCOUNTER — Other Ambulatory Visit (HOSPITAL_COMMUNITY): Payer: Self-pay

## 2022-12-24 ENCOUNTER — Encounter (HOSPITAL_COMMUNITY): Payer: Self-pay | Admitting: Internal Medicine

## 2022-12-24 DIAGNOSIS — I480 Paroxysmal atrial fibrillation: Secondary | ICD-10-CM

## 2022-12-24 DIAGNOSIS — D6869 Other thrombophilia: Secondary | ICD-10-CM

## 2022-12-24 DIAGNOSIS — I428 Other cardiomyopathies: Secondary | ICD-10-CM

## 2022-12-24 DIAGNOSIS — I119 Hypertensive heart disease without heart failure: Secondary | ICD-10-CM

## 2022-12-24 DIAGNOSIS — I5023 Acute on chronic systolic (congestive) heart failure: Secondary | ICD-10-CM | POA: Insufficient documentation

## 2022-12-24 LAB — BASIC METABOLIC PANEL
Anion gap: 13 (ref 5–15)
BUN: 22 mg/dL — ABNORMAL HIGH (ref 6–20)
CO2: 28 mmol/L (ref 22–32)
Calcium: 9.4 mg/dL (ref 8.9–10.3)
Chloride: 93 mmol/L — ABNORMAL LOW (ref 98–111)
Creatinine, Ser: 1.18 mg/dL (ref 0.61–1.24)
GFR, Estimated: >60 mL/min (ref >60)
Glucose, Bld: 117 mg/dL — ABNORMAL HIGH (ref 70–99)
Potassium: 3.8 mmol/L (ref 3.5–5.1)
Sodium: 134 mmol/L — ABNORMAL LOW (ref 135–145)

## 2022-12-24 LAB — MAGNESIUM: Magnesium: 1.7 mg/dL (ref 1.7–2.4)

## 2022-12-24 MED ORDER — TORSEMIDE 20 MG PO TABS
40.0000 mg | ORAL_TABLET | Freq: Two times a day (BID) | ORAL | 11 refills | Status: DC
  Filled 2022-12-24 – 2023-01-14 (×2): qty 120, 30d supply, fill #0
  Filled 2023-05-10: qty 120, 30d supply, fill #1

## 2022-12-24 MED FILL — Heparin Sodium (Porcine) Inj 1000 Unit/ML: INTRAMUSCULAR | Qty: 10 | Status: AC

## 2022-12-24 NOTE — Telephone Encounter (Signed)
Message sent to [facility]

## 2022-12-24 NOTE — TOC Initial Note (Signed)
Transition of Care [facility]) - Initial/Assessment Note    Patient Details  Name: Kyle Dawson MRN: [id number] Date of Birth: 01-31-1966  [facility] CM/SW Contact:    Leone Haven, RN Phone Number: 12/24/2022, 11:29 AM  Clinical Narrative:                 From home alone, has no PCP and  no insurance on file,NCM asked if he would like to be set up with a [facility], he states he already has an apt scheduled for 8/20 at the [facility] clinic, states has no HH services in place at this time or DME at home.  States he will drive himself home at dc and he has no support system, states gets medications from [facility].  Pta self ambulatory.  He states he has to be dc today or he is leaving AMA.  NCM asked if he needed any ast with medications, he states no he has all his medications.   Expected Discharge Plan: Home/Self Care Barriers to Discharge: Continued Medical Work up   Patient Goals and CMS Choice Patient states their goals for this hospitalization and ongoing recovery are:: return home   Choice offered to / list presented to : NA      Expected Discharge Plan and Services In-house Referral: NA Discharge Planning Services: CM Consult Post Acute Care Choice: NA Living arrangements for the past 2 months: Single Family Home                 DME Arranged: N/A DME Agency: NA       HH Arranged: NA          Prior Living Arrangements/Services Living arrangements for the past 2 months: Single Family Home Lives with:: Self Patient language and need for interpreter reviewed:: Yes Do you feel safe going back to the place where you live?: Yes      Need for Family Participation in Patient Care: No (Comment) Care giver support system in place?: No (comment)   Criminal Activity/Legal Involvement Pertinent to Current Situation/Hospitalization: No - Comment as needed  Activities of Daily Living      Permission Sought/Granted                   Emotional Assessment   Attitude/Demeanor/Rapport: Engaged Affect (typically observed): Appropriate Orientation: : Oriented to Self, Oriented to Place, Oriented to  Time, Oriented to Situation   Psych Involvement: No (comment)  Admission diagnosis:  Systolic heart failure (HCC) [I50.20] Patient Active Problem List   Diagnosis Date Noted   Systolic heart failure (HCC) 12/23/2022   Persistent atrial fibrillation (HCC) 10/09/2020   Secondary hypercoagulable state (HCC) 10/09/2020   Recovering alcoholic in remission (HCC) 12/05/2019   Abdominal pain 05/26/2019   Odynophagia 05/26/2019   Demand ischemia 05/26/2019   Chronic post-traumatic stress disorder (PTSD) 05/26/2019   Atrial fibrillation with rapid ventricular response (HCC) 05/25/2019   Persistent left SVC (superior vena cava) 09/26/2018   Paroxysmal atrial fibrillation (HCC) 09/25/2018   Hyperglycemia 09/25/2018   Type 2 diabetes mellitus (HCC) 09/25/2018   Chest pain    SOB (shortness of breath)    Hx of transient ischemic attack (TIA) 11/26/2017   Alcohol use 11/26/2017   Elevated ALT measurement 11/26/2017   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 [place] [address] Phone: [phone] Fax: [phone]     Social Determinants of Health (SDOH) Social History: SDOH Screenings   Depression (PHQ2-9): Low Risk  (12/05/2020)  Tobacco Use: Low Risk  (12/22/2022)   SDOH Interventions:     Readmission Risk Interventions     No data to display

## 2022-12-24 NOTE — Progress Notes (Signed)
Mobility Specialist Progress Note:   12/24/22 1156  Mobility  Activity Ambulated independently in hallway  Level of Assistance Standby assist, set-up cues, supervision of patient - no hands on  Assistive Device None  Distance Ambulated (ft) 400 ft  Activity Response Tolerated well  Mobility Referral Yes  $Mobility charge 1 Mobility  Mobility Specialist Start Time (ACUTE ONLY) 1025  Mobility Specialist Stop Time (ACUTE ONLY) 1035  Mobility Specialist Time Calculation (min) (ACUTE ONLY) 10 min   Pre Mobility: 76 HR ,  During Mobility: 89 HR  Post Mobility: 83 HR   Pt received in chair, agreeable to mobility. Denied any feeling of discomfort or pain during ambulation, asymptomatic throughout. Pt returned to chair with RN present in room.   Leory Plowman  Mobility Specialist Please contact via [employer] office at [phone]

## 2022-12-24 NOTE — Discharge Summary (Addendum)
Discharge Summary    Patient ID: Kyle Dawson MRN: [id number]; DOB: [birth year]  Admit date: 12/23/2022 Discharge date: 12/24/2022  PCP:  Oneita Hurt, No   [facility] Providers Cardiologist:  Dietrich Pates, MD  Electrophysiologist:  Regan Lemming, MD       Discharge Diagnoses    Principal Problem:   Systolic heart failure [facility]) Active Problems:   Hypertensive heart disease   Type 2 diabetes mellitus (HCC)   Persistent left SVC (superior vena cava)   Recovering alcoholic in remission (HCC)   Persistent atrial fibrillation (HCC)   Secondary hypercoagulable state (HCC)   NICM (nonischemic cardiomyopathy) (HCC)   Acute on chronic systolic heart failure [facility])   Diagnostic Studies/Procedures    Right and left heart catheterization 12/23/22:   Ost Cx to Prox Cx lesion is 20% stenosed.   Prox LAD lesion is 50% stenosed.   1.  Mild obstructive coronary artery disease that does not explain the patient's cardiomyopathy.  An FFR angiography assessment of the LAD was performed with the results of 0.95.  Therefore intervention was deferred. 2.  Fick cardiac output of 6.7 L/min and Fick cardiac index of 3.2 L/min/m with the following hemodynamics:             Right atrial pressure mean of 10 mmHg             Right ventricular pressure 73/4 with an end-diastolic pressure of 19 mmHg             PA pressure 77/21 with a mean of 45 mmHg             Wedge pressure mean of 24 mmHg with V waves to 34 mmHg             PVR of 3.1 Woods units 3.  LVEDP of 17 mmHg\ 4.  Presence of left sided SVC.   Commendation: Augment diuresis.  The patient will receive IV torsemide here in the hospital and be discharged on 40 mg torsemide twice daily (from his home dose of 40 mg daily).  We will refer the patient to [facility] clinic for heart failure continuity of care. _____________   History of Present Illness     Kyle Dawson is a 57 y.o. male with CAD by CT, PAF s/p prior ablations 04/2021,  TIA, DM, HTN, HLD, ETOH use, CKD stage 2 by prior labs, hepatic steatosis, prolonged QT, cardiomyopathy of undetermined etiology and chronic HFrEF.   In 12/2017 he presented to the emergency room with chest discomfort that was atypical.  Coronary CTA showed nonobstructive CAD with 50 to 75% proximal to mid LAD lesion involving D2 with negative FFR.  Echocardiogram at that time indicated EF 55 to 60%, moderate LVH, small to moderate pericardial effusion.   On 09/2018 he presented to the emergency room and was found to be in atrial fibrillation with RVR.  While inpatient he had spontaneous conversion from atrial fibrillation back to normal sinus rhythm.  On echocardiogram at that time he was noted to have moderate to severe LVH consistent with hypertensive heart disease.  He reported running out of his blood pressure medications 2 weeks prior to event. Over subsequent years, he had additional issues with persistent atrial fibrillation despite DCCV as well as echo in 01/2021 demonstrating decline in LVEF to 40-45%. Therefore he underwent ablation with Dr. Elberta Fortis on 04/22/21. He was supposed to follow-up with EP 3 months thereafter, but has not been seen by [facility] since then.  He was last seen in clinic on 06/26/2021 by Alphonzo Severance, PA for follow up regarding intermittent hypotension so irbesartan was decreased.    Per patient in May/June he started noting heart rates in the 120's-140's on pulse ox. He started taking his mothers sotalol in addition to [place]. He also started noting increased lower extremity edema, started taking his mothers furosemide. He did present to an urgent care for evaluation, requested Torsemide, it was recommended he present to the emergency room, he declined at that time.    On chart review he presented to [facility] on 11/18/22 for increased shortness of breath and lower extremity edema.  He was admitted for acute exacerbation of CHF and AKI. BNP 2862. Troponins were  negative. Not on anticoagulation prior to admission, was restarted on Eliquis, noted he had not been taking multiple medications due to cost. Sotalol and dronderone were discontinued during admission for prolonged QTC. AKI resolved while inpatient, was transitioned from Lasix to Torsemide. Echocardiogram indicated EF 35-40%. Normal RV. Mild MR/TR Normal atrial sizes. RVSP . He was discharged on 11/21/22 with recommendation of close follow up with cardiology.    On 12/13/2022 he notified the office of 4+ leg and pedal edema as well as shortness of breath while vacationing in [place] in early July, he was starting to run out of Torsemide. Dr. Tenny Craw provided prescription of Torsemide 20mg  daily.    He was seen by Reather Littler NP in clinic and reported SOB and LE edema.  He does report significant history of alcohol abuse, up until 07/15/22 he reports drinking a quart of bourbon a night, states he has since stopped drinking. 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   Hospital Course     Consultants: none  NICM Acute on chronic systolic heart failure Pulmonary hypertension - LVEF 35-40% 11/2022 - elevated LVEDP on cath yesterday - will increase torsemide to 40 mg BID from daily - GDMT: 50 mg toprol, 50 mg losartan - will hold 2.5 mg amlodipine - could consider adding spironolactone at OP follow up - sCr 1.18 with K 3.8 - increase torsemide to 40 mg BID - has not been on a potassium supplement - BMP in 1 week - NICM felt may be related to heavy alcohol use (was drinking a quart of bourbon daily - GDMT may be limited by renal function and cost, if BP control needed, would consider imdur - he refused IV lasix this morning   Nonobstructive CAD - by CT and heart cath yesterday - risk factor modification   CKD stage II - sCr 1.18 with K 3.8 today - declined IV diuresis today - will need BMP in 1 week   PAF S/p ablation Chronic anticoagulation - maintaining sinus  rhythm with HR 60-70s - resume eliquis today - no bleeding issues - previously on toprol and 400 mg multaq BID - hasn't taken multaq in several months (last seen by EP 06/2021) - will discharge on toprol only   Hypertension - managed in the context of NICM   Called to bedside as patient was threatening to leave AMA. Pt seen and medications sent to [facility] OP pharmacy.  Recommend BMP in 1 week with close follow up in cardiology clinic.        Did the patient have an acute coronary syndrome (MI, NSTEMI, STEMI, etc) this admission?:  No  Did the patient have a percutaneous coronary intervention (stent / angioplasty)?:  No.        The patient will be scheduled for a TOC follow up appointment in 7-14 days.  A message has been sent to the [facility] and Scheduling Pool at the office where the patient should be seen for follow up.  _____________  Discharge Vitals Blood pressure (!) 153/90, pulse 71, temperature 97.8 F (36.6 C), temperature source Oral, resp. rate 11, height 5\' 10"  (1.778 m), weight 96 kg, SpO2 97%.  Filed Weights   12/23/22 0902  Weight: 96 kg    Physical Exam Constitutional:      Appearance: Normal appearance. He is obese.  HENT:     Head: Normocephalic and atraumatic.  Eyes:     Extraocular Movements: Extraocular movements intact.  Cardiovascular:     Rate and Rhythm: Normal rate and regular rhythm.     Pulses: Normal pulses.     Heart sounds: Normal heart sounds.  Pulmonary:     Comments: Crackles in bases Abdominal:     General: Bowel sounds are normal.     Palpations: Abdomen is soft.  Musculoskeletal:     Cervical back: Normal range of motion and neck supple.     Right lower leg: Edema present.     Left lower leg: Edema present.  Skin:    General: Skin is warm and dry.  Neurological:     Mental Status: He is alert and oriented to person, place, and time.  Psychiatric:        Mood and Affect: Mood normal.    Right  radial cath site C/D/I  Labs & Radiologic Studies    CBC Recent Labs    12/23/22 1104 12/23/22 1108  HGB 12.9* 12.2*  HCT 38.0* 36.0*   Basic Metabolic Panel Recent Labs    16/10/96 1108 12/24/22 0043  NA 140 134*  K 3.4* 3.8  CL  --  93*  CO2  --  28  GLUCOSE  --  117*  BUN  --  22*  CREATININE  --  1.18  CALCIUM  --  9.4  MG  --  1.7   Liver Function Tests No results for input(s): "AST", "ALT", "ALKPHOS", "BILITOT", "PROT", "ALBUMIN" in the last 72 hours. No results for input(s): "LIPASE", "AMYLASE" in the last 72 hours. High Sensitivity Troponin:   No results for input(s): "TROPONINIHS" in the last 720 hours.  BNP Invalid input(s): "POCBNP" D-Dimer No results for input(s): "DDIMER" in the last 72 hours. Hemoglobin A1C No results for input(s): "HGBA1C" in the last 72 hours. Fasting Lipid Panel No results for input(s): "CHOL", "HDL", "LDLCALC", "TRIG", "CHOLHDL", "LDLDIRECT" in the last 72 hours. Thyroid Function Tests No results for input(s): "TSH", "T4TOTAL", "T3FREE", "THYROIDAB" in the last 72 hours.  Invalid input(s): "FREET3" _____________  CARDIAC CATHETERIZATION  Result Date: 12/23/2022   Suezanne Jacquet Cx to Prox Cx lesion is 20% stenosed.   Prox LAD lesion is 50% stenosed. 1.  Mild obstructive coronary artery disease that does not explain the patient's cardiomyopathy.  An FFR angiography assessment of the LAD was performed with the results of 0.95.  Therefore intervention was deferred. 2.  Fick cardiac output of 6.7 L/min and Fick cardiac index of 3.2 L/min/m with the following hemodynamics:  Right atrial pressure mean of 10 mmHg  Right ventricular pressure 73/4 with an end-diastolic pressure of 19 mmHg  PA pressure 77/21 with a mean of 45 mmHg  Wedge pressure mean of  24 mmHg with V waves to 34 mmHg  PVR of 3.1 Woods units 3.  LVEDP of 17 mmHg\ 4.  Presence of left sided SVC. Commendation: Augment diuresis.  The patient will receive IV torsemide here in the hospital  and be discharged on 40 mg torsemide twice daily (from his home dose of 40 mg daily).  We will refer the patient to [facility] clinic for heart failure continuity of care.   Disposition   Pt is being discharged home today in good condition.  Follow-up Plans & Appointments    Follow up has been arranged with [facility] and Afib clinic.  Discharge Instructions     Diet - low sodium heart healthy   Complete by: As directed    Discharge instructions   Complete by: As directed    No driving for 2 days. No lifting over 5 lbs for 1 week. No sexual activity for 1 week. Keep procedure site clean & dry. If you notice increased pain, swelling, bleeding or pus, call/return!  You may shower, but no soaking baths/hot tubs/pools for 1 week.   Increase activity slowly   Complete by: As directed         Discharge Medications   Allergies as of 12/24/2022       Reactions   Oxycodone Anxiety, Other (See Comments)   "I drank it with beer and it made me hyper"        Medication List     STOP taking these medications    amLODipine 2.5 MG tablet Commonly known as: NORVASC       TAKE these medications    apixaban 5 MG Tabs tablet Commonly known as: ELIQUIS Take 1 tablet (5 mg) by mouth 2 times daily.   glipiZIDE 5 MG tablet Commonly known as: GLUCOTROL Take 1 tablet by mouth 2  times daily before a meal.   losartan 50 MG tablet Commonly known as: COZAAR Take 1 tablet (50 mg total) by mouth daily.   metoprolol succinate 50 MG 24 hr tablet Commonly known as: TOPROL-XL Take 1 tablet (50 mg total) by mouth in the morning and at bedtime.   multivitamin with minerals tablet Take 1 tablet by mouth daily.   OneTouch Delica Lancets 33G Misc USE TO CHECK BLOOD SUGAR ONCE DAILY   sildenafil 100 MG tablet Commonly known as: VIAGRA Take 1 tablet (100 mg total) by mouth daily as needed for erectile dysfunction   tamsulosin 0.4 MG Caps capsule Commonly known as: FLOMAX Take 1 capsule  (0.4 mg total) by mouth daily after supper.   torsemide 20 MG tablet Commonly known as: DEMADEX Take 2 tablets (40 mg total) by mouth 2 (two) times daily. What changed: when to take this           Outstanding Labs/Studies   BMP in 1 week  Duration of Discharge Encounter   Greater than 30 minutes including physician time.  Signed, Kyle Rutherford Duke, PA 12/24/2022, 2:09 PM   Patient seen and examined. Agree with assessment and plan.  Data reviewed.  Nonobstructive CAD with 2% LAD stenosis with normal FFR, 20% ostial circumflex and mild luminal irregularities.  Right heart cath demonstrates significant right heart pressure elevation with PA pressure 77/21, mean 45.  Wedge pressure mean 24 V waves up to 34.  PVR 3.1 Woods units.  LVEDP increased at 17 mm.  Right radial and right brachial cath site stable.  No JVD.  Lungs with minimal basilar rales.  Rhythm regular without ectopy,  1/6 systolic murmur, nontender.  No significant lower extremity edema.  Plan for increased diuresis and he received 2 doses of IV torsemide yesterday.  He refused this morning dose of IV torsemide.  He will be discharged on oral torsemide 20 mg twice a day increased from his 40 mg daily prior dosing.  Continue losartan and metoprolol succinate..  Plan for discharge today with follow-up in office in 1 to 2 weeks with subsequent follow-up with Dr. Tenny Craw his primary cardiologist.   Lennette Bihari, MD, [facility] 12/24/2022 2:16 PM

## 2022-12-24 NOTE — TOC Transition Note (Signed)
Transition of Care [facility]) - CM/SW Discharge Note   Patient Details  Name: Kyle Dawson MRN: [id number] Date of Birth: 1966-03-19  Transition of Care [facility]) CM/SW Contact:  Leone Haven, RN Phone Number: 12/24/2022, 3:11 PM   Clinical Narrative:    Patient dc , no needs.   Final next level of care: Home/Self Care Barriers to Discharge: Continued Medical Work up   Patient Goals and CMS Choice   Choice offered to / list presented to : NA  Discharge Placement                         Discharge Plan and Services Additional resources added to the After Visit Summary for   In-house Referral: NA Discharge Planning Services: CM Consult Post Acute Care Choice: NA          DME Arranged: N/A DME Agency: NA       HH Arranged: NA          Social Determinants of Health (SDOH) Interventions SDOH Screenings   Depression (PHQ2-9): Low Risk  (12/05/2020)  Tobacco Use: Low Risk  (12/22/2022)     Readmission Risk Interventions     No data to display

## 2022-12-25 ENCOUNTER — Other Ambulatory Visit (HOSPITAL_COMMUNITY): Payer: Self-pay

## 2022-12-27 ENCOUNTER — Encounter (HOSPITAL_COMMUNITY): Payer: Self-pay | Admitting: Internal Medicine

## 2022-12-27 ENCOUNTER — Ambulatory Visit (HOSPITAL_COMMUNITY)
Admission: RE | Admit: 2022-12-27 | Discharge: 2022-12-27 | Disposition: A | Discharge status: Home / Self Care | Payer: Self-pay | Source: Ambulatory Visit | Attending: Internal Medicine | Admitting: Internal Medicine

## 2022-12-27 ENCOUNTER — Other Ambulatory Visit (HOSPITAL_COMMUNITY): Payer: Self-pay

## 2022-12-27 VITALS — BP 130/60 | HR 61 | Ht 70.0 in | Wt 213.8 lb

## 2022-12-27 DIAGNOSIS — I4819 Other persistent atrial fibrillation: Secondary | ICD-10-CM | POA: Insufficient documentation

## 2022-12-27 DIAGNOSIS — I110 Hypertensive heart disease with heart failure: Secondary | ICD-10-CM | POA: Insufficient documentation

## 2022-12-27 DIAGNOSIS — D6869 Other thrombophilia: Secondary | ICD-10-CM | POA: Insufficient documentation

## 2022-12-27 DIAGNOSIS — E669 Obesity, unspecified: Secondary | ICD-10-CM | POA: Insufficient documentation

## 2022-12-27 DIAGNOSIS — R0683 Snoring: Secondary | ICD-10-CM | POA: Insufficient documentation

## 2022-12-27 DIAGNOSIS — Z6830 Body mass index (BMI) 30.0-30.9, adult: Secondary | ICD-10-CM | POA: Insufficient documentation

## 2022-12-27 DIAGNOSIS — I5020 Unspecified systolic (congestive) heart failure: Secondary | ICD-10-CM | POA: Insufficient documentation

## 2022-12-27 NOTE — Progress Notes (Signed)
Primary Care Physician: Pcp, No Primary Cardiologist: Dr Tenny Craw Primary Electrophysiologist: Dr Elberta Fortis Referring Physician: Dr Felix Pacini Kyle Dawson is a 57 y.o. male with a history of CAD, systolic dysfunction, DM, HTN, HLD, alcohol abuse, prolonged QT, atrial fibrillation who presents for follow up in the [facility]. He had a hospitalization in 2021 with atrial fibrillation and rapid rates.  He had been drinking drinking prior to that. Patient is on Eliquis for a CHADS2VASC score of 4. He was seen by Dr Elberta Fortis on 09/09/20 and was in rapid afib. His metoprolol was increased and he was started on Multaq. Patient is s/p afib ablation with Dr Elberta Fortis on 04/22/21.   On follow up today, patient reports that his dizziness an BP have improved since his last visit. He does still have intermittent lightheadedness when standing. He remains in SR.  On follow up 12/27/22, he is currently in NSR. He is s/p [facility] hospital July 2024 admission for CHF. He was discovered to be taking Multaq and Sotalol of a family member; both discontinued due to prolonged QT interval. History of significant alcohol abuse per report quart of liquor daily; he stopped drinking in March 2024. Underwent heart cath 8/15 which showed mild obstructive CAD and discharged on Torsemide 40 mg BID; referred to HF clinic. He is currently taking Toprol 50 mg BID.   Today, he denies symptoms of palpitations, chest pain, shortness of breath, orthopnea, PND, lower extremity edema, presyncope, syncope, snoring, daytime somnolence, bleeding, or neurologic sequela. The patient is tolerating medications without difficulties and is otherwise without complaint today.    Atrial Fibrillation Risk Factors:  he does have symptoms or diagnosis of sleep apnea. he does not have a history of rheumatic fever. he does have a history of alcohol use.   he has a BMI of Body mass index is 30.68 kg/m.Marland Kitchen Filed Weights    12/27/22 1427  Weight: 97 kg     Atrial Fibrillation Management history:  Previous antiarrhythmic drugs: Multaq, sotalol (he was taking both at the same time) Previous cardioversions: none Previous ablations: 04/22/21 CHADS2VASC score: 4 Anticoagulation history: Eliquis   Past Medical History:  Diagnosis Date   Basal cell carcinoma    Chest pain    Diabetes mellitus without complication (HCC)    Edema    lower extremity   Hypertension    Squamous cell carcinoma    R ear, nose, each side of face   Past Surgical History:  Procedure Laterality Date   ATRIAL FIBRILLATION ABLATION N/A 04/22/2021   Procedure: ATRIAL FIBRILLATION ABLATION;  Surgeon: Regan Lemming, MD;  Location: [facility] INVASIVE CV LAB;  Service: Cardiovascular;  Laterality: N/A;   Removal basal cell carcinoma     Removal squamous cell carcinoma     RIGHT/LEFT HEART CATH AND CORONARY ANGIOGRAPHY N/A 12/23/2022   Procedure: RIGHT/LEFT HEART CATH AND CORONARY ANGIOGRAPHY;  Surgeon: [name], MD;  Location: [facility] INVASIVE CV LAB;  Service: Cardiovascular;  Laterality: N/A;    Current Outpatient Medications  Medication Sig Dispense Refill   apixaban (ELIQUIS) 5 MG TABS tablet Take 1 tablet (5 mg) by mouth 2 times daily. 60 tablet 11   glipiZIDE (GLUCOTROL) 5 MG tablet Take 1 tablet by mouth 2  times daily before a meal. 90 tablet 3   losartan (COZAAR) 50 MG tablet Take 1 tablet (50 mg total) by mouth daily. 30 tablet 11   metoprolol succinate (TOPROL-XL) 50 MG 24 hr tablet Take  1 tablet (50 mg total) by mouth in the morning and at bedtime. 60 tablet 11   Multiple Vitamins-Minerals (MULTIVITAMIN WITH MINERALS) tablet Take 1 tablet by mouth daily.     OneTouch Delica Lancets 33G MISC USE TO CHECK BLOOD SUGAR ONCE DAILY 100 each 1   sildenafil (VIAGRA) 100 MG tablet Take 1 tablet (100 mg total) by mouth daily as needed for erectile dysfunction 30 tablet 0   tamsulosin (FLOMAX) 0.4 MG CAPS capsule Take 1 capsule  (0.4 mg total) by mouth daily after supper. 30 capsule 0   torsemide (DEMADEX) 20 MG tablet Take 2 tablets (40 mg total) by mouth 2 (two) times daily. 120 tablet 11   No current facility-administered medications for this encounter.    No Known Allergies  ROS- All systems are reviewed and negative except as per the HPI above.  Physical Exam: Vitals:   12/27/22 1427  BP: 130/60  Pulse: 61  Weight: 97 kg  Height: 5\' 10"  (1.778 m)    GEN- The patient is well appearing, alert and oriented x 3 today.   Neck - no JVD or carotid bruit noted Lungs- Clear to ausculation bilaterally, normal work of breathing Heart- Regular rate and rhythm, no murmurs, rubs or gallops, PMI not laterally displaced Extremities- no clubbing, cyanosis, or edema Skin - no rash or ecchymosis noted   Wt Readings from Last 3 Encounters:  12/27/22 97 kg  12/23/22 96 kg  12/21/22 96.1 kg    EKG today demonstrates  Vent. rate 61 BPM PR interval 142 ms QRS duration 72 ms QT/QTcB 434/436 ms P-R-T axes 8 24 54 Normal sinus rhythm Low voltage QRS Borderline ECG When compared with ECG of 20-Dec-2022 08:57, PREVIOUS ECG IS PRESENT  Echo 05/26/19 demonstrated   1. Left ventricular ejection fraction, by visual estimation, is 60 to  65%. The left ventricle has normal function. There is moderately increased left ventricular hypertrophy.   2. Left ventricular diastolic function could not be evaluated.   3. The left ventricle has no regional wall motion abnormalities.   4. Global right ventricle was not well visualized.The right ventricular  size is not well visualized. Right vetricular wall thickness was not  assessed.   5. Left atrial size was normal.   6. Right atrial size was normal.   7. The mitral valve is grossly normal. No evidence of mitral valve  regurgitation.   8. The tricuspid valve is grossly normal.   9. The aortic valve was not well visualized. Aortic valve regurgitation  is not visualized.   10. The pulmonic valve was grossly normal. Pulmonic valve regurgitation is not visualized.  11. 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  12. The inferior vena cava is normal in size with <50% respiratory  variability, suggesting right atrial pressure of 8 mmHg.  13. The interatrial septum was not well visualized.   Epic records are reviewed at length today  CHA2DS2-VASc Score = 6  The patient's score is based upon: CHF History: 1 HTN History: 1 Diabetes History: 1 Stroke History: 2 Vascular Disease History: 1 Age Score: 0 Gender Score: 0    ASSESSMENT AND PLAN: 1. Persistent Atrial Fibrillation (ICD10:  I48.19) The patient's CHA2DS2-VASc score is 6, indicating a 9.7% annual risk of stroke.   S/p afib ablation 04/22/21.  He is currently in NSR.  He was admitted in [place] in July and found to be taking multiple AAD medications at the same time. They were  both discontinued due to prolonged QT interval.   Will have patient reestablish with Dr. Elberta Fortis as has not been seen by EP since ablation. Rhythm monitoring device recommended.   2. Secondary Hypercoagulable State (ICD10:  D68.69) The patient is at significant risk for stroke/thromboembolism based upon his CHA2DS2-VASc Score of 6.  Continue Apixaban (Eliquis).   3. Obesity Body mass index is 30.68 kg/m. Lifestyle modification was discussed and encouraged including regular physical activity and weight reduction.  4. Snoring/suspected OSA Sleep study previously ordered but not completed.   5. CAD No anginal symptoms.  6. HTN BP still borderline today with intermittent lightheadedness. Decrease irbesartan to 75 mg daily  7. Systolic dysfunction EF 40-45% No signs or symptoms of fluid overload.   Follow up with Dr Elberta Fortis as scheduled.    Lake Bells, PA-C Afib Clinic [facility] [address] [place], [place]  [address] [phone] 12/27/2022 2:55 PM

## 2022-12-28 LAB — LIPOPROTEIN A (LPA): Lipoprotein (a): 19.6 nmol/L (ref <75.0)

## 2022-12-29 ENCOUNTER — Encounter (HOSPITAL_COMMUNITY): Payer: Self-pay

## 2022-12-30 NOTE — Progress Notes (Signed)
HEART & VASCULAR TRANSITION OF CARE CONSULT NOTE     Referring Physician: Dr Lynnette Caffey Primary Care: [facility]  Primary Cardiologist:Dr Tenny Craw EP: Dr Elberta Fortis Endocrinology: Dr Erroll Luna   HPI: Referred to clinic by Dr Lynnette Caffey for heart failure consultation.   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  Of note he does not diurese well IV lasix but has better response with torsemide.   Admitted 2019 with chest pain. Coronary CTA showed nonobstructive CAD with 50 to 75% proximal to mid LAD lesion involving D2 with negative FFR.  Echocardiogram at that time indicated EF 55 to 60%, moderate LVH, small to moderate pericardial effusion.  Over the years he has had issues with A fib. Multiple cardioversions. Saw EP and underwent A fib ablation back in 2020.    Admitted 11/2022 with A/C HFrEF in [place]. He had been off all medications for 1 year. Multiple AA stopped due to prolonged QT. Echo showed EF 40-45%. Had been out of some medications due to cost. Diuresed with IV lasix. Eliquis restarted.   He was seen in the [facility] 12/20/22.Due to worsening HF symptoms he was set up for cath.   Admitted 12/23/22 after cath because he didn't have a caregiver. ath showed mild obstructive cad, preserved cardiac output and elevated filling pressures. Diuresed with IV lasix and transitioned to torsemide. Discharge weight 213 pounds.   He was seen in the A fib clinic 12/27/22. Stable at that time.    Today he returns for HF follow up.Overall feeling fine. Denies SOB/PND/Orthopnea. Able walk up steps. Appetite ok. No fever or chills. Weight at home 211  pounds. Taking all medications. He is uninsured. Currently not working but starting a job at [employer].  Lives alone.   Cardiac Testing  Cath 12/2022   Ost Cx to Prox Cx lesion is 20% stenosed.   Prox LAD lesion is 50% stenosed.    1.  Mild obstructive coronary artery disease that does not explain the patient's cardiomyopathy.  An FFR angiography assessment of the LAD was performed with the results of 0.95.  Therefore intervention was deferred. 2.  Fick cardiac output of 6.7 L/min and Fick cardiac index of 3.2 L/min/m with the following hemodynamics:             Right atrial pressure mean of 10 mmHg             Right ventricular pressure 73/4 with an end-diastolic pressure of 19 mmHg             PA pressure 77/21 with a mean of 45 mmHg             Wedge pressure mean of 24 mmHg with V waves to 34 mmHg             PVR of 3.1 Woods units 3.  LVEDP of 17 mmHg\ 4.  Presence of left sided SVC.  Echo 11/2022 from [place]  EF 35-40%. Normal RV. Mild MR/TR Normal atrial sizes. RVSP .   Echo 2022  1. Left ventricular ejection fraction, by estimation, is 40 to 45%. The  left ventricle has mildly decreased function. The left ventricle  demonstrates global hypokinesis. Left ventricular diastolic parameters are  consistent with Grade I diastolic  dysfunction (impaired relaxation).   2. Right ventricular systolic function is normal. The right ventricular  size is normal.   3. The mitral valve is normal in structure. Mild mitral valve  regurgitation. No evidence of mitral stenosis.   4. The aortic valve is normal in structure. Aortic valve regurgitation is  not visualized. No aortic stenosis is present.   Review of Systems: [y] = yes, [ ]  = no   General: Weight gain [ ] ; Weight loss [ ] ; Anorexia [ ] ; Fatigue [ ] ; Fever [ ] ; Chills [ ] ; Weakness [ ]   Cardiac: Chest pain/pressure [ ] ; Resting SOB [ ] ; Exertional SOB [ ] ; Orthopnea [ ] ; Pedal Edema [ ] ; Palpitations [ ] ; Syncope [ ] ; Presyncope [ ] ; Paroxysmal nocturnal dyspnea[ ]   Pulmonary: Cough [ ] ; Wheezing[ ] ; Hemoptysis[ ] ; Sputum [ ] ; Snoring [ ]   GI: Vomiting[ ] ; Dysphagia[ ] ; Melena[ ] ; Hematochezia [ ] ; Heartburn[ ] ; Abdominal pain [ ] ; Constipation [ ] ;  Diarrhea [ ] ; BRBPR [ ]   GU: Hematuria[ ] ; Dysuria [ ] ; Nocturia[ ]   Vascular: Pain in legs with walking [ ] ; Pain in feet with lying flat [ ] ; Non-healing sores [ ] ; Stroke [ ] ; TIA [ ] ; Slurred speech [ ] ;  Neuro: Headaches[ ] ; Vertigo[ ] ; Seizures[ ] ; Paresthesias[ ] ;Blurred vision [ ] ; Diplopia [ ] ; Vision changes [ ]   Ortho/Skin: Arthritis [ ] ; Joint pain [Y ]; Muscle pain [ ] ; Joint swelling [ ] ; Back Pain [Y ]; Rash [ ]   Psych: Depression[ ] ; Anxiety[ ]   Heme: Bleeding problems [ ] ; Clotting disorders [ ] ; Anemia [ ]   Endocrine: Diabetes [Y ]; Thyroid dysfunction[ ]    Past Medical History:  Diagnosis Date   Basal cell carcinoma    Chest pain    Diabetes mellitus without complication (HCC)    Edema    lower extremity   Hypertension    Squamous cell carcinoma    R ear, nose, each side of face    Current Outpatient Medications  Medication Sig Dispense Refill   apixaban (ELIQUIS) 5 MG TABS tablet Take 1 tablet (5 mg) by mouth 2 times daily. 60 tablet 11   glipiZIDE (GLUCOTROL) 5 MG tablet Take 1 tablet by mouth 2  times daily before a meal. 90 tablet 3   losartan (COZAAR) 50 MG tablet Take 1 tablet (50 mg total) by mouth daily. 30 tablet 11   metoprolol succinate (TOPROL-XL) 50 MG 24 hr tablet Take 1 tablet (50 mg total) by mouth in the morning and at bedtime. 60 tablet 11   Multiple Vitamins-Minerals (MULTIVITAMIN WITH MINERALS) tablet Take 1 tablet by mouth daily.     OneTouch Delica Lancets 33G MISC USE TO CHECK BLOOD SUGAR ONCE DAILY 100 each 1   sildenafil (VIAGRA) 100 MG tablet Take 1 tablet (100 mg total) by mouth daily as needed for erectile dysfunction 30 tablet 0   tamsulosin (FLOMAX) 0.4 MG CAPS capsule Take 1 capsule (0.4 mg total) by mouth daily after supper. 30 capsule 0   torsemide (DEMADEX) 20 MG tablet Take 2 tablets (40 mg total) by mouth 2 (two) times daily. 120 tablet 11   No current facility-administered medications for this encounter.    No Known  Allergies    Social History   Socioeconomic History   Marital status: Single    Spouse name: Not on file   Number of children: 0   Years of education: ADN   Highest education level: Not on file  Occupational History   Occupation: ER [employer]    Employer: [employer]   Occupation:  [employer]  Tobacco Use   Smoking status: Never   Smokeless tobacco: Never   Tobacco comments:    Never smoked 12/27/22  Vaping Use   Vaping status: Never Used  Substance and Sexual Activity   Alcohol use: Not Currently    Alcohol/week: 3.0 - 6.0 standard drinks of alcohol    Types: 3 - 6 Shots of liquor per week    Comment: 1-2 shots every 2-3 days 06/26/2021   Drug use: No   Sexual activity: Not on file  Other Topics Concern   Not on file  Social History Narrative   Patient drinks 1 cup of coffee a day    Social Determinants of Health   Financial Resource Strain: Not on file  Food Insecurity: Not on file  Transportation Needs: Not on file  Physical Activity: Not on file  Stress: Not on file  Social Connections: Not on file  Intimate Partner Violence: Low Risk  (10/04/2019)   Received from [facility]   Intimate Partner Violence    Insults You: Not on file    Threatens You: Not on file    Screams at [place]: Not on file    Physically Hurt: Not on file    Intimate Partner Violence Score: Not on file      Family History  Problem Relation Age of Onset   Peripheral vascular disease Mother 50   Hypertension Mother    Heart failure Mother    CAD Maternal Grandfather    Heart failure Paternal Grandfather    Hypertension Father     Vitals:   12/31/22 0954  BP: 118/74  Pulse: 64  SpO2: 99%  Weight: 96.8 kg (213 lb 6.4 oz)   Wt Readings from Last 3 Encounters:  12/31/22 96.8 kg (213 lb 6.4 oz)  12/27/22 97 kg (213 lb 12.8 oz)  12/23/22 96 kg (211 lb 9.6 oz)     PHYSICAL EXAM: General:  Walked in the clinic. Well appearing. No respiratory difficulty HEENT: normal Neck:  supple. JVP 8-9 . Carotids 2+ bilat; no bruits. No lymphadenopathy or thryomegaly appreciated. Cor: PMI nondisplaced. Regular rate & rhythm. No rubs, gallops or murmurs. Lungs: clear Abdomen: soft, nontender, nondistended. No hepatosplenomegaly. No bruits or masses. Good bowel sounds. Extremities: no cyanosis, clubbing, rash, R and LLE 1+ edema with hyperpigmentation on anterior aspect.  Neuro: alert & oriented x 3, cranial nerves grossly intact. moves all 4 extremities w/o difficulty. Affect pleasant.  ECG: SR QTc 447 ms    ASSESSMENT & PLAN: 1. Chronic HFrEF, NICM  Echo completed in [place] 11/2022 EF 35-40% . Repeat ECHO after HF meds optimized  Cath 12/2022 nonobstructive CAD.  NYHA II GDMT  Diuretic-Volume status stable. Continue current dose of torsemide.  BB- Continue Toprol XL 50 mg daily , would not increase with heart rate in the 60s.  Ace/ARB/ARNI- Continue losartan 50 mg daily MRA- Consider adding next visit.  SGLT2i- Add jardiance 10 mg daily  . Referred to Pharmacy Team for patient assistance. Provide 30 day free card.  Check BMET   2. PAF S/P A fib Ablation 2022.  Previously on Multaq and Sotalol but stopped due to prolonged QTc.  EKG today. He is in SR.  Continue Toprol XL 50 mg daily  Continue eliquis 5 mg twice a day   3. DMII  Followed by Endocrinology  Hgb A1C 7.3   4. ETOH Previous abuse but quit drinking in March of this year.   He has established with  [facility].   Referred to HFSW (PCP, Medications, Transportation, ETOH Abuse, Drug Abuse, Insurance, Financial ):  No Refer to Pharmacy: Yes for Jardiance  Refer to Home Health:  No Refer to Advanced Heart Failure Clinic:  no  Refer to General Cardiology: He is established with Dr Tenny Craw   Provided with note for work. Plans to start new job soon.   Follow up as needed.   Aerie Donica NP-C  9:59 AM

## 2022-12-31 ENCOUNTER — Encounter (HOSPITAL_COMMUNITY): Payer: Self-pay

## 2022-12-31 ENCOUNTER — Telehealth (HOSPITAL_COMMUNITY): Payer: Self-pay

## 2022-12-31 ENCOUNTER — Other Ambulatory Visit: Payer: Self-pay

## 2022-12-31 ENCOUNTER — Other Ambulatory Visit (HOSPITAL_COMMUNITY): Payer: Self-pay

## 2022-12-31 ENCOUNTER — Ambulatory Visit (HOSPITAL_COMMUNITY)
Admit: 2022-12-31 | Discharge: 2022-12-31 | Disposition: A | Discharge status: Home / Self Care | Payer: Self-pay | Source: Ambulatory Visit | Attending: Adult Health | Admitting: Adult Health

## 2022-12-31 VITALS — BP 118/74 | HR 64 | Wt 213.4 lb

## 2022-12-31 DIAGNOSIS — I428 Other cardiomyopathies: Secondary | ICD-10-CM | POA: Insufficient documentation

## 2022-12-31 DIAGNOSIS — I480 Paroxysmal atrial fibrillation: Secondary | ICD-10-CM | POA: Insufficient documentation

## 2022-12-31 DIAGNOSIS — F1011 Alcohol abuse, in remission: Secondary | ICD-10-CM | POA: Insufficient documentation

## 2022-12-31 DIAGNOSIS — E1159 Type 2 diabetes mellitus with other circulatory complications: Secondary | ICD-10-CM

## 2022-12-31 DIAGNOSIS — F1010 Alcohol abuse, uncomplicated: Secondary | ICD-10-CM

## 2022-12-31 DIAGNOSIS — I130 Hypertensive heart and chronic kidney disease with heart failure and stage 1 through stage 4 chronic kidney disease, or unspecified chronic kidney disease: Secondary | ICD-10-CM | POA: Insufficient documentation

## 2022-12-31 DIAGNOSIS — E1122 Type 2 diabetes mellitus with diabetic chronic kidney disease: Secondary | ICD-10-CM | POA: Insufficient documentation

## 2022-12-31 DIAGNOSIS — N183 Chronic kidney disease, stage 3 unspecified: Secondary | ICD-10-CM | POA: Insufficient documentation

## 2022-12-31 DIAGNOSIS — I5022 Chronic systolic (congestive) heart failure: Secondary | ICD-10-CM | POA: Insufficient documentation

## 2022-12-31 LAB — BASIC METABOLIC PANEL
Anion gap: 9 (ref 5–15)
BUN: 19 mg/dL (ref 6–20)
CO2: 33 mmol/L — ABNORMAL HIGH (ref 22–32)
Calcium: 9.6 mg/dL (ref 8.9–10.3)
Chloride: 93 mmol/L — ABNORMAL LOW (ref 98–111)
Creatinine, Ser: 1.03 mg/dL (ref 0.61–1.24)
GFR, Estimated: >60 mL/min (ref >60)
Glucose, Bld: 196 mg/dL — ABNORMAL HIGH (ref 70–99)
Potassium: 3.6 mmol/L (ref 3.5–5.1)
Sodium: 135 mmol/L (ref 135–145)

## 2022-12-31 MED ORDER — EMPAGLIFLOZIN 10 MG PO TABS
10.0000 mg | ORAL_TABLET | Freq: Every day | ORAL | 1 refills | Status: DC
  Filled 2022-12-31: qty 30, 30d supply, fill #0
  Filled 2023-01-14 – 2023-02-04 (×2): qty 30, 30d supply, fill #1

## 2022-12-31 NOTE — Telephone Encounter (Signed)
Heart Failure Patient Advocate Encounter  Medication assistance forms for Jardiance have been started. Patient has signed forms.  Provider information will need to be completed before submitting.  Forms have been attached to patient chart under 'Media' tab. Routing to appropriate office.  Stephaine H, CPhT Rx Patient Advocate Phone: [phone] 

## 2022-12-31 NOTE — Patient Instructions (Signed)
Medication Changes:  START: JARDIANCE 10MG  ONCE DAILY   Lab Work:  Labs done today, your results will be available in MyChart, we will contact you for abnormal readings.  Follow-Up in: AS NEEDED   At the [facility], you and your health needs are our priority. We have a designated team specialized in the treatment of Heart Failure. This Care Team includes your primary Heart Failure Specialized Cardiologist (physician), Advanced Practice Providers (APPs- Physician Assistants and Nurse Practitioners), and Pharmacist who all work together to provide you with the care you need, when you need it.   You may see any of the following providers on your designated Care Team at your next follow up:  Dr. Arvilla Meres Dr. Marca Ancona Dr. Marcos Eke, NP Robbie Lis, [place] [facility], [place] Brynda Peon, NP Karle Plumber, PharmD   Please be sure to bring in all your medications bottles to every appointment.   Need to Contact us:  If you have any questions or concerns before your next appointment please send [place] a message through [place] or call our office at [phone].    TO LEAVE A MESSAGE FOR THE NURSE SELECT OPTION 2, PLEASE LEAVE A MESSAGE INCLUDING: YOUR NAME DATE OF BIRTH CALL BACK NUMBER REASON FOR CALL**this is important as we prioritize the call backs  YOU WILL RECEIVE A CALL BACK THE SAME DAY AS LONG AS YOU CALL BEFORE 4:00 PM

## 2023-01-02 NOTE — Telephone Encounter (Signed)
I haven't seen him since 2020. It looks like Tonye Becket, NP recently saw him and started SGLT2i. Will fwd to her as FYI. Tereso Newcomer, PA-C    01/02/2023 6:24 PM

## 2023-01-05 ENCOUNTER — Other Ambulatory Visit (HOSPITAL_COMMUNITY): Payer: Self-pay

## 2023-01-05 ENCOUNTER — Other Ambulatory Visit: Payer: Self-pay

## 2023-01-05 MED ORDER — DOXYCYCLINE HYCLATE 100 MG PO CAPS
100.0000 mg | ORAL_CAPSULE | Freq: Two times a day (BID) | ORAL | 2 refills | Status: DC
  Filled 2023-01-05: qty 60, 30d supply, fill #0
  Filled 2023-02-17: qty 60, 30d supply, fill #1

## 2023-01-11 DIAGNOSIS — I2510 Atherosclerotic heart disease of native coronary artery without angina pectoris: Secondary | ICD-10-CM | POA: Insufficient documentation

## 2023-01-11 DIAGNOSIS — I10 Essential (primary) hypertension: Secondary | ICD-10-CM | POA: Insufficient documentation

## 2023-01-11 DIAGNOSIS — E7800 Pure hypercholesterolemia, unspecified: Secondary | ICD-10-CM | POA: Insufficient documentation

## 2023-01-11 NOTE — Progress Notes (Signed)
Cardiology Office Note:    Date:  01/12/2023  ID:  Kyle Dawson, DOB 06-26-1965, MRN [id number] PCP: Oneita Hurt, No  [facility] Providers Cardiologist:  Dietrich Pates, MD Electrophysiologist:  Regan Lemming, MD       Patient Profile:      Coronary artery disease  CCTA 11/29/17: CAC score 838 (96%), 50-75 prox and mid LAD and D2 - FFR normal: LAD 0.92, D2 0.92 LHC 12/23/22: oLCx 20, pLAD 50 (FFR 0.95); CO 6.7, mean RA 10, mean PA 45, PCWP 24 Paroxysmal atrial fibrillation  S/p multiple DCCVs >> s/p PVI ablation in 04/2021 w Dr. Elberta Fortis  (HFrEF) heart failure with reduced ejection fraction  Non-ischemic cardiomyopathy; poss due to ETOH TTE 02/05/21: EF 40-45, Gr 1 DD, NL RVSF, mild MR, RAP 3  TTE 11/2022 [facility]): EF 35-40, RVSP 35, mild MR, mild TR Prolonged QT Diabetes mellitus  Hypertension  Hyperlipidemia  Chronic kidney disease   Alcohol abuse  Hepatic steatosis  Snoring         History of Present Illness:  Discussed the use of AI scribe software for clinical note transcription with the patient, who gave verbal consent to proceed.    A 57 year old male with a history of heart failure with reduced ejection fraction and atrial fibrillation presents for a follow-up visit post-hospitalization. He was admitted to a hospital in [place], [place] in July 2024 due to decompensated heart failure. His ejection fraction was lower at 35-40% at that time. He underwent right and left heart catheterization in August 2024, which revealed moderate non-obstructive CAD. He was admitted due to no transportation and was treated with IV diuresis. He is here alone today. He denies experiencing any shortness of breath, chest discomfort, or swelling.     ROS:  See HPI No melena, hematochezia, hematuria    Studies Reviewed:       Risk Assessment/Calculations:    CHA2DS2-VASc Score = 6   This indicates a 9.7% annual risk of stroke. The patient's score is based upon: CHF History: 1 HTN  History: 1 Diabetes History: 1 Stroke History: 2 Vascular Disease History: 1 Age Score: 0 Gender Score: 0            Physical Exam:   VS:  BP 120/70   Pulse 64   Ht 5\' 10"  (1.778 m)   Wt 217 lb 6.4 oz (98.6 kg)   SpO2 97%   BMI 31.19 kg/m    Wt Readings from Last 3 Encounters:  01/12/23 217 lb 6.4 oz (98.6 kg)  12/31/22 213 lb 6.4 oz (96.8 kg)  12/27/22 213 lb 12.8 oz (97 kg)    Constitutional:      Appearance: Healthy appearance. Not in distress.  Neck:     Vascular: No JVR. JVD normal.  Pulmonary:     Breath sounds: Normal breath sounds. No wheezing. No rales.  Cardiovascular:     Normal rate. Regular rhythm.     Murmurs: There is no murmur.  Edema:    Peripheral edema present.    Ankle: bilateral trace edema of the ankle. Abdominal:     Palpations: Abdomen is soft.      Assessment and Plan:     Heart Failure with Reduced Ejection Fraction (HFrEF) Recent hospitalization for decompensated heart failure. EF 35-40% on recent echocardiogram. He is improved and is now NYHA II-II.   - Continue Jardiance 10 mg once daily, Losartan 50 mg once daily, Toprol XL 50 mg once daily, and Torsemide 40  mg twice daily.  -Add Spironolactone 12.5mg  daily. -Check B-Met today and weekly for the next 2 weeks. -Plan for repeat echocardiogram in 3 months to reassess EF.  Atrial Fibrillation Maintaining sinus rhythm on exam.  -Continue Eliquis 5 mg twice daily and Toprol XL 50 mg once daily. -He is pending follow up with EP for management of atrial fibrillation.   Coronary Artery Disease Mild to moderate non-obstructive disease on recent cardiac catheterization. No symptoms of angina. -Continue current management.  Hypertension Controlled on current medications. -Continue Losartan 50 mg once daily, Metoprolol succinate 50 mg once daily.   Hyperlipidemia LDL during admission in [place] in 11/2022 was < 70. He is not on statin Rx. Continue dietary management.          Dispo:  Return  in about 3 months (around 04/13/2023) for Routine Follow Up, w/ Dr. Tenny Craw, or Tereso Newcomer, PA-C.  Signed, Tereso Newcomer, PA-C

## 2023-01-12 ENCOUNTER — Ambulatory Visit: Payer: Self-pay | Attending: Physician Assistant | Admitting: Physician Assistant

## 2023-01-12 ENCOUNTER — Other Ambulatory Visit (HOSPITAL_COMMUNITY): Payer: Self-pay

## 2023-01-12 ENCOUNTER — Encounter: Payer: Self-pay | Admitting: Physician Assistant

## 2023-01-12 VITALS — BP 120/70 | HR 64 | Ht 70.0 in | Wt 217.4 lb

## 2023-01-12 DIAGNOSIS — E7800 Pure hypercholesterolemia, unspecified: Secondary | ICD-10-CM

## 2023-01-12 DIAGNOSIS — I10 Essential (primary) hypertension: Secondary | ICD-10-CM

## 2023-01-12 DIAGNOSIS — I5020 Unspecified systolic (congestive) heart failure: Secondary | ICD-10-CM

## 2023-01-12 DIAGNOSIS — I2510 Atherosclerotic heart disease of native coronary artery without angina pectoris: Secondary | ICD-10-CM

## 2023-01-12 DIAGNOSIS — I480 Paroxysmal atrial fibrillation: Secondary | ICD-10-CM

## 2023-01-12 LAB — BASIC METABOLIC PANEL
BUN/Creatinine Ratio: 28 — ABNORMAL HIGH (ref 9–20)
BUN: 42 mg/dL — ABNORMAL HIGH (ref 6–24)
CO2: 28 mmol/L (ref 20–29)
Calcium: 10.1 mg/dL (ref 8.7–10.2)
Chloride: 98 mmol/L (ref 96–106)
Creatinine, Ser: 1.49 mg/dL — ABNORMAL HIGH (ref 0.76–1.27)
Glucose: 125 mg/dL — ABNORMAL HIGH (ref 70–99)
Potassium: 3.9 mmol/L (ref 3.5–5.2)
Sodium: 139 mmol/L (ref 134–144)
eGFR: 54 mL/min/{1.73_m2} — ABNORMAL LOW (ref >59)

## 2023-01-12 MED ORDER — SPIRONOLACTONE 25 MG PO TABS
12.5000 mg | ORAL_TABLET | Freq: Every day | ORAL | 3 refills | Status: DC
  Filled 2023-01-12: qty 45, 90d supply, fill #0

## 2023-01-12 NOTE — Telephone Encounter (Signed)
Form completed and in bottom tray on my desk. Tereso Newcomer, PA-C    01/12/2023 4:52 PM

## 2023-01-12 NOTE — Patient Instructions (Signed)
Medication Instructions:  Your physician has recommended you make the following change in your medication:   START Spironolactone 25 mg taking only 1/2 tablet daily  *If you need a refill on your cardiac medications before your next appointment, please call your pharmacy*   Lab Work: TODAY:  BMET  1 WEEK:  BMET  2 WEEKS:  BMET  If you have labs (blood work) drawn today and your tests are completely normal, you will receive your results only by: [employer] (if you have MyChart) OR A paper copy in the mail If you have any lab test that is abnormal or we need to change your treatment, we will call you to review the results.   Testing/Procedures: Your physician has requested that you have an Limited Echocardiogram 3 MONTHS. Echocardiography is a painless test that uses sound waves to create images of your heart. It provides your doctor with information about the size and shape of your heart and how well your heart's chambers and valves are working. This procedure takes approximately one hour. There are no restrictions for this procedure. Please do NOT wear cologne, perfume, aftershave, or lotions (deodorant is allowed). Please arrive 15 minutes prior to your appointment time.    Follow-Up: At [facility], you and your health needs are our priority.  As part of our continuing mission to provide you with exceptional heart care, we have created designated Provider Care Teams.  These Care Teams include your primary Cardiologist (physician) and Advanced Practice Providers (APPs -  Physician Assistants and Nurse Practitioners) who all work together to provide you with the care you need, when you need it.  We recommend signing up for the patient portal called "MyChart".  Sign up information is provided on this After Visit Summary.  MyChart is used to connect with patients for Virtual Visits (Telemedicine).  Patients are able to view lab/test results, encounter notes, upcoming  appointments, etc.  Non-urgent messages can be sent to your provider as well.   To learn more about what you can do with MyChart, go to [email or web address].    Your next appointment:   3 month(s) (AFTER ECHO)  Provider:   Dietrich Pates, MD     Other Instructions

## 2023-01-13 NOTE — Telephone Encounter (Signed)
Confirmation received.

## 2023-01-13 NOTE — Telephone Encounter (Signed)
Faxed completed form to fax # on paperwork.

## 2023-01-13 NOTE — Telephone Encounter (Signed)
Approval letter received from [employer] that pt has been approved for [employer].

## 2023-01-14 ENCOUNTER — Encounter (HOSPITAL_COMMUNITY): Payer: Self-pay

## 2023-01-14 ENCOUNTER — Encounter: Payer: Self-pay | Admitting: Endocrinology

## 2023-01-14 ENCOUNTER — Other Ambulatory Visit: Payer: Self-pay | Admitting: Cardiology

## 2023-01-14 ENCOUNTER — Other Ambulatory Visit (HOSPITAL_COMMUNITY): Payer: Self-pay

## 2023-01-14 ENCOUNTER — Other Ambulatory Visit: Payer: Self-pay

## 2023-01-14 NOTE — Telephone Encounter (Signed)
Pt's pharmacy is requesting a refill on sildenafil. Would the provider like to refill this medication? Please address

## 2023-01-17 ENCOUNTER — Ambulatory Visit: Payer: Self-pay | Attending: Physician Assistant

## 2023-01-17 DIAGNOSIS — I2510 Atherosclerotic heart disease of native coronary artery without angina pectoris: Secondary | ICD-10-CM

## 2023-01-17 DIAGNOSIS — I480 Paroxysmal atrial fibrillation: Secondary | ICD-10-CM

## 2023-01-17 DIAGNOSIS — I5020 Unspecified systolic (congestive) heart failure: Secondary | ICD-10-CM

## 2023-01-17 DIAGNOSIS — I10 Essential (primary) hypertension: Secondary | ICD-10-CM

## 2023-01-17 DIAGNOSIS — E7800 Pure hypercholesterolemia, unspecified: Secondary | ICD-10-CM

## 2023-01-18 LAB — BASIC METABOLIC PANEL
BUN/Creatinine Ratio: 20 (ref 9–20)
BUN: 22 mg/dL (ref 6–24)
CO2: 25 mmol/L (ref 20–29)
Calcium: 9.4 mg/dL (ref 8.7–10.2)
Chloride: 106 mmol/L (ref 96–106)
Creatinine, Ser: 1.12 mg/dL (ref 0.76–1.27)
Glucose: 136 mg/dL — ABNORMAL HIGH (ref 70–99)
Potassium: 4.3 mmol/L (ref 3.5–5.2)
Sodium: 142 mmol/L (ref 134–144)
eGFR: 76 mL/min/{1.73_m2} (ref >59)

## 2023-01-19 ENCOUNTER — Other Ambulatory Visit: Payer: Self-pay

## 2023-01-26 ENCOUNTER — Ambulatory Visit: Payer: Self-pay | Attending: Physician Assistant

## 2023-01-26 DIAGNOSIS — I5020 Unspecified systolic (congestive) heart failure: Secondary | ICD-10-CM

## 2023-01-26 DIAGNOSIS — E7800 Pure hypercholesterolemia, unspecified: Secondary | ICD-10-CM

## 2023-01-26 DIAGNOSIS — I10 Essential (primary) hypertension: Secondary | ICD-10-CM

## 2023-01-26 DIAGNOSIS — I480 Paroxysmal atrial fibrillation: Secondary | ICD-10-CM

## 2023-01-26 DIAGNOSIS — I2510 Atherosclerotic heart disease of native coronary artery without angina pectoris: Secondary | ICD-10-CM

## 2023-01-26 LAB — BASIC METABOLIC PANEL WITH GFR
BUN/Creatinine Ratio: 29 — ABNORMAL HIGH (ref 9–20)
BUN: 36 mg/dL — ABNORMAL HIGH (ref 6–24)
CO2: 27 mmol/L (ref 20–29)
Calcium: 9.6 mg/dL (ref 8.7–10.2)
Chloride: 97 mmol/L (ref 96–106)
Creatinine, Ser: 1.24 mg/dL (ref 0.76–1.27)
Glucose: 125 mg/dL — ABNORMAL HIGH (ref 70–99)
Potassium: 4 mmol/L (ref 3.5–5.2)
Sodium: 139 mmol/L (ref 134–144)
eGFR: 50 mL/min/{1.73_m2} — ABNORMAL LOW (ref >59)

## 2023-01-31 ENCOUNTER — Other Ambulatory Visit: Payer: Self-pay

## 2023-01-31 ENCOUNTER — Telehealth: Payer: Self-pay | Admitting: Internal Medicine

## 2023-01-31 ENCOUNTER — Telehealth: Payer: Self-pay | Admitting: *Deleted

## 2023-01-31 DIAGNOSIS — Z79899 Other long term (current) drug therapy: Secondary | ICD-10-CM

## 2023-01-31 MED ORDER — TORSEMIDE 20 MG PO TABS: 20.0000 mg | ORAL_TABLET | Freq: Two times a day (BID) | ORAL | Status: DC

## 2023-01-31 NOTE — Telephone Encounter (Signed)
Patient is calling back about results.

## 2023-01-31 NOTE — Telephone Encounter (Signed)
Returned pt's call. See other phone note

## 2023-01-31 NOTE — Telephone Encounter (Signed)
-----   Message from Nurse Corky Crafts sent at 01/31/2023 10:09 AM EDT -----  ----- Message ----- From: Kennon Rounds Sent: 01/26/2023   5:43 PM EDT To: [address] Triage  Results sent to [name] via MyChart. See MyChart comments below. PLAN:  -BMET 1 week -Pt is not supposed to be taking Spironolactone - make sure he is not taking and remove from list -Most recent med changes were for him to take Torsemide 20 mg twice daily - Med list still has 20 mg twice daily>>Please confirm how much Torsemide he is taking.  Mr. Honan  Your kidney function (creatinine) is stable.  Your potassium is normal.  Your BUN/creatinine ratio is somewhat elevated which could indicate mild dehydration.  It looks like you are now on Jardiance which is helping get the fluid off.  We may have to reduce your Torsemide dose. But for now, continue current medications.  I will recheck another lab test in 1 week. Tereso Newcomer, PA-C

## 2023-02-02 ENCOUNTER — Ambulatory Visit: Payer: Self-pay | Attending: Physician Assistant

## 2023-02-02 DIAGNOSIS — Z79899 Other long term (current) drug therapy: Secondary | ICD-10-CM

## 2023-02-03 LAB — BASIC METABOLIC PANEL
BUN/Creatinine Ratio: 27 — ABNORMAL HIGH (ref 9–20)
BUN: 32 mg/dL — ABNORMAL HIGH (ref 6–24)
CO2: 27 mmol/L (ref 20–29)
Calcium: 9.3 mg/dL (ref 8.7–10.2)
Chloride: 96 mmol/L (ref 96–106)
Creatinine, Ser: 1.2 mg/dL (ref 0.76–1.27)
Glucose: 131 mg/dL — ABNORMAL HIGH (ref 70–99)
Potassium: 3.7 mmol/L (ref 3.5–5.2)
Sodium: 138 mmol/L (ref 134–144)
eGFR: 70 mL/min/{1.73_m2} (ref >59)

## 2023-02-04 ENCOUNTER — Other Ambulatory Visit (HOSPITAL_COMMUNITY): Payer: Self-pay

## 2023-02-04 MED ORDER — EMPAGLIFLOZIN 10 MG PO TABS: 10.0000 mg | ORAL_TABLET | Freq: Every day | ORAL | Status: DC

## 2023-02-04 MED ORDER — EMPAGLIFLOZIN 10 MG PO TABS: 10.0000 mg | ORAL_TABLET | Freq: Every day | ORAL | 3 refills | Status: DC

## 2023-02-04 NOTE — Addendum Note (Signed)
Addended by: Burnetta Sabin on: 02/04/2023 01:07 PM   Modules accepted: Orders

## 2023-02-04 NOTE — Telephone Encounter (Signed)
Placed call to pt.  He has been made aware that we have received his application for PA for Jardiance and that we have already received an approval letter.  Per pt, he hasn't received any correspondence nor any medications.  I gave pt the 1800 # to call and see what was going on.   He will let us know if he needs further assistance.

## 2023-02-17 ENCOUNTER — Other Ambulatory Visit: Payer: Self-pay

## 2023-02-19 ENCOUNTER — Other Ambulatory Visit: Payer: Self-pay | Admitting: Cardiology

## 2023-02-20 ENCOUNTER — Other Ambulatory Visit (HOSPITAL_COMMUNITY): Payer: Self-pay

## 2023-02-21 ENCOUNTER — Other Ambulatory Visit (HOSPITAL_COMMUNITY): Payer: Self-pay

## 2023-02-21 ENCOUNTER — Other Ambulatory Visit: Payer: Self-pay

## 2023-02-21 MED ORDER — SILDENAFIL CITRATE 100 MG PO TABS
100.0000 mg | ORAL_TABLET | Freq: Every day | ORAL | 0 refills | Status: DC
  Filled 2023-02-21: qty 30, 30d supply, fill #0

## 2023-02-21 MED ORDER — TAMSULOSIN HCL 0.4 MG PO CAPS
0.4000 mg | ORAL_CAPSULE | Freq: Every day | ORAL | 0 refills | Status: DC
  Filled 2023-02-21: qty 30, 30d supply, fill #0

## 2023-03-18 ENCOUNTER — Other Ambulatory Visit (HOSPITAL_COMMUNITY): Payer: Self-pay

## 2023-03-19 ENCOUNTER — Other Ambulatory Visit (HOSPITAL_COMMUNITY): Payer: Self-pay

## 2023-03-19 ENCOUNTER — Other Ambulatory Visit: Payer: Self-pay | Admitting: Cardiology

## 2023-03-21 ENCOUNTER — Other Ambulatory Visit: Payer: Self-pay

## 2023-03-24 ENCOUNTER — Ambulatory Visit: Payer: Self-pay | Attending: Cardiology | Admitting: Cardiology

## 2023-03-24 ENCOUNTER — Encounter: Payer: Self-pay | Admitting: Cardiology

## 2023-03-24 VITALS — BP 122/72 | HR 60 | Ht 70.0 in | Wt 229.4 lb

## 2023-03-24 DIAGNOSIS — I480 Paroxysmal atrial fibrillation: Secondary | ICD-10-CM

## 2023-03-24 DIAGNOSIS — I5020 Unspecified systolic (congestive) heart failure: Secondary | ICD-10-CM

## 2023-03-24 DIAGNOSIS — I429 Cardiomyopathy, unspecified: Secondary | ICD-10-CM

## 2023-03-24 DIAGNOSIS — I4819 Other persistent atrial fibrillation: Secondary | ICD-10-CM

## 2023-03-24 DIAGNOSIS — D6869 Other thrombophilia: Secondary | ICD-10-CM

## 2023-03-24 NOTE — Progress Notes (Signed)
  Electrophysiology Office Note:   Date:  03/24/2023  ID:  Kyle Dawson, DOB Oct 01, 1965, MRN [id number]  Primary Cardiologist: Dietrich Pates, MD Electrophysiologist: Regan Lemming, MD      History of Present Illness:   Kyle Dawson is a 57 y.o. male with h/o coronary artery disease, chronic systolic heart failure, diabetes, hypertension, hyperlipidemia, alcohol abuse, atrial fibrillation post ablation 04/22/2021 seen today for routine electrophysiology followup.   He had admission to the hospital in [place] in July 2024 and was found to be taking both Multaq and sotalol.  These were discontinued.  He was readmitted to the hospital August 2024 with heart failure exacerbation and atypical chest pain.  Nonobstructive coronary artery disease was found on left heart catheterization.  Since last being seen in our clinic the patient reports doing well.  he denies chest pain, palpitations, dyspnea, PND, orthopnea, nausea, vomiting, dizziness, syncope, edema, weight gain, or early satiety.   Review of systems complete and found to be negative unless listed in HPI.   EP Information / Studies Reviewed:    EKG is ordered today. Personal review as below.  EKG Interpretation Date/Time:  Thursday March 24 2023 09:36:23 EST Ventricular Rate:  60 PR Interval:  148 QRS Duration:  84 QT Interval:  416 QTC Calculation: 416 R Axis:   44  Text Interpretation: Normal sinus rhythm Normal ECG When compared with ECG of 31-Dec-2022 09:55, No significant change was found Confirmed by Floree Zuniga ([id number]) on 03/24/2023 9:43:34 AM     Risk Assessment/Calculations:    CHA2DS2-VASc Score = 6   This indicates a 9.7% annual risk of stroke. The patient's score is based upon: CHF History: 1 HTN History: 1 Diabetes History: 1 Stroke History: 2 Vascular Disease History: 1 Age Score: 0 Gender Score: 0             Physical Exam:   VS:  BP 122/72 (BP Location: Left Arm, Patient Position:  Sitting, Cuff Size: Large)   Pulse 60   Ht 5\' 10"  (1.778 m)   Wt 229 lb 6.4 oz (104.1 kg)   SpO2 96%   BMI 32.92 kg/m    Wt Readings from Last 3 Encounters:  03/24/23 229 lb 6.4 oz (104.1 kg)  01/12/23 217 lb 6.4 oz (98.6 kg)  12/31/22 213 lb 6.4 oz (96.8 kg)     GEN: Well nourished, well developed in no acute distress NECK: No JVD; No carotid bruits CARDIAC: Regular rate and rhythm, no murmurs, rubs, gallops RESPIRATORY:  Clear to auscultation without rales, wheezing or rhonchi  ABDOMEN: Soft, non-tender, non-distended EXTREMITIES:  No edema; No deformity   ASSESSMENT AND PLAN:    1.  Persistent atrial fibrillation: He is in sinus rhythm.  He has had no further episodes of atrial fibrillation since ablation.  2.  Secondary hypercoagulable state: Currently on Eliquis for atrial fibrillation.  He is having trouble affording the medication.  He is taking it daily.  Duval Macleod give him information on financial assistance.  3.  Obesity: Lifestyle modification encouraged  4.  Coronary artery disease: No current angina.  5.  Hypertension: Currently well-controlled  6.  Chronic systolic heart failure: Plan for repeat echo per primary cardiology  7.  Alcohol abuse: Complete cessation encouraged  Follow up with Afib Clinic in 6 months  Signed, Demetra Moya Jorja Loa, MD

## 2023-03-24 NOTE — Patient Instructions (Signed)
Medication Instructions:  Your physician recommends that you continue on your current medications as directed. Please refer to the Current Medication list given to you today.  *If you need a refill on your cardiac medications before your next appointment, please call your pharmacy*   Lab Work: None ordered   Testing/Procedures: None ordered   Follow-Up: At [facility], you and your health needs are our priority.  As part of our continuing mission to provide you with exceptional heart care, we have created designated Provider Care Teams.  These Care Teams include your primary Cardiologist (physician) and Advanced Practice Providers (APPs -  Physician Assistants and Nurse Practitioners) who all work together to provide you with the care you need, when you need it.  Your next appointment:   6 month(s)  The format for your next appointment:   In Person  Provider:   You will follow up in the Atrial Fibrillation Clinic located at [facility]. Your provider will be: Clint R. Fenton, PA-C or Lake Bells, PA-C    Thank you for choosing [facility]!!   Dory Horn, RN [phone]

## 2023-03-29 ENCOUNTER — Telehealth: Payer: Self-pay

## 2023-03-29 ENCOUNTER — Other Ambulatory Visit (HOSPITAL_COMMUNITY): Payer: Self-pay

## 2023-03-29 ENCOUNTER — Telehealth: Payer: Self-pay | Admitting: Licensed Clinical Social Worker

## 2023-03-29 NOTE — Telephone Encounter (Signed)
-----   Message from Otho Najjar sent at 03/28/2023  2:27 PM EST ----- Regarding: FW: ? assistance  ----- Message ----- From: Baird Lyons, RN Sent: 03/24/2023  10:54 AM EST To: Rx Med Assistance Team Subject: ? assistance                                   Pt here today. States he has no insurance. I gave him Eliquis assistance paperwork today, but not sure what other options he may have or if another option, like Xarelto, would be cheaper (although I do not think it would matter between the 2 of these cost wise) Stated it took him almost 4 months to get his Jardiance after applying for assist.   Just sending this incase there might me other options for him. Can you reach out to pt if so.  Ala Dach

## 2023-03-29 NOTE — Telephone Encounter (Signed)
The [employer]MS application will be his best bet to cover cost on Eliquis if patient is uninsured.  Depending on his income [employer] may require he be denied for medicaid first before he will qualify for their PAP.  If patient is low income, he likely qualifies for Medicaid. Routing in social work as well.

## 2023-03-29 NOTE — Telephone Encounter (Signed)
H&V Care Navigation CSW Progress Note  Clinical Social Worker contacted patient by phone to f/u on uninsured status. Has applied for medication assistance, income documented on application would likely qualify him for Medicaid. No answer today at [phone], left voicemail requesting return call. Will re-attempt again as able.   Patient is participating in a Managed Medicaid Plan:  No, self pay only  SDOH Screenings   Depression (PHQ2-9): Low Risk  (12/05/2020)  Tobacco Use: Low Risk  (03/24/2023)   Kyle Dawson, MSW, LCSW Clinical Social Worker II [facility] Heart/Vascular Care Navigation  [phone]- work cell phone (preferred) [phone]- desk phone

## 2023-03-30 ENCOUNTER — Other Ambulatory Visit (HOSPITAL_COMMUNITY): Payer: Self-pay

## 2023-03-30 ENCOUNTER — Other Ambulatory Visit: Payer: Self-pay | Admitting: Cardiology

## 2023-03-30 ENCOUNTER — Telehealth: Payer: Self-pay | Admitting: Licensed Clinical Social Worker

## 2023-03-30 NOTE — Telephone Encounter (Signed)
H&V Care Navigation CSW Progress Note  Clinical Social Worker contacted patient again today, by phone to f/u on uninsured status. Has previously applied for medication assistance, income documented on application would likely qualify him for Medicaid. No answer today at [phone], left 2nd voicemail requesting return call. Will re-attempt again as able.    Patient is participating in a Managed Medicaid Plan:  No, self pay only  SDOH Screenings   Depression (PHQ2-9): Low Risk  (12/05/2020)  Tobacco Use: Low Risk  (03/24/2023)   Kyle Dawson, MSW, LCSW Clinical Social Worker II [facility] Health Heart/Vascular Care Navigation  [phone]- work cell phone (preferred) [phone]- desk phone

## 2023-03-31 ENCOUNTER — Telehealth: Payer: Self-pay | Admitting: Licensed Clinical Social Worker

## 2023-03-31 NOTE — Telephone Encounter (Signed)
Left detailed message on voicemial, dpr on file. Informed pt of medication assistance team response to this and that social worked may be reaching out to discuss as well. Advised to call back if he needed to discuss anything further about this.

## 2023-03-31 NOTE — Telephone Encounter (Signed)
Thank you so much Rutherford Nail!

## 2023-03-31 NOTE — Telephone Encounter (Signed)
H&V Care Navigation CSW Progress Note  Clinical Social Worker contacted patient by phone to f/u on uninsured status. Has previously applied for medication assistance, income documented on application would likely qualify him for Medicaid. No answer today at [phone], left 3rd voicemail requesting return call. I have mailed pt a copy of the Medicaid flyer, [employer] Assistance, and my card along with a note to call when received. Remain available should pt engage with care navigation team.  Patient is participating in a Managed Medicaid Plan:  No, self pay only  SDOH Screenings   Depression (PHQ2-9): Low Risk  (12/05/2020)  Tobacco Use: Low Risk  (03/24/2023)    Kyle Dawson, MSW, LCSW Clinical Social Worker II [facility] Heart/Vascular Care Navigation  [phone]- work cell phone (preferred) [phone]- desk phone

## 2023-04-05 ENCOUNTER — Other Ambulatory Visit: Payer: Self-pay | Admitting: Cardiology

## 2023-04-06 ENCOUNTER — Other Ambulatory Visit: Payer: Self-pay

## 2023-04-06 ENCOUNTER — Other Ambulatory Visit (HOSPITAL_COMMUNITY): Payer: Self-pay

## 2023-04-06 MED ORDER — TAMSULOSIN HCL 0.4 MG PO CAPS
0.4000 mg | ORAL_CAPSULE | Freq: Every day | ORAL | 3 refills | Status: DC
  Filled 2023-04-06: qty 30, 30d supply, fill #0
  Filled 2023-05-03: qty 30, 30d supply, fill #1
  Filled 2023-06-06: qty 30, 30d supply, fill #2
  Filled 2023-07-15: qty 30, 30d supply, fill #3

## 2023-04-11 ENCOUNTER — Telehealth: Payer: Self-pay | Admitting: Licensed Clinical Social Worker

## 2023-04-11 ENCOUNTER — Encounter: Payer: Self-pay | Admitting: Cardiology

## 2023-04-11 NOTE — Telephone Encounter (Signed)
We are kinda low on samples. What do you think about either doing free 30 day card of eliquis. he may have used already, so then we could use the free 30 day of Xarelto instead?

## 2023-04-12 NOTE — Telephone Encounter (Signed)
H&V Care Navigation CSW Progress Note  Clinical Social Worker contacted patient by phone to f/u on multiple calls to provide assistance. Pt reached back out to me today from [phone]. Introduced self, role, reason for call. Shared that his message about his [employer] application has been routed to me and I noted that his current income noted would put him as eligible for expansion Medicaid coverage. 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 I inquired if he will be receiving insurance benefits with that plan and he believes those should start 05/11/23. I shared that I believe the best course of action would be for pt to still go apply for Medicaid, if approved this would help provide a bridge until his income increases and then he can unenroll from plan. I also want him to apply for Medicaid b/c should he attempt to for medication assistance again in January if his coverage has not begun he will still need the Medicaid denial letter. Pt states understanding. In the meantime I will reach out to pharmacy and nursing teams to see if pt able to receive some samples.   Patient is participating in a Managed Medicaid Plan:  No, self pay only  SDOH Screenings   Depression (PHQ2-9): Low Risk  (12/05/2020)  Financial Resource Strain: Medium Risk (04/11/2023)  Tobacco Use: Low Risk  (03/24/2023)    Octavio Graves, MSW, LCSW Clinical Social Worker II [facility] Health Heart/Vascular Care Navigation  [phone]- work cell phone (preferred) [phone]- desk phone

## 2023-04-13 ENCOUNTER — Encounter (HOSPITAL_COMMUNITY): Payer: Self-pay | Admitting: Physician Assistant

## 2023-04-13 ENCOUNTER — Ambulatory Visit (HOSPITAL_COMMUNITY): Payer: Self-pay | Attending: Physician Assistant

## 2023-04-15 ENCOUNTER — Other Ambulatory Visit (HOSPITAL_COMMUNITY): Payer: Self-pay

## 2023-04-15 ENCOUNTER — Telehealth: Payer: Self-pay | Admitting: Cardiology

## 2023-04-15 ENCOUNTER — Other Ambulatory Visit: Payer: Self-pay

## 2023-04-15 MED ORDER — RIVAROXABAN 20 MG PO TABS
20.0000 mg | ORAL_TABLET | Freq: Every day | ORAL | 0 refills | Status: DC
  Filled 2023-04-15: qty 30, 30d supply, fill #0

## 2023-04-15 NOTE — Addendum Note (Signed)
Addended by: Malena Peer D on: 04/15/2023 12:27 PM   Modules accepted: Orders

## 2023-04-15 NOTE — Telephone Encounter (Signed)
Pt c/o medication issue:  1. Name of Medication:   rivaroxaban (XARELTO) 20 MG TABS tablet   2. How are you currently taking this medication (dosage and times per day)?   3. Are you having a reaction (difficulty breathing--STAT)?   4. What is your medication issue?   Caller Teacher, English as a foreign language) stated this medication will cost the patient $600 and they did not find any prescription discount for this patient.  Caller wants a call back to advise next steps.

## 2023-04-15 NOTE — Telephone Encounter (Signed)
Spoke to [facility] pharmacy about this.  Informed of information from 12/2 mychart message with Xarelto 30 day free information given to pharmacy who will get this filled for pt. Unfortunately I do not know who Herbert Seta is to forward this back to as requested.  Will forward to social worker & med assistance team in case they know of Herbert Seta to forward this to...Marland KitchenMarland Kitchen

## 2023-04-18 ENCOUNTER — Other Ambulatory Visit: Payer: Self-pay

## 2023-04-19 NOTE — Telephone Encounter (Signed)
Likely referring to our pharmacy tech in the call center, Darden Dates. I have forwarded the message to her to make her aware. Thanks Sherri!

## 2023-05-02 ENCOUNTER — Encounter: Payer: Self-pay | Admitting: Internal Medicine

## 2023-05-02 ENCOUNTER — Ambulatory Visit: Payer: Self-pay | Attending: Internal Medicine | Admitting: Internal Medicine

## 2023-05-02 VITALS — BP 110/68 | HR 62 | Ht 70.0 in | Wt 233.4 lb

## 2023-05-02 DIAGNOSIS — E7800 Pure hypercholesterolemia, unspecified: Secondary | ICD-10-CM

## 2023-05-02 DIAGNOSIS — I5020 Unspecified systolic (congestive) heart failure: Secondary | ICD-10-CM

## 2023-05-02 DIAGNOSIS — I480 Paroxysmal atrial fibrillation: Secondary | ICD-10-CM

## 2023-05-02 NOTE — Patient Instructions (Addendum)
Medication Instructions:  Your physician recommends that you continue on your current medications as directed. Please refer to the Current Medication list given to you today.  *If you need a refill on your cardiac medications before your next appointment, please call your pharmacy*  Lab Work: TODAY: Lipomed, Apo B, LP(a), Hgb A1c If you have labs (blood work) drawn today and your tests are completely normal, you will receive your results only by: MyChart Message (if you have MyChart) OR A paper copy in the mail If you have any lab test that is abnormal or we need to change your treatment, we will call you to review the results.  Testing/Procedures: Your physician has recommended you have an [facility] home sleep study performed. One of our staff members will call you after May 11, 2023 to setup sleep study.  Your physician has requested that you have an echocardiogram. Echocardiography is a painless test that uses sound waves to create images of your heart. It provides your doctor with information about the size and shape of your heart and how well your heart's chambers and valves are working. This procedure takes approximately one hour. There are no restrictions for this procedure. Please do NOT wear cologne, perfume, aftershave, or lotions (deodorant is allowed). Please arrive 15 minutes prior to your appointment time.  Please note: We ask at that you not bring children with you during ultrasound (echo/ vascular) testing. Due to room size and safety concerns, children are not allowed in the ultrasound rooms during exams. Our front office staff cannot provide observation of children in our lobby area while testing is being conducted. An adult accompanying a patient to their appointment will only be allowed in the ultrasound room at the discretion of the ultrasound technician under special circumstances. We apologize for any inconvenience.  Follow-Up: At [facility], you and your health needs  are our priority.  As part of our continuing mission to provide you with exceptional heart care, we have created designated Provider Care Teams.  These Care Teams include your primary Cardiologist (physician) and Advanced Practice Providers (APPs -  Physician Assistants and Nurse Practitioners) who all work together to provide you with the care you need, when you need it.  We recommend signing up for the patient portal called "MyChart".  Sign up information is provided on this After Visit Summary.  MyChart is used to connect with patients for Virtual Visits (Telemedicine).  Patients are able to view lab/test results, encounter notes, upcoming appointments, etc.  Non-urgent messages can be sent to your provider as well.   To learn more about what you can do with MyChart, go to [email or web address].    Your next appointment:   9 month(s)  The format for your next appointment:   In Person  Provider:   Dietrich Pates, MD {

## 2023-05-02 NOTE — Progress Notes (Unsigned)
Cardiology Office Note   Date:  05/02/2023   ID:  Kyle Dawson, DOB Jan 28, 1966, MRN [id number]  PCP:  Pcp, No  Cardiologist:   Dietrich Pates, MD   Pt presents for f/u of CAD and PAF      History of Present Illness: Kyle Dawson is a 57 y.o. male with a history of CAD (CCTA 50 to 75% LAD stenosis    Calcium score 838)  PAF, DM, HTN, HL, EtoH The pt is s/p Afib ablation in Dec 2022  Followed by Carleene Mains  In July he was admitted to hosp in [facility] with volume overload  Echo done, LVEF reported  40%  Pt admits to not taking meds though he was reported to be on Multaq and Sotalol  Both stopped In Aug he was admitted to [facility] for CP.  LHC showed nonobstructive CAD   LVEDP 17   The pt says he is currently feeling good  No CP  no SOB  No dizzness   No significant LE edema    Current Meds  Medication Sig   empagliflozin (JARDIANCE) 10 MG TABS tablet Take 1 tablet (10 mg total) by mouth daily before breakfast.   glipiZIDE (GLUCOTROL) 5 MG tablet Take 1 tablet by mouth 2  times daily before a meal.   losartan (COZAAR) 50 MG tablet Take 1 tablet (50 mg total) by mouth daily.   metoprolol succinate (TOPROL-XL) 50 MG 24 hr tablet Take 1 tablet (50 mg total) by mouth in the morning and at bedtime.   Multiple Vitamins-Minerals (MULTIVITAMIN WITH MINERALS) tablet Take 1 tablet by mouth daily.   OneTouch Delica Lancets 33G MISC USE TO CHECK BLOOD SUGAR ONCE DAILY   rivaroxaban (XARELTO) 20 MG TABS tablet Take 1 tablet (20 mg total) by mouth daily with supper.   sildenafil (VIAGRA) 100 MG tablet Take 1 tablet (100 mg total) by mouth daily as needed for erectile dysfunction   tamsulosin (FLOMAX) 0.4 MG CAPS capsule Take 1 capsule (0.4 mg total) by mouth daily after supper.     Allergies:   Patient has no known allergies.   Past Medical History:  Diagnosis Date   Basal cell carcinoma    Chest pain    Diabetes mellitus without complication (HCC)    Edema    lower extremity    Hypertension    Squamous cell carcinoma    R ear, nose, each side of face    Past Surgical History:  Procedure Laterality Date   ATRIAL FIBRILLATION ABLATION N/A 04/22/2021   Procedure: ATRIAL FIBRILLATION ABLATION;  Surgeon: Regan Lemming, MD;  Location: [facility] INVASIVE CV LAB;  Service: Cardiovascular;  Laterality: N/A;   Removal basal cell carcinoma     Removal squamous cell carcinoma     RIGHT/LEFT HEART CATH AND CORONARY ANGIOGRAPHY N/A 12/23/2022   Procedure: RIGHT/LEFT HEART CATH AND CORONARY ANGIOGRAPHY;  Surgeon: [name], MD;  Location: [facility] INVASIVE CV LAB;  Service: Cardiovascular;  Laterality: N/A;     Social History:  The patient  reports that he has never smoked. He has never used smokeless tobacco. He reports that he does not currently use alcohol after a past usage of about 3.0 - 6.0 standard drinks of alcohol per week. He reports that he does not use drugs.   Family History:  The patient's family history includes CAD in his maternal grandfather; Heart failure in his mother and paternal grandfather; Hypertension in his father and mother; Peripheral vascular disease (  age of onset: 21) in his mother.    ROS:  Please see the history of present illness. All other systems are reviewed and  Negative to the above problem except as noted.    PHYSICAL EXAM: VS:  BP 110/68   Pulse 62   Ht 5\' 10"  (1.778 m)   Wt 233 lb 6.4 oz (105.9 kg)   SpO2 98%   BMI 33.49 kg/m   ZOX:WRUEA 57 yo in no acute distress  HEENT: normal  Neck: no JVD, carotid bruit  Cardiac: RRR with no murmurs  No LE edema   Respiratory:  clear to auscultation  GI: soft, nontender No hepatomegaly  MS: no deformity     EKG:  EKG is not  ordered today.  R/L Heart cath   Aug 2024     Ost Cx to Prox Cx lesion is 20% stenosed.   Prox LAD lesion is 50% stenosed.   1.  Mild obstructive coronary artery disease that does not explain the patient's cardiomyopathy.  An FFR angiography assessment of the  LAD was performed with the results of 0.95.  Therefore intervention was deferred. 2.  Fick cardiac output of 6.7 L/min and Fick cardiac index of 3.2 L/min/m with the following hemodynamics:             Right atrial pressure mean of 10 mmHg             Right ventricular pressure 73/4 with an end-diastolic pressure of 19 mmHg             PA pressure 77/21 with a mean of 45 mmHg             Wedge pressure mean of 24 mmHg with V waves to 34 mmHg             PVR of 3.1 Woods units 3.  LVEDP of 17 mmHg\ 4.  Presence of left sided SVC.   Commendation: Augment diuresis.  The patient will receive IV torsemide here in the hospital and be discharged on 40 mg torsemide twice daily (from his home dose of 40 mg daily).  We will refer the patient to [facility] clinic for heart failure continuity of care.    ECHO  05/26/19  1. Left ventricular ejection fraction, by visual estimation, is 60 to 65%. The left ventricle has normal function. There is moderately increased left ventricular hypertrophy. 2. Left ventricular diastolic function could not be evaluated. 3. The left ventricle has no regional wall motion abnormalities. 4. Global right ventricle was not well visualized.The right ventricular size is not well visualized. Right vetricular wall thickness was not assessed. 5. Left atrial size was normal. 6. Right atrial size was normal. 7. The mitral valve is grossly normal. No evidence of mitral valve regurgitation. 8. The tricuspid valve is grossly normal. 9. The aortic valve was not well visualized. Aortic valve regurgitation is not visualized. 10. The pulmonic valve was grossly normal. Pulmonic valve regurgitation is not visualized. 11. 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 12. The inferior vena cava is normal in size with <50% respiratory variability, suggesting right atrial pressure of 8 mmHg. 13. The interatrial septum was not well visualized. Lipid  Panel    Component Value Date/Time   CHOL 190 04/16/2021 0755   TRIG 100 04/16/2021 0755   HDL 69 04/16/2021 0755   CHOLHDL 2.8 04/16/2021 0755   CHOLHDL 3.4 05/16/2020 0920   VLDL 28 05/26/2019 0434   LDLCALC  103 (H) 04/16/2021 0755   LDLCALC 105 (H) 05/16/2020 0920      Wt Readings from Last 3 Encounters:  05/02/23 233 lb 6.4 oz (105.9 kg)  03/24/23 229 lb 6.4 oz (104.1 kg)  01/12/23 217 lb 6.4 oz (98.6 kg)      ASSESSMENT AND PLAN:  1  Hx PAF  Pt with PAF   He is now status post AV ablation   Overall doing well  No recurrence clinically   2  CAD  Pt had CCTA  As noted above Also had LHC in Aug 2024 which showed nonobstructive CAD   3 HFrEF  Pt hospitalized in July 2024 in [facility]    Echo then showed LVEF 40%  Would repeat echo now   4  HTN  Well controlled   5  HL   Need to get lipids today   6  DM  A1C in Aug 2024 was 7.3    Recheck A1C   Low carb diet  Follow up in September 2025  Current medicines are reviewed at length with the patient today.  The patient does not have concerns regarding medicines.  Signed, Dietrich Pates, MD  05/02/2023 2:16 PM    [facility] [address] [place], [place], [place]  [address] Phone: [phone]; Fax: [phone]

## 2023-05-03 ENCOUNTER — Other Ambulatory Visit (HOSPITAL_COMMUNITY): Payer: Self-pay

## 2023-05-05 ENCOUNTER — Other Ambulatory Visit: Payer: Self-pay

## 2023-05-05 MED ORDER — TORSEMIDE 20 MG PO TABS: 20.0000 mg | ORAL_TABLET | Freq: Two times a day (BID) | ORAL | 3 refills | Status: DC

## 2023-05-06 ENCOUNTER — Other Ambulatory Visit (HOSPITAL_COMMUNITY): Payer: Self-pay

## 2023-05-06 ENCOUNTER — Other Ambulatory Visit: Payer: Self-pay | Admitting: Internal Medicine

## 2023-05-10 ENCOUNTER — Other Ambulatory Visit: Payer: Self-pay | Admitting: Internal Medicine

## 2023-05-10 ENCOUNTER — Other Ambulatory Visit (HOSPITAL_COMMUNITY): Payer: Self-pay | Admitting: Cardiology

## 2023-05-10 ENCOUNTER — Other Ambulatory Visit: Payer: Self-pay

## 2023-05-10 MED ORDER — TORSEMIDE 20 MG PO TABS
20.0000 mg | ORAL_TABLET | Freq: Two times a day (BID) | ORAL | 3 refills | Status: DC
  Filled 2023-05-10 – 2023-05-12 (×2): qty 60, 30d supply, fill #0
  Filled 2023-06-06: qty 60, 30d supply, fill #1

## 2023-05-10 NOTE — Telephone Encounter (Signed)
Pharmacy called to request updated RX Current RX is not active Multiple RX's on file  -script sent

## 2023-05-12 ENCOUNTER — Other Ambulatory Visit: Payer: Self-pay

## 2023-05-18 ENCOUNTER — Other Ambulatory Visit: Payer: Self-pay | Admitting: Cardiology

## 2023-05-18 ENCOUNTER — Other Ambulatory Visit (HOSPITAL_COMMUNITY): Payer: Self-pay

## 2023-05-18 DIAGNOSIS — I480 Paroxysmal atrial fibrillation: Secondary | ICD-10-CM

## 2023-05-18 MED ORDER — APIXABAN 5 MG PO TABS
5.0000 mg | ORAL_TABLET | Freq: Two times a day (BID) | ORAL | 6 refills | Status: DC
  Filled 2023-05-18 – 2023-06-01 (×2): qty 60, 30d supply, fill #0
  Filled 2023-06-15: qty 60, 30d supply, fill #1
  Filled 2023-07-31: qty 60, 30d supply, fill #2
  Filled 2023-08-26: qty 60, 30d supply, fill #3
  Filled 2023-10-01: qty 60, 30d supply, fill #4
  Filled 2023-10-31: qty 60, 30d supply, fill #5
  Filled 2023-11-26: qty 60, 30d supply, fill #6

## 2023-05-18 NOTE — Telephone Encounter (Signed)
 Eliquis  5mg  refill request received. Patient is 58 years old, weight-105.9kg, Crea-1.20 on 02/02/23, Diagnosis-Afib, and last seen by on Dr. Okey on 05/02/23. Pt was switched to Xarelto  from Eliquis  and last sent 04/15/23, per last patient message on 04/11/23 it states I will call you in a prescription for Xarelto  to replace the Eliquis  temporarily and in the latter it states-Once you get insurance, we can switch you back to Eliquis . I dont have enough samples of Eliquis  to get you to January so this is the best solution.    Called pt to inquire which med he is currently on and he states he is on xarelto  was supposed to be switched temporarily until he obtained insurance then go back to eliquis . He states he does have insurance now and he can start his eliquis  back as discussed. He asked for prescription to be sent to [facility] as requested.

## 2023-05-24 ENCOUNTER — Other Ambulatory Visit (HOSPITAL_COMMUNITY): Payer: Self-pay

## 2023-05-25 ENCOUNTER — Encounter: Payer: Self-pay | Admitting: Cardiology

## 2023-05-27 ENCOUNTER — Other Ambulatory Visit (HOSPITAL_COMMUNITY): Payer: Self-pay

## 2023-05-30 ENCOUNTER — Other Ambulatory Visit: Payer: Self-pay

## 2023-05-31 ENCOUNTER — Other Ambulatory Visit: Payer: Self-pay

## 2023-05-31 ENCOUNTER — Telehealth: Payer: Self-pay | Admitting: Pharmacy Technician

## 2023-05-31 ENCOUNTER — Other Ambulatory Visit (HOSPITAL_COMMUNITY): Payer: Self-pay

## 2023-05-31 ENCOUNTER — Encounter: Payer: Self-pay | Admitting: Pharmacist

## 2023-05-31 NOTE — Telephone Encounter (Signed)
Pharmacy Patient Advocate Encounter   Received notification from Pt Calls Messages that prior authorization for eliquis is required/requested.   Insurance verification completed.   The patient is insured through [employer] .   Per test claim: PA required; PA submitted to above mentioned insurance via CoverMyMeds Key/confirmation #/EOC [id number] Status is pending

## 2023-05-31 NOTE — Telephone Encounter (Signed)
Pharmacy Patient Advocate Encounter  Received notification from [employer] that Prior Authorization for eliquis has been APPROVED from 05/31/23 to 05/30/24. Ran test claim, Copay is $20.00 one month. This test claim was processed through [facility]- copay amounts may vary at other pharmacies due to pharmacy/plan contracts, or as the patient moves through the different stages of their insurance plan.   PA #/Case ID/Reference #: [id number]

## 2023-06-01 ENCOUNTER — Other Ambulatory Visit: Payer: Self-pay

## 2023-06-01 ENCOUNTER — Other Ambulatory Visit (HOSPITAL_COMMUNITY): Payer: Self-pay

## 2023-06-02 ENCOUNTER — Other Ambulatory Visit (HOSPITAL_COMMUNITY): Payer: Self-pay

## 2023-06-02 ENCOUNTER — Ambulatory Visit (HOSPITAL_COMMUNITY): Payer: Managed Care, Other (non HMO)

## 2023-06-06 ENCOUNTER — Other Ambulatory Visit: Payer: Self-pay

## 2023-06-06 ENCOUNTER — Other Ambulatory Visit: Payer: Self-pay | Admitting: Cardiology

## 2023-06-06 ENCOUNTER — Other Ambulatory Visit (HOSPITAL_COMMUNITY): Payer: Self-pay

## 2023-06-08 ENCOUNTER — Telehealth: Payer: Self-pay

## 2023-06-08 ENCOUNTER — Other Ambulatory Visit (HOSPITAL_BASED_OUTPATIENT_CLINIC_OR_DEPARTMENT_OTHER): Payer: Self-pay

## 2023-06-08 ENCOUNTER — Other Ambulatory Visit (HOSPITAL_COMMUNITY): Payer: Self-pay

## 2023-06-08 DIAGNOSIS — I480 Paroxysmal atrial fibrillation: Secondary | ICD-10-CM

## 2023-06-08 DIAGNOSIS — I5020 Unspecified systolic (congestive) heart failure: Secondary | ICD-10-CM

## 2023-06-08 DIAGNOSIS — Z79899 Other long term (current) drug therapy: Secondary | ICD-10-CM

## 2023-06-08 DIAGNOSIS — I4819 Other persistent atrial fibrillation: Secondary | ICD-10-CM

## 2023-06-08 DIAGNOSIS — E7800 Pure hypercholesterolemia, unspecified: Secondary | ICD-10-CM

## 2023-06-08 DIAGNOSIS — I429 Cardiomyopathy, unspecified: Secondary | ICD-10-CM

## 2023-06-08 LAB — NMR, LIPOPROFILE
Cholesterol, Total: 168 mg/dL (ref 100–199)
HDL Particle Number: 28.8 umol/L — ABNORMAL LOW (ref >=30.5)
HDL-C: 32 mg/dL — ABNORMAL LOW (ref >39)
LDL Particle Number: 1193 nmol/L — ABNORMAL HIGH (ref <1000)
LDL Size: 19.9 nmol — ABNORMAL LOW (ref >20.5)
LDL-C (NIH Calc): 84 mg/dL (ref 0–99)
LP-IR Score: 82 — ABNORMAL HIGH (ref <=45)
Small LDL Particle Number: 830 nmol/L — ABNORMAL HIGH (ref <=527)
Triglycerides: 317 mg/dL — ABNORMAL HIGH (ref 0–149)

## 2023-06-08 LAB — HEMOGLOBIN A1C
Est. average glucose Bld gHb Est-mCnc: 154 mg/dL
Hgb A1c MFr Bld: 7 % — ABNORMAL HIGH (ref 4.8–5.6)

## 2023-06-08 LAB — APOLIPOPROTEIN B: Apolipoprotein B: 84 mg/dL (ref <90)

## 2023-06-08 LAB — LIPOPROTEIN A (LPA)

## 2023-06-08 MED ORDER — ROSUVASTATIN CALCIUM 10 MG PO TABS
10.0000 mg | ORAL_TABLET | Freq: Every day | ORAL | 3 refills | Status: AC
  Filled 2023-06-08: qty 90, 90d supply, fill #0
  Filled 2023-09-03: qty 90, 90d supply, fill #1
  Filled 2024-03-11: qty 90, 90d supply, fill #2

## 2023-06-08 NOTE — Telephone Encounter (Signed)
Left a message for the pt to call back.

## 2023-06-08 NOTE — Addendum Note (Signed)
Addended by: Bertram Millard on: 06/08/2023 03:17 PM   Modules accepted: Orders

## 2023-06-08 NOTE — Telephone Encounter (Signed)
-----   Message from [place] sent at 06/07/2023 10:19 PM EST ----- LDL is 84  with higher partlcle number and lower LDL size Triglycerides are high,   Reflect high sugars (A1C up at 7) Need to get better control of sugars It does not appear that he is on a statin  Please confirm  I would add Crestor 10 mg    Follow up lipomed panel and liver panel in 8 wks

## 2023-06-08 NOTE — Telephone Encounter (Signed)
Pt advised his lab results and will have redrawn end of March 2025. Pt has not been taking any meds and will start on the Crestor 10 mg daily.

## 2023-06-15 ENCOUNTER — Other Ambulatory Visit: Payer: Self-pay | Admitting: Cardiology

## 2023-06-15 ENCOUNTER — Other Ambulatory Visit: Payer: Self-pay

## 2023-06-15 ENCOUNTER — Other Ambulatory Visit (HOSPITAL_COMMUNITY): Payer: Self-pay

## 2023-06-18 ENCOUNTER — Other Ambulatory Visit: Payer: Self-pay | Admitting: Cardiology

## 2023-06-18 ENCOUNTER — Other Ambulatory Visit (HOSPITAL_COMMUNITY): Payer: Self-pay

## 2023-06-20 ENCOUNTER — Ambulatory Visit (HOSPITAL_COMMUNITY): Payer: Managed Care, Other (non HMO) | Attending: Cardiovascular Disease

## 2023-06-20 ENCOUNTER — Other Ambulatory Visit (HOSPITAL_COMMUNITY): Payer: Self-pay

## 2023-06-20 ENCOUNTER — Other Ambulatory Visit: Payer: Self-pay

## 2023-06-20 DIAGNOSIS — I5020 Unspecified systolic (congestive) heart failure: Secondary | ICD-10-CM | POA: Diagnosis present

## 2023-06-20 DIAGNOSIS — I358 Other nonrheumatic aortic valve disorders: Secondary | ICD-10-CM

## 2023-06-20 LAB — ECHOCARDIOGRAM COMPLETE
Area-P 1/2: 3.53 cm2
S' Lateral: 2.8 cm

## 2023-06-20 MED ORDER — EMPAGLIFLOZIN 10 MG PO TABS
10.0000 mg | ORAL_TABLET | Freq: Every day | ORAL | 3 refills | Status: AC
  Filled 2023-06-20: qty 30, 30d supply, fill #0
  Filled 2023-06-23: qty 90, 90d supply, fill #0
  Filled 2023-10-01: qty 90, 90d supply, fill #1
  Filled 2024-02-29: qty 90, 90d supply, fill #2

## 2023-06-20 NOTE — Telephone Encounter (Signed)
 Pt's pharmacy is requesting a refill on sildenafil . Would Dr. Avanell Bob like to refill this non cardiac medication? Please address

## 2023-06-21 ENCOUNTER — Other Ambulatory Visit: Payer: Self-pay

## 2023-06-21 ENCOUNTER — Encounter: Payer: Self-pay | Admitting: Cardiovascular Disease

## 2023-06-21 ENCOUNTER — Other Ambulatory Visit (HOSPITAL_COMMUNITY): Payer: Self-pay

## 2023-06-21 MED ORDER — SILDENAFIL CITRATE 100 MG PO TABS
100.0000 mg | ORAL_TABLET | Freq: Every day | ORAL | 0 refills | Status: DC
  Filled 2023-06-21: qty 30, 30d supply, fill #0

## 2023-06-22 ENCOUNTER — Telehealth: Payer: Self-pay

## 2023-06-22 DIAGNOSIS — I480 Paroxysmal atrial fibrillation: Secondary | ICD-10-CM

## 2023-06-22 DIAGNOSIS — Z79899 Other long term (current) drug therapy: Secondary | ICD-10-CM

## 2023-06-22 DIAGNOSIS — I10 Essential (primary) hypertension: Secondary | ICD-10-CM

## 2023-06-22 DIAGNOSIS — E7800 Pure hypercholesterolemia, unspecified: Secondary | ICD-10-CM

## 2023-06-22 DIAGNOSIS — I429 Cardiomyopathy, unspecified: Secondary | ICD-10-CM

## 2023-06-22 DIAGNOSIS — I4819 Other persistent atrial fibrillation: Secondary | ICD-10-CM

## 2023-06-22 DIAGNOSIS — I5022 Chronic systolic (congestive) heart failure: Secondary | ICD-10-CM

## 2023-06-22 DIAGNOSIS — I5020 Unspecified systolic (congestive) heart failure: Secondary | ICD-10-CM

## 2023-06-22 NOTE — Telephone Encounter (Signed)
I called the pt about his Echo and he reports that his BP has been good at 120/70... 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 he has been urinating a lot that he has to wear an "adult diaper" and travel with a urinal. He is up twice a night having to urinate... he denies other symptoms to suggest UTI since he is also on Jardiance.   He is taking Torsemide 20 mg BID.   I will send to Dr Tenny Craw for review when she returns for any recommendations.

## 2023-06-22 NOTE — Telephone Encounter (Signed)
-----   Message from Nurse Rudene Anda sent at 06/21/2023  9:38 AM EST -----  ----- Message ----- From: Vesta Mixer, MD Sent: 06/21/2023   8:48 AM EST To: Lars Mage, RN  Normal LV systolic function,   grade II DD Trivial MR  Borderline dilatation of aortic root measuring 39 mm Mild TR  Continue current meds , current plans

## 2023-06-23 ENCOUNTER — Other Ambulatory Visit (HOSPITAL_COMMUNITY): Payer: Self-pay

## 2023-06-23 ENCOUNTER — Other Ambulatory Visit: Payer: Self-pay

## 2023-06-23 ENCOUNTER — Telehealth: Payer: Self-pay

## 2023-06-23 NOTE — Telephone Encounter (Signed)
Pharmacy Patient Advocate Encounter   Received notification from CoverMyMeds that prior authorization for JARDIANCE is required/requested.   Insurance verification completed.   The patient is insured through [employer] .   Per test claim: PA required; PA submitted to above mentioned insurance via CoverMyMeds Key/confirmation #/EOC [id number] Status is pending

## 2023-06-23 NOTE — Telephone Encounter (Signed)
Pharmacy Patient Advocate Encounter  Received notification from [employer] that Prior Authorization for JARDIANCE has been APPROVED from 06/23/23 to 05/09/98

## 2023-07-02 ENCOUNTER — Other Ambulatory Visit (HOSPITAL_COMMUNITY): Payer: Self-pay

## 2023-07-02 NOTE — Telephone Encounter (Signed)
 I saw him in Dec   Echo done 06/20/23 showed LVEF was normall When seen in Dec 2024, volume status was good     He can try backing down on torsemide to daily 20 mg         Keep on other meds Limit salt (2.5-3 grams per day)   Limit processed, fast foods     Optimial diet:  Mediterranean diet   Lots of vegetables, low carb May be able to back off on meds more if does OK  Get BMET and BNP and A1C in a 10 to 14 days

## 2023-07-05 MED ORDER — TORSEMIDE 20 MG PO TABS: 20.0000 mg | ORAL_TABLET | Freq: Every day | ORAL | Status: DC

## 2023-07-05 NOTE — Telephone Encounter (Signed)
 Left a message for the pt to call back and will send to him also via My Chart.

## 2023-07-05 NOTE — Telephone Encounter (Signed)
 Pt advised and will follow up with him after his next labs.

## 2023-07-05 NOTE — Addendum Note (Signed)
 Addended by: Bertram Millard on: 07/05/2023 03:58 PM   Modules accepted: Orders

## 2023-07-15 ENCOUNTER — Other Ambulatory Visit (HOSPITAL_COMMUNITY): Payer: Self-pay

## 2023-07-15 ENCOUNTER — Other Ambulatory Visit: Payer: Self-pay | Admitting: Endocrinology

## 2023-07-15 DIAGNOSIS — E1165 Type 2 diabetes mellitus with hyperglycemia: Secondary | ICD-10-CM

## 2023-07-18 ENCOUNTER — Other Ambulatory Visit (HOSPITAL_COMMUNITY): Payer: Self-pay

## 2023-07-18 ENCOUNTER — Other Ambulatory Visit: Payer: Self-pay

## 2023-07-18 MED ORDER — GLIPIZIDE 5 MG PO TABS
5.0000 mg | ORAL_TABLET | Freq: Two times a day (BID) | ORAL | 3 refills | Status: DC
  Filled 2023-07-18: qty 90, 45d supply, fill #0
  Filled 2023-08-27: qty 90, 45d supply, fill #1
  Filled 2023-10-15: qty 90, 45d supply, fill #2
  Filled 2023-12-02: qty 90, 45d supply, fill #3

## 2023-07-19 ENCOUNTER — Other Ambulatory Visit (HOSPITAL_COMMUNITY): Payer: Self-pay

## 2023-07-19 ENCOUNTER — Other Ambulatory Visit: Payer: Self-pay | Admitting: Internal Medicine

## 2023-07-20 ENCOUNTER — Other Ambulatory Visit: Payer: Self-pay

## 2023-07-20 ENCOUNTER — Other Ambulatory Visit (HOSPITAL_COMMUNITY): Payer: Self-pay

## 2023-07-20 MED ORDER — TORSEMIDE 20 MG PO TABS
20.0000 mg | ORAL_TABLET | Freq: Every day | ORAL | 2 refills | Status: DC
  Filled 2023-07-20: qty 90, 90d supply, fill #0
  Filled 2023-10-15: qty 90, 90d supply, fill #1
  Filled 2024-01-13: qty 90, 90d supply, fill #2

## 2023-07-31 ENCOUNTER — Other Ambulatory Visit (HOSPITAL_COMMUNITY): Payer: Self-pay

## 2023-08-02 ENCOUNTER — Telehealth: Payer: Self-pay

## 2023-08-02 NOTE — Telephone Encounter (Signed)
 Please advise holding Eliquis prior to deep dental cleaning with scale/root planing.   Thank you!  DW

## 2023-08-02 NOTE — Telephone Encounter (Signed)
   Pre-operative Risk Assessment    Patient Name: Kyle Dawson  DOB: February 15, 1966 MRN: [id number]   Date of last office visit: 05/02/23 Dietrich Pates, MD Date of next office visit: NONE  Request for Surgical Clearance    Procedure:   DEEP CLEANING ( SCALE/ ROOT PLANING)  Date of Surgery:  Clearance TBD                                Surgeon:  April Holding, DDS, MS Surgeon's Group or Practice Name:  [facility] Phone number:  [phone] Fax number:  [phone]   Type of Clearance Requested:   - Medical  - Pharmacy:  Hold Apixaban (Eliquis)     Type of Anesthesia:  Not Indicated   Additional requests/questions:    Signed, Marlow Baars   08/02/2023, 1:34 PM

## 2023-08-03 ENCOUNTER — Telehealth: Payer: Self-pay | Admitting: *Deleted

## 2023-08-03 NOTE — Telephone Encounter (Signed)
 Sent information to Dr. Alma Friendly via Epic. Will remove from pre op pool. Doesn't look like anything else is needed.

## 2023-08-03 NOTE — Telephone Encounter (Signed)
 Patient with diagnosis of Afib on Eliquis for anticoagulation.    Procedure: DEEP CLEANING ( SCALE/ ROOT PLANING)   Date of procedure: TBD   CHA2DS2-VASc Score = 6   This indicates a 9.7% annual risk of stroke. The patient's score is based upon: CHF History: 1 HTN History: 1 Diabetes History: 1 Stroke History: 2 Vascular Disease History: 1 Age Score: 0 Gender Score: 0        **This guidance is not considered finalized until pre-operative APP has relayed final recommendations.**

## 2023-08-26 ENCOUNTER — Other Ambulatory Visit: Payer: Self-pay

## 2023-08-27 ENCOUNTER — Other Ambulatory Visit (HOSPITAL_COMMUNITY): Payer: Self-pay

## 2023-09-03 ENCOUNTER — Other Ambulatory Visit: Payer: Self-pay | Admitting: Internal Medicine

## 2023-09-05 ENCOUNTER — Other Ambulatory Visit (HOSPITAL_COMMUNITY): Payer: Self-pay

## 2023-09-05 ENCOUNTER — Other Ambulatory Visit: Payer: Self-pay

## 2023-09-06 ENCOUNTER — Other Ambulatory Visit: Payer: Self-pay | Admitting: Cardiology

## 2023-09-07 ENCOUNTER — Other Ambulatory Visit (HOSPITAL_COMMUNITY): Payer: Self-pay

## 2023-09-07 MED ORDER — SILDENAFIL CITRATE 100 MG PO TABS
100.0000 mg | ORAL_TABLET | Freq: Every day | ORAL | 1 refills | Status: AC
  Filled 2023-09-07: qty 30, 30d supply, fill #0
  Filled 2023-12-02: qty 8, 30d supply, fill #1
  Filled 2024-01-30: qty 8, 30d supply, fill #2
  Filled 2024-05-27: qty 8, 30d supply, fill #3

## 2023-09-07 NOTE — Telephone Encounter (Signed)
 Dr. Luanne Runner pt. As this is not a Cardiac RX, does Dr. Avanell Bob want to refill? Please advise

## 2023-09-08 ENCOUNTER — Other Ambulatory Visit: Payer: Self-pay

## 2023-09-08 ENCOUNTER — Other Ambulatory Visit (HOSPITAL_COMMUNITY): Payer: Self-pay

## 2023-09-08 MED ORDER — TAMSULOSIN HCL 0.4 MG PO CAPS
0.4000 mg | ORAL_CAPSULE | Freq: Every day | ORAL | 1 refills | Status: DC
  Filled 2023-09-08: qty 90, 90d supply, fill #0
  Filled 2024-01-30: qty 90, 90d supply, fill #1

## 2023-09-08 NOTE — Telephone Encounter (Signed)
 Dr. Luanne Runner pt's pharmacy is requesting a refill. As this is a Cardiac RX, does Dr. Avanell Bob want to refill? Please advise

## 2023-09-12 ENCOUNTER — Other Ambulatory Visit (HOSPITAL_COMMUNITY): Payer: Self-pay

## 2023-09-13 ENCOUNTER — Other Ambulatory Visit: Payer: Self-pay | Admitting: Cardiology

## 2023-09-13 ENCOUNTER — Other Ambulatory Visit (HOSPITAL_COMMUNITY): Payer: Self-pay

## 2023-09-14 ENCOUNTER — Other Ambulatory Visit: Payer: Self-pay | Admitting: Cardiology

## 2023-09-14 ENCOUNTER — Other Ambulatory Visit (HOSPITAL_COMMUNITY): Payer: Self-pay

## 2023-09-14 MED ORDER — LOSARTAN POTASSIUM 50 MG PO TABS
50.0000 mg | ORAL_TABLET | Freq: Every day | ORAL | 7 refills | Status: AC
  Filled 2023-09-14 – 2023-12-15 (×2): qty 30, 30d supply, fill #0
  Filled 2024-01-13: qty 30, 30d supply, fill #1
  Filled 2024-02-10: qty 30, 30d supply, fill #2
  Filled 2024-03-11: qty 30, 30d supply, fill #3
  Filled 2024-05-01 (×2): qty 30, 30d supply, fill #4
  Filled 2024-05-27: qty 30, 30d supply, fill #5

## 2023-10-01 ENCOUNTER — Other Ambulatory Visit (HOSPITAL_COMMUNITY): Payer: Self-pay

## 2023-10-06 ENCOUNTER — Ambulatory Visit: Admitting: Family Medicine

## 2023-10-06 ENCOUNTER — Other Ambulatory Visit (HOSPITAL_COMMUNITY)
Admission: RE | Admit: 2023-10-06 | Discharge: 2023-10-06 | Disposition: A | Discharge status: Home / Self Care | Source: Ambulatory Visit | Attending: Family Medicine | Admitting: Family Medicine

## 2023-10-06 ENCOUNTER — Encounter: Payer: Self-pay | Admitting: Family Medicine

## 2023-10-06 VITALS — BP 130/81 | HR 61 | Ht 70.0 in | Wt 245.6 lb

## 2023-10-06 DIAGNOSIS — E1169 Type 2 diabetes mellitus with other specified complication: Secondary | ICD-10-CM | POA: Diagnosis not present

## 2023-10-06 DIAGNOSIS — E119 Type 2 diabetes mellitus without complications: Secondary | ICD-10-CM

## 2023-10-06 DIAGNOSIS — F1011 Alcohol abuse, in remission: Secondary | ICD-10-CM

## 2023-10-06 DIAGNOSIS — Z113 Encounter for screening for infections with a predominantly sexual mode of transmission: Secondary | ICD-10-CM | POA: Diagnosis present

## 2023-10-06 DIAGNOSIS — Z8679 Personal history of other diseases of the circulatory system: Secondary | ICD-10-CM

## 2023-10-06 DIAGNOSIS — I10 Essential (primary) hypertension: Secondary | ICD-10-CM | POA: Diagnosis not present

## 2023-10-06 DIAGNOSIS — Z7984 Long term (current) use of oral hypoglycemic drugs: Secondary | ICD-10-CM | POA: Diagnosis not present

## 2023-10-06 DIAGNOSIS — N529 Male erectile dysfunction, unspecified: Secondary | ICD-10-CM

## 2023-10-06 DIAGNOSIS — Z6835 Body mass index (BMI) 35.0-35.9, adult: Secondary | ICD-10-CM

## 2023-10-06 DIAGNOSIS — E7800 Pure hypercholesterolemia, unspecified: Secondary | ICD-10-CM

## 2023-10-06 DIAGNOSIS — Z7689 Persons encountering health services in other specified circumstances: Secondary | ICD-10-CM

## 2023-10-06 DIAGNOSIS — Z85828 Personal history of other malignant neoplasm of skin: Secondary | ICD-10-CM

## 2023-10-06 DIAGNOSIS — E66812 Obesity, class 2: Secondary | ICD-10-CM

## 2023-10-06 NOTE — Addendum Note (Signed)
 Addended by: Ardelia Kohut on: 10/06/2023 04:46 PM   Modules accepted: Orders

## 2023-10-06 NOTE — Progress Notes (Signed)
 New Patient Office Visit  Subjective    Patient ID: Kyle Dawson, male    DOB: 11-30-1965  Age: 58 y.o. MRN: [id number]  CC:  Chief Complaint  Patient presents with   New Patient (Initial Visit)    HPI Kyle Dawson presents to establish care and for review of chronic med issues including diabetes and hypertension. Patient reports med compliance and denies acute complaints.    Outpatient Encounter Medications as of 10/06/2023  Medication Sig   apixaban  (ELIQUIS ) 5 MG TABS tablet Take 1 tablet (5 mg) by mouth 2 times daily.   cholecalciferol (VITAMIN D3) 25 MCG (1000 UNIT) tablet Take 1,000 Units by mouth daily.   empagliflozin  (JARDIANCE ) 10 MG TABS tablet Take 1 tablet (10 mg total) by mouth daily before breakfast.   glipiZIDE  (GLUCOTROL ) 5 MG tablet Take 1 tablet by mouth 2  times daily before a meal.   losartan  (COZAAR ) 50 MG tablet Take 1 tablet (50 mg total) by mouth daily.   metoprolol  succinate (TOPROL -XL) 50 MG 24 hr tablet Take 1 tablet (50 mg total) by mouth in the morning and at bedtime.   Multiple Vitamins-Minerals (MULTIVITAMIN WITH MINERALS) tablet Take 1 tablet by mouth daily.   OneTouch Delica Lancets 33G MISC USE TO CHECK BLOOD SUGAR ONCE DAILY   rosuvastatin  (CRESTOR ) 10 MG tablet Take 1 tablet (10 mg total) by mouth daily.   tamsulosin  (FLOMAX ) 0.4 MG CAPS capsule Take 1 capsule (0.4 mg total) by mouth daily after supper.   torsemide  (DEMADEX ) 20 MG tablet Take 1 tablet (20 mg total) by mouth daily.   sildenafil  (VIAGRA ) 100 MG tablet Take 1 tablet (100 mg total) by mouth daily as needed for erectile dysfunction (Patient not taking: Reported on 10/06/2023)   No facility-administered encounter medications on file as of 10/06/2023.    Past Medical History:  Diagnosis Date   Basal cell carcinoma    Chest pain    Diabetes mellitus without complication (HCC)    Edema    lower extremity   Hypertension    Squamous cell carcinoma    R ear, nose, each side  of face    Past Surgical History:  Procedure Laterality Date   ATRIAL FIBRILLATION ABLATION N/A 04/22/2021   Procedure: ATRIAL FIBRILLATION ABLATION;  Surgeon: Lei Pump, MD;  Location: [facility];  Service: Cardiovascular;  Laterality: N/A;   Removal basal cell carcinoma     Removal squamous cell carcinoma     RIGHT/LEFT HEART CATH AND CORONARY ANGIOGRAPHY N/A 12/23/2022   Procedure: RIGHT/LEFT HEART CATH AND CORONARY ANGIOGRAPHY;  Surgeon: Kyra Phy, MD;  Location: [facility];  Service: Cardiovascular;  Laterality: N/A;    Family History  Problem Relation Age of Onset   Peripheral vascular disease Mother 84   Hypertension Mother    Heart failure Mother    CAD Maternal Grandfather    Heart failure Paternal Grandfather    Hypertension Father     Social History   Socioeconomic History   Marital status: Single    Spouse name: Not on file   Number of children: 0   Years of education: ADN   Highest education level: Associate degree: academic program  Occupational History   Occupation: ER [employer]    Employer: [employer]   Occupation: [employer]  Tobacco Use   Smoking status: Never   Smokeless tobacco: Never   Tobacco comments:    Never smoked 12/27/22  Vaping Use   Vaping  status: Never Used  Substance and Sexual Activity   Alcohol use: Not Currently    Alcohol/week: 3.0 - 6.0 standard drinks of alcohol    Types: 3 - 6 Shots of liquor per week    Comment: 1-2 shots every 2-3 days 06/26/2021   Drug use: No   Sexual activity: Not on file  Other Topics Concern   Not on file  Social History Narrative   Patient drinks 1 cup of coffee a day    Social Drivers of Corporate investment banker Strain: Low Risk  (09/30/2023)   Overall Financial Resource Strain (CARDIA)    Difficulty of Paying Living Expenses: Not hard at all  Food Insecurity: No Food Insecurity (09/30/2023)   Hunger Vital Sign    Worried About Running Out of Food in the Last Year: Never  true    Ran Out of Food in the Last Year: Never true  Transportation Needs: No Transportation Needs (09/30/2023)   PRAPARE - Administrator, Civil Service (Medical): No    Lack of Transportation (Non-Medical): No  Physical Activity: Insufficiently Active (09/30/2023)   Exercise Vital Sign    Days of Exercise per Week: 2 days    Minutes of Exercise per Session: 30 min  Stress: Stress Concern Present (09/30/2023)   [employer] of Occupational Health - Occupational Stress Questionnaire    Feeling of Stress : To some extent  Social Connections: Unknown (09/30/2023)   Social Connection and Isolation Panel [NHANES]    Frequency of Communication with Friends and Family: Once a week    Frequency of Social Gatherings with Friends and Family: Twice a week    Attends Religious Services: Never    Database administrator or Organizations: Yes    Attends Engineer, structural: More than 4 times per year    Marital Status: Not on file  Intimate Partner Violence: Low Risk  (10/04/2019)   Received from [employer]   Intimate Partner Violence    Insults You: Not on file    Threatens You: Not on file    Screams at [place]: Not on file    Physically Hurt: Not on file    Intimate Partner Violence Score: Not on file    Review of Systems  All other systems reviewed and are negative.       Objective   BP 130/81 (BP Location: Right Arm, Patient Position: Sitting, Cuff Size: Large)   Pulse 61   Ht 5\' 10"  (1.778 m)   Wt 245 lb 9.6 oz (111.4 kg)   SpO2 93%   BMI 35.24 kg/m   Physical Exam Vitals and nursing note reviewed.  Constitutional:      General: He is not in acute distress.    Appearance: He is obese.  Cardiovascular:     Rate and Rhythm: Normal rate and regular rhythm.  Pulmonary:     Effort: Pulmonary effort is normal.     Breath sounds: Normal breath sounds.  Abdominal:     Palpations: Abdomen is soft.     Tenderness: There is no abdominal  tenderness.  Neurological:     General: No focal deficit present.     Mental Status: He is alert and oriented to person, place, and time.  Psychiatric:        Mood and Affect: Mood normal.        Behavior: Behavior normal.         Assessment & Plan:  Type 2 diabetes mellitus with other specified complication, without long-term current use of insulin  (HCC) -     Basic metabolic panel with GFR  Essential hypertension -     Basic metabolic panel with GFR  Pure hypercholesterolemia  Class 2 severe obesity due to excess calories with serious comorbidity and body mass index (BMI) of 35.0 to 35.9 in adult [facility])  Diabetes mellitus treated with oral medication (HCC)  History of alcohol abuse -     Magnesium   History of atrial fibrillation  Screen for STD (sexually transmitted disease) -     Ct, Ng, Mycoplasmas NAA, Urine  History of squamous cell carcinoma of skin -     Ambulatory referral to Dermatology  Encounter to establish care  Erectile dysfunction, unspecified erectile dysfunction type -     Ambulatory referral to Urology     Return in about 6 months (around 04/07/2024) for follow up.   Arlo Lama, MD

## 2023-10-07 ENCOUNTER — Ambulatory Visit: Payer: Self-pay | Admitting: Family Medicine

## 2023-10-07 LAB — BASIC METABOLIC PANEL WITH GFR
BUN/Creatinine Ratio: 21 (ref 10–24)
BUN: 23 mg/dL (ref 8–27)
CO2: 22 mmol/L (ref 20–29)
Calcium: 9.7 mg/dL (ref 8.6–10.2)
Chloride: 100 mmol/L (ref 96–106)
Creatinine, Ser: 1.11 mg/dL (ref 0.76–1.27)
Glucose: 140 mg/dL — ABNORMAL HIGH (ref 70–99)
Potassium: 4.2 mmol/L (ref 3.5–5.2)
Sodium: 143 mmol/L (ref 134–144)
eGFR: 76 mL/min/{1.73_m2} (ref >59)

## 2023-10-07 LAB — CT, NG, MYCOPLASMAS NAA, URINE

## 2023-10-07 LAB — URINE CYTOLOGY ANCILLARY ONLY
Chlamydia: NEGATIVE
Comment: NEGATIVE
Comment: NORMAL
Neisseria Gonorrhea: NEGATIVE

## 2023-10-07 LAB — MAGNESIUM: Magnesium: 2.1 mg/dL (ref 1.6–2.3)

## 2023-10-17 ENCOUNTER — Other Ambulatory Visit (HOSPITAL_COMMUNITY): Payer: Self-pay

## 2023-10-19 ENCOUNTER — Encounter: Payer: Self-pay | Admitting: Family Medicine

## 2023-10-21 NOTE — Telephone Encounter (Signed)
 Thanks , patient is aware and wanted to see particular people.

## 2023-10-31 ENCOUNTER — Other Ambulatory Visit (HOSPITAL_COMMUNITY): Payer: Self-pay

## 2023-11-22 NOTE — Progress Notes (Unsigned)
 Chief Complaint: ED  History of Present Illness:  Kyle Dawson is a 58 y.o. male who is seen in consultation from Tanda Bleacher, MD for evaluation of erectile dysfunction.  He has atrial fibrillation, diabetes, hypertension.  He has been on sildenafil  for at least 5 years, first at 50 mg, now out of 100.  Despite this, he still has problems obtaining and maintaining erections.  No prior history of curvature or pain with erections.   Past Medical History:  Past Medical History:  Diagnosis Date   Basal cell carcinoma    Chest pain    Diabetes mellitus without complication (HCC)    Edema    lower extremity   Hypertension    Squamous cell carcinoma    R ear, nose, each side of face    Past Surgical History:  Past Surgical History:  Procedure Laterality Date   ATRIAL FIBRILLATION ABLATION N/A 04/22/2021   Procedure: ATRIAL FIBRILLATION ABLATION;  Surgeon: Inocencio Soyla Lunger, MD;  [facility];  Service: Cardiovascular;  Laterality: N/A;   Removal basal cell carcinoma     Removal squamous cell carcinoma     RIGHT/LEFT HEART CATH AND CORONARY ANGIOGRAPHY N/A 12/23/2022   Procedure: RIGHT/LEFT HEART CATH AND CORONARY ANGIOGRAPHY;  Surgeon: Wendel Lurena POUR, MD;  [facility];  Service: Cardiovascular;  Laterality: N/A;    Allergies:  No Known Allergies  Family History:  Family History  Problem Relation Age of Onset   Peripheral vascular disease Mother 65   Hypertension Mother    Heart failure Mother    CAD Maternal Grandfather    Heart failure Paternal Grandfather    Hypertension Father     Social History:  Social History   Tobacco Use   Smoking status: Never   Smokeless tobacco: Never   Tobacco comments:    Never smoked 12/27/22  Vaping Use   Vaping status: Never Used  Substance Use Topics   Alcohol use: Not Currently    Alcohol/week: 3.0 - 6.0 standard drinks of alcohol    Types: 3 - 6 Shots of liquor per week     Comment: 1-2 shots every 2-3 days 06/26/2021   Drug use: No    Review of symptoms:  Constitutional:  Negative for unexplained weight loss, night sweats, fever, chills ENT:  Negative for nose bleeds, sinus pain, painful swallowing CV:  Negative for chest pain, shortness of breath, exercise intolerance, palpitations, loss of consciousness Resp:  Negative for cough, wheezing, shortness of breath GI:  Negative for nausea, vomiting, diarrhea, bloody stools GU:  Positives noted in HPI; otherwise negative for gross hematuria, dysuria, urinary incontinence Neuro:  Negative for seizures, poor balance, limb weakness, slurred speech Psych:  Negative for lack of energy, depression, anxiety Endocrine:  Negative for polydipsia, polyuria, symptoms of hypoglycemia (dizziness, hunger, sweating) Hematologic:  Negative for anemia, purpura, petechia, prolonged or excessive bleeding, use of anticoagulants  Allergic:  Negative for difficulty breathing or choking as a result of exposure to anything; no shellfish allergy; no allergic response (rash/itch) to materials, foods  Physical exam: There were no vitals taken for this visit. GENERAL APPEARANCE:  Well appearing, well developed, well nourished, NAD HEENT: Atraumatic, Normocephalic. NECK: Normal appearance LUNGS: Normal inspiratory and expiratory excursion HEART: Regular Rate ABDOMEN: Obese, no inguinal hernias GU: Phallus normal, no lesions. Scrotal skin normal. Testicles/epididymal structures normal. Meatus normal. Normal anal sphincter tone, prostate 30 mL, symmetric, non nodular, non tender. EXTREMITIES: Moves all extremities well.  Without clubbing, cyanosis, or edema. NEUROLOGIC:  Alert and oriented x 3, normal gait, CN II-XII grossly intact.  MENTAL STATUS:  Appropriate. SKIN:  Warm, dry and intact.    Results: Most recent CBC/CMP reviewed  I have reviewed referring/prior physicians notes  I have reviewed urinalysis--clear except for  glycosuria  I have reviewed PSA results--last PSA from January 2022 0.78  Last hemoglobin A1c 7.0    Assessment: ED, progressing   Plan: I will start him on prostaglandin injections-PGE1 (20 mcg/milliliter), inject 0.5 to 1.0 mL as needed, number five 1 mL vials with as needed refills.  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  I do not think we need to give him a visit for trial injections here  I will have him come back in 3 months to check on efficacy.

## 2023-11-23 ENCOUNTER — Ambulatory Visit: Admitting: Urology

## 2023-11-23 VITALS — BP 142/80 | HR 70 | Ht 70.0 in | Wt 237.0 lb

## 2023-11-23 DIAGNOSIS — N5201 Erectile dysfunction due to arterial insufficiency: Secondary | ICD-10-CM

## 2023-11-23 LAB — MICROSCOPIC EXAMINATION

## 2023-11-23 LAB — URINALYSIS, ROUTINE W REFLEX MICROSCOPIC
Bilirubin, UA: NEGATIVE
Leukocytes,UA: NEGATIVE
Nitrite, UA: NEGATIVE
Protein,UA: NEGATIVE
RBC, UA: NEGATIVE
Specific Gravity, UA: 1.015 (ref 1.005–1.030)
Urobilinogen, Ur: 1 mg/dL (ref 0.2–1.0)
pH, UA: 6.5 (ref 5.0–7.5)

## 2023-11-24 ENCOUNTER — Telehealth: Payer: Self-pay

## 2023-11-24 NOTE — Telephone Encounter (Signed)
[facility] into [facility] 

## 2023-11-24 NOTE — Telephone Encounter (Signed)
-----   Message from Garnette HERO Dahlstedt sent at 11/23/2023  3:56 PM EDT ----- Please [facility]:  Prostaglandin E1 (20 mg/milliliter) Inject 0.5 to 1 mL as needed Dispense number five 1 mL vials 11 refills

## 2023-11-26 ENCOUNTER — Other Ambulatory Visit (HOSPITAL_COMMUNITY): Payer: Self-pay

## 2023-12-03 ENCOUNTER — Other Ambulatory Visit (HOSPITAL_COMMUNITY): Payer: Self-pay

## 2023-12-05 ENCOUNTER — Ambulatory Visit: Admitting: Dermatology

## 2023-12-05 ENCOUNTER — Encounter: Payer: Self-pay | Admitting: Dermatology

## 2023-12-05 ENCOUNTER — Other Ambulatory Visit (HOSPITAL_COMMUNITY): Payer: Self-pay

## 2023-12-05 VITALS — BP 144/84 | HR 57

## 2023-12-05 DIAGNOSIS — L814 Other melanin hyperpigmentation: Secondary | ICD-10-CM

## 2023-12-05 DIAGNOSIS — R229 Localized swelling, mass and lump, unspecified: Secondary | ICD-10-CM

## 2023-12-05 DIAGNOSIS — D1801 Hemangioma of skin and subcutaneous tissue: Secondary | ICD-10-CM

## 2023-12-05 DIAGNOSIS — R220 Localized swelling, mass and lump, head: Secondary | ICD-10-CM

## 2023-12-05 DIAGNOSIS — L570 Actinic keratosis: Secondary | ICD-10-CM | POA: Diagnosis not present

## 2023-12-05 DIAGNOSIS — L720 Epidermal cyst: Secondary | ICD-10-CM

## 2023-12-05 DIAGNOSIS — L820 Inflamed seborrheic keratosis: Secondary | ICD-10-CM | POA: Diagnosis not present

## 2023-12-05 DIAGNOSIS — L578 Other skin changes due to chronic exposure to nonionizing radiation: Secondary | ICD-10-CM

## 2023-12-05 DIAGNOSIS — Z1283 Encounter for screening for malignant neoplasm of skin: Secondary | ICD-10-CM | POA: Diagnosis not present

## 2023-12-05 DIAGNOSIS — D492 Neoplasm of unspecified behavior of bone, soft tissue, and skin: Secondary | ICD-10-CM | POA: Diagnosis not present

## 2023-12-05 DIAGNOSIS — L821 Other seborrheic keratosis: Secondary | ICD-10-CM | POA: Diagnosis not present

## 2023-12-05 DIAGNOSIS — W908XXA Exposure to other nonionizing radiation, initial encounter: Secondary | ICD-10-CM

## 2023-12-05 DIAGNOSIS — D229 Melanocytic nevi, unspecified: Secondary | ICD-10-CM

## 2023-12-05 DIAGNOSIS — D485 Neoplasm of uncertain behavior of skin: Secondary | ICD-10-CM

## 2023-12-05 DIAGNOSIS — Z7189 Other specified counseling: Secondary | ICD-10-CM

## 2023-12-05 MED ORDER — FLUOROURACIL 5 % EX CREA
TOPICAL_CREAM | Freq: Two times a day (BID) | CUTANEOUS | 1 refills | Status: DC
  Filled 2023-12-05: qty 40, 14d supply, fill #0
  Filled 2023-12-15: qty 40, 14d supply, fill #1

## 2023-12-05 NOTE — Progress Notes (Signed)
 New Patient Visit   Subjective  Kyle Dawson is a 58 y.o. male who presents for the following: Total Body Skin Exam (TBSE)  Patient present today for new patient visit for TBSE.The patient reports he  has spots, moles and lesions to be evaluated, some may be new or changing.Patient has previously been treated by a dermatologist.Patient reports he  has hx of bx. Patient reports family history of skin cancers. Patient reports throughout his lifetime has had severe sun exposure. Currently, patient reports if he  has excessive sun exposure, he  does apply sunscreen and/or wears protective coverings.  Previously treated by Dr. Anniece.   The following portions of the chart were reviewed this encounter and updated as appropriate: medications, allergies, medical history  Review of Systems:  No other skin or systemic complaints except as noted in HPI or Assessment and Plan.  Objective  Well appearing patient in no apparent distress; mood and affect are within normal limits.  A full examination was performed including scalp, head, eyes, ears, nose, lips, neck, chest, axillae, abdomen, back, buttocks, bilateral upper extremities, bilateral lower extremities, hands, feet, fingers, toes, fingernails, and toenails. All findings within normal limits unless otherwise noted below.   Relevant exam findings are noted in the Assessment and Plan.       Right Upper Arm - Posterior 1.8 cm tan brown patch Left Ear, Left Malar Cheek, Left Parotid Area, Left Preauricular Area, Left Temple, Mid Parietal Scalp (2), Right Buccal Cheek, Right Parotid Area Erythematous thin papules/macules with gritty scale.  Left Thigh - Posterior Inflamed stuck on papule  Assessment & Plan   LENTIGINES, SEBORRHEIC KERATOSES, HEMANGIOMAS - Benign normal skin lesions - Benign-appearing - Call for any changes  MELANOCYTIC NEVI - Tan-brown and/or pink-flesh-colored symmetric macules and papules - Benign appearing on  exam today - Observation - Call clinic for new or changing moles - Recommend daily use of broad spectrum spf 30+ sunscreen to sun-exposed areas.   ACTINIC DAMAGE - Chronic condition, secondary to cumulative UV/sun exposure - diffuse scaly erythematous macules with underlying dyspigmentation - Recommend daily broad spectrum sunscreen SPF 30+ to sun-exposed areas, reapply every 2 hours as needed.  - Staying in the shade or wearing long sleeves, sun glasses (UVA+UVB protection) and wide brim hats (4-inch brim around the entire circumference of the hat) are also recommended for sun protection.  - Call for new or changing lesions.  Subcutaneous Nodule Exam: Subcutaneous nodule at Left Cheek  Benign-appearing. Plan to U/S  Exam most consistent with an epidermal inclusion cyst. Discussed that a cyst is a benign growth that can grow over time and sometimes get irritated or inflamed. Recommend observation if it is not bothersome. Discussed option of surgical excision to remove it if it is growing, symptomatic, or other changes noted. Please call for new or changing lesions so they can be evaluated.  ACTINIC KERATOSIS Exam: Erythematous thin papules/macules with gritty scale at the face and scalp  Actinic keratoses are precancerous spots that appear secondary to cumulative UV radiation exposure/sun exposure over time. They are chronic with expected duration over 1 year. A portion of actinic keratoses will progress to squamous cell carcinoma of the skin. It is not possible to reliably predict which spots will progress to skin cancer and so treatment is recommended to prevent development of skin cancer.  Recommend daily broad spectrum sunscreen SPF 30+ to sun-exposed areas, reapply every 2 hours as needed.  Recommend staying in the shade or wearing long sleeves,  sun glasses (UVA+UVB protection) and wide brim hats (4-inch brim around the entire circumference of the hat). Call for new or changing  lesions.  Treatment Plan: Start 5-fluorouracil  cream twice a day for 14 days to affected areas including face.  Reviewed course of treatment and expected reaction.  Patient advised to expect inflammation and crusting and advised that erosions are possible.  Patient advised to be diligent with sun protection during and after treatment. Handout with details of how to apply medication and what to expect provided. Counseled to keep medication out of reach of children and pets.  Reviewed course of treatment and expected reaction.  Patient advised to expect inflammation and crusting and advised that erosions are possible.  Patient advised to be diligent with sun protection during and after treatment. Handout with details of how to apply medication and what to expect provided. Counseled to keep medication out of reach of children and pets.   HISTORY OF NONMELANOMA SKIN CANCER - No evidence of recurrence today - Recommend regular full body skin exams - Recommend daily broad spectrum sunscreen SPF 30+ to sun-exposed areas, reapply every 2 hours as needed.  - Call if any new or changing lesions are noted between office visits   SKIN CANCER SCREENING PERFORMED TODAY NEOPLASM OF UNCERTAIN BEHAVIOR OF SKIN Right Upper Arm - Posterior Epidermal / dermal shaving  Lesion diameter (cm):  1.8 Informed consent: discussed and consent obtained   Timeout: patient name, date of birth, surgical site, and procedure verified   Anesthesia: the lesion was anesthetized in a standard fashion   Anesthetic:  1% lidocaine  w/ epinephrine 1-100,000 buffered w/ 8.4% NaHCO3 Instrument used: DermaBlade   Hemostasis achieved with: aluminum chloride   Outcome: patient tolerated procedure well   Post-procedure details: wound care instructions given    Specimen A - Surgical pathology Differential Diagnosis: R/O MM vs Lentigo  Check Margins: No AK (ACTINIC KERATOSIS) (9) Left Ear, Left Malar Cheek, Left Parotid Area, Left  Preauricular Area, Left Temple, Mid Parietal Scalp (2), Right Buccal Cheek, Right Parotid Area Destruction of lesion - Left Ear, Left Malar Cheek, Left Parotid Area, Left Preauricular Area, Left Temple, Mid Parietal Scalp (2), Right Buccal Cheek, Right Parotid Area Complexity: simple   Destruction method: cryotherapy   Timeout:  patient name, date of birth, surgical site, and procedure verified Lesion destroyed using liquid nitrogen: Yes   Region frozen until ice ball extended beyond lesion: Yes   Cryotherapy cycles:  9  Related Medications fluorouracil  (EFUDEX ) 5 % cream Apply topically 2 (two) times daily. Apply 2 times daily for 2 weeks INFLAMED SEBORRHEIC KERATOSIS Left Thigh - Posterior Destruction of lesion - Left Thigh - Posterior Complexity: simple   Destruction method: cryotherapy   Timeout:  patient name, date of birth, surgical site, and procedure verified Lesion destroyed using liquid nitrogen: Yes   Region frozen until ice ball extended beyond lesion: Yes   Cryotherapy cycles:  1  EPIDERMAL CYST   Related Procedures US  Soft Tissue Head/Neck (NON-THYROID ) SUBCUTANEOUS NODULE   Related Procedures US  Soft Tissue Head/Neck (NON-THYROID ) LENTIGINES   SEBORRHEIC KERATOSIS   MULTIPLE BENIGN NEVI   CHERRY ANGIOMA    Return in about 8 weeks (around 01/30/2024) for AK F/U.  I, Jetta Ager, am acting as scribe for RUFUS CHRISTELLA HOLY, MD.  Documentation: I have reviewed the above documentation for accuracy and completeness, and I agree with the above.  RUFUS CHRISTELLA HOLY, MD

## 2023-12-05 NOTE — Patient Instructions (Addendum)
Cryotherapy Aftercare  Wash gently with soap and water everyday.   Apply Vaseline and Band-Aid daily until healed.  Patient Handout: Wound Care for Skin Biopsy Site  Taking Care of Your Skin Biopsy Site  Proper care of the biopsy site is essential for promoting healing and minimizing scarring. This handout provides instructions on how to care for your biopsy site to ensure optimal recovery.  1. Cleaning the Wound:  Clean the biopsy site daily with gentle soap and water. Gently pat the area dry with a clean, soft towel. Avoid harsh scrubbing or rubbing the area, as this can irritate the skin and delay healing.  2. Applying Aquaphor and Bandage:  After cleaning the wound, apply a thin layer of Aquaphor ointment to the biopsy site. Cover the area with a sterile bandage to protect it from dirt, bacteria, and friction. Change the bandage daily or as needed if it becomes soiled or wet.  3. Continued Care for One Week:  Repeat the cleaning, Aquaphor application, and bandaging process daily for one week following the biopsy procedure. Keeping the wound clean and moist during this initial healing period will help prevent infection and promote optimal healing.  4. Massaging Aquaphor into the Area:  ---After one week, discontinue the use of bandages but continue to apply Aquaphor to the biopsy site. ----Gently massage the Aquaphor into the area using circular motions. ---Massaging the skin helps to promote circulation and prevent the formation of scar tissue.   Additional Tips:  Avoid exposing the biopsy site to direct sunlight during the healing process, as this can cause hyperpigmentation or worsen scarring. If you experience any signs of infection, such as increased redness, swelling, warmth, or drainage from the wound, contact your healthcare provider immediately. Follow any additional instructions provided by your healthcare provider for caring for the biopsy site and managing any  discomfort. Conclusion:  Taking proper care of your skin biopsy site is crucial for ensuring optimal healing and minimizing scarring. By following these instructions for cleaning, applying Aquaphor, and massaging the area, you can promote a smooth and successful recovery. If you have any questions or concerns about caring for your biopsy site, don't hesitate to contact your healthcare provider for guidance.    Important Information   Due to recent changes in healthcare laws, you may see results of your pathology and/or laboratory studies on MyChart before the doctors have had a chance to review them. We understand that in some cases there may be results that are confusing or concerning to you. Please understand that not all results are received at the same time and often the doctors may need to interpret multiple results in order to provide you with the best plan of care or course of treatment. Therefore, we ask that you please give [place] 2 business days to thoroughly review all your results before contacting the office for clarification. Should we see a critical lab result, you will be contacted sooner.     If You Need Anything After Your Visit   If you have any questions or concerns for your doctor, please call our main line at [phone]. If no one answers, please leave a voicemail as directed and we will return your call as soon as possible. Messages left after 4 pm will be answered the following business day.    You may also send [place] a message via MyChart. We typically respond to MyChart messages within 1-2 business days.  For prescription refills, please ask your pharmacy to contact our  office. Our fax number is [phone].  If you have an urgent issue when the clinic is closed that cannot wait until the next business day, you can page your doctor at the number below.     Please note that while we do our best to be available for urgent issues outside of office hours, we are not available  24/7.    If you have an urgent issue and are unable to reach [place], you may choose to seek medical care at your doctor's office, retail clinic, urgent care center, or emergency room.   If you have a medical emergency, please immediately call 911 or go to the emergency department. In the event of inclement weather, please call our main line at [phone] for an update on the status of any delays or closures.  Dermatology Medication Tips: Please keep the boxes that topical medications come in in order to help keep track of the instructions about where and how to use these. Pharmacies typically print the medication instructions only on the boxes and not directly on the medication tubes.   If your medication is too expensive, please contact our office at [phone] or send [place] a message through MyChart.    We are unable to tell what your co-pay for medications will be in advance as this is different depending on your insurance coverage. However, we may be able to find a substitute medication at lower cost or fill out paperwork to get insurance to cover a needed medication.    If a prior authorization is required to get your medication covered by your insurance company, please allow [place] 1-2 business days to complete this process.   Drug prices often vary depending on where the prescription is filled and some pharmacies may offer cheaper prices.   The website www.goodrx.com contains coupons for medications through different pharmacies. The prices here do not account for what the cost may be with help from insurance (it may be cheaper with your insurance), but the website can give you the price if you did not use any insurance.  - You can print the associated coupon and take it with your prescription to the pharmacy.  - You may also stop by our office during regular business hours and pick up a GoodRx coupon card.  - If you need your prescription sent electronically to a different pharmacy, notify our  office through [facility] or by phone at [phone]

## 2023-12-07 ENCOUNTER — Ambulatory Visit: Payer: Self-pay | Admitting: Dermatology

## 2023-12-07 LAB — SURGICAL PATHOLOGY

## 2023-12-13 ENCOUNTER — Ambulatory Visit (INDEPENDENT_AMBULATORY_CARE_PROVIDER_SITE_OTHER)
Admission: RE | Admit: 2023-12-13 | Discharge: 2023-12-13 | Disposition: A | Discharge status: Home / Self Care | Source: Ambulatory Visit | Attending: Dermatology | Admitting: Dermatology

## 2023-12-13 DIAGNOSIS — R220 Localized swelling, mass and lump, head: Secondary | ICD-10-CM | POA: Diagnosis not present

## 2023-12-13 DIAGNOSIS — R229 Localized swelling, mass and lump, unspecified: Secondary | ICD-10-CM

## 2023-12-15 ENCOUNTER — Other Ambulatory Visit: Payer: Self-pay

## 2024-01-05 ENCOUNTER — Other Ambulatory Visit: Payer: Self-pay

## 2024-01-05 ENCOUNTER — Other Ambulatory Visit: Payer: Self-pay | Admitting: Internal Medicine

## 2024-01-05 ENCOUNTER — Other Ambulatory Visit (HOSPITAL_COMMUNITY): Payer: Self-pay

## 2024-01-05 ENCOUNTER — Other Ambulatory Visit: Payer: Self-pay | Admitting: Cardiology

## 2024-01-05 ENCOUNTER — Encounter: Payer: Self-pay | Admitting: Pharmacist

## 2024-01-05 DIAGNOSIS — I480 Paroxysmal atrial fibrillation: Secondary | ICD-10-CM

## 2024-01-05 MED ORDER — METOPROLOL SUCCINATE ER 50 MG PO TB24
50.0000 mg | ORAL_TABLET | Freq: Two times a day (BID) | ORAL | 11 refills | Status: AC
  Filled 2024-01-05: qty 60, 30d supply, fill #0
  Filled 2024-01-30: qty 60, 30d supply, fill #1
  Filled 2024-02-10 – 2024-02-29 (×2): qty 60, 30d supply, fill #2
  Filled 2024-04-05: qty 60, 30d supply, fill #3
  Filled 2024-05-01 (×2): qty 60, 30d supply, fill #4
  Filled 2024-06-12: qty 60, 30d supply, fill #5

## 2024-01-05 MED ORDER — APIXABAN 5 MG PO TABS
5.0000 mg | ORAL_TABLET | Freq: Two times a day (BID) | ORAL | 1 refills | Status: AC
  Filled 2024-01-05: qty 180, 90d supply, fill #0
  Filled 2024-01-13: qty 60, 30d supply, fill #0
  Filled 2024-02-10: qty 60, 30d supply, fill #1
  Filled 2024-03-11: qty 60, 30d supply, fill #2
  Filled 2024-05-01 (×2): qty 60, 30d supply, fill #3
  Filled 2024-05-27: qty 60, 30d supply, fill #4

## 2024-01-05 NOTE — Telephone Encounter (Signed)
 Prescription refill request for Eliquis  received. Indication: a fib Last office visit: 05/02/23 Scr: 1.11 epic 10/06/23 Age: 58 Weight: 107kg

## 2024-01-13 ENCOUNTER — Other Ambulatory Visit (HOSPITAL_COMMUNITY): Payer: Self-pay

## 2024-01-13 ENCOUNTER — Other Ambulatory Visit: Payer: Self-pay

## 2024-01-13 ENCOUNTER — Other Ambulatory Visit: Payer: Self-pay | Admitting: Endocrinology

## 2024-01-13 DIAGNOSIS — E1165 Type 2 diabetes mellitus with hyperglycemia: Secondary | ICD-10-CM

## 2024-01-14 ENCOUNTER — Other Ambulatory Visit (HOSPITAL_COMMUNITY): Payer: Self-pay

## 2024-01-14 MED ORDER — GLIPIZIDE 5 MG PO TABS
5.0000 mg | ORAL_TABLET | Freq: Two times a day (BID) | ORAL | 3 refills | Status: AC
  Filled 2024-01-14: qty 90, 45d supply, fill #0
  Filled 2024-02-10 – 2024-02-29 (×2): qty 90, 45d supply, fill #1
  Filled 2024-05-01 (×2): qty 90, 45d supply, fill #2
  Filled 2024-06-12: qty 90, 45d supply, fill #3

## 2024-01-16 ENCOUNTER — Other Ambulatory Visit: Payer: Self-pay

## 2024-01-25 ENCOUNTER — Ambulatory Visit: Admitting: Urology

## 2024-01-30 ENCOUNTER — Other Ambulatory Visit (HOSPITAL_COMMUNITY): Payer: Self-pay

## 2024-02-02 ENCOUNTER — Encounter: Payer: Self-pay | Admitting: Dermatology

## 2024-02-02 ENCOUNTER — Ambulatory Visit: Admitting: Dermatology

## 2024-02-02 VITALS — BP 135/75 | HR 60

## 2024-02-02 DIAGNOSIS — L720 Epidermal cyst: Secondary | ICD-10-CM

## 2024-02-02 DIAGNOSIS — L570 Actinic keratosis: Secondary | ICD-10-CM

## 2024-02-02 DIAGNOSIS — D485 Neoplasm of uncertain behavior of skin: Secondary | ICD-10-CM | POA: Diagnosis not present

## 2024-02-02 DIAGNOSIS — Z872 Personal history of diseases of the skin and subcutaneous tissue: Secondary | ICD-10-CM

## 2024-02-02 NOTE — Patient Instructions (Signed)

## 2024-02-02 NOTE — Progress Notes (Signed)
   Follow-Up Visit   Subjective  Kyle Dawson is a 58 y.o. male who presents for the following: AKs  Pt here to follow up s/p efudex  treatment of face. Pt treated bid for 2 weeks and had a good reaction.  The following portions of the chart were reviewed this encounter and updated as appropriate: medications, allergies, medical history  Review of Systems:  No other skin or systemic complaints except as noted in HPI or Assessment and Plan.  Objective  Well appearing patient in no apparent distress; mood and affect are within normal limits.  A focused examination was performed of the following areas: face  Relevant exam findings are noted in the Assessment and Plan.  Left Buccal Cheek Violaceous macule   Assessment & Plan   Violaceous macule on left cheek- Traumatic angioma vs other - Photo taken today, will monitor for changes  EIC- left cheek - Ultrasound confirmed - scheduled for excision  NEOPLASM OF UNCERTAIN BEHAVIOR OF SKIN Left Buccal Cheek Will recheck lesion in a few months.  HISTORY OF PRECANCEROUS ACTINIC KERATOSIS face s/p carac  treatment - site(s) of PreCancerous Actinic Keratosis clear today. - these may recur and new lesions may form requiring treatment to prevent transformation into skin cancer - observe for new or changing spots and contact  [facility] for [facility] if occur - photoprotection with sun protective clothing; sunglasses and broad spectrum sunscreen with SPF of at least 30 + and frequent self skin exams recommended - yearly exams by a dermatologist recommended for persons with history of PreCancerous Actinic Keratoses   Return for TBSE in january.  I, Darice Smock, CMA, am acting as scribe for RUFUS CHRISTELLA HOLY, MD.   Documentation: I have reviewed the above documentation for accuracy and completeness, and I agree with the above.  RUFUS CHRISTELLA HOLY, MD

## 2024-02-10 ENCOUNTER — Other Ambulatory Visit (HOSPITAL_COMMUNITY): Payer: Self-pay

## 2024-02-10 ENCOUNTER — Other Ambulatory Visit: Payer: Self-pay

## 2024-02-21 NOTE — Progress Notes (Incomplete)
 Assessment: ED, progressing   Plan:   History of Present Illness:  Initially seen 7.16.2025 for evaluation of erectile dysfunction.  He has atrial fibrillation, diabetes, hypertension.  He has been on sildenafil  for at least 5 years, first at 50 mg, now out of 100.  Despite this, he still has problems obtaining and maintaining erections.  No prior history of curvature or pain with erections.   Past Medical History:  Past Medical History:  Diagnosis Date   Basal cell carcinoma    Chest pain    Diabetes mellitus without complication (HCC)    Edema    lower extremity   Hypertension    Squamous cell carcinoma    R ear, nose, each side of face    Past Surgical History:  Past Surgical History:  Procedure Laterality Date   ATRIAL FIBRILLATION ABLATION N/A 04/22/2021   Procedure: ATRIAL FIBRILLATION ABLATION;  Surgeon: Inocencio Soyla Lunger, MD;  Location: [facility];  Service: Cardiovascular;  Laterality: N/A;   Removal basal cell carcinoma     Removal squamous cell carcinoma     RIGHT/LEFT HEART CATH AND CORONARY ANGIOGRAPHY N/A 12/23/2022   Procedure: RIGHT/LEFT HEART CATH AND CORONARY ANGIOGRAPHY;  Surgeon: Wendel Lurena POUR, MD;  Location: [facility];  Service: Cardiovascular;  Laterality: N/A;    Allergies:  No Known Allergies  Family History:  Family History  Problem Relation Age of Onset   Peripheral vascular disease Mother 49   Hypertension Mother    Heart failure Mother    CAD Maternal Grandfather    Heart failure Paternal Grandfather    Hypertension Father     Social History:  Social History   Tobacco Use   Smoking status: Never   Smokeless tobacco: Never   Tobacco comments:    Never smoked 12/27/22  Vaping Use   Vaping status: Never Used  Substance Use Topics   Alcohol use: Not Currently    Alcohol/week: 3.0 - 6.0 standard drinks of alcohol    Types: 3 - 6 Shots of liquor per week    Comment: 1-2 shots every 2-3 days 06/26/2021    Drug use: No    Review of symptoms:  Constitutional:  Negative for unexplained weight loss, night sweats, fever, chills ENT:  Negative for nose bleeds, sinus pain, painful swallowing CV:  Negative for chest pain, shortness of breath, exercise intolerance, palpitations, loss of consciousness Resp:  Negative for cough, wheezing, shortness of breath GI:  Negative for nausea, vomiting, diarrhea, bloody stools GU:  Positives noted in HPI; otherwise negative for gross hematuria, dysuria, urinary incontinence Neuro:  Negative for seizures, poor balance, limb weakness, slurred speech Psych:  Negative for lack of energy, depression, anxiety Endocrine:  Negative for polydipsia, polyuria, symptoms of hypoglycemia (dizziness, hunger, sweating) Hematologic:  Negative for anemia, purpura, petechia, prolonged or excessive bleeding, use of anticoagulants  Allergic:  Negative for difficulty breathing or choking as a result of exposure to anything; no shellfish allergy; no allergic response (rash/itch) to materials, foods  Physical exam: There were no vitals taken for this visit. GENERAL APPEARANCE:  Well appearing, well developed, well nourished, NAD HEENT: Atraumatic, Normocephalic. NECK: Normal appearance LUNGS: Normal inspiratory and expiratory excursion HEART: Regular Rate ABDOMEN: Obese, no inguinal hernias GU: Phallus normal, no lesions. Scrotal skin normal. Testicles/epididymal structures normal. Meatus normal. Normal anal sphincter tone, prostate 30 mL, symmetric, non nodular, non tender. EXTREMITIES: Moves all extremities well.  Without clubbing, cyanosis, or edema. NEUROLOGIC:  Alert and  oriented x 3, normal gait, CN II-XII grossly intact.  MENTAL STATUS:  Appropriate. SKIN:  Warm, dry and intact.    Results: Most recent CBC/CMP reviewed  I have reviewed referring/prior physicians notes  I have reviewed urinalysis--clear except for glycosuria  I have reviewed PSA results--last PSA from  January 2022 0.78  Last hemoglobin A1c 7.0

## 2024-02-22 ENCOUNTER — Ambulatory Visit: Admitting: Urology

## 2024-02-28 ENCOUNTER — Encounter: Payer: Self-pay | Admitting: Dermatology

## 2024-02-29 ENCOUNTER — Other Ambulatory Visit (HOSPITAL_COMMUNITY): Payer: Self-pay

## 2024-03-01 ENCOUNTER — Ambulatory Visit: Admitting: Dermatology

## 2024-03-01 ENCOUNTER — Encounter: Payer: Self-pay | Admitting: Dermatology

## 2024-03-01 VITALS — BP 151/86 | HR 58 | Temp 98.1°F

## 2024-03-01 DIAGNOSIS — D2339 Other benign neoplasm of skin of other parts of face: Secondary | ICD-10-CM | POA: Diagnosis not present

## 2024-03-01 DIAGNOSIS — D485 Neoplasm of uncertain behavior of skin: Secondary | ICD-10-CM | POA: Diagnosis not present

## 2024-03-01 NOTE — Patient Instructions (Signed)

## 2024-03-01 NOTE — Progress Notes (Signed)
 Follow-Up Visit   Subjective  Kyle Dawson is a 58 y.o. male who presents for the following: Excision of a subcutaneous nodule on the left cheek. Cyst has been present for 1 year. Denies pain or drainage.   The following portions of the chart were reviewed this encounter and updated as appropriate: medications, allergies, medical history  Review of Systems:  No other skin or systemic complaints except as noted in HPI or Assessment and Plan.  Objective  Well appearing patient in no apparent distress; mood and affect are within normal limits.  A focused examination was performed of the following areas: Left cheek Relevant physical exam findings are noted in the Assessment and Plan.   Left Cheek Subcutaneous nodule   Assessment & Plan   NEOPLASM OF UNCERTAIN BEHAVIOR OF SKIN Left Cheek Skin excision  Excision method:  elliptical Lesion length (cm):  2.1 Lesion width (cm):  1.8 Margin per side (cm):  0.1 Total excision diameter (cm):  2.3 Informed consent: discussed and consent obtained   Timeout: patient name, date of birth, surgical site, and procedure verified   Procedure prep:  Patient was prepped and draped in usual sterile fashion Prep type:  Chlorhexidine Anesthesia: the lesion was anesthetized in a standard fashion   Anesthetic:  1% lidocaine  w/ epinephrine 1-100,000 buffered w/ 8.4% NaHCO3 Instrument used: #15 blade   Hemostasis achieved with: suture, pressure and electrodesiccation   Outcome: patient tolerated procedure well with no complications   Post-procedure details: sterile dressing applied and wound care instructions given    Skin repair Complexity:  Complex Final length (cm):  3.5 Informed consent: discussed and consent obtained   Timeout: patient name, date of birth, surgical site, and procedure verified   Procedure prep:  Patient was prepped and draped in usual sterile fashion Prep type:  Chlorhexidine Anesthesia: the lesion was anesthetized  in a standard fashion   Anesthetic:  1% lidocaine  w/ epinephrine 1-100,000 buffered w/ 8.4% NaHCO3 Reason for type of repair: preserve normal anatomy, preserve normal anatomical and functional relationships and avoid adjacent structures   Undermining: area extensively undermined   Subcutaneous layers (deep stitches):  Suture size:  5-0 Suture type: Monocryl (poliglecaprone 25)   Stitches:  Buried vertical mattress Fine/surface layer approximation (top stitches):  Suture size:  6-0 Suture type: fast-absorbing plain gut   Stitches: running subcuticular   Hemostasis achieved with: electrodesiccation Outcome: patient tolerated procedure well with no complications   Post-procedure details: sterile dressing applied and wound care instructions given   Dressing type: pressure dressing and petrolatum    Specimen 1 - Surgical pathology Differential Diagnosis: R/O cyst vs lipoma vs other  Check Margins: No  The surgical wound was then cleaned, prepped, and re-anesthetized as above. Wound edges were undermined extensively along at least one entire edge and at a distance equal to or greater than the width of the defect (see wound defect size above) in order to achieve closure and decrease wound tension and anatomic distortion. Redundant tissue repair including standing cone removal was performed. Hemostasis was achieved with electrocautery. Subcutaneous and epidermal tissues were approximated with the above sutures. The surgical site was then lightly scrubbed with sterile, saline-soaked gauze. The area was then bandaged using Vaseline ointment, non-adherent gauze, gauze pads, and tape to provide an adequate pressure dressing. The patient tolerated the procedure well, was given detailed written and verbal wound care instructions, and was discharged in good condition.   The patient will follow-up: PRN.  Return in about 1  month (around 04/01/2024) for Follow Up.  I, Rollene Gobble, RN, am acting as  scribe for RUFUS CHRISTELLA HOLY, MD .   Documentation: I have reviewed the above documentation for accuracy and completeness, and I agree with the above.  RUFUS CHRISTELLA HOLY, MD

## 2024-03-02 LAB — SURGICAL PATHOLOGY

## 2024-03-05 ENCOUNTER — Ambulatory Visit: Payer: Self-pay | Admitting: Dermatology

## 2024-03-12 ENCOUNTER — Other Ambulatory Visit (HOSPITAL_COMMUNITY): Payer: Self-pay

## 2024-04-02 ENCOUNTER — Ambulatory Visit: Admitting: Dermatology

## 2024-04-06 ENCOUNTER — Other Ambulatory Visit (HOSPITAL_COMMUNITY): Payer: Self-pay

## 2024-04-09 ENCOUNTER — Encounter: Payer: Self-pay | Admitting: Family Medicine

## 2024-04-09 ENCOUNTER — Other Ambulatory Visit (HOSPITAL_COMMUNITY): Payer: Self-pay

## 2024-04-09 ENCOUNTER — Other Ambulatory Visit: Payer: Self-pay

## 2024-04-09 ENCOUNTER — Ambulatory Visit (INDEPENDENT_AMBULATORY_CARE_PROVIDER_SITE_OTHER): Admitting: Family Medicine

## 2024-04-09 ENCOUNTER — Other Ambulatory Visit: Payer: Self-pay | Admitting: Internal Medicine

## 2024-04-09 ENCOUNTER — Other Ambulatory Visit (HOSPITAL_COMMUNITY)
Admission: RE | Admit: 2024-04-09 | Discharge: 2024-04-09 | Disposition: A | Discharge status: Home / Self Care | Source: Ambulatory Visit | Attending: Family Medicine | Admitting: Family Medicine

## 2024-04-09 VITALS — BP 133/76 | HR 54 | Ht 70.0 in | Wt 248.8 lb

## 2024-04-09 DIAGNOSIS — Z13228 Encounter for screening for other metabolic disorders: Secondary | ICD-10-CM | POA: Diagnosis not present

## 2024-04-09 DIAGNOSIS — Z114 Encounter for screening for human immunodeficiency virus [HIV]: Secondary | ICD-10-CM

## 2024-04-09 DIAGNOSIS — Z136 Encounter for screening for cardiovascular disorders: Secondary | ICD-10-CM | POA: Diagnosis not present

## 2024-04-09 DIAGNOSIS — Z113 Encounter for screening for infections with a predominantly sexual mode of transmission: Secondary | ICD-10-CM | POA: Insufficient documentation

## 2024-04-09 DIAGNOSIS — Z0000 Encounter for general adult medical examination without abnormal findings: Secondary | ICD-10-CM

## 2024-04-09 DIAGNOSIS — Z130 Encounter for screening for diseases of the blood and blood-forming organs and certain disorders involving the immune mechanism: Secondary | ICD-10-CM

## 2024-04-09 DIAGNOSIS — Z1329 Encounter for screening for other suspected endocrine disorder: Secondary | ICD-10-CM | POA: Diagnosis not present

## 2024-04-09 DIAGNOSIS — Z1211 Encounter for screening for malignant neoplasm of colon: Secondary | ICD-10-CM

## 2024-04-09 DIAGNOSIS — Z77018 Contact with and (suspected) exposure to other hazardous metals: Secondary | ICD-10-CM

## 2024-04-09 LAB — POCT GLYCOSYLATED HEMOGLOBIN (HGB A1C): HbA1c, POC (controlled diabetic range): 6.9 % (ref 0.0–7.0)

## 2024-04-09 MED ORDER — TORSEMIDE 20 MG PO TABS
20.0000 mg | ORAL_TABLET | Freq: Every day | ORAL | 0 refills | Status: AC
  Filled 2024-04-09: qty 90, 90d supply, fill #0

## 2024-04-09 NOTE — Progress Notes (Signed)
 Established Patient Office Visit  Subjective    Patient ID: Kyle Dawson, male    DOB: [birth year]  Age: 58 y.o. MRN: [id number]  CC:  Chief Complaint  Patient presents with   Annual Exam    HPI [name] presents for routine annual exam. Patient denies acute complaints.   Outpatient Encounter Medications as of 04/09/2024  Medication Sig   apixaban  (ELIQUIS ) 5 MG TABS tablet Take 1 tablet (5 mg) by mouth 2 times daily.   cholecalciferol (VITAMIN D3) 25 MCG (1000 UNIT) tablet Take 1,000 Units by mouth daily.   empagliflozin  (JARDIANCE ) 10 MG TABS tablet Take 1 tablet (10 mg total) by mouth daily before breakfast.   glipiZIDE  (GLUCOTROL ) 5 MG tablet Take 1 tablet by mouth 2  times daily before a meal.   losartan  (COZAAR ) 50 MG tablet Take 1 tablet (50 mg total) by mouth daily.   metoprolol  succinate (TOPROL -XL) 50 MG 24 hr tablet Take 1 tablet (50 mg total) by mouth in the morning and at bedtime.   Multiple Vitamins-Minerals (MULTIVITAMIN WITH MINERALS) tablet Take 1 tablet by mouth daily.   OneTouch Delica Lancets 33G MISC USE TO CHECK BLOOD SUGAR ONCE DAILY   rosuvastatin  (CRESTOR ) 10 MG tablet Take 1 tablet (10 mg total) by mouth daily.   sildenafil  (VIAGRA ) 100 MG tablet Take 1 tablet (100 mg total) by mouth daily as needed for erectile dysfunction (Patient taking differently: Take 100 mg by mouth as needed.)   tamsulosin  (FLOMAX ) 0.4 MG CAPS capsule Take 1 capsule (0.4 mg total) by mouth daily after supper.   torsemide  (DEMADEX ) 20 MG tablet Take 1 tablet (20 mg total) by mouth daily.   No facility-administered encounter medications on file as of 04/09/2024.    Past Medical History:  Diagnosis Date   Basal cell carcinoma    Chest pain    Diabetes mellitus without complication (HCC)    Edema    lower extremity   Hypertension    Squamous cell carcinoma    R ear, nose, each side of face    Past Surgical History:  Procedure Laterality Date   ATRIAL FIBRILLATION  ABLATION N/A 04/22/2021   Procedure: ATRIAL FIBRILLATION ABLATION;  Surgeon: Inocencio Soyla Lunger, MD;  Location: [facility];  Service: Cardiovascular;  Laterality: N/A;   Removal basal cell carcinoma     Removal squamous cell carcinoma     RIGHT/LEFT HEART CATH AND CORONARY ANGIOGRAPHY N/A 12/23/2022   Procedure: RIGHT/LEFT HEART CATH AND CORONARY ANGIOGRAPHY;  Surgeon: Wendel Lurena POUR, MD;  Location: [facility];  Service: Cardiovascular;  Laterality: N/A;    Family History  Problem Relation Age of Onset   Peripheral vascular disease Mother 110   Hypertension Mother    Heart failure Mother    CAD Maternal Grandfather    Heart failure Paternal Grandfather    Hypertension Father     Social History   Socioeconomic History   Marital status: Single    Spouse name: Not on file   Number of children: 0   Years of education: ADN   Highest education level: Associate degree: academic program  Occupational History   Occupation: ER [employer]    Employer: [employer]   Occupation: [employer]  Tobacco Use   Smoking status: Never   Smokeless tobacco: Never   Tobacco comments:    Never smoked 12/27/22  Vaping Use   Vaping status: Never Used  Substance and Sexual Activity   Alcohol use: Not  Currently    Alcohol/week: 3.0 - 6.0 standard drinks of alcohol    Types: 3 - 6 Shots of liquor per week    Comment: 1-2 shots every 2-3 days 06/26/2021   Drug use: No   Sexual activity: Not on file  Other Topics Concern   Not on file  Social History Narrative   Patient drinks 1 cup of coffee a day    Social Drivers of Corporate Investment Banker Strain: Low Risk  (09/30/2023)   Overall Financial Resource Strain (CARDIA)    Difficulty of Paying Living Expenses: Not hard at all  Food Insecurity: No Food Insecurity (09/30/2023)   Hunger Vital Sign    Worried About Running Out of Food in the Last Year: Never true    Ran Out of Food in the Last Year: Never true  Transportation Needs: No  Transportation Needs (09/30/2023)   PRAPARE - Administrator, Civil Service (Medical): No    Lack of Transportation (Non-Medical): No  Physical Activity: Insufficiently Active (09/30/2023)   Exercise Vital Sign    Days of Exercise per Week: 2 days    Minutes of Exercise per Session: 30 min  Stress: Stress Concern Present (09/30/2023)   [employer] of Occupational Health - Occupational Stress Questionnaire    Feeling of Stress : To some extent  Social Connections: Unknown (09/30/2023)   Social Connection and Isolation Panel    Frequency of Communication with Friends and Family: Once a week    Frequency of Social Gatherings with Friends and Family: Twice a week    Attends Religious Services: Never    Database Administrator or Organizations: Yes    Attends Engineer, Structural: More than 4 times per year    Marital Status: Not on file  Intimate Partner Violence: Low Risk (10/04/2019)   [facility] from [facility]   Intimate Partner Violence    Insults You: Not on file    Threatens You: Not on file    Screams at You: Not on file    Physically Hurt: Not on file    Intimate Partner Violence Score: Not on file    Review of Systems  All other systems reviewed and are negative.       Objective    BP 133/76   Pulse (!) 54   Ht 5' 10 (1.778 m)   Wt 248 lb 12.8 oz (112.9 kg)   SpO2 96%   BMI 35.70 kg/m   Physical Exam Vitals and nursing note reviewed.  Constitutional:      General: He is not in acute distress. HENT:     Head: Normocephalic and atraumatic.     Right Ear: Tympanic membrane, ear canal and external ear normal.     Left Ear: Tympanic membrane, ear canal and external ear normal.     Nose: Nose normal.     Mouth/Throat:     Mouth: Mucous membranes are moist.     Pharynx: Oropharynx is clear.  Eyes:     Conjunctiva/sclera: Conjunctivae normal.     Pupils: Pupils are equal, round, and reactive to light.  Neck:     Thyroid : No  thyromegaly.  Cardiovascular:     Rate and Rhythm: Normal rate and regular rhythm.     Heart sounds: Normal heart sounds. No murmur heard. Pulmonary:     Effort: Pulmonary effort is normal.     Breath sounds: Normal breath sounds.  Abdominal:     General: There  is no distension.     Palpations: Abdomen is soft. There is no mass.     Tenderness: There is no abdominal tenderness.     Hernia: There is no hernia in the left inguinal area or right inguinal area.  Musculoskeletal:        General: Normal range of motion.     Cervical back: Normal range of motion and neck supple.     Right lower leg: No edema.     Left lower leg: No edema.  Skin:    General: Skin is warm and dry.  Neurological:     General: No focal deficit present.     Mental Status: He is alert and oriented to person, place, and time. Mental status is at baseline.  Psychiatric:        Mood and Affect: Mood normal.        Behavior: Behavior normal.         Assessment & Plan:   Annual physical exam -     Comprehensive metabolic panel with GFR  Screening for deficiency anemia -     CBC with Differential/Platelet  Encounter for screening for cardiovascular disorders -     Lipid panel  Screening for endocrine/metabolic/immunity disorders -     POCT glycosylated hemoglobin (Hb A1C) -     VITAMIN D 25 Hydroxy (Vit-D Deficiency, Fractures)  Screening for colon cancer -     Cologuard  Heavy metal exposure -     Heavy metals, blood  Screening for HIV (human immunodeficiency virus) -     HIV Antibody (routine testing w rflx)  Screening for STDs (sexually transmitted diseases) -     GC/Chlamydia probe amp ([place])not at [facility]     Return in about 6 months (around 10/08/2024) for follow up, chronic med issues.   Tanda Raguel SQUIBB, MD

## 2024-04-10 ENCOUNTER — Ambulatory Visit: Payer: Self-pay | Admitting: Family Medicine

## 2024-04-10 LAB — GC/CHLAMYDIA PROBE AMP (~~LOC~~) NOT AT ARMC
Chlamydia: NEGATIVE
Comment: NEGATIVE
Comment: NORMAL
Neisseria Gonorrhea: NEGATIVE

## 2024-05-01 ENCOUNTER — Other Ambulatory Visit (HOSPITAL_COMMUNITY): Payer: Self-pay

## 2024-05-06 LAB — COMPREHENSIVE METABOLIC PANEL WITH GFR
ALT: 22 IU/L (ref 0–44)
AST: 17 IU/L (ref 0–40)
Albumin: 4.5 g/dL (ref 3.8–4.9)
Alkaline Phosphatase: 77 IU/L (ref 47–123)
BUN/Creatinine Ratio: 22 (ref 10–24)
BUN: 23 mg/dL (ref 8–27)
Bilirubin Total: 0.6 mg/dL (ref 0.0–1.2)
CO2: 26 mmol/L (ref 20–29)
Calcium: 9.6 mg/dL (ref 8.6–10.2)
Chloride: 100 mmol/L (ref 96–106)
Creatinine, Ser: 1.05 mg/dL (ref 0.76–1.27)
Globulin, Total: 2.2 g/dL (ref 1.5–4.5)
Glucose: 290 mg/dL — ABNORMAL HIGH (ref 70–99)
Potassium: 4.2 mmol/L (ref 3.5–5.2)
Sodium: 140 mmol/L (ref 134–144)
Total Protein: 6.7 g/dL (ref 6.0–8.5)
eGFR: 81 mL/min/1.73

## 2024-05-06 LAB — CBC WITH DIFFERENTIAL/PLATELET
Basophils Absolute: 0.1 x10E3/uL (ref 0.0–0.2)
Basos: 1 %
EOS (ABSOLUTE): 0.3 x10E3/uL (ref 0.0–0.4)
Eos: 4 %
Hematocrit: 47 % (ref 37.5–51.0)
Hemoglobin: 15.7 g/dL (ref 13.0–17.7)
Immature Grans (Abs): 0.1 x10E3/uL (ref 0.0–0.1)
Immature Granulocytes: 1 %
Lymphocytes Absolute: 1.8 x10E3/uL (ref 0.7–3.1)
Lymphs: 25 %
MCH: 31 pg (ref 26.6–33.0)
MCHC: 33.4 g/dL (ref 31.5–35.7)
MCV: 93 fL (ref 79–97)
Monocytes Absolute: 0.6 x10E3/uL (ref 0.1–0.9)
Monocytes: 8 %
Neutrophils Absolute: 4.5 x10E3/uL (ref 1.4–7.0)
Neutrophils: 61 %
Platelets: 221 x10E3/uL (ref 150–450)
RBC: 5.06 x10E6/uL (ref 4.14–5.80)
RDW: 12.5 % (ref 11.6–15.4)
WBC: 7.3 x10E3/uL (ref 3.4–10.8)

## 2024-05-06 LAB — LIPID PANEL
Chol/HDL Ratio: 3.5 ratio (ref 0.0–5.0)
Cholesterol, Total: 165 mg/dL (ref 100–199)
HDL: 47 mg/dL
LDL Chol Calc (NIH): 85 mg/dL (ref 0–99)
Triglycerides: 193 mg/dL — ABNORMAL HIGH (ref 0–149)
VLDL Cholesterol Cal: 33 mg/dL (ref 5–40)

## 2024-05-06 LAB — HEAVY METALS, BLOOD
Arsenic: 3 ug/L (ref 0–9)
Lead, Blood: 1.7 ug/dL (ref 0.0–3.4)
Mercury: 3.2 ug/L (ref 0.0–14.9)

## 2024-05-06 LAB — HIV ANTIBODY (ROUTINE TESTING W REFLEX): HIV Screen 4th Generation wRfx: NONREACTIVE

## 2024-05-06 LAB — VITAMIN D 25 HYDROXY (VIT D DEFICIENCY, FRACTURES): Vit D, 25-Hydroxy: 44.5 ng/mL (ref 30.0–100.0)

## 2024-05-28 ENCOUNTER — Other Ambulatory Visit: Payer: Self-pay

## 2024-06-04 ENCOUNTER — Ambulatory Visit: Admitting: Dermatology

## 2024-06-07 ENCOUNTER — Ambulatory Visit: Admitting: Dermatology

## 2024-06-07 ENCOUNTER — Encounter: Payer: Self-pay | Admitting: Dermatology

## 2024-06-07 ENCOUNTER — Other Ambulatory Visit: Payer: Self-pay

## 2024-06-07 DIAGNOSIS — D1801 Hemangioma of skin and subcutaneous tissue: Secondary | ICD-10-CM

## 2024-06-07 DIAGNOSIS — D229 Melanocytic nevi, unspecified: Secondary | ICD-10-CM

## 2024-06-07 DIAGNOSIS — L814 Other melanin hyperpigmentation: Secondary | ICD-10-CM

## 2024-06-07 DIAGNOSIS — W908XXA Exposure to other nonionizing radiation, initial encounter: Secondary | ICD-10-CM

## 2024-06-07 DIAGNOSIS — L578 Other skin changes due to chronic exposure to nonionizing radiation: Secondary | ICD-10-CM

## 2024-06-07 DIAGNOSIS — Z1283 Encounter for screening for malignant neoplasm of skin: Secondary | ICD-10-CM

## 2024-06-07 DIAGNOSIS — L821 Other seborrheic keratosis: Secondary | ICD-10-CM | POA: Diagnosis not present

## 2024-06-07 DIAGNOSIS — L988 Other specified disorders of the skin and subcutaneous tissue: Secondary | ICD-10-CM

## 2024-06-07 DIAGNOSIS — L989 Disorder of the skin and subcutaneous tissue, unspecified: Secondary | ICD-10-CM

## 2024-06-07 DIAGNOSIS — L570 Actinic keratosis: Secondary | ICD-10-CM | POA: Diagnosis not present

## 2024-06-07 MED ORDER — MUPIROCIN 2 % EX OINT
1.0000 | TOPICAL_OINTMENT | Freq: Two times a day (BID) | CUTANEOUS | 0 refills | Status: AC
  Filled 2024-06-07: qty 22, 22d supply, fill #0

## 2024-06-07 NOTE — Patient Instructions (Addendum)

## 2024-06-07 NOTE — Progress Notes (Signed)
 "  Total Body Skin Exam (TBSE) Visit   History of Present Illness Kyle Dawson is a 59 year old male who presents for a routine skin check.  He has a new spot on his arm resembling a burn, with no recollection of any injury or burn. This spot has been present for about a week, and its origin is unknown.  He uses tea tree oil on some spots on his head, which show improvement but do not completely resolve. He has noticed a couple of persistent spots in his hair.  He reports minimal sun exposure recently, describing himself as 'pretty lame' in terms of sun-related activities.  He mentions a fall on the ice, which he attributes to needing to get out of the car frequently due to taking furosemide .  Patient presents today for follow up visit for TBSE. Patient was last evaluated on 03/01/2024 for excision of subcutaneous nodule on the left cheek.Patient denies medication changes. Patient reports he  does have spots, moles and lesions of concern to be evaluated. Patient reports throughout his lifetime he  has had moderate sun exposure. Currently, patient reports if he  has excessive sun exposure, he  does apply sunscreen and/or wears protective coverings. Patient reports he  has hx of bx. Patient admits to  family history of skin cancers.   Pt endorses the following concerns: spots on scalp that has been present for about one month and has been itchy. Pt has treated areas with tea tree oil. Spot on left arm that has been present for over a week and is not bothersome, used  bag bomb which is a mix Vaseline with eucalyptus and rosin.   The following portions of the chart were reviewed this encounter and updated as appropriate: medications, allergies, medical history  Review of Systems:  No other skin or systemic complaints except as noted in HPI or Assessment and Plan.  Objective  Well appearing patient in no apparent distress; mood and affect are within normal limits.  A full examination was  performed including scalp, head, eyes, ears, nose, lips, neck, chest, axillae, abdomen, back, buttocks, bilateral upper extremities, bilateral lower extremities, hands, feet, fingers, toes, fingernails, and toenails. All findings within normal limits unless otherwise noted below.   Relevant physical exam findings are noted in the Assessment and Plan.    Mid Parietal Scalp Erythematous thin papules/macules with gritty scale.   Assessment & Plan   LENTIGINES, SEBORRHEIC KERATOSES, HEMANGIOMAS - Benign normal skin lesions - Benign-appearing - Call for any changes  MELANOCYTIC NEVI - Tan-brown and/or pink-flesh-colored symmetric macules and papules - Benign appearing on exam today - Observation - Call clinic for new or changing moles - Recommend daily use of broad spectrum spf 30+ sunscreen to sun-exposed areas.   ACTINIC DAMAGE - Chronic condition, secondary to cumulative UV/sun exposure - diffuse scaly erythematous macules with underlying dyspigmentation - Recommend daily broad spectrum sunscreen SPF 30+ to sun-exposed areas, reapply every 2 hours as needed.  - Staying in the shade or wearing long sleeves, sun glasses (UVA+UVB protection) and wide brim hats (4-inch brim around the entire circumference of the hat) are also recommended for sun protection.  - Call for new or changing lesions.  SKIN CANCER SCREENING PERFORMED TODAY.  Superficial erosion - Left Forearm   Treatment Plan: Mupirocin  Ointment 2% - Apply Twice Daily to forearm  AK (ACTINIC KERATOSIS) Mid Parietal Scalp - Destruction of lesion - Mid Parietal Scalp Complexity: simple   Destruction method: cryotherapy  Timeout:  patient name, date of birth, surgical site, and procedure verified Lesion destroyed using liquid nitrogen: Yes   Region frozen until ice ball extended beyond lesion: Yes   Outcome: patient tolerated procedure well with no complications   Post-procedure details: wound care instructions given     SKIN EROSION   LENTIGINES   SEBORRHEIC KERATOSIS   MULTIPLE BENIGN NEVI   CHERRY ANGIOMA   Return in about 6 months (around 12/05/2024) for TBSE.  HARGIS Virgle Boards, Derm[employer] am acting as a scribe for RUFUS CHRISTELLA HOLY, MD.  Documentation: I have reviewed the above documentation for accuracy and completeness, and I agree with the above.  RUFUS CHRISTELLA HOLY, MD   "

## 2024-06-11 ENCOUNTER — Ambulatory Visit: Admitting: Dermatology

## 2024-06-12 ENCOUNTER — Other Ambulatory Visit: Payer: Self-pay | Admitting: Internal Medicine

## 2024-06-13 ENCOUNTER — Other Ambulatory Visit (HOSPITAL_COMMUNITY): Payer: Self-pay

## 2024-06-13 ENCOUNTER — Other Ambulatory Visit: Payer: Self-pay

## 2024-06-13 MED ORDER — TAMSULOSIN HCL 0.4 MG PO CAPS
0.4000 mg | ORAL_CAPSULE | Freq: Every day | ORAL | 0 refills | Status: AC
  Filled 2024-06-13: qty 90, 90d supply, fill #0

## 2024-09-19 ENCOUNTER — Ambulatory Visit: Admitting: Endocrinology

## 2024-10-08 ENCOUNTER — Ambulatory Visit: Admitting: Family Medicine

## 2024-12-03 ENCOUNTER — Ambulatory Visit: Admitting: Dermatology
